# Patient Record
Sex: Female | Born: 1950 | Race: Black or African American | Hispanic: No | Marital: Single | Smoking: Never smoker
Health system: Southern US, Community
[De-identification: ages and names within clinical notes are randomized; demographics above are authoritative.]

## PROBLEM LIST (undated history)

## (undated) DIAGNOSIS — Z1509 Genetic susceptibility to other malignant neoplasm: Secondary | ICD-10-CM

## (undated) DIAGNOSIS — D649 Anemia, unspecified: Secondary | ICD-10-CM

## (undated) DIAGNOSIS — Z1501 Genetic susceptibility to malignant neoplasm of breast: Secondary | ICD-10-CM

## (undated) DIAGNOSIS — C50919 Malignant neoplasm of unspecified site of unspecified female breast: Secondary | ICD-10-CM

## (undated) DIAGNOSIS — E039 Hypothyroidism, unspecified: Secondary | ICD-10-CM

## (undated) DIAGNOSIS — C569 Malignant neoplasm of unspecified ovary: Secondary | ICD-10-CM

## (undated) DIAGNOSIS — I10 Essential (primary) hypertension: Secondary | ICD-10-CM

## (undated) HISTORY — DX: Malignant neoplasm of unspecified site of unspecified female breast: C50.919

## (undated) HISTORY — DX: Genetic susceptibility to malignant neoplasm of breast: Z15.09

## (undated) HISTORY — DX: Essential (primary) hypertension: I10

## (undated) HISTORY — DX: Genetic susceptibility to malignant neoplasm of breast: Z15.01

## (undated) HISTORY — PX: TUBAL LIGATION: SHX77

---

## 2002-07-21 DIAGNOSIS — C50919 Malignant neoplasm of unspecified site of unspecified female breast: Secondary | ICD-10-CM

## 2002-07-21 HISTORY — PX: OTHER SURGICAL HISTORY: SHX169

## 2002-07-21 HISTORY — DX: Malignant neoplasm of unspecified site of unspecified female breast: C50.919

## 2003-07-22 HISTORY — PX: PARATHYROIDECTOMY: SHX19

## 2003-12-29 ENCOUNTER — Encounter (HOSPITAL_COMMUNITY): Admission: RE | Admit: 2003-12-29 | Discharge: 2004-03-28 | Payer: Self-pay | Admitting: Hematology and Oncology

## 2004-10-11 ENCOUNTER — Ambulatory Visit: Admission: RE | Admit: 2004-10-11 | Discharge: 2005-01-09 | Payer: Self-pay | Admitting: Radiation Oncology

## 2005-03-06 ENCOUNTER — Ambulatory Visit: Payer: Self-pay | Admitting: Cardiology

## 2005-05-21 ENCOUNTER — Ambulatory Visit: Payer: Self-pay | Admitting: Cardiology

## 2006-03-27 ENCOUNTER — Encounter: Admission: RE | Admit: 2006-03-27 | Discharge: 2006-03-27 | Payer: Self-pay | Admitting: General Surgery

## 2006-08-17 ENCOUNTER — Ambulatory Visit (HOSPITAL_COMMUNITY): Admission: RE | Admit: 2006-08-17 | Discharge: 2006-08-18 | Payer: Self-pay | Admitting: General Surgery

## 2011-10-21 ENCOUNTER — Encounter: Payer: Self-pay | Admitting: Gastroenterology

## 2011-11-17 ENCOUNTER — Encounter: Payer: Self-pay | Admitting: Gastroenterology

## 2011-11-17 ENCOUNTER — Ambulatory Visit (AMBULATORY_SURGERY_CENTER): Payer: BC Managed Care – PPO | Admitting: *Deleted

## 2011-11-17 VITALS — Ht 62.0 in | Wt 152.3 lb

## 2011-11-17 DIAGNOSIS — Z1211 Encounter for screening for malignant neoplasm of colon: Secondary | ICD-10-CM

## 2011-11-17 MED ORDER — PEG-KCL-NACL-NASULF-NA ASC-C 100 G PO SOLR: ORAL | Status: DC

## 2011-12-01 ENCOUNTER — Ambulatory Visit (AMBULATORY_SURGERY_CENTER): Payer: BC Managed Care – PPO | Admitting: Gastroenterology

## 2011-12-01 ENCOUNTER — Encounter: Payer: Self-pay | Admitting: Gastroenterology

## 2011-12-01 VITALS — BP 164/47 | HR 55 | Temp 96.5°F | Resp 16 | Ht 62.0 in | Wt 152.0 lb

## 2011-12-01 DIAGNOSIS — Z1211 Encounter for screening for malignant neoplasm of colon: Secondary | ICD-10-CM

## 2011-12-01 DIAGNOSIS — D126 Benign neoplasm of colon, unspecified: Secondary | ICD-10-CM

## 2011-12-01 DIAGNOSIS — D129 Benign neoplasm of anus and anal canal: Secondary | ICD-10-CM

## 2011-12-01 DIAGNOSIS — D128 Benign neoplasm of rectum: Secondary | ICD-10-CM

## 2011-12-01 MED ORDER — SODIUM CHLORIDE 0.9 % IV SOLN: 500.0000 mL | INTRAVENOUS | Status: DC

## 2011-12-01 NOTE — Progress Notes (Signed)
Patient did not experience any of the following events: a burn prior to discharge; a fall within the facility; wrong site/side/patient/procedure/implant event; or a hospital transfer or hospital admission upon discharge from the facility. (G8907) Patient did not have preoperative order for IV antibiotic SSI prophylaxis. (G8918)  

## 2011-12-01 NOTE — Patient Instructions (Signed)
YOU HAD AN ENDOSCOPIC PROCEDURE TODAY AT THE [facility]: Refer to the procedure report that was given to you for any specific questions about what was found during the examination.  If the procedure report does not answer your questions, please call your gastroenterologist to clarify.  If you requested that your care partner not be given the details of your procedure findings, then the procedure report has been included in a sealed envelope for you to review at your convenience later.  YOU SHOULD EXPECT: Some feelings of bloating in the abdomen. Passage of more gas than usual.  Walking can help get rid of the air that was put into your GI tract during the procedure and reduce the bloating. If you had a lower endoscopy (such as a colonoscopy or flexible sigmoidoscopy) you may notice spotting of blood in your stool or on the toilet paper. If you underwent a bowel prep for your procedure, then you may not have a normal bowel movement for a few days.  DIET: Your first meal following the procedure should be a light meal and then it is ok to progress to your normal diet.  A half-sandwich or bowl of soup is an example of a good first meal.  Heavy or fried foods are harder to digest and may make you feel nauseous or bloated.  Likewise meals heavy in dairy and vegetables can cause extra gas to form and this can also increase the bloating.  Drink plenty of fluids but you should avoid alcoholic beverages for 24 hours.  ACTIVITY: Your care partner should take you home directly after the procedure.  You should plan to take it easy, moving slowly for the rest of the day.  You can resume normal activity the day after the procedure however you should NOT DRIVE or use heavy machinery for 24 hours (because of the sedation medicines used during the test).    SYMPTOMS TO REPORT IMMEDIATELY: A gastroenterologist can be reached at any hour.  During normal business hours, 8:30 AM to 5:00 PM Monday through Friday,  call [phone].  After hours and on weekends, please call the GI answering service at [phone] who will take a message and have the physician on call contact you.   Following lower endoscopy (colonoscopy or flexible sigmoidoscopy):  Excessive amounts of blood in the stool  Significant tenderness or worsening of abdominal pains  Swelling of the abdomen that is new, acute  Fever of 100F or higher  Following upper endoscopy (EGD)  Vomiting of blood or coffee ground material  New chest pain or pain under the shoulder blades  Painful or persistently difficult swallowing  New shortness of breath  Fever of 100F or higher  Black, tarry-looking stools  FOLLOW UP: If any biopsies were taken you will be contacted by phone or by letter within the next 1-3 weeks.  Call your gastroenterologist if you have not heard about the biopsies in 3 weeks.  Our staff will call the home number listed on your records the next business day following your procedure to check on you and address any questions or concerns that you may have at that time regarding the information given to you following your procedure. This is a courtesy call and so if there is no answer at the home number and we have not heard from you through the emergency physician on call, we will assume that you have returned to your regular daily activities without incident.  SIGNATURES/CONFIDENTIALITY: You and/or your care   partner have signed paperwork which will be entered into your electronic medical record.  These signatures attest to the fact that that the information above on your After Visit Summary has been reviewed and is understood.  Full responsibility of the confidentiality of this discharge information lies with you and/or your care-partner.   HANDOUT ON POLYPS 

## 2011-12-01 NOTE — Op Note (Signed)
[facility] [address] [place], [place]  [address]  COLONOSCOPY PROCEDURE REPORT  PATIENT:  Kathleen, Bond  MR#:  [id number] BIRTHDATE:  [birth year], 60 yrs. old  GENDER:  female ENDOSCOPIST:  Vania Rea. Jarold Motto, MD, [facility] REF. BY:  Colon Branch, M.D. PROCEDURE DATE:  12/01/2011 PROCEDURE:  Colonoscopy with biopsy ASA CLASS:  Class II INDICATIONS:  Routine Risk Screening MEDICATIONS:   propofol (Diprivan) 200 mg IV  DESCRIPTION OF PROCEDURE:   After the risks and benefits and of the procedure were explained, informed consent was obtained. Digital rectal exam was performed and revealed no abnormalities. The LB CF-[id number] endoscope was introduced through the anus and advanced to the cecum, which was identified by both the appendix and ileocecal valve.  The quality of the prep was excellent, using MoviPrep.  The instrument was then slowly withdrawn as the colon was fully examined. <<PROCEDUREIMAGES>>  FINDINGS:  A diminutive polyp was found in the rectum. COLD BIOPSY OF A SMALL RECTAL NODULEMDONE.  This was otherwise a normal examination of the colon.   Retroflexed views in the rectum revealed no abnormalities.    The scope was then withdrawn from the patient and the procedure completed.  COMPLICATIONS:  None ENDOSCOPIC IMPRESSION: 1) Diminutive polyp in the rectum 2) Otherwise normal examination R/O ADENOMA RECOMMENDATIONS: 1) Repeat colonoscopy in 5 years if polyp adenomatous; otherwise 10 years  REPEAT EXAM:  No  ______________________________ Vania Rea. Jarold Motto, MD, [facility]  CC:  n. eSIGNED:   Vania Rea. Queen Abbett at 12/01/2011 11:08 AM  Anette Riedel, [phone]

## 2011-12-02 ENCOUNTER — Telehealth: Payer: Self-pay | Admitting: *Deleted

## 2011-12-02 NOTE — Telephone Encounter (Signed)
  Follow up Call-  Call back number 12/01/2011  Post procedure Call Back phone  # [phone]  Permission to leave phone message Yes     Patient questions:  Do you have a fever, pain , or abdominal swelling? no Pain Score  0 *  Have you tolerated food without any problems? yes  Have you been able to return to your normal activities? yes  Do you have any questions about your discharge instructions: Diet   no Medications  no Follow up visit  no  Do you have questions or concerns about your Care? no  Actions: * If pain score is 4 or above: No action needed, pain <4.

## 2011-12-08 ENCOUNTER — Encounter: Payer: Self-pay | Admitting: Gastroenterology

## 2012-02-10 ENCOUNTER — Encounter: Payer: BC Managed Care – PPO | Admitting: Hematology and Oncology

## 2012-03-26 ENCOUNTER — Encounter (INDEPENDENT_AMBULATORY_CARE_PROVIDER_SITE_OTHER): Payer: BC Managed Care – PPO | Admitting: Hematology and Oncology

## 2012-03-26 DIAGNOSIS — I10 Essential (primary) hypertension: Secondary | ICD-10-CM

## 2012-03-26 DIAGNOSIS — C50419 Malignant neoplasm of upper-outer quadrant of unspecified female breast: Secondary | ICD-10-CM

## 2012-03-26 DIAGNOSIS — E213 Hyperparathyroidism, unspecified: Secondary | ICD-10-CM

## 2012-03-26 DIAGNOSIS — Z171 Estrogen receptor negative status [ER-]: Secondary | ICD-10-CM

## 2014-07-25 ENCOUNTER — Ambulatory Visit: Payer: 59 | Attending: Gynecologic Oncology | Admitting: Gynecologic Oncology

## 2014-07-25 ENCOUNTER — Encounter: Payer: Self-pay | Admitting: Gynecologic Oncology

## 2014-07-25 VITALS — BP 148/91 | HR 100 | Temp 98.1°F | Resp 18 | Ht 62.0 in | Wt 129.2 lb

## 2014-07-25 DIAGNOSIS — R599 Enlarged lymph nodes, unspecified: Secondary | ICD-10-CM | POA: Diagnosis not present

## 2014-07-25 DIAGNOSIS — R1900 Intra-abdominal and pelvic swelling, mass and lump, unspecified site: Secondary | ICD-10-CM | POA: Insufficient documentation

## 2014-07-25 DIAGNOSIS — J90 Pleural effusion, not elsewhere classified: Secondary | ICD-10-CM | POA: Insufficient documentation

## 2014-07-25 DIAGNOSIS — Z853 Personal history of malignant neoplasm of breast: Secondary | ICD-10-CM | POA: Insufficient documentation

## 2014-07-25 DIAGNOSIS — I10 Essential (primary) hypertension: Secondary | ICD-10-CM | POA: Diagnosis not present

## 2014-07-25 DIAGNOSIS — Z79899 Other long term (current) drug therapy: Secondary | ICD-10-CM | POA: Diagnosis not present

## 2014-07-25 DIAGNOSIS — R6881 Early satiety: Secondary | ICD-10-CM | POA: Insufficient documentation

## 2014-07-25 DIAGNOSIS — R180 Malignant ascites: Secondary | ICD-10-CM | POA: Insufficient documentation

## 2014-07-25 DIAGNOSIS — R188 Other ascites: Secondary | ICD-10-CM | POA: Diagnosis present

## 2014-07-25 DIAGNOSIS — K5900 Constipation, unspecified: Secondary | ICD-10-CM | POA: Diagnosis not present

## 2014-07-25 DIAGNOSIS — R140 Abdominal distension (gaseous): Secondary | ICD-10-CM | POA: Insufficient documentation

## 2014-07-25 DIAGNOSIS — E0590 Thyrotoxicosis, unspecified without thyrotoxic crisis or storm: Secondary | ICD-10-CM | POA: Diagnosis not present

## 2014-07-25 NOTE — Progress Notes (Signed)
Consult Note: Gyn-Onc  Consult was requested by Dr. Eula Fried for the evaluation of Kathleen Bond 64 y.o. female with ascites, pelvic mass and signs concerning for stage IV ovarian cancer.  CC:  Chief Complaint  Patient presents with  . pelvic mass    Assessment/Plan:  Ms. Kathleen Bond  is a 64 y.o.  year old with apparent advanced stage (IV) ovarian cancer. Her imaging is consistent with a stage IIIC or 4 disease with omental caking, large pelvic mass, para-aortic lymphadenopathy, pleural effusions and ascites, and a right 3 mm subpleural nodule. She is otherwise a very healthy woman with good nutritional status and excellent performance status. Of note she lives with her 75 year old blind mother. However she reports good social support.  I had a long discussion with Kathleen Bond regarding the nature of ovarian cancer, and recommended therapies. I discussed the importance of a combination therapy including both chemotherapy and surgery. Given the distribution of disease on imaging, and her excellent performance status, I believe she is a good candidate for upfront surgical debulking with the goal being to resect to microscopic disease or optimal disease (less than 1 cm). Postoperatively, if pathology confirms epithelial ovarian cancer, she would require adjuvant chemotherapy in the form of either IV or IP administration. She expressed understanding of this therapeutic course.  I discussed the anticipated surgery. I'm recommending an exploratory laparotomy, TAH, BSO, omentectomy, lymphadenectomy and tumor cytoreductive surgery. Based on the CT images and my physical examination I have a high degree of concern regarding rectal involvement. I discussed with Kathleen Bond the possibility of a low anterior resection of the sigmoid colon and rectum. I discussed that in most cases we are able to achieve a primary reanastomosis without formation of a GI stoma. However I discussed the possibility of seroma formation for  protection of an anastomosis if surgically it is the most prudent course. I discussed operative risks including  bleeding, infection, damage to internal organs (such as bladder,ureters, bowels), blood clot, reoperation and rehospitalization. If bowel surgery is necessary these risks are increased. I discussed that preoperative optimization with protein and high calorie supplemental drinks has been demonstrated to show a reduction in perioperative complications and however recommended that she take these in the preoperative period. Her albumin is within normal range at 3.6 as drawn on 07/21/2014.  I have ordered a paracentesis to relieve her symptoms of abdominal distention prior to surgery. We will send this fluid for cytology.  Given her personal history of breast cancer and now a likely GYN malignancy she is at high risk for carrying a deleterious mutation on BRCA and I am recommending that she undergo genetic screening for this. This can be delayed until the postoperative period during adjuvant therapy.  HPI: Kathleen Bond is a very pleasant 64 year old gravida 2 para 2 who is seen in consultation at the request of Dr.Qureshi for apparent advanced stage ovarian cancer. The patient was seen in the emergency room at [facility] on 07/21/2014 for symptoms of abdominal bloating, early satiety, pelvic pressure. She also has intermittent constipation and narrowing of stool caliber. She's noticed the symptoms becoming progressively worse since Thanksgiving 2015. A CT scan of the abdomen and pelvis was ordered at [facility] at this ER visit and demonstrated large volume ascites, small bilateral pleural effusions, a 3 mm subpleural nodule at the right lung base. Additionally there was a large omental cake, and retroperitoneal adenopathy in the para-aortic region with larger nodes on the left than the  right and the largest measuring 11 x 14 mm. There was a an elongated soft tissue mass  extending from the small bowel mesentery to the left upper quadrant. Additionally in the pelvis a large multicystic central pelvic mass abutting the sigmoid colon was seen. These findings were most consistent with advanced ovarian cancer.   Interval History: She denies rectal bleeding however does report intermittent constipation. She's been attempting to optimize her nutrition since receiving her diagnosis.  Current Meds:  Outpatient Encounter Prescriptions as of 07/25/2014  Medication Sig  . Calcium Carbonate-Vitamin D (CALCIUM + D PO) Take by mouth daily.  . furosemide (LASIX) 20 MG tablet Take 5 mg by mouth daily.  Marland Kitchen levothyroxine (SYNTHROID, LEVOTHROID) 125 MCG tablet Take 150 mcg by mouth daily.     Allergy: No Known Allergies  Social Hx:   History   Social History  . Marital Status: Divorced    Spouse Name: N/A    Number of Children: N/A  . Years of Education: N/A   Occupational History  . Not on file.   Social History Main Topics  . Smoking status: Never Smoker   . Smokeless tobacco: Never Used  . Alcohol Use: 4.2 oz/week    7 Cans of beer per week  . Drug Use: No  . Sexual Activity: Not on file   Other Topics Concern  . Not on file   Social History Narrative    Past Surgical Hx:  Past Surgical History  Procedure Laterality Date  . Left breast lumpectomy  2004  . Parathyroidectomy  2005  . Tubal ligation Bilateral     Past Medical Hx:  Past Medical History  Diagnosis Date  . Arthritis   . Hypertension   . Breast cancer 2004    left breast lumpectomy  . Hyperthyroidism     Past Gynecological History:  G2P2, SVD  No LMP recorded. Patient is postmenopausal.  Family Hx:  Family History  Problem Relation Age of Onset  . Colon cancer Neg Hx   . Stomach cancer Neg Hx     Review of Systems:  Constitutional  Feels well,    ENT Normal appearing ears and nares bilaterally Skin/Breast  No rash, sores, jaundice, itching, dryness Cardiovascular  No  chest pain, shortness of breath, or edema  Pulmonary  No cough or wheeze.  Gastro Intestinal  No nausea, vomitting, or diarrhoea. No bright red blood per rectum, no abdominal pain. +change in bowel movement. +constipation.  + early satiety. Genito Urinary  No frequency, urgency, dysuria, see HPI Musculo Skeletal  No myalgia, arthralgia, joint swelling or pain  Neurologic  No weakness, numbness, change in gait,  Psychology  No depression, anxiety, insomnia.   Vitals:  Blood pressure 148/91, pulse 100, temperature 98.1 F (36.7 C), temperature source Oral, resp. rate 18, height _0  (1.575 m), weight 129 lb 3.2 oz (58.605 kg).  Physical Exam: WD in NAD Neck  Supple NROM, without any enlargements.  Lymph Node Survey No cervical supraclavicular or inguinal adenopathy Cardiovascular  Pulse normal rate, regularity and rhythm. S1 and S2 normal.  Lungs  Clear to auscultation bilateraly, without wheezes/crackles/rhonchi. Decreased breath sounds bibasally consistent with pleural effusions. Skin  No rash/lesions/breakdown  Psychiatry  Alert and oriented to person, place, and time  Abdomen  Normoactive bowel sounds, abdomen soft, distended with fluid wave, non-tender and nonobese without evidence of hernia.  Back No CVA tenderness Genito Urinary  Vulva/vagina: Normal external female genitalia.  No lesions. No discharge or bleeding.  Bladder/urethra:  No lesions or masses, well supported bladder  Vagina: normal and atrophic in appearance.  Cervix: Normal appearing, no lesions.  Uterus: Small, mobile, cul de sac nodularity and fluid appreciated.  Adnexa: bilateral nodular masses. Rectal  Good tone, + cul de sac nodularity and close approximation between tumor and anterior rectal wall.  Extremities  No bilateral cyanosis, clubbing or edema.   [name], MD   07/25/2014, 2:55 PM

## 2014-07-25 NOTE — Patient Instructions (Addendum)
Preparing for your Surgery  Plan for surgery on Jan 19 with Dr. Denman George.  Pre-operative Testing -You will receive a phone call from presurgical testing at [facility] to arrange for a pre-operative testing appointment before your surgery.  This appointment normally occurs one to two weeks before your scheduled surgery.   -Bring your insurance card, copy of an advanced directive if applicable, medication list  -At that visit, you will be asked to sign a consent for a possible blood transfusion in case a transfusion becomes necessary during surgery.  The need for a blood transfusion is rare but having consent is a necessary part of your care.     -You should not be taking blood thinners or aspirin at least ten days prior to surgery unless instructed by your surgeon.  Day Before Surgery at [facility] will be asked to take in only clear liquids the day before surgery.  Examples of clear liquids include broths, jello, and clear juices.  Drink one bottle of magnesium citrate the evening before surgery. You will be advised to have nothing to eat or drink after midnight the evening before.    Your role in recovery Your role is to become active as soon as directed by your doctor, while still giving yourself time to heal.  Rest when you feel tired. You will be asked to do the following in order to speed your recovery:  - Cough and breathe deeply. This helps toclear and expand your lungs and can prevent pneumonia. You may be given a spirometer to practice deep breathing. A staff member will show you how to use the spirometer. - Do mild physical activity. Walking or moving your legs help your circulation and body functions return to normal. A staff member will help you when you try to walk and will provide you with simple exercises. Do not try to get up or walk alone the first time. - Actively manage your pain. Managing your pain lets you move in comfort. We will ask you to rate your  pain on a scale of zero to 10. It is your responsibility to tell your doctor or nurse where and how much you hurt so your pain can be treated.  Special Considerations -If you are diabetic, you may be placed on insulin after surgery to have closer control over your blood sugars to promote healing and recovery.  This does not mean that you will be discharged on insulin.  If applicable, your oral antidiabetics will be resumed when you are tolerating a solid diet.  -Your final pathology results from surgery should be available by the Friday after surgery and the results will be relayed to you when available.  Paracentesis Thursday 07/27/14 at [facility] (main hospital) at 9:45 am.   Paracentesis is a procedure used to remove excess fluid from the belly (abdomen). Excess fluid in the belly is called ascites. Excess fluid can be the result of certain conditions, such as infection, inflammation, abdominal injury, heart failure, chronic scarring of the liver (cirrhosis), or cancer. The excess fluid is removed using a needle inserted through the skin and tissue into the abdomen.  A paracentesis may be done to:  Determine the cause of the excess fluid through examination of the fluid.  Relieve symptoms of shortness of breath or pain caused by the excess fluid.  Determine presence of bleeding after an abdominal injury. LET YOUR CAREGIVERS KNOW ABOUT:  Allergies.  Medications taken including herbs, eye drops, over-the-counter medications, and creams.  Use of steroids (by mouth or creams).  Previous problems with anesthetics or numbing medicine.  Possibility of pregnancy, if this applies.  History of blood clots (thrombophlebitis).  History of bleeding or blood problems.  Previous surgery.  Other health problems. RISKS AND COMPLICATIONS  Injury to an abdominal organ, such as the bowel (large intestine), liver, spleen, or bladder.  Possible infection.  Bleeding.  Low blood pressure  (hypotension). BEFORE THE PROCEDURE This is a procedure that can be done as an outpatient. Confirm the time that you need to arrive for your procedure. A blood sample may be done to determine your blood clotting time. The presence of a severe bleeding disorder (coagulopathy) which cannot be promptly corrected may make this procedure inadvisable. You may be asked to urinate. PROCEDURE The procedure will take about 30 minutes. This time will vary depending on the amount of fluid that is removed. You may be asked to lie on your back with your head elevated. An area on your abdomen will be cleansed. A numbing medicine may then be injected (local anesthesia) into the skin and tissue. A needle is inserted through your abdominal skin and tissues until it is positioned in your abdomen. You may feel pressure or slight pain as the needle is positioned into the abdomen. Fluid is removed from the abdomen through the needle. Tell your caregiver if you feel dizzy or lightheaded. The needle is withdrawn once the desired amount of fluid has been removed. A sample of the fluid may be sent for examination.  AFTER THE PROCEDURE Your recovery will be assessed and monitored. If there are no problems, as an outpatient, you should be able to go home shortly after the procedure. There may be a very limited amount of clear fluid draining from the needle insertion site over the next 2 days. Confirm with your caregiver as to the expected amount of drainage. Obtaining the Test Results It is your responsibility to obtain your test results. Do not assume everything is normal if you have not heard from your caregiver or the medical facility. It is important for you to follow up on all of your test results. HOME CARE INSTRUCTIONS   You may resume normal diet and activities as directed or allowed.  Only take over-the-counter or prescription medicines for pain, discomfort, or fever as directed by your caregiver. SEEK IMMEDIATE MEDICAL  CARE IF:  You develop shortness of breath or chest pain.  You develop increasing pain, discomfort, or swelling in your abdomen.  You develop new drainage or pus coming from site where fluid was removed.  You develop swelling or increased redness from site where fluid was removed.  You develop an unexplained temperature of 102 F (38.9 C) or above. Document Released: 01/20/2005 Document Revised: 09/29/2011 Document Reviewed: 02/26/2009 [facility] Patient Information 2015 [place], [place]. This information is not intended to replace advice given to you by your health care provider. Make sure you discuss any questions you have with your health care provider.

## 2014-07-27 ENCOUNTER — Ambulatory Visit (HOSPITAL_COMMUNITY)
Admission: RE | Admit: 2014-07-27 | Discharge: 2014-07-27 | Disposition: A | Discharge status: Home / Self Care | Payer: 59 | Source: Ambulatory Visit | Attending: Gynecologic Oncology | Admitting: Gynecologic Oncology

## 2014-07-27 DIAGNOSIS — R1900 Intra-abdominal and pelvic swelling, mass and lump, unspecified site: Secondary | ICD-10-CM | POA: Diagnosis present

## 2014-07-27 DIAGNOSIS — K668 Other specified disorders of peritoneum: Secondary | ICD-10-CM | POA: Insufficient documentation

## 2014-07-27 DIAGNOSIS — R140 Abdominal distension (gaseous): Secondary | ICD-10-CM | POA: Insufficient documentation

## 2014-07-27 DIAGNOSIS — R599 Enlarged lymph nodes, unspecified: Secondary | ICD-10-CM | POA: Insufficient documentation

## 2014-07-27 DIAGNOSIS — R188 Other ascites: Secondary | ICD-10-CM | POA: Insufficient documentation

## 2014-07-27 NOTE — Progress Notes (Signed)
Please put orders in Epic surgery 08-08-14 pre op 08-02-14 Thanks

## 2014-07-27 NOTE — Procedures (Signed)
US guided diagnostic/therapeutic paracentesis performed yielding 3.4 liters yellow fluid. A portion of the fluid was sent to the lab for cytology. No immediate complications.

## 2014-08-01 ENCOUNTER — Encounter (HOSPITAL_COMMUNITY): Payer: Self-pay

## 2014-08-01 NOTE — Patient Instructions (Addendum)
Kathleen Bond  08/01/2014   Your procedure is scheduled on: 08/08/2014    Report to [facility] Main  Entrance and follow signs to               [place] at       1045 AM.  Call this number if you have problems the morning of surgery [phone]   Remember: Clear liquid diet 24 hours prior to surgery beginning 08/07/2014 am.  One bottle of Mag Citrate the afternoon before surgery.    Do not eat food or drink liquids :After Midnight.     Take these medicines the morning of surgery with A SIP OF WATER: synthroid                                You may not have any metal on your body including hair pins and              piercings  Do not wear jewelry, make-up, lotions, powders or perfumes.             Do not wear nail polish.  Do not shave  48 hours prior to surgery.              Do not bring valuables to the hospital. [place].  Contacts, dentures or bridgework may not be worn into surgery.  Leave suitcase in the car. After surgery it may be brought to your room.         Special Instructions: coughing and deep breathing exercises, leg exercises               Please read over the following fact sheets you were given: _____________________________________________________________________                CLEAR LIQUID DIET   Foods Allowed                                                                     Foods Excluded  Coffee and tea, regular and decaf                             liquids that you cannot  Plain Jell-O in any flavor                                             see through such as: Fruit ices (not with fruit pulp)                                     milk, soups, orange juice  Iced Popsicles  All solid food Carbonated beverages, regular and diet                                    Cranberry, grape and apple juices Sports drinks like Gatorade Lightly  seasoned clear broth or consume(fat free) Sugar, honey syrup  Sample Menu Breakfast                                Lunch                                     Supper Cranberry juice                    Beef broth                            Chicken broth Jell-O                                     Grape juice                           Apple juice Coffee or tea                        Jell-O                                      Popsicle                                                Coffee or tea                        Coffee or tea  _____________________________________________________________________  [facility] - Preparing for Surgery Before surgery, you can play an important role.  Because skin is not sterile, your skin needs to be as free of germs as possible.  You can reduce the number of germs on your skin by washing with CHG (chlorahexidine gluconate) soap before surgery.  CHG is an antiseptic cleaner which kills germs and bonds with the skin to continue killing germs even after washing. Please DO NOT use if you have an allergy to CHG or antibacterial soaps.  If your skin becomes reddened/irritated stop using the CHG and inform your nurse when you arrive at Short Stay. Do not shave (including legs and underarms) for at least 48 hours prior to the first CHG shower.  You may shave your face/neck. Please follow these instructions carefully:  1.  Shower with CHG Soap the night before surgery and the  morning of Surgery.  2.  If you choose to wash your hair, wash your hair first as usual with your  normal  shampoo.  3.  After you shampoo, rinse your hair and body thoroughly to remove the  shampoo.  4.  Use CHG as you would any other liquid soap.  You can apply chg directly  to the skin and wash                       Gently with a scrungie or clean washcloth.  5.  Apply the CHG Soap to your body ONLY FROM THE NECK DOWN.   Do not use on face/ open                            Wound or open sores. Avoid contact with eyes, ears mouth and genitals (private parts).                       Wash face,  Genitals (private parts) with your normal soap.             6.  Wash thoroughly, paying special attention to the area where your surgery  will be performed.  7.  Thoroughly rinse your body with warm water from the neck down.  8.  DO NOT shower/wash with your normal soap after using and rinsing off  the CHG Soap.                9.  Pat yourself dry with a clean towel.            10.  Wear clean pajamas.            11.  Place clean sheets on your bed the night of your first shower and do not  sleep with pets. Day of Surgery : Do not apply any lotions/deodorants the morning of surgery.  Please wear clean clothes to the hospital/surgery center.  FAILURE TO FOLLOW THESE INSTRUCTIONS MAY RESULT IN THE CANCELLATION OF YOUR SURGERY PATIENT SIGNATURE_________________________________  NURSE SIGNATURE__________________________________  ________________________________________________________________________  WHAT IS A BLOOD TRANSFUSION? Blood Transfusion Information  A transfusion is the replacement of blood or some of its parts. Blood is made up of multiple cells which provide different functions.  Red blood cells carry oxygen and are used for blood loss replacement.  White blood cells fight against infection.  Platelets control bleeding.  Plasma helps clot blood.  Other blood products are available for specialized needs, such as hemophilia or other clotting disorders. BEFORE THE TRANSFUSION  Who gives blood for transfusions?   Healthy volunteers who are fully evaluated to make sure their blood is safe. This is blood bank blood. Transfusion therapy is the safest it has ever been in the practice of medicine. Before blood is taken from a donor, a complete history is taken to make sure that person has no history of diseases nor engages in risky social behavior (examples are  intravenous drug use or sexual activity with multiple partners). The donor's travel history is screened to minimize risk of transmitting infections, such as malaria. The donated blood is tested for signs of infectious diseases, such as HIV and hepatitis. The blood is then tested to be sure it is compatible with you in order to minimize the chance of a transfusion reaction. If you or a relative donates blood, this is often done in anticipation of surgery and is not appropriate for emergency situations. It takes many days to process the donated blood. RISKS AND COMPLICATIONS Although transfusion therapy is very safe and saves many lives, the main dangers of transfusion include:  1. Getting an infectious disease. 2. Developing a transfusion reaction.  This is an allergic reaction to something in the blood you were given. Every precaution is taken to prevent this. The decision to have a blood transfusion has been considered carefully by your caregiver before blood is given. Blood is not given unless the benefits outweigh the risks. AFTER THE TRANSFUSION  Right after receiving a blood transfusion, you will usually feel much better and more energetic. This is especially true if your red blood cells have gotten low (anemic). The transfusion raises the level of the red blood cells which carry oxygen, and this usually causes an energy increase.  The nurse administering the transfusion will monitor you carefully for complications. HOME CARE INSTRUCTIONS  No special instructions are needed after a transfusion. You may find your energy is better. Speak with your caregiver about any limitations on activity for underlying diseases you may have. SEEK MEDICAL CARE IF:   Your condition is not improving after your transfusion.  You develop redness or irritation at the intravenous (IV) site. SEEK IMMEDIATE MEDICAL CARE IF:  Any of the following symptoms occur over the next 12 hours:  Shaking chills.  You have a  temperature by mouth above 102 F (38.9 C), not controlled by medicine.  Chest, back, or muscle pain.  People around you feel you are not acting correctly or are confused.  Shortness of breath or difficulty breathing.  Dizziness and fainting.  You get a rash or develop hives.  You have a decrease in urine output.  Your urine turns a dark color or changes to pink, red, or brown. Any of the following symptoms occur over the next 10 days:  You have a temperature by mouth above 102 F (38.9 C), not controlled by medicine.  Shortness of breath.  Weakness after normal activity.  The white part of the eye turns yellow (jaundice).  You have a decrease in the amount of urine or are urinating less often.  Your urine turns a dark color or changes to pink, red, or brown. Document Released: 07/04/2000 Document Revised: 09/29/2011 Document Reviewed: 02/21/2008 ExitCare Patient Information 2014 [place].  _______________________________________________________________________  Incentive Spirometer  An incentive spirometer is a tool that can help keep your lungs clear and active. This tool measures how well you are filling your lungs with each breath. Taking long deep breaths may help reverse or decrease the chance of developing breathing (pulmonary) problems (especially infection) following:  A long period of time when you are unable to move or be active. BEFORE THE PROCEDURE   If the spirometer includes an indicator to show your best effort, your nurse or respiratory therapist will set it to a desired goal.  If possible, sit up straight or lean slightly forward. Try not to slouch.  Hold the incentive spirometer in an upright position. INSTRUCTIONS FOR USE  3. Sit on the edge of your bed if possible, or sit up as far as you can in bed or on a chair. 4. Hold the incentive spirometer in an upright position. 5. Breathe out normally. 6. Place the mouthpiece in your mouth and  seal your lips tightly around it. 7. Breathe in slowly and as deeply as possible, raising the piston or the ball toward the top of the column. 8. Hold your breath for 3-5 seconds or for as long as possible. Allow the piston or ball to fall to the bottom of the column. 9. Remove the mouthpiece from your mouth and breathe out normally. 10. Rest for a few seconds and repeat Steps  1 through 7 at least 10 times every 1-2 hours when you are awake. Take your time and take a few normal breaths between deep breaths. 11. The spirometer may include an indicator to show your best effort. Use the indicator as a goal to work toward during each repetition. 12. After each set of 10 deep breaths, practice coughing to be sure your lungs are clear. If you have an incision (the cut made at the time of surgery), support your incision when coughing by placing a pillow or rolled up towels firmly against it. Once you are able to get out of bed, walk around indoors and cough well. You may stop using the incentive spirometer when instructed by your caregiver.  RISKS AND COMPLICATIONS  Take your time so you do not get dizzy or light-headed.  If you are in pain, you may need to take or ask for pain medication before doing incentive spirometry. It is harder to take a deep breath if you are having pain. AFTER USE  Rest and breathe slowly and easily.  It can be helpful to keep track of a log of your progress. Your caregiver can provide you with a simple table to help with this. If you are using the spirometer at home, follow these instructions: [place] IF:   You are having difficultly using the spirometer.  You have trouble using the spirometer as often as instructed.  Your pain medication is not giving enough relief while using the spirometer.  You develop fever of 100.5 F (38.1 C) or higher. SEEK IMMEDIATE MEDICAL CARE IF:   You cough up bloody sputum that had not been present before.  You develop  fever of 102 F (38.9 C) or greater.  You develop worsening pain at or near the incision site. MAKE SURE YOU:   Understand these instructions.  Will watch your condition.  Will get help right away if you are not doing well or get worse. Document Released: 11/17/2006 Document Revised: 09/29/2011 Document Reviewed: 01/18/2007 [facility] Patient Information 2014 [place], [place].   ________________________________________________________________________

## 2014-08-02 ENCOUNTER — Encounter (HOSPITAL_COMMUNITY)
Admission: RE | Admit: 2014-08-02 | Discharge: 2014-08-02 | Disposition: A | Discharge status: Home / Self Care | Payer: 59 | Source: Ambulatory Visit | Attending: Gynecologic Oncology | Admitting: Gynecologic Oncology

## 2014-08-02 ENCOUNTER — Encounter (HOSPITAL_COMMUNITY): Payer: Self-pay

## 2014-08-02 ENCOUNTER — Ambulatory Visit (HOSPITAL_COMMUNITY)
Admission: RE | Admit: 2014-08-02 | Discharge: 2014-08-02 | Disposition: A | Discharge status: Home / Self Care | Payer: 59 | Source: Ambulatory Visit | Attending: Gynecologic Oncology | Admitting: Gynecologic Oncology

## 2014-08-02 ENCOUNTER — Telehealth: Payer: Self-pay | Admitting: Gynecologic Oncology

## 2014-08-02 DIAGNOSIS — C569 Malignant neoplasm of unspecified ovary: Secondary | ICD-10-CM | POA: Insufficient documentation

## 2014-08-02 DIAGNOSIS — I444 Left anterior fascicular block: Secondary | ICD-10-CM | POA: Diagnosis not present

## 2014-08-02 DIAGNOSIS — Z01818 Encounter for other preprocedural examination: Secondary | ICD-10-CM

## 2014-08-02 DIAGNOSIS — J90 Pleural effusion, not elsewhere classified: Secondary | ICD-10-CM | POA: Diagnosis not present

## 2014-08-02 DIAGNOSIS — I454 Nonspecific intraventricular block: Secondary | ICD-10-CM | POA: Insufficient documentation

## 2014-08-02 DIAGNOSIS — I10 Essential (primary) hypertension: Secondary | ICD-10-CM | POA: Diagnosis not present

## 2014-08-02 DIAGNOSIS — I452 Bifascicular block: Secondary | ICD-10-CM | POA: Insufficient documentation

## 2014-08-02 HISTORY — DX: Hypothyroidism, unspecified: E03.9

## 2014-08-02 LAB — COMPREHENSIVE METABOLIC PANEL
ALT: 21 U/L (ref 0–35)
AST: 30 U/L (ref 0–37)
Albumin: 3.7 g/dL (ref 3.5–5.2)
Alkaline Phosphatase: 57 U/L (ref 39–117)
Anion gap: 9 (ref 5–15)
BUN: 14 mg/dL (ref 6–23)
CALCIUM: 9.2 mg/dL (ref 8.4–10.5)
CO2: 27 mmol/L (ref 19–32)
Chloride: 98 mEq/L (ref 96–112)
Creatinine, Ser: 0.66 mg/dL (ref 0.50–1.10)
GLUCOSE: 103 mg/dL — AB (ref 70–99)
Potassium: 4.3 mmol/L (ref 3.5–5.1)
Sodium: 134 mmol/L — ABNORMAL LOW (ref 135–145)
TOTAL PROTEIN: 7.4 g/dL (ref 6.0–8.3)
Total Bilirubin: 0.5 mg/dL (ref 0.3–1.2)

## 2014-08-02 LAB — URINALYSIS, ROUTINE W REFLEX MICROSCOPIC
GLUCOSE, UA: NEGATIVE mg/dL
Hgb urine dipstick: NEGATIVE
Ketones, ur: NEGATIVE mg/dL
LEUKOCYTES UA: NEGATIVE
Nitrite: NEGATIVE
PROTEIN: NEGATIVE mg/dL
Specific Gravity, Urine: 1.03 (ref 1.005–1.030)
UROBILINOGEN UA: 0.2 mg/dL (ref 0.0–1.0)
pH: 5.5 (ref 5.0–8.0)

## 2014-08-02 LAB — CBC WITH DIFFERENTIAL/PLATELET
BASOS ABS: 0 10*3/uL (ref 0.0–0.1)
BASOS PCT: 0 % (ref 0–1)
Eosinophils Absolute: 0 10*3/uL (ref 0.0–0.7)
Eosinophils Relative: 0 % (ref 0–5)
HCT: 40.5 % (ref 36.0–46.0)
Hemoglobin: 13.2 g/dL (ref 12.0–15.0)
Lymphocytes Relative: 26 % (ref 12–46)
Lymphs Abs: 1.2 10*3/uL (ref 0.7–4.0)
MCH: 28.1 pg (ref 26.0–34.0)
MCHC: 32.6 g/dL (ref 30.0–36.0)
MCV: 86.4 fL (ref 78.0–100.0)
Monocytes Absolute: 0.5 10*3/uL (ref 0.1–1.0)
Monocytes Relative: 10 % (ref 3–12)
Neutro Abs: 2.9 10*3/uL (ref 1.7–7.7)
Neutrophils Relative %: 64 % (ref 43–77)
Platelets: 447 10*3/uL — ABNORMAL HIGH (ref 150–400)
RBC: 4.69 MIL/uL (ref 3.87–5.11)
RDW: 13.6 % (ref 11.5–15.5)
WBC: 4.5 10*3/uL (ref 4.0–10.5)

## 2014-08-02 LAB — ABO/RH: ABO/RH(D): B POS

## 2014-08-02 NOTE — Progress Notes (Addendum)
Dr Marcell Barlow ( anesthesia ) made aware of hypertensive history .  No previous cardiac history. No EKG that patient remembers.  Will call Dr Otelia Sergeant in [place] , [place] to see if old ekg available.  Vital signs within normal limits.  Onoly on hypertensive med and Synthroid.  Patient denies any chest pain or shortness of breath.  Patient will need cardiac clearance prior to surgery per Dr Marcell Barlow.  Spoke with Joylene John, NP at [facility] and made her aware that cardiac clearance needed.    Patient was informed prior to leaving PST that Joylene John, NP would be in touch with patient .  No old EKG available from PCP- Dr Otelia Sergeant .  Left message for Joylene John, NP that no old EKG available.

## 2014-08-02 NOTE — Progress Notes (Signed)
CXR results done 08/02/2014 faxed via EPIC to Dr Denman George and Joylene John, NP.  Also called and left message for Joylene John at [phone] that CXR results from 08/02/2014 had been faxed to both her and Dr Denman George via [facility].

## 2014-08-02 NOTE — Telephone Encounter (Signed)
Called and spoke with patient about the need to move her surgery to a later date due to the necessity for cardiac evaluation pre-op per anesthesia.  New patient appointment scheduled for Jan 21 with Dr. Acie Fredrickson at [facility] cardiology at 11:00am.  Address [place] she would be contacted with a new surgical date.  Advised to call for any questions or concerns.

## 2014-08-03 LAB — CA 125: CA 125: 1162 U/mL — ABNORMAL HIGH (ref <35)

## 2014-08-03 NOTE — Progress Notes (Signed)
Final EKG in EPIC done 08/02/14.

## 2014-08-04 ENCOUNTER — Telehealth: Payer: Self-pay | Admitting: Gynecologic Oncology

## 2014-08-04 NOTE — Telephone Encounter (Signed)
Called patient and informed her that her surgery had been rescheduled to Jan 26.  Informed if further testing was needed from a cardiac standpoint, her surgery would need to be rescheduled to a later date.  Verbalizing understanding.  Informed of chest xray results.  Denies chest pain, dyspnea, cough.  Stating she walked outside yesterday with no difficulties.  Reportable signs and symptoms reviewed.

## 2014-08-07 ENCOUNTER — Telehealth: Payer: Self-pay | Admitting: *Deleted

## 2014-08-07 DIAGNOSIS — R180 Malignant ascites: Secondary | ICD-10-CM

## 2014-08-07 DIAGNOSIS — R1900 Intra-abdominal and pelvic swelling, mass and lump, unspecified site: Secondary | ICD-10-CM

## 2014-08-07 NOTE — Telephone Encounter (Signed)
Patient scheduled for thoracentesis by Dr. Denman George. Called patient with appt for 1/22 at 2pm at [facility]. Patient agreeable to arrive at 1:45pm.

## 2014-08-08 LAB — TYPE AND SCREEN
ABO/RH(D): B POS
Antibody Screen: NEGATIVE

## 2014-08-10 ENCOUNTER — Encounter: Payer: Self-pay | Admitting: Cardiovascular Disease

## 2014-08-10 ENCOUNTER — Ambulatory Visit (INDEPENDENT_AMBULATORY_CARE_PROVIDER_SITE_OTHER): Payer: 59 | Admitting: Cardiovascular Disease

## 2014-08-10 ENCOUNTER — Telehealth: Payer: Self-pay | Admitting: Gynecologic Oncology

## 2014-08-10 VITALS — BP 112/68 | HR 96 | Ht 62.0 in | Wt 122.1 lb

## 2014-08-10 DIAGNOSIS — I4510 Unspecified right bundle-branch block: Secondary | ICD-10-CM | POA: Insufficient documentation

## 2014-08-10 NOTE — Progress Notes (Signed)
Cardiac notes from Dr. Acie Fredrickson on chart . No further action needed per note.

## 2014-08-10 NOTE — Progress Notes (Signed)
Cardiology Office Note   Date:  08/10/2014   ID:  Kathleen Bond, DOB 1950-10-07, MRN [id number]  PCP:  Cleda Mccreedy, MD  Cardiologist:   Thayer Headings, MD   Chief Complaint  Patient presents with  . New Patient    RT BBB and Lt anterior block      History of Present Illness: Kathleen Bond is a 64 y.o. female who presents for  RBBB with LAFB.   Hs of HTN  Breast cancer  Hypothyroidism Ovarian cancer    Kathleen Bond is here at the request of the anesthesiologist for eval of RBBB and left ant. Fascicular block.   She is here for pre-op eval.  She was diagnosed with ovarian cancer 3 weeks ago. She has had a paracentesis and is scheduled for a thoracentesis tomorrow.   She has been farily health prior to her diagnosis of ovarian cancer. In mid November, she started having  difficulty eating and swelling in her legs and abd.  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   Non smoker, Wine occasionally Lives in [place], [place]   Past Medical History  Diagnosis Date  . Hypertension   . Breast cancer 2004    left breast lumpectomy  . Hypothyroidism     Past Surgical History  Procedure Laterality Date  . Left breast lumpectomy  2004  . Parathyroidectomy  2005  . Tubal ligation Bilateral      Current Outpatient Prescriptions  Medication Sig Dispense Refill  . enalapril (VASOTEC) 10 MG tablet Take 10 mg by mouth every morning.    . furosemide (LASIX) 20 MG tablet Take 20 mg by mouth at bedtime.    Marland Kitchen levothyroxine (SYNTHROID, LEVOTHROID) 150 MCG tablet Take 150 mcg by mouth daily before breakfast.    . Vitamin D, Ergocalciferol, (DRISDOL) 50000 UNITS CAPS capsule Take 50,000 Units by mouth every 7 (seven) days.     No current facility-administered medications for this visit.    Allergies:   Review of patient's allergies indicates no known allergies.    Social History:  The patient  reports that she has never smoked. She has never used  smokeless tobacco. She reports that she drinks alcohol. She reports that she does not use illicit drugs.   Family History:  The patient's family history includes Asthma in her father; Hypertension in her mother. There is no history of Colon cancer or Stomach cancer.    ROS:  Please see the history of present illness.   Otherwise, review of systems are positive for none.   All other systems are reviewed and negative.    PHYSICAL EXAM: VS:  BP 112/68 mmHg  Pulse 96  Ht 5\' 2"  (1.575 m)  Wt 122 lb 1.9 oz (55.393 kg)  BMI 22.33 kg/m2  SpO2 92% , BMI Body mass index is 22.33 kg/(m^2). GEN: Well nourished, well developed, in no acute distress HEENT: normal Neck: no JVD, carotid bruits, or masses Cardiac: RRR; no murmurs, rubs, or gallops,no edema  Respiratory:  clear to auscultation bilaterally, normal work of breathing GI: firm, distended, + bS MS: no deformity or atrophy Skin: warm and dry, no rash Neuro:  Strength and sensation are intact Psych: euthymic mood, full affect   EKG:  EKG is not ordered today. The ekg from several weeks ago  demonstrates  - RBBB with LAHB    Recent Labs: 08/02/2014: ALT 21; BUN 14; Creatinine 0.66; Hemoglobin 13.2; Platelets 447*;  Potassium 4.3; Sodium 134*    Lipid Panel No results found for: CHOL, TRIG, HDL, CHOLHDL, VLDL, LDLCALC, LDLDIRECT    Wt Readings from Last 3 Encounters:  08/10/14 122 lb 1.9 oz (55.393 kg)  07/25/14 129 lb 3.2 oz (58.605 kg)  12/01/11 152 lb (68.947 kg)      Other studies Reviewed: Additional studies/ records that were reviewed today include: . Review of the above records demonstrates:    ASSESSMENT AND PLAN:  1.  Pre op visit for ovarian cancer surgery:   Patient presents for preoperative evaluation prior ovarian cancer surgery. She was noted to have a right bundle branch block with a left anterior fascicular block on her EKG. She doesn't have any symptoms of chest pain. She does have some shortness of breath  which is more likely related to her pleural effusions. She previously has been in good health.  A right bundle branch block does not require any further evaluation. She has a normal cardiac exam. She has no signs or symptoms of CHF.  We will see her on an as-needed basis.   Current medicines are reviewed at length with the patient today.  The patient does not have concerns regarding medicines.  The following changes have been made:  no change  Labs/ tests ordered today include:  No orders of the defined types were placed in this encounter.     Disposition:   FU with me  As needed .     Signed, Gwyneth Fernandez, Wonda Cheng, MD  08/10/2014 11:45 AM    Kathleen Bond, [place], [place]  [address] Phone: [phone]; Fax: [phone]

## 2014-08-10 NOTE — Patient Instructions (Signed)
Your physician recommends that you continue on your current medications as directed. Please refer to the Current Medication list given to you today.  Your physician recommends that you schedule a follow-up appointment in: as needed with Dr. Nahser  

## 2014-08-10 NOTE — Telephone Encounter (Signed)
Attempted to call patient to inform her that we received her cardiac clearance and will plan to proceed with surgery on Tues., Jan 26.  Message left with family member to have patient call the office.

## 2014-08-11 ENCOUNTER — Ambulatory Visit (HOSPITAL_COMMUNITY)
Admission: RE | Admit: 2014-08-11 | Discharge: 2014-08-11 | Disposition: A | Discharge status: Home / Self Care | Payer: 59 | Source: Ambulatory Visit | Attending: Interventional Radiology | Admitting: Interventional Radiology

## 2014-08-11 ENCOUNTER — Ambulatory Visit (HOSPITAL_COMMUNITY)
Admission: RE | Admit: 2014-08-11 | Discharge: 2014-08-11 | Disposition: A | Discharge status: Home / Self Care | Payer: 59 | Source: Ambulatory Visit | Attending: Gynecologic Oncology | Admitting: Gynecologic Oncology

## 2014-08-11 DIAGNOSIS — R1900 Intra-abdominal and pelvic swelling, mass and lump, unspecified site: Secondary | ICD-10-CM

## 2014-08-11 DIAGNOSIS — Z9889 Other specified postprocedural states: Secondary | ICD-10-CM

## 2014-08-11 DIAGNOSIS — C569 Malignant neoplasm of unspecified ovary: Secondary | ICD-10-CM | POA: Insufficient documentation

## 2014-08-11 DIAGNOSIS — J90 Pleural effusion, not elsewhere classified: Secondary | ICD-10-CM | POA: Diagnosis not present

## 2014-08-11 DIAGNOSIS — R180 Malignant ascites: Secondary | ICD-10-CM

## 2014-08-14 NOTE — Progress Notes (Signed)
Patient notified of time change. Instructed to arrive to [facility] Stay at 1245. Verbalized understanding.

## 2014-08-15 ENCOUNTER — Encounter (HOSPITAL_COMMUNITY)
Admission: RE | Disposition: A | Discharge status: Home / Self Care | Payer: Self-pay | Source: Ambulatory Visit | Attending: Obstetrics & Gynecology

## 2014-08-15 ENCOUNTER — Inpatient Hospital Stay (HOSPITAL_COMMUNITY): Payer: 59 | Admitting: Anesthesiology

## 2014-08-15 ENCOUNTER — Encounter (HOSPITAL_COMMUNITY): Payer: Self-pay | Admitting: Anesthesiology

## 2014-08-15 ENCOUNTER — Inpatient Hospital Stay (HOSPITAL_COMMUNITY)
Admission: RE | Admit: 2014-08-15 | Discharge: 2014-08-19 | DRG: 749 | Disposition: A | Discharge status: Home / Self Care | Payer: 59 | Source: Ambulatory Visit | Attending: Obstetrics & Gynecology | Admitting: Obstetrics & Gynecology

## 2014-08-15 DIAGNOSIS — C569 Malignant neoplasm of unspecified ovary: Secondary | ICD-10-CM | POA: Diagnosis present

## 2014-08-15 DIAGNOSIS — E871 Hypo-osmolality and hyponatremia: Secondary | ICD-10-CM | POA: Diagnosis present

## 2014-08-15 DIAGNOSIS — K5900 Constipation, unspecified: Secondary | ICD-10-CM | POA: Diagnosis present

## 2014-08-15 DIAGNOSIS — R1900 Intra-abdominal and pelvic swelling, mass and lump, unspecified site: Secondary | ICD-10-CM | POA: Diagnosis present

## 2014-08-15 DIAGNOSIS — K219 Gastro-esophageal reflux disease without esophagitis: Secondary | ICD-10-CM

## 2014-08-15 DIAGNOSIS — R0602 Shortness of breath: Secondary | ICD-10-CM

## 2014-08-15 DIAGNOSIS — I10 Essential (primary) hypertension: Secondary | ICD-10-CM | POA: Diagnosis present

## 2014-08-15 DIAGNOSIS — I452 Bifascicular block: Secondary | ICD-10-CM | POA: Diagnosis present

## 2014-08-15 DIAGNOSIS — Z6824 Body mass index (BMI) 24.0-24.9, adult: Secondary | ICD-10-CM

## 2014-08-15 DIAGNOSIS — J90 Pleural effusion, not elsewhere classified: Secondary | ICD-10-CM | POA: Diagnosis present

## 2014-08-15 DIAGNOSIS — Z9221 Personal history of antineoplastic chemotherapy: Secondary | ICD-10-CM

## 2014-08-15 DIAGNOSIS — N179 Acute kidney failure, unspecified: Secondary | ICD-10-CM | POA: Diagnosis present

## 2014-08-15 DIAGNOSIS — I959 Hypotension, unspecified: Secondary | ICD-10-CM | POA: Diagnosis present

## 2014-08-15 DIAGNOSIS — J9811 Atelectasis: Secondary | ICD-10-CM | POA: Diagnosis not present

## 2014-08-15 DIAGNOSIS — Z853 Personal history of malignant neoplasm of breast: Secondary | ICD-10-CM | POA: Diagnosis not present

## 2014-08-15 DIAGNOSIS — I878 Other specified disorders of veins: Secondary | ICD-10-CM

## 2014-08-15 DIAGNOSIS — E43 Unspecified severe protein-calorie malnutrition: Secondary | ICD-10-CM | POA: Diagnosis present

## 2014-08-15 DIAGNOSIS — R180 Malignant ascites: Secondary | ICD-10-CM | POA: Diagnosis present

## 2014-08-15 DIAGNOSIS — R188 Other ascites: Secondary | ICD-10-CM

## 2014-08-15 HISTORY — PX: OMENTECTOMY: SHX5985

## 2014-08-15 HISTORY — PX: LAPAROTOMY: SHX154

## 2014-08-15 LAB — CBC
HEMATOCRIT: 35.4 % — AB (ref 36.0–46.0)
HEMOGLOBIN: 11.3 g/dL — AB (ref 12.0–15.0)
MCH: 27.9 pg (ref 26.0–34.0)
MCHC: 31.9 g/dL (ref 30.0–36.0)
MCV: 87.4 fL (ref 78.0–100.0)
Platelets: 385 10*3/uL (ref 150–400)
RBC: 4.05 MIL/uL (ref 3.87–5.11)
RDW: 14.1 % (ref 11.5–15.5)
WBC: 8.2 10*3/uL (ref 4.0–10.5)

## 2014-08-15 LAB — PREPARE RBC (CROSSMATCH)

## 2014-08-15 SURGERY — LAPAROTOMY, EXPLORATORY
Anesthesia: General

## 2014-08-15 MED ORDER — SODIUM CHLORIDE 0.9 % IJ SOLN
INTRAMUSCULAR | Status: AC
  Filled 2014-08-15: qty 20

## 2014-08-15 MED ORDER — BUPIVACAINE LIPOSOME 1.3 % IJ SUSP
INTRAMUSCULAR | Status: DC | PRN
  Administered 2014-08-15: 20 mL

## 2014-08-15 MED ORDER — KCL IN DEXTROSE-NACL 20-5-0.45 MEQ/L-%-% IV SOLN
INTRAVENOUS | Status: DC
  Administered 2014-08-15 – 2014-08-16 (×2): via INTRAVENOUS
  Filled 2014-08-15 (×5): qty 1000

## 2014-08-15 MED ORDER — CEFOXITIN SODIUM 2 G IV SOLR
2.0000 g | Freq: Once | INTRAVENOUS | Status: AC
  Administered 2014-08-15: 2 g via INTRAVENOUS

## 2014-08-15 MED ORDER — DEXAMETHASONE SODIUM PHOSPHATE 10 MG/ML IJ SOLN
INTRAMUSCULAR | Status: DC | PRN
  Administered 2014-08-15: 5 mg via INTRAVENOUS

## 2014-08-15 MED ORDER — ACETAMINOPHEN 500 MG PO TABS
1000.0000 mg | ORAL_TABLET | Freq: Four times a day (QID) | ORAL | Status: DC
  Administered 2014-08-15 – 2014-08-19 (×12): 1000 mg via ORAL
  Filled 2014-08-15 (×23): qty 2

## 2014-08-15 MED ORDER — KETOROLAC TROMETHAMINE 30 MG/ML IJ SOLN
30.0000 mg | Freq: Four times a day (QID) | INTRAMUSCULAR | Status: DC
Start: 2014-08-15 — End: 2014-08-16
  Administered 2014-08-15 – 2014-08-16 (×5): 30 mg via INTRAVENOUS
  Filled 2014-08-15 (×9): qty 1

## 2014-08-15 MED ORDER — ROCURONIUM BROMIDE 100 MG/10ML IV SOLN
INTRAVENOUS | Status: DC | PRN
  Administered 2014-08-15: 50 mg via INTRAVENOUS

## 2014-08-15 MED ORDER — OXYCODONE HCL 5 MG PO TABS: 5.0000 mg | ORAL_TABLET | Freq: Once | ORAL | Status: DC | PRN

## 2014-08-15 MED ORDER — GLYCOPYRROLATE 0.2 MG/ML IJ SOLN
INTRAMUSCULAR | Status: DC | PRN
  Administered 2014-08-15: .5 mg via INTRAVENOUS

## 2014-08-15 MED ORDER — HYDROMORPHONE HCL 1 MG/ML IJ SOLN: 0.5000 mg | INTRAMUSCULAR | Status: DC | PRN

## 2014-08-15 MED ORDER — 0.9 % SODIUM CHLORIDE (POUR BTL) OPTIME
TOPICAL | Status: DC | PRN
Start: 2014-08-15 — End: 2014-08-15
  Administered 2014-08-15: 2000 mL

## 2014-08-15 MED ORDER — BUPIVACAINE LIPOSOME 1.3 % IJ SUSP
20.0000 mL | Freq: Once | INTRAMUSCULAR | Status: DC
  Filled 2014-08-15: qty 20

## 2014-08-15 MED ORDER — MIDAZOLAM HCL 5 MG/5ML IJ SOLN
INTRAMUSCULAR | Status: DC | PRN
  Administered 2014-08-15 (×2): 1 mg via INTRAVENOUS

## 2014-08-15 MED ORDER — DEXTROSE 5 % IV SOLN: 1.0000 g | Freq: Once | INTRAVENOUS | Status: DC

## 2014-08-15 MED ORDER — HYDROMORPHONE HCL 1 MG/ML IJ SOLN
INTRAMUSCULAR | Status: AC
  Filled 2014-08-15: qty 1

## 2014-08-15 MED ORDER — OXYCODONE HCL 5 MG/5ML PO SOLN
5.0000 mg | Freq: Once | ORAL | Status: DC | PRN
  Filled 2014-08-15: qty 5

## 2014-08-15 MED ORDER — LIDOCAINE HCL (CARDIAC) 20 MG/ML IV SOLN
INTRAVENOUS | Status: DC | PRN
  Administered 2014-08-15: 50 mg via INTRAVENOUS

## 2014-08-15 MED ORDER — ALBUMIN HUMAN 5 % IV SOLN
12.5000 g | Freq: Three times a day (TID) | INTRAVENOUS | Status: AC
  Administered 2014-08-15 – 2014-08-16 (×3): 12.5 g via INTRAVENOUS
  Filled 2014-08-15 (×4): qty 250

## 2014-08-15 MED ORDER — ENOXAPARIN SODIUM 40 MG/0.4ML ~~LOC~~ SOLN
40.0000 mg | SUBCUTANEOUS | Status: DC
  Administered 2014-08-16 – 2014-08-19 (×3): 40 mg via SUBCUTANEOUS
  Filled 2014-08-15 (×4): qty 0.4

## 2014-08-15 MED ORDER — ENALAPRIL MALEATE 10 MG PO TABS
10.0000 mg | ORAL_TABLET | Freq: Every morning | ORAL | Status: DC
  Filled 2014-08-15: qty 1

## 2014-08-15 MED ORDER — ONDANSETRON HCL 4 MG/2ML IJ SOLN: 4.0000 mg | Freq: Four times a day (QID) | INTRAMUSCULAR | Status: DC | PRN

## 2014-08-15 MED ORDER — LEVOTHYROXINE SODIUM 150 MCG PO TABS
150.0000 ug | ORAL_TABLET | Freq: Every day | ORAL | Status: DC
  Administered 2014-08-16 – 2014-08-19 (×4): 150 ug via ORAL
  Filled 2014-08-15 (×6): qty 1

## 2014-08-15 MED ORDER — ACETAMINOPHEN 160 MG/5ML PO SOLN
325.0000 mg | ORAL | Status: DC | PRN
Start: 2014-08-15 — End: 2014-08-15
  Filled 2014-08-15: qty 20.3

## 2014-08-15 MED ORDER — ENSURE COMPLETE PO LIQD
237.0000 mL | Freq: Two times a day (BID) | ORAL | Status: DC
  Administered 2014-08-16 – 2014-08-18 (×3): 237 mL via ORAL

## 2014-08-15 MED ORDER — ONDANSETRON HCL 4 MG/2ML IJ SOLN
INTRAMUSCULAR | Status: DC | PRN
  Administered 2014-08-15: 4 mg via INTRAVENOUS

## 2014-08-15 MED ORDER — SODIUM CHLORIDE 0.9 % IV SOLN: Freq: Once | INTRAVENOUS | Status: DC

## 2014-08-15 MED ORDER — CEFOXITIN SODIUM 2 G IV SOLR: 2.0000 g | Freq: Once | INTRAVENOUS | Status: DC

## 2014-08-15 MED ORDER — DEXTROSE 5 % IV SOLN
INTRAVENOUS | Status: AC
  Filled 2014-08-15: qty 2

## 2014-08-15 MED ORDER — KETOROLAC TROMETHAMINE 30 MG/ML IJ SOLN
30.0000 mg | Freq: Four times a day (QID) | INTRAMUSCULAR | Status: DC
  Filled 2014-08-15 (×4): qty 1

## 2014-08-15 MED ORDER — SUFENTANIL CITRATE 50 MCG/ML IV SOLN
INTRAVENOUS | Status: DC | PRN
  Administered 2014-08-15 (×2): 5 ug via INTRAVENOUS
  Administered 2014-08-15 (×2): 2.5 ug via INTRAVENOUS

## 2014-08-15 MED ORDER — ONDANSETRON HCL 4 MG PO TABS
4.0000 mg | ORAL_TABLET | Freq: Four times a day (QID) | ORAL | Status: DC | PRN
  Administered 2014-08-16: 4 mg via ORAL
  Filled 2014-08-15: qty 1

## 2014-08-15 MED ORDER — HYDROMORPHONE HCL 1 MG/ML IJ SOLN
0.2500 mg | INTRAMUSCULAR | Status: DC | PRN
  Administered 2014-08-15 (×2): 0.5 mg via INTRAVENOUS
  Administered 2014-08-15 (×2): 0.25 mg via INTRAVENOUS

## 2014-08-15 MED ORDER — ENOXAPARIN SODIUM 40 MG/0.4ML ~~LOC~~ SOLN
40.0000 mg | SUBCUTANEOUS | Status: AC
  Administered 2014-08-15: 40 mg via SUBCUTANEOUS
  Filled 2014-08-15: qty 0.4

## 2014-08-15 MED ORDER — LACTATED RINGERS IV SOLN
INTRAVENOUS | Status: DC | PRN
  Administered 2014-08-15 (×3): via INTRAVENOUS

## 2014-08-15 MED ORDER — ACETAMINOPHEN 325 MG PO TABS
325.0000 mg | ORAL_TABLET | ORAL | Status: DC | PRN
Start: 2014-08-15 — End: 2014-08-15

## 2014-08-15 MED ORDER — SENNOSIDES-DOCUSATE SODIUM 8.6-50 MG PO TABS
2.0000 | ORAL_TABLET | Freq: Every day | ORAL | Status: DC
  Administered 2014-08-15 – 2014-08-16 (×2): 2 via ORAL
  Filled 2014-08-15 (×6): qty 2

## 2014-08-15 MED ORDER — PROPOFOL 10 MG/ML IV BOLUS
INTRAVENOUS | Status: DC | PRN
  Administered 2014-08-15: 30 mg via INTRAVENOUS
  Administered 2014-08-15: 120 mg via INTRAVENOUS
  Administered 2014-08-15: 20 mg via INTRAVENOUS

## 2014-08-15 MED ORDER — TRAMADOL HCL 50 MG PO TABS
100.0000 mg | ORAL_TABLET | Freq: Four times a day (QID) | ORAL | Status: DC
  Administered 2014-08-15 – 2014-08-19 (×14): 100 mg via ORAL
  Filled 2014-08-15 (×14): qty 2

## 2014-08-15 MED ORDER — NEOSTIGMINE METHYLSULFATE 10 MG/10ML IV SOLN
INTRAVENOUS | Status: DC | PRN
  Administered 2014-08-15: 3.5 mg via INTRAVENOUS

## 2014-08-15 MED ORDER — PHENYLEPHRINE HCL 10 MG/ML IJ SOLN
INTRAMUSCULAR | Status: DC | PRN
  Administered 2014-08-15 (×2): 40 ug via INTRAVENOUS
  Administered 2014-08-15: 80 ug via INTRAVENOUS
  Administered 2014-08-15 (×2): 40 ug via INTRAVENOUS

## 2014-08-15 SURGICAL SUPPLY — 46 items
ATTRACTOMAT 16X20 MAGNETIC DRP (DRAPES) IMPLANT
BLADE EXTENDED COATED 6.5IN (ELECTRODE) ×3 IMPLANT
CHLORAPREP W/TINT 26ML (MISCELLANEOUS) ×3 IMPLANT
CLIP TI MEDIUM LARGE 6 (CLIP) ×6 IMPLANT
CONT SPEC 4OZ CLIKSEAL STRL BL (MISCELLANEOUS) ×3 IMPLANT
COVER SURGICAL LIGHT HANDLE (MISCELLANEOUS) ×3 IMPLANT
DRAPE INCISE IOBAN 66X45 STRL (DRAPES) ×3 IMPLANT
DRAPE UTILITY 15X26 (DRAPE) ×3 IMPLANT
DRAPE WARM FLUID 44X44 (DRAPE) ×3 IMPLANT
DRESSING TELFA ISLAND 4X8 (GAUZE/BANDAGES/DRESSINGS) ×3 IMPLANT
ELECT LIGASURE SHORT 9 REUSE (ELECTRODE) IMPLANT
ELECT REM PT RETURN 9FT ADLT (ELECTROSURGICAL) ×3
ELECTRODE REM PT RTRN 9FT ADLT (ELECTROSURGICAL) ×2 IMPLANT
FLOSEAL 10ML (HEMOSTASIS) ×3 IMPLANT
GAUZE SPONGE 4X4 12PLY STRL (GAUZE/BANDAGES/DRESSINGS) ×3 IMPLANT
GAUZE SPONGE 4X4 16PLY XRAY LF (GAUZE/BANDAGES/DRESSINGS) IMPLANT
GLOVE BIO SURGEON STRL SZ 6 (GLOVE) ×6 IMPLANT
GLOVE BIO SURGEON STRL SZ 6.5 (GLOVE) ×6 IMPLANT
GLOVE BIOGEL M STRL SZ7.5 (GLOVE) ×6 IMPLANT
GOWN STRL REUS W/ TWL LRG LVL3 (GOWN DISPOSABLE) ×4 IMPLANT
GOWN STRL REUS W/TWL LRG LVL3 (GOWN DISPOSABLE) ×5 IMPLANT
GOWN STRL REUS W/TWL XL LVL3 (GOWN DISPOSABLE) ×3 IMPLANT
KIT BASIN OR (CUSTOM PROCEDURE TRAY) ×3 IMPLANT
LIQUID BAND (GAUZE/BANDAGES/DRESSINGS) IMPLANT
LOOP VESSEL MAXI BLUE (MISCELLANEOUS) IMPLANT
NEEDLE HYPO 22GX1.5 SAFETY (NEEDLE) ×6 IMPLANT
NS IRRIG 1000ML POUR BTL (IV SOLUTION) IMPLANT
PACK GENERAL/GYN (CUSTOM PROCEDURE TRAY) ×3 IMPLANT
SHEET LAVH (DRAPES) ×3 IMPLANT
SPONGE LAP 18X18 X RAY DECT (DISPOSABLE) IMPLANT
STAPLER VISISTAT 35W (STAPLE) ×3 IMPLANT
SUT MNCRL AB 4-0 PS2 18 (SUTURE) IMPLANT
SUT PDS AB 1 TP1 96 (SUTURE) ×6 IMPLANT
SUT VIC AB 0 CT1 36 (SUTURE) ×9 IMPLANT
SUT VIC AB 2-0 CT1 36 (SUTURE) ×9 IMPLANT
SUT VIC AB 2-0 CT2 27 (SUTURE) ×12 IMPLANT
SUT VIC AB 2-0 SH 27 (SUTURE)
SUT VIC AB 2-0 SH 27X BRD (SUTURE) IMPLANT
SUT VIC AB 3-0 CTX 36 (SUTURE) IMPLANT
SUT VICRYL 2 0 18  UND BR (SUTURE) ×1
SUT VICRYL 2 0 18 UND BR (SUTURE) ×2 IMPLANT
SYR 20CC LL (SYRINGE) ×6 IMPLANT
TOWEL OR 17X26 10 PK STRL BLUE (TOWEL DISPOSABLE) ×6 IMPLANT
TOWEL OR NON WOVEN STRL DISP B (DISPOSABLE) ×3 IMPLANT
TRAY FOLEY CATH 14FRSI W/METER (CATHETERS) ×3 IMPLANT
WATER STERILE IRR 1500ML POUR (IV SOLUTION) IMPLANT

## 2014-08-15 NOTE — Transfer of Care (Signed)
Immediate Anesthesia Transfer of Care Note  Patient: Kathleen Bond  Procedure(s) Performed: Procedure(s): EXPLORATORY LAPAROTOMY (N/A) biopsy of omentum (N/A)  Patient Location: PACU  Anesthesia Type:General  Level of Consciousness: awake, alert  and oriented  Airway & Oxygen Therapy: Patient Spontanous Breathing and Patient connected to face mask oxygen  Post-op Assessment: Report given to PACU RN and Post -op Vital signs reviewed and stable  Post vital signs: Reviewed and stable  Complications: No apparent anesthesia complications

## 2014-08-15 NOTE — Anesthesia Postprocedure Evaluation (Signed)
  Anesthesia Post-op Note  Patient: Kathleen Bond  Procedure(s) Performed: Procedure(s): EXPLORATORY LAPAROTOMY (N/A) biopsy of omentum (N/A)  Patient Location: PACU  Anesthesia Type:General  Level of Consciousness: awake  Airway and Oxygen Therapy: Patient Spontanous Breathing and Patient connected to nasal cannula oxygen  Post-op Pain: mild  Post-op Assessment: Post-op Vital signs reviewed, Patient's Cardiovascular Status Stable, Respiratory Function Stable, Patent Airway and No signs of Nausea or vomiting  Post-op Vital Signs: Reviewed and stable  Last Vitals:  Filed Vitals:   08/15/14 1935  BP: 93/62  Pulse: 99  Temp: 36.4 C  Resp: 16    Complications: No apparent anesthesia complications

## 2014-08-15 NOTE — H&P (View-Only) (Signed)
Consult Note: Gyn-Onc  Consult was requested by Dr. Eula Fried for the evaluation of Kathleen Bond 64 y.o. female with ascites, pelvic mass and signs concerning for stage IV ovarian cancer.  CC:  Chief Complaint  Patient presents with  . pelvic mass    Assessment/Plan:  Kathleen Bond  is a 64 y.o.  year old with apparent advanced stage (IV) ovarian cancer. Her imaging is consistent with a stage IIIC or 4 disease with omental caking, large pelvic mass, para-aortic lymphadenopathy, pleural effusions and ascites, and a right 3 mm subpleural nodule. She is otherwise a very healthy woman with good nutritional status and excellent performance status. Of note she lives with her 75 year old blind mother. However she reports good social support.  I had a long discussion with [name] regarding the nature of ovarian cancer, and recommended therapies. I discussed the importance of a combination therapy including both chemotherapy and surgery. Given the distribution of disease on imaging, and her excellent performance status, I believe she is a good candidate for upfront surgical debulking with the goal being to resect to microscopic disease or optimal disease (less than 1 cm). Postoperatively, if pathology confirms epithelial ovarian cancer, she would require adjuvant chemotherapy in the form of either IV or IP administration. She expressed understanding of this therapeutic course.  I discussed the anticipated surgery. I'm recommending an exploratory laparotomy, TAH, BSO, omentectomy, lymphadenectomy and tumor cytoreductive surgery. Based on the CT images and my physical examination I have a high degree of concern regarding rectal involvement. I discussed with Cozette the possibility of a low anterior resection of the sigmoid colon and rectum. I discussed that in most cases we are able to achieve a primary reanastomosis without formation of a GI stoma. However I discussed the possibility of seroma formation for  protection of an anastomosis if surgically it is the most prudent course. I discussed operative risks including  bleeding, infection, damage to internal organs (such as bladder,ureters, bowels), blood clot, reoperation and rehospitalization. If bowel surgery is necessary these risks are increased. I discussed that preoperative optimization with protein and high calorie supplemental drinks has been demonstrated to show a reduction in perioperative complications and however recommended that she take these in the preoperative period. Her albumin is within normal range at 3.6 as drawn on 07/21/2014.  I have ordered a paracentesis to relieve her symptoms of abdominal distention prior to surgery. We will send this fluid for cytology.  Given her personal history of breast cancer and now a likely GYN malignancy she is at high risk for carrying a deleterious mutation on BRCA and I am recommending that she undergo genetic screening for this. This can be delayed until the postoperative period during adjuvant therapy.  HPI: Kathleen Bond is a very pleasant 64 year old gravida 2 para 2 who is seen in consultation at the request of Dr.Qureshi for apparent advanced stage ovarian cancer. The patient was seen in the emergency room at [facility] on 07/21/2014 for symptoms of abdominal bloating, early satiety, pelvic pressure. She also has intermittent constipation and narrowing of stool caliber. She's noticed the symptoms becoming progressively worse since Thanksgiving 2015. A CT scan of the abdomen and pelvis was ordered at [facility] at this ER visit and demonstrated large volume ascites, small bilateral pleural effusions, a 3 mm subpleural nodule at the right lung base. Additionally there was a large omental cake, and retroperitoneal adenopathy in the para-aortic region with larger nodes on the left than the  right and the largest measuring 11 x 14 mm. There was a an elongated soft tissue mass  extending from the small bowel mesentery to the left upper quadrant. Additionally in the pelvis a large multicystic central pelvic mass abutting the sigmoid colon was seen. These findings were most consistent with advanced ovarian cancer.   Interval History: She denies rectal bleeding however does report intermittent constipation. She's been attempting to optimize her nutrition since receiving her diagnosis.  Current Meds:  Outpatient Encounter Prescriptions as of 07/25/2014  Medication Sig  . Calcium Carbonate-Vitamin D (CALCIUM + D PO) Take by mouth daily.  . furosemide (LASIX) 20 MG tablet Take 5 mg by mouth daily.  Marland Kitchen levothyroxine (SYNTHROID, LEVOTHROID) 125 MCG tablet Take 150 mcg by mouth daily.     Allergy: No Known Allergies  Social Hx:   History   Social History  . Marital Status: Divorced    Spouse Name: N/A    Number of Children: N/A  . Years of Education: N/A   Occupational History  . Not on file.   Social History Main Topics  . Smoking status: Never Smoker   . Smokeless tobacco: Never Used  . Alcohol Use: 4.2 oz/week    7 Cans of beer per week  . Drug Use: No  . Sexual Activity: Not on file   Other Topics Concern  . Not on file   Social History Narrative    Past Surgical Hx:  Past Surgical History  Procedure Laterality Date  . Left breast lumpectomy  2004  . Parathyroidectomy  2005  . Tubal ligation Bilateral     Past Medical Hx:  Past Medical History  Diagnosis Date  . Arthritis   . Hypertension   . Breast cancer 2004    left breast lumpectomy  . Hyperthyroidism     Past Gynecological History:  G2P2, SVD  No LMP recorded. Patient is postmenopausal.  Family Hx:  Family History  Problem Relation Age of Onset  . Colon cancer Neg Hx   . Stomach cancer Neg Hx     Review of Systems:  Constitutional  Feels well,    ENT Normal appearing ears and nares bilaterally Skin/Breast  No rash, sores, jaundice, itching, dryness Cardiovascular  No  chest pain, shortness of breath, or edema  Pulmonary  No cough or wheeze.  Gastro Intestinal  No nausea, vomitting, or diarrhoea. No bright red blood per rectum, no abdominal pain. +change in bowel movement. +constipation.  + early satiety. Genito Urinary  No frequency, urgency, dysuria, see HPI Musculo Skeletal  No myalgia, arthralgia, joint swelling or pain  Neurologic  No weakness, numbness, change in gait,  Psychology  No depression, anxiety, insomnia.   Vitals:  Blood pressure 148/91, pulse 100, temperature 98.1 F (36.7 C), temperature source Oral, resp. rate 18, height _0  (1.575 m), weight 129 lb 3.2 oz (58.605 kg).  Physical Exam: WD in NAD Neck  Supple NROM, without any enlargements.  Lymph Node Survey No cervical supraclavicular or inguinal adenopathy Cardiovascular  Pulse normal rate, regularity and rhythm. S1 and S2 normal.  Lungs  Clear to auscultation bilateraly, without wheezes/crackles/rhonchi. Decreased breath sounds bibasally consistent with pleural effusions. Skin  No rash/lesions/breakdown  Psychiatry  Alert and oriented to person, place, and time  Abdomen  Normoactive bowel sounds, abdomen soft, distended with fluid wave, non-tender and nonobese without evidence of hernia.  Back No CVA tenderness Genito Urinary  Vulva/vagina: Normal external female genitalia.  No lesions. No discharge or bleeding.  Bladder/urethra:  No lesions or masses, well supported bladder  Vagina: normal and atrophic in appearance.  Cervix: Normal appearing, no lesions.  Uterus: Small, mobile, cul de sac nodularity and fluid appreciated.  Adnexa: bilateral nodular masses. Rectal  Good tone, + cul de sac nodularity and close approximation between tumor and anterior rectal wall.  Extremities  No bilateral cyanosis, clubbing or edema.   [name], MD   07/25/2014, 2:55 PM

## 2014-08-15 NOTE — Interval H&P Note (Signed)
History and Physical Interval Note:  08/15/2014 1:08 PM  Kathleen Bond  has presented today for surgery, with the diagnosis of OVARIAN CANCER  The various methods of treatment have been discussed with the patient and family. After consideration of risks, benefits and other options for treatment, the patient has consented to  Procedure(s): EXPLORATORY LAPAROTOMY (N/A) TOTAL HYSTERECTOMY ABDOMINAL (N/A) BILATERAL SALPINGO OOPHORECTOMY (Bilateral) OMENTECTOMY (N/A) DEBULKING WITH BOWEL RESECTION (N/A) as a surgical intervention .  The patient's history has been reviewed, patient examined, no change in status, stable for surgery.  I have reviewed the patient's chart and labs.  Questions were answered to the patient's satisfaction.     [name]

## 2014-08-15 NOTE — Op Note (Signed)
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   Anesthesia: General    Estimated blood loss: 50 ml Ascites: 4000cc  IVF: 2000 ml (crystalloid)  Urine output:  15 ml   Complications: None   Pathology: omental biopsy  Operative findings: 4 L ascites (amber colored). Entire peritoneal surface replaced by tumor plaque. Small intestine coated with tumor and mesentery tethered with tumor nodules. Ileum involving/adherent to pelvic mass. Dense plaque of tumor on the right diaphragm. 10cm omental cake in left upper quadrant.  Surgery aborted after biopsy of omentum.  Procedure: The patient was identified in the preoperative holding area. Informed consent was signed on the chart. Patient was seen history was reviewed and exam was performed.   The patient was then taken to the operating room and placed in the supine position with SCD hose on. General anesthesia was then induced without difficulty. She was then placed in the dorsolithotomy position. The abdomen was prepped with chlor prep sponges per protocol. Perineum was prepped with Betadine. The vagina was prepped with Betadine a Foley catheter was inserted into the bladder under sterile conditions.  The patient was then draped after the prep was dried. Timeout was performed the patient, procedure, antibiotic, allergy, and length of procedure. A right paramedian incision was and carried down to the underlying fascia using Bovie cautery. The fascia was scored in the fascial incision was extended superiorly and inferiorly using Bovie cautery. The  rectus bellies were dissected off the overlying fascia. The peritoneum was tented and entered. The peritoneal incision was extended superiorly and inferiorly with visualization of the underlying peritoneal cavity. The previously stated findings were noted. The omentum was grasped with an allis clamp and a 2cm fragment of biopsy was removed. It was made hemostatic with the electrosurgical device and floseal and a piece of gelfoam was placed overlying the biopsy site. It was noted to be hemostatic.  The fascia was closed using running mass closure of #1 PDS. The subcutaneous tissues were irrigated and made hemostatic. 20 mL of Exparel within 20 mL of normal saline was injected for postoperative pain control. The skin was closed using staples.  All instrument, suture, laparotomy, Ray-Tec, and needle counts were correct x2. The patient tolerated the procedure well and was taken recovery room in stable condition. This is Everitt Amber dictating an operative note on Kathleen Bond.

## 2014-08-15 NOTE — Anesthesia Preprocedure Evaluation (Signed)
Anesthesia Evaluation  Patient identified by MRN, date of birth, ID band Patient awake    Reviewed: Allergy & Precautions, NPO status , Patient's Chart, lab work & pertinent test results  History of Anesthesia Complications Negative for: history of anesthetic complications  Airway Mallampati: II  TM Distance: >3 FB Neck ROM: Full    Dental  (+) Teeth Intact   Pulmonary neg pulmonary ROS,  breath sounds clear to auscultation        Cardiovascular hypertension, Pt. on medications - angina- Past MI and - CHF Rhythm:Regular     Neuro/Psych negative neurological ROS  negative psych ROS   GI/Hepatic negative GI ROS, Neg liver ROS,   Endo/Other  Hypothyroidism   Renal/GU negative Renal ROS     Musculoskeletal   Abdominal   Peds  Hematology negative hematology ROS (+)   Anesthesia Other Findings   Reproductive/Obstetrics                             Anesthesia Physical Anesthesia Plan  ASA: III  Anesthesia Plan: General   Post-op Pain Management:    Induction: Intravenous  Airway Management Planned: Oral ETT  Additional Equipment: None  Intra-op Plan:   Post-operative Plan: Extubation in OR  Informed Consent: I have reviewed the patients History and Physical, chart, labs and discussed the procedure including the risks, benefits and alternatives for the proposed anesthesia with the patient or authorized representative who has indicated his/her understanding and acceptance.   Dental advisory given  Plan Discussed with: CRNA and Surgeon  Anesthesia Plan Comments:         Anesthesia Quick Evaluation

## 2014-08-16 ENCOUNTER — Telehealth: Payer: Self-pay | Admitting: Oncology

## 2014-08-16 ENCOUNTER — Other Ambulatory Visit: Payer: Self-pay | Admitting: Oncology

## 2014-08-16 ENCOUNTER — Telehealth: Payer: Self-pay | Admitting: *Deleted

## 2014-08-16 ENCOUNTER — Telehealth: Payer: Self-pay

## 2014-08-16 ENCOUNTER — Encounter (HOSPITAL_COMMUNITY): Payer: Self-pay | Admitting: Gynecologic Oncology

## 2014-08-16 ENCOUNTER — Encounter: Payer: Self-pay | Admitting: Oncology

## 2014-08-16 DIAGNOSIS — R634 Abnormal weight loss: Secondary | ICD-10-CM

## 2014-08-16 DIAGNOSIS — C569 Malignant neoplasm of unspecified ovary: Secondary | ICD-10-CM

## 2014-08-16 DIAGNOSIS — E43 Unspecified severe protein-calorie malnutrition: Secondary | ICD-10-CM | POA: Insufficient documentation

## 2014-08-16 DIAGNOSIS — Z853 Personal history of malignant neoplasm of breast: Secondary | ICD-10-CM

## 2014-08-16 DIAGNOSIS — K5909 Other constipation: Secondary | ICD-10-CM

## 2014-08-16 DIAGNOSIS — E079 Disorder of thyroid, unspecified: Secondary | ICD-10-CM

## 2014-08-16 LAB — BASIC METABOLIC PANEL
ANION GAP: 6 (ref 5–15)
Anion gap: 6 (ref 5–15)
BUN: 17 mg/dL (ref 6–23)
BUN: 16 mg/dL (ref 6–23)
CALCIUM: 8 mg/dL — AB (ref 8.4–10.5)
CHLORIDE: 102 mmol/L (ref 96–112)
CO2: 26 mmol/L (ref 19–32)
CO2: 24 mmol/L (ref 19–32)
CREATININE: 0.79 mg/dL (ref 0.50–1.10)
Calcium: 7.7 mg/dL — ABNORMAL LOW (ref 8.4–10.5)
Chloride: 104 mmol/L (ref 96–112)
Creatinine, Ser: 0.75 mg/dL (ref 0.50–1.10)
GFR calc Af Amer: >90 mL/min (ref >90)
GFR calc Af Amer: >90 mL/min (ref >90)
GFR calc non Af Amer: 88 mL/min — ABNORMAL LOW (ref >90)
GFR calc non Af Amer: 90 mL/min — ABNORMAL LOW (ref >90)
Glucose, Bld: 138 mg/dL — ABNORMAL HIGH (ref 70–99)
Glucose, Bld: 116 mg/dL — ABNORMAL HIGH (ref 70–99)
POTASSIUM: 4.4 mmol/L (ref 3.5–5.1)
Potassium: 3.6 mmol/L (ref 3.5–5.1)
Sodium: 134 mmol/L — ABNORMAL LOW (ref 135–145)
Sodium: 134 mmol/L — ABNORMAL LOW (ref 135–145)

## 2014-08-16 LAB — URINALYSIS, ROUTINE W REFLEX MICROSCOPIC
BILIRUBIN URINE: NEGATIVE
Glucose, UA: NEGATIVE mg/dL
Hgb urine dipstick: NEGATIVE
KETONES UR: NEGATIVE mg/dL
NITRITE: NEGATIVE
PH: 6 (ref 5.0–8.0)
PROTEIN: NEGATIVE mg/dL
Specific Gravity, Urine: 1.036 — ABNORMAL HIGH (ref 1.005–1.030)
Urobilinogen, UA: 0.2 mg/dL (ref 0.0–1.0)

## 2014-08-16 LAB — URINE MICROSCOPIC-ADD ON

## 2014-08-16 LAB — CBC
HCT: 31.8 % — ABNORMAL LOW (ref 36.0–46.0)
HEMOGLOBIN: 10.2 g/dL — AB (ref 12.0–15.0)
MCH: 27.6 pg (ref 26.0–34.0)
MCHC: 32.1 g/dL (ref 30.0–36.0)
MCV: 86.2 fL (ref 78.0–100.0)
Platelets: 353 10*3/uL (ref 150–400)
RBC: 3.69 MIL/uL — ABNORMAL LOW (ref 3.87–5.11)
RDW: 13.9 % (ref 11.5–15.5)
WBC: 6.8 10*3/uL (ref 4.0–10.5)

## 2014-08-16 MED ORDER — ENOXAPARIN (LOVENOX) PATIENT EDUCATION KIT
PACK | Freq: Once | Status: AC
  Administered 2014-08-16: 14:00:00
  Filled 2014-08-16: qty 1

## 2014-08-16 MED ORDER — DEXTROSE-NACL 5-0.45 % IV SOLN
INTRAVENOUS | Status: DC
  Administered 2014-08-16 – 2014-08-17 (×2): via INTRAVENOUS

## 2014-08-16 MED ORDER — DEXAMETHASONE 6 MG PO TABS
20.0000 mg | ORAL_TABLET | ORAL | Status: DC
  Filled 2014-08-16 (×2): qty 1

## 2014-08-16 MED ORDER — DOCUSATE SODIUM 100 MG PO CAPS
100.0000 mg | ORAL_CAPSULE | Freq: Two times a day (BID) | ORAL | Status: DC
  Administered 2014-08-16 – 2014-08-19 (×4): 100 mg via ORAL
  Filled 2014-08-16 (×9): qty 1

## 2014-08-16 MED ORDER — PANTOPRAZOLE SODIUM 40 MG IV SOLR
40.0000 mg | Freq: Every day | INTRAVENOUS | Status: DC
  Administered 2014-08-16 – 2014-08-18 (×3): 40 mg via INTRAVENOUS
  Filled 2014-08-16 (×4): qty 40

## 2014-08-16 MED ORDER — DEXTROSE-NACL 5-0.45 % IV SOLN
INTRAVENOUS | Status: AC
  Administered 2014-08-16: 10:00:00 via INTRAVENOUS

## 2014-08-16 MED ORDER — DEXTROSE-NACL 5-0.45 % IV SOLN: INTRAVENOUS | Status: DC

## 2014-08-16 MED ORDER — SODIUM CHLORIDE 0.9 % IV BOLUS (SEPSIS)
500.0000 mL | Freq: Once | INTRAVENOUS | Status: AC
  Administered 2014-08-16: 500 mL via INTRAVENOUS

## 2014-08-16 MED ORDER — ENSURE COMPLETE PO LIQD: 237.0000 mL | Freq: Two times a day (BID) | ORAL | Status: DC

## 2014-08-16 MED ORDER — ONDANSETRON 8 MG/NS 50 ML IVPB
8.0000 mg | Freq: Three times a day (TID) | INTRAVENOUS | Status: DC | PRN
  Filled 2014-08-16: qty 8

## 2014-08-16 NOTE — Consult Note (Signed)
MEDICAL ONCOLOGY 08-16-2014  Reason for Consult: urgent initiation of chemotherapy as initial intervention for advanced high grade serous gyn carcinoma  Referring Physician: Everitt Amber  Kathleen Bond is an 64 y.o. female seen in consultation, together with sister and son, while hospitalized by gyn oncology at [facility], for consideration of urgent initiation of chemotherapy for advanced high grade serous gyn carcinoma which was not appropriate for debulking at exploratory laparotomy with omental biopsy done 08-15-14.   History is from EMR, patient and sister now. I have also requested records from [facility] in [place], where she was treated with chemotherapy + herceptin for stage 2 left breast cancer by Dr Marcene Duos ~ 2004. She had left lumpectomy and 9 left axillary nodes removed, was not on oral hormonal blocker, did have radiation also in [place], had PAC for chemo that was removed subsequently. She does not believe that she had adriamycin, recalls that she tolerated the chemotherapy well, but does not remember which agents other than herceptin were used.   Last mammograms were in [place] August 2015, reportedly not remarkable. She is no longer followed at [facility] in [place]. She does not know that she had genetics testing.  HPI: Patient became symptomatic with abdominal swelling and constipation in late Nov 2015, seen and treated initially at [facility] ED and by PCP Dr Cleda Mccreedy for what was thought to be ulcer. Symptoms continued, with early satiety causing poor po intake; she was seen again at [facility] ED in early Jan 2016, with CT reportedly consistent with stage III or IV gyn cancer, including omental caking, large pelvic mass, para-aortic adenopathy, ascites, pleural effusions and right subpleural nodule. She was seen in consultation by Dr Denman George on 07-25-14, with concern from her exam and scan for rectal involvement. 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 She had paracentesis of 3.4 liters ascites on 07-27-2014, with cytology ([id number]) showing serous carcinoma consistent with gyn primary. She had right thoracentesis for 350 cc on 08-11-14, this requested by anesthesia prior to surgery and apparently not sent for cytology. Patient was more comfortable after paracentesis, but could not tell any difference in breathing after thoracentesis (denies SOB prior to that procedure). CA 125 on 08-02-14 was 1162. She was taken to exploratory laparotomy by Dr Denman George on 08-15-14, with 4 liters of ascites removed and biopsy of omentum obtained, with additional debulking not attempted due to surgical findings of entire peritoneal surface replaced by tumor plaque, small intestine coated with tumor and mesentery tethered with tumor nodules, ileum adherent to pelvic mass, dense plaque of tumor on right diaphragm and 10 cm omental cake in LUQ. She has had no complications from the surgery, pain controlled with dilaudid/ tramadol, tolerating clear liquids.    ROS: usual weight prior to this illness ~ 139 - 140 lbs. Weight loss at least 11 lbs in past 3-4 months, tho difficult to know exactly with ascites. No nausea or vomiting. New heartburn. New constipation, last bowel movements just prior to surgery after magnesium citrate. No bleeding. No pain. No cough or wheeze. No LE swelling. No bleeding. Bladder ok. No cardiac symptoms. No noted changes in breasts. Venous access has been easily accomplished in RUE. No peripheral neuropathy  Allergies: No Known Allergies Past Medical History  Diagnosis Date  . Hypertension   . Breast cancer 2004    left breast lumpectomy  . Hypothyroidism   Reportedly stage 2 left breast  cancer 2004, treated with lumpectomy and 9 node axillary evaluation, IV chemotherapy with herceptin (no adriamycin) by Dr Marcene Duos, no hormonal blockers, local radiation. Chemo and radiation were done in  [place].  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 Left breast lumpectomy  2004  . Parathyroidectomy  2005  . Tubal ligation Bilateral   . Laparotomy N/A 08/15/2014    Procedure: EXPLORATORY LAPAROTOMY;  Surgeon: Everitt Amber, MD;  Location: [facility];  Service: Gynecology;  Laterality: N/A;  . Omentectomy N/A 08/15/2014    Procedure: biopsy of omentum;  Surgeon: Everitt Amber, MD;  Location: [facility];  Service: Gynecology;  Laterality: N/A;    Family History  Problem Relation Age of Onset  . Colon cancer Neg Hx   . Stomach cancer Neg Hx   . Hypertension Mother   . Asthma Father   Father had "brain cancer", elderly Brother had bladder cancer ~ age 89, smoker No breast or gyn cancer 4 siblings 1 child died in MVA, 1 son age 76   Social History:  reports that she has never smoked. She has never used smokeless tobacco. She reports that she drinks alcohol. She reports that she does not use illicit drugs. Occasional wine. Originally from [place], lives near [place]. 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 Lives with son and 84 y o mother; family and caregiver help with mother, who has dementia.   Medications: reviewed as listed in EMR. Have added IV protonix, prn IV zofran and premedication decadron 20 mg 12 hrs and 6 hrs prior to chemo  Blood pressure 86/56, pulse 74, temperature 98.7 F (37.1 C), temperature source Oral, resp. rate 14, height 5' 2"  (1.575 m), weight 121 lb 11.1 oz (55.2 kg), SpO2 97 %. Physical Exam Pleasant lady looks stated age, comfortable in bed on RA, able to sit up easily from supine position with no assistance. Normal hair pattern. PERRL, not icteric. Neck supple without JVD or obvious thyroid mass. Lungs with decreased BS just in bases posteriorly, otherwise clear to auscultation. Heart RRR  no gallop. Breast exam not done with family present. No cervical or supraclavicular adenopathy. Peripheral IV RUE site ok. Abdomen seems moderately distended, not tight, not tender, cannnot appreciate HSM or mass. Ventral midline surgical incision with staples, no erythema and no drainage. Foley in, clear urine. LE no edema, cords, tenderness, with PAS on. Feet warm . Neuro: speech fluent and appropriate, fully oriented and appropriate. CN, motor, cerebellar, sensory nonfocal on exam just in bed. PSYCH appropriate mood and affect.    Results for orders placed or performed during the hospital encounter of 08/15/14 (from the past 48 hour(s))  Type and screen     Status: None (Preliminary result)   Collection Time: 08/15/14  2:55 PM  Result Value Ref Range   ABO/RH(D) B POS    Antibody Screen NEG    Sample Expiration 08/18/2014    Unit Number [id number]    Blood Component Type RED CELLS,LR    Unit division 00    Status of Unit ALLOCATED    Transfusion Status OK TO TRANSFUSE    Crossmatch Result Compatible    Unit Number [id number]    Blood Component Type RED CELLS,LR    Unit division 00    Status of Unit ALLOCATED    Transfusion Status OK TO TRANSFUSE  Crossmatch Result Compatible   Prepare RBC     Status: None   Collection Time: 08/15/14  3:00 PM  Result Value Ref Range   Order Confirmation ORDER PROCESSED BY BLOOD BANK   CBC     Status: Abnormal   Collection Time: 08/15/14  6:25 PM  Result Value Ref Range   WBC 8.2 4.0 - 10.5 K/uL   RBC 4.05 3.87 - 5.11 MIL/uL   Hemoglobin 11.3 (L) 12.0 - 15.0 g/dL   HCT 35.4 (L) 36.0 - 46.0 %   MCV 87.4 78.0 - 100.0 fL   MCH 27.9 26.0 - 34.0 pg   MCHC 31.9 30.0 - 36.0 g/dL   RDW 14.1 11.5 - 15.5 %   Platelets 385 150 - 400 K/uL  CBC     Status: Abnormal   Collection Time: 08/16/14  4:19 AM  Result Value Ref Range   WBC 6.8 4.0 - 10.5 K/uL   RBC 3.69 (L) 3.87 - 5.11 MIL/uL   Hemoglobin 10.2 (L) 12.0 - 15.0 g/dL   HCT 31.8 (L) 36.0  - 46.0 %   MCV 86.2 78.0 - 100.0 fL   MCH 27.6 26.0 - 34.0 pg   MCHC 32.1 30.0 - 36.0 g/dL   RDW 13.9 11.5 - 15.5 %   Platelets 353 150 - 400 K/uL  Basic metabolic panel     Status: Abnormal   Collection Time: 08/16/14  4:19 AM  Result Value Ref Range   Sodium 134 (L) 135 - 145 mmol/L   Potassium 4.4 3.5 - 5.1 mmol/L   Chloride 102 96 - 112 mmol/L   CO2 26 19 - 32 mmol/L   Glucose, Bld 138 (H) 70 - 99 mg/dL   BUN 17 6 - 23 mg/dL   Creatinine, Ser 0.79 0.50 - 1.10 mg/dL   Calcium 8.0 (L) 8.4 - 10.5 mg/dL   GFR calc non Af Amer 88 (L) >90 mL/min   GFR calc Af Amer >90 >90 mL/min    Comment: (NOTE) The eGFR has been calculated using the CKD EPI equation. This calculation has not been validated in all clinical situations. eGFR's persistently <90 mL/min signify possible Chronic Kidney Disease.    Anion gap 6 5 - 15    PATHOLOGY FINAL  Patient: TAWNEY, VANORMAN Collected: 07/27/2014 Client: [facility] Accession: [id number] Received: 07/27/2014 Rowe Robert, PA-[place] Adequacy Reason Satisfactory For Evaluation. Diagnosis PERITONEAL/ASCITIC FLUID (SPECIMEN 1 OF 1 COLLECTED 07/27/14): POSITIVE FOR CARCINOMA, SEE COMMENT. Willeen Niece MD  Specimen Clinical Information stage IV ovarian Ca Source Peritoneal/Ascitic Fluid, (specimen 1 of 1 collected 07/27/14) Gross Specimen: Received is/are 1,000 cc's of yellow fluid. (BS:bs)  Comment Clusters of tumor cells are present. Immunohistochemical stains are performed. The tumor cells are positive for WT-1, MOC-31, and cytokeratin 7. Cytokeratin 5/6 marks some background mesothelial cells but is negative in the tumor. Estrogen receptor, cytokeratin 20, and calretnin are negative. The findings are consistent with carcinoma and the morphology coupled with the staining pattern favors serous carcinoma. Most serous carcinomas are of primary gynecologic (adnexal) source with rare tumors being primary peritoneal  origin.   IMAGING  CHEST - 1 VIEW   08-11-14  COMPARISON: 08/02/2014  FINDINGS: No pneumothorax following right thoracentesis. Small pleural effusions remain, slightly larger on the left. Minor bibasilar compressive atelectasis. Normal heart size and vascularity. Trachea is midline. Postop changes in the left axilla.  IMPRESSION: Negative for pneumothorax following right thoracentesis.  Persistent small bilateral pleural effusions with compressive basilar atelectasis.  DISCUSSION: all of history above reviewed with patient. She has good understanding of chemotherapy from previous treatment of breast cancer; records requested from [place] (via [place]), however it does not seem that she has had either carboplatin or taxol with previous chemotherapy. Patient understands that chemotherapy may improve present cancer enough to allow debulking surgery, and hopefully will also control ascites and pleural effusions. Peripheral IV access appears adequate to begin treatment. Dr Denman George has requested prompt initiation of chemotherapy, which will be best accomplished in hospital, then can continue as outpatient at [facility]. Patient prefers treatment for now in [place], in order to coordinate care closely with gyn oncology.  I have mentioned q 3 week vs weekly carboplatin taxol regimens, and will begin with weekly treatments due in part to logistics of starting treatment promptly.  I have discussed on unit with Dr Lahoma Crocker and gyn onc NP, and have been in touch again by phone to let them know that chemo will begin in hospital. I have spoken with charge RN on oncology unit at [facility], as she will need transfer to that unit tomorrow if possible, to allow prompt initiation of chemotherapy on 08-18-14. I have spoken by phone with [facility] pharmacist and have entered chemotherapy orders into EPIC.Oncology nurse educator will meet with patient to do teaching later  today, per our conversation this AM. [facility] managed care notified that chemo + neupogen will continue outpatient. Appointments at [facility] requested.   Assessment/Plan: 1.IIIC vs IV high grade serous gyn carcinoma: diagnosis made from cytology of ascites + imaging and surgical findings, with omental biopsy pathology also pending. DIsease too extensive at exploratory laparotomy 08-15-14 to do debulking, so will begin chemotherapy as initial intervention. Treatment to be started urgently in hospital on 08-18-14 due to symptoms, surgical findings and timing of outpatient chemotherapy treatment availability. 2.history of stage 2 HER 2 + left breast cancer 2004: post lumpectomy and 9 node left axillary evaluation, chemotherapy with herceptin by Dr Sonny Dandy in [place], local radiation, and no known active disease since treatment completed. Up to date on mammograms, which I will also request for our records. 3.PAC required for chemotherapy in 2004 and may be needed again due to access limited to RUE, but venous access ok to begin with peripheral veins for now. 4.post parathyroidectomy 2005: normal calcium on chemistries 08-02-14 5.thyroid disease on synthroid 6.weight loss and poor po intake for past 2-3 months, related primarily to early satiety with ascites. Add Ensure (likes vanilla), may need dietician to assist. Add protonix for GERD symptoms now 7.consitpation: involvement of tumor in area of rectum. 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 Some HTN   Patient and family members have had all questions answered. Patient would like to begin treatment as promptly as possible.   LIVESAY,LENNIS P 08/16/2014, 11:01 AM  Pager [phone]

## 2014-08-16 NOTE — Telephone Encounter (Signed)
-----   Message from Gordy Levan, MD sent at 08/16/2014 11:11 AM EST ----- Please fax letter I have just done on this patient to HIM at [facility] fax [phone] attn Medical Records.  If you prefer fax other than gyn onc, fax, fine to write it in on the letter, otherwise please ask gyn onc to watch out for fax coming there  thanks

## 2014-08-16 NOTE — Progress Notes (Signed)
INITIAL NUTRITION ASSESSMENT  DOCUMENTATION CODES Per approved criteria  -Severe malnutrition in the context of chronic illness  Pt meets criteria for severe MALNUTRITION in the context of chronic illness as evidenced by 6% weight loss x <1 month and energy intake <75% for >/=1 month and severe muscle depletion.  INTERVENTION: -Continue Ensure Complete po BID, each supplement provides 350 kcal and 13 grams of protein -Encourage PO intake -RD to continue to monitor   NUTRITION DIAGNOSIS: Inadequate oral intake related to ascites as evidenced by 6% weight loss x <1 month.   Goal: Pt to meet >/= 90% of their estimated nutrition needs   Monitor:  PO and supplemental intake, weight, labs, I/O's  Reason for Assessment: Consult for nutritional assessment d/t poor PO  Admitting Dx: Ovarian cancer  ASSESSMENT: 64 y.o. year old with advanced stage (IV) ovarian cancer.  S/p Procedure(s): EXPLORATORY LAPAROTOMY (N/A) TOTAL HYSTERECTOMY ABDOMINAL (N/A) BILATERAL SALPINGO OOPHORECTOMY (Bilateral) OMENTECTOMY (N/A) DEBULKING WITH BOWEL RESECTION   Pt reports feeling very hungry since thoracentesis. Pt has had difficulty swallowing solids PTA. Pt states that she was eating very poorly since 1 week before Thanksgiving.  Pt tolerated liquids fine yesterday. Pt consumed a solid meal this AM, but states that "everything came back up". Pt ordered some soup for dinner, hoping she would tolerate this better for right now.  Encouraged pt to try eating slowly. Encouraged her to drink her Ensure supplements especially since she is unable to tolerate solids at this time.  Per weight history documentation, pt has lost 8 lb since 1/05 (6% weight loss x <1 month, significant for time frame).  Nutrition Focused Physical Exam:  Subcutaneous Fat:  Orbital Region: WNL Upper Arm Region: NA Thoracic and Lumbar Region: NA  Muscle:  Temple Region: moderate depletion Clavicle Bone Region: severe  depletion Clavicle and Acromion Bone Region: severe depletion Scapular Bone Region: WNL Dorsal Hand: WNL Patellar Region: mild depletion Anterior Thigh Region: WNL Posterior Calf Region: mild depletion  Edema: no LE edema  Labs reviewed:  Low Na  Height: Ht Readings from Last 1 Encounters:  08/15/14 5\' 2"  (1.575 m)    Weight: Wt Readings from Last 1 Encounters:  08/16/14 121 lb 11.1 oz (55.2 kg)    Ideal Body Weight: 110 lb  % Ideal Body Weight: 110%  Wt Readings from Last 10 Encounters:  08/16/14 121 lb 11.1 oz (55.2 kg)  08/10/14 122 lb 1.9 oz (55.393 kg)  07/25/14 129 lb 3.2 oz (58.605 kg)  12/01/11 152 lb (68.947 kg)  11/17/11 152 lb 4.8 oz (69.083 kg)    Usual Body Weight: 139 lb  % Usual Body Weight: 87%  BMI:  Body mass index is 22.25 kg/(m^2).  Estimated Nutritional Needs: Kcal: 1650-1850 Protein: 70-80g Fluid: 1.7L/day  Skin: abdominal incision  Diet Order: Diet regular  EDUCATION NEEDS: -No education needs identified at this time   Intake/Output Summary (Last 24 hours) at 08/16/14 1242 Last data filed at 08/16/14 1000  Gross per 24 hour  Intake 5137.5 ml  Output   4540 ml  Net  597.5 ml    Last BM: PTA  Labs:   Recent Labs Lab 08/16/14 0419  NA 134*  K 4.4  CL 102  CO2 26  BUN 17  CREATININE 0.79  CALCIUM 8.0*  GLUCOSE 138*    CBG (last 3)  No results for input(s): GLUCAP in the last 72 hours.  Scheduled Meds: . acetaminophen  1,000 mg Oral Q6H  . albumin human  12.5 g Intravenous 3 times per day  . [START ON 08/17/2014] dexamethasone  20 mg Oral 2 times per day on Thu Fri  . docusate sodium  100 mg Oral BID  . enoxaparin (LOVENOX) injection  40 mg Subcutaneous Q24H  . [COMPLETED] enoxaparin   Does not apply Once  . feeding supplement (ENSURE COMPLETE)  237 mL Oral BID BM  . feeding supplement (ENSURE COMPLETE)  237 mL Oral BID BM  . ketorolac  30 mg Intravenous 4 times per day   Or  . ketorolac  30 mg  Intramuscular 4 times per day  . levothyroxine  150 mcg Oral QAC breakfast  . pantoprazole (PROTONIX) IV  40 mg Intravenous Daily  . senna-docusate  2 tablet Oral QHS  . traMADol  100 mg Oral 4 times per day    Continuous Infusions: . dextrose 5 % and 0.45% NaCl 200 mL/hr at 08/16/14 1009    Past Medical History  Diagnosis Date  . Hypertension   . Breast cancer 2004    left breast lumpectomy  . Hypothyroidism     Past Surgical History  Procedure Laterality Date  . Left breast lumpectomy  2004  . Parathyroidectomy  2005  . Tubal ligation Bilateral   . Laparotomy N/A 08/15/2014    Procedure: EXPLORATORY LAPAROTOMY;  Surgeon: Everitt Amber, MD;  Location: [facility];  Service: Gynecology;  Laterality: N/A;  . Omentectomy N/A 08/15/2014    Procedure: biopsy of omentum;  Surgeon: Everitt Amber, MD;  Location: [facility];  Service: Gynecology;  Laterality: N/A;    Clayton Bibles, MS, RD, LDN Pager: [phone] After Hours Pager: [phone]

## 2014-08-16 NOTE — Telephone Encounter (Signed)
Labs/ov added per 01/27 POF, sent msg to add chemo, mailed sch to pt's address due to pt is in the hospital..... KJ

## 2014-08-16 NOTE — Telephone Encounter (Signed)
Per staff message and POF I have scheduled appts. Advised scheduler of appts. JMW  

## 2014-08-16 NOTE — Telephone Encounter (Signed)
Faxed Letter to Medical Records as requested to Dr. Marko Plume as noted below.  Dr. Mariana Kaufman fax number kept as [phone] as originally typed in letter.

## 2014-08-16 NOTE — Progress Notes (Signed)
1 Day Post-Op Procedure(s) (LRB): EXPLORATORY LAPAROTOMY (N/A) biopsy of omentum (N/A)  Subjective: Patient reports feeling well this am with minimal pain.  Reporting tolerating liquids with no difficulty swallowing since thoracentesis.  "Before, food would not go down but now I can eat and drink better."  Denies chest pain, dyspnea, passing flatus, nausea, emesis, or having a bowel movement.  BP low at times but asymptomatic.  Ambulating with assistance.  No concerns voiced.    Objective: Vital signs in last 24 hours: Temp:  [97.5 F (36.4 C)-98.7 F (37.1 C)] 98.7 F (37.1 C) (01/27 0556) Pulse Rate:  [73-102] 74 (01/27 0556) Resp:  [11-22] 14 (01/27 0556) BP: (86-147)/(56-97) 86/56 mmHg (01/27 0556) SpO2:  [92 %-100 %] 97 % (01/27 0556) FiO2 (%):  [40 %] 40 % (01/26 1555) Weight:  [121 lb 11.1 oz (55.2 kg)-122 lb (55.339 kg)] 121 lb 11.1 oz (55.2 kg) (01/27 0556)    Intake/Output from previous day: 01/26 0701 - 01/27 0700 In: 3637.5 [I.V.:3637.5] Out: 4440 [Urine:390; Blood:50]  Physical Examination: General: alert, cooperative and no distress Resp: clear but mild diminished in the bases, no crackles or rales noted Cardio: regular rate and rhythm, S1, S2 normal, no murmur, click, rub or gallop GI: incision: dressing removed, midline incision with staples without erythema or drainage and abdomen slightly distended, active bowel sounds, non-tender, soft Extremities: extremities normal, atraumatic, no cyanosis or edema Foley in place with 10 cc concentrated urine in collection chamber  Labs: WBC/Hgb/Hct/Plts:  6.8/10.2/31.8/353 (01/27 0419) BUN/Cr/glu/ALT/AST/amyl/lip:  17/0.79/--/--/--/--/-- (01/27 0419)  Assessment: 64 y.o. s/p Procedure(s): EXPLORATORY LAPAROTOMY, biopsy of omentum: stable Pain:  Pain is well-controlled on PRN medications.  Heme: Hgb 10.2 and Hct 31.8 this am.  Stable post-operatively.  CV: Hypotensive at times but asymptomatic.  Diuretic and Vasotec on  hold at this time.  GI:  Tolerating po: Yes, liquids.  GU: Decreased urine output.  390 cc urine output overnight.  Foley in place with concentrated urine.    FEN: Stable post-operatively.  Prophylaxis: pharmacologic prophylaxis (with any of the following: enoxaparin (Lovenox) 40mg  SQ 2 hours prior to surgery then every day) and intermittent pneumatic compression boots.  Plan: Advance diet as tolerated Nutritionist consult Dr. Marko Plume to see patient today to potentially begin chemotherapy prior to discharge from the hospital 500 cc NS bolus now Increase IVF rate to 200cc/hr of D51/2NS for one liter and then resume D51/2NS +63meq KCL at 125. Encourage ambulation, IS use, deep breathing, and coughing Continue post-operative plan of care per Dr. Delsa Sale Strict I&O.  Maintain foley until urinary output improves   LOS: 1 day    CROSS, MELISSA DEAL 08/16/2014, 10:36 AM

## 2014-08-16 NOTE — Progress Notes (Signed)
Pt with low urine output for the shift. Paged MD on call. New orders received. Will continue to monitor.

## 2014-08-17 ENCOUNTER — Inpatient Hospital Stay (HOSPITAL_COMMUNITY): Payer: 59

## 2014-08-17 DIAGNOSIS — K219 Gastro-esophageal reflux disease without esophagitis: Secondary | ICD-10-CM

## 2014-08-17 DIAGNOSIS — E871 Hypo-osmolality and hyponatremia: Secondary | ICD-10-CM

## 2014-08-17 LAB — APTT: aPTT: 39 seconds — ABNORMAL HIGH (ref 24–37)

## 2014-08-17 LAB — COMPREHENSIVE METABOLIC PANEL
ALBUMIN: 2.7 g/dL — AB (ref 3.5–5.2)
ALT: 90 U/L — AB (ref 0–35)
AST: 121 U/L — ABNORMAL HIGH (ref 0–37)
Alkaline Phosphatase: 73 U/L (ref 39–117)
Anion gap: 5 (ref 5–15)
BILIRUBIN TOTAL: 0.7 mg/dL (ref 0.3–1.2)
BUN: 12 mg/dL (ref 6–23)
CO2: 23 mmol/L (ref 19–32)
Calcium: 7.5 mg/dL — ABNORMAL LOW (ref 8.4–10.5)
Chloride: 101 mmol/L (ref 96–112)
Creatinine, Ser: 0.7 mg/dL (ref 0.50–1.10)
GFR calc Af Amer: >90 mL/min (ref >90)
GLUCOSE: 206 mg/dL — AB (ref 70–99)
Potassium: 3.8 mmol/L (ref 3.5–5.1)
Sodium: 129 mmol/L — ABNORMAL LOW (ref 135–145)
TOTAL PROTEIN: 5.3 g/dL — AB (ref 6.0–8.3)

## 2014-08-17 LAB — CBC WITH DIFFERENTIAL/PLATELET
Basophils Absolute: 0 10*3/uL (ref 0.0–0.1)
Basophils Relative: 0 % (ref 0–1)
EOS PCT: 1 % (ref 0–5)
Eosinophils Absolute: 0 10*3/uL (ref 0.0–0.7)
HCT: 33.5 % — ABNORMAL LOW (ref 36.0–46.0)
Hemoglobin: 10.7 g/dL — ABNORMAL LOW (ref 12.0–15.0)
LYMPHS ABS: 0.8 10*3/uL (ref 0.7–4.0)
Lymphocytes Relative: 13 % (ref 12–46)
MCH: 27.6 pg (ref 26.0–34.0)
MCHC: 31.9 g/dL (ref 30.0–36.0)
MCV: 86.6 fL (ref 78.0–100.0)
Monocytes Absolute: 0.7 10*3/uL (ref 0.1–1.0)
Monocytes Relative: 11 % (ref 3–12)
Neutro Abs: 5 10*3/uL (ref 1.7–7.7)
Neutrophils Relative %: 75 % (ref 43–77)
Platelets: 356 10*3/uL (ref 150–400)
RBC: 3.87 MIL/uL (ref 3.87–5.11)
RDW: 14 % (ref 11.5–15.5)
WBC: 6.6 10*3/uL (ref 4.0–10.5)

## 2014-08-17 LAB — PROTIME-INR
INR: 1.13 (ref 0.00–1.49)
Prothrombin Time: 14.7 seconds (ref 11.6–15.2)

## 2014-08-17 MED ORDER — CEFAZOLIN SODIUM-DEXTROSE 2-3 GM-% IV SOLR
INTRAVENOUS | Status: AC
  Filled 2014-08-17: qty 50

## 2014-08-17 MED ORDER — DEXAMETHASONE 6 MG PO TABS
20.0000 mg | ORAL_TABLET | ORAL | Status: AC
  Administered 2014-08-17 – 2014-08-18 (×2): 20 mg via ORAL
  Filled 2014-08-17 (×4): qty 1

## 2014-08-17 MED ORDER — SODIUM CHLORIDE 0.9 % IV SOLN
INTRAVENOUS | Status: DC
  Administered 2014-08-17 (×2): via INTRAVENOUS

## 2014-08-17 MED ORDER — FENTANYL CITRATE 0.05 MG/ML IJ SOLN
INTRAMUSCULAR | Status: AC | PRN
  Administered 2014-08-17: 25 ug via INTRAVENOUS
  Administered 2014-08-17: 50 ug via INTRAVENOUS
  Administered 2014-08-17: 25 ug via INTRAVENOUS

## 2014-08-17 MED ORDER — FENTANYL CITRATE 0.05 MG/ML IJ SOLN
INTRAMUSCULAR | Status: AC
  Filled 2014-08-17: qty 4

## 2014-08-17 MED ORDER — MIDAZOLAM HCL 2 MG/2ML IJ SOLN
INTRAMUSCULAR | Status: AC | PRN
  Administered 2014-08-17 (×2): 0.5 mg via INTRAVENOUS
  Administered 2014-08-17: 1 mg via INTRAVENOUS

## 2014-08-17 MED ORDER — MIDAZOLAM HCL 2 MG/2ML IJ SOLN
INTRAMUSCULAR | Status: AC
  Filled 2014-08-17: qty 6

## 2014-08-17 MED ORDER — LIDOCAINE HCL 1 % IJ SOLN
INTRAMUSCULAR | Status: AC
  Filled 2014-08-17: qty 20

## 2014-08-17 MED ORDER — CEFAZOLIN SODIUM-DEXTROSE 2-3 GM-% IV SOLR
2.0000 g | INTRAVENOUS | Status: AC
  Administered 2014-08-17: 2 g via INTRAVENOUS

## 2014-08-17 MED ORDER — SODIUM CHLORIDE 0.9 % IV SOLN
INTRAVENOUS | Status: DC
  Administered 2014-08-18: 04:00:00 via INTRAVENOUS

## 2014-08-17 MED ORDER — SUCRALFATE 1 GM/10ML PO SUSP
1.0000 g | Freq: Three times a day (TID) | ORAL | Status: DC
  Administered 2014-08-17 – 2014-08-19 (×8): 1 g via ORAL
  Filled 2014-08-17 (×15): qty 10

## 2014-08-17 NOTE — Sedation Documentation (Signed)
Local anesthetic being administered by Rowe Robert, PA for paracentesis.

## 2014-08-17 NOTE — Progress Notes (Addendum)
2 Days Post-Op Procedure(s) (LRB): EXPLORATORY LAPAROTOMY (N/A) biopsy of omentum (N/A)  Subjective: Patient reports tolerating liquids but having difficulty swallowing solid food (present pre-op before having paracentesis and thoracentesis).  Able to drink Ensure.  Passing some flatus.  Denies chest pain, dyspnea, nausea, emesis, or having a bowel movement.  BP low at times but asymptomatic.  Ambulating with assistance.  No concerns voiced.    Objective: Vital signs in last 24 hours: Temp:  [98.4 F (36.9 C)-98.7 F (37.1 C)] 98.7 F (37.1 C) (01/28 0549) Pulse Rate:  [72-88] 88 (01/28 0549) Resp:  [16-18] 16 (01/28 0549) BP: (93-102)/(60-68) 93/60 mmHg (01/28 0549) SpO2:  [92 %-99 %] 94 % (01/28 0549) Last BM Date: 08/15/14  Intake/Output from previous day: 01/27 0701 - 01/28 0700 In: 3370 [P.O.:480; I.V.:2390; IV Piggyback:500] Out: 700 [Urine:700]  Physical Examination: General: alert, cooperative and no distress Resp: clear but mild diminished in the bases, no crackles or rales noted Cardio: regular rate and rhythm, S1, S2 normal, no murmur, click, rub or gallop GI: incision: midline incision with staples without erythema or drainage and abdomen moderately distended and firm, active bowel sounds, non-tender  Extremities: extremities normal, atraumatic, no cyanosis or edema Foley in place with clear, yellow urine present  Labs: WBC/Hgb/Hct/Plts:  6.6/10.7/33.5/356 (01/28 2355) BUN/Cr/glu/ALT/AST/amyl/lip:  12/0.70/--/90/121/--/-- (01/28 7322)  Assessment: 64 y.o. s/p Procedure(s): EXPLORATORY LAPAROTOMY, biopsy of omentum: stable Pain:  Pain is well-controlled on PRN medications.  Heme: Hgb 10.7 and Hct 33.5 this am.  Stable post-operatively.  CV: Hypotensive at times but asymptomatic.  Diuretic and Vasotec on hold at this time.  GI:  Tolerating po: Yes, liquids.  GU: Improving urine output.  Foley to be discontinued today.    FEN: Na+ 129.  IVF changed to NS at  125 per Dr. Marko Plume  Prophylaxis: pharmacologic prophylaxis (with any of the following: enoxaparin (Lovenox) 40mg  SQ 2 hours prior to surgery then every day) and intermittent pneumatic compression boots.  Plan: Appreciate Medical Oncology Consult: Dr. Marko Plume For Port-a-cath placement and paracentesis today per Dr. Marko Plume Discontinue foley Plan to begin chemotherapy tomorrow and to be transferred to 3rd floor today in preparation Encourage ambulation, IS use, deep breathing, and coughing Continue post-operative plan of care per Dr. Delsa Sale and per Dr. Marko Plume   LOS: 2 days    Christean Silvestri DEAL 08/17/2014, 10:39 AM

## 2014-08-17 NOTE — Procedures (Signed)
US guided therapeutic paracentesis performed yielding 1.3 liters slightly turbid, amber fluid. No immediate complications.

## 2014-08-17 NOTE — Progress Notes (Signed)
2 Days Post-Op  Subjective: Pt c/o feeling tight in abdomen again secondary to ascites; familiar to our service from prior thora/paracenteses; request now received for port a cath placement and repeat paracentesis.   Objective: Vital signs in last 24 hours: Temp:  [98.4 F (36.9 C)-98.7 F (37.1 C)] 98.7 F (37.1 C) (01/28 0549) Pulse Rate:  [72-88] 88 (01/28 0549) Resp:  [16-18] 16 (01/28 0549) BP: (93-102)/(60-68) 93/60 mmHg (01/28 0549) SpO2:  [92 %-99 %] 94 % (01/28 0549) Last BM Date: 08/15/14  Intake/Output from previous day: 01/27 0701 - 01/28 0700 In: 3370 [P.O.:480; I.V.:2390; IV Piggyback:500] Out: 700 [Urine:700] Intake/Output this shift: Total I/O In: -  Out: 285 [Urine:285]  Pt awake/alert; chest- sl dim BS bases; heart- RRR; abd- dist,few BS, mildly tender; ext- FROM, no sig edema  Lab Results:   Recent Labs  08/16/14 0419 08/17/14 0605  WBC 6.8 6.6  HGB 10.2* 10.7*  HCT 31.8* 33.5*  PLT 353 356   BMET  Recent Labs  08/16/14 1610 08/17/14 0605  NA 134* 129*  K 3.6 3.8  CL 104 101  CO2 24 23  GLUCOSE 116* 206*  BUN 16 12  CREATININE 0.75 0.70  CALCIUM 7.7* 7.5*   PT/INR No results for input(s): LABPROT, INR in the last 72 hours. ABG No results for input(s): PHART, HCO3 in the last 72 hours.  Invalid input(s): PCO2, PO2  Studies/Results: No results found.  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 Treatment to be started urgently in hospital on 08-18-14  ;remote hx left breast cancer 2004 and prior right PAC. Request now received for port a cath placement and repeat paracentesis . Details/risks of procedures d/w pt with her understanding and consent. Procedures tent scheduled for later today.   LOS: 2 days    ALLRED,D [facility] 08/17/2014

## 2014-08-17 NOTE — Progress Notes (Signed)
Initial chemotherapy education started 08-16-14 and completed this morning.  Patient will read literature provided and chemotherapy consent will be obtained on 08-18-14.  Verbalized understanding and remembers some information from 2004.

## 2014-08-17 NOTE — Sedation Documentation (Signed)
Pt being prepped for paracentesis.

## 2014-08-17 NOTE — Progress Notes (Signed)
Pt with low urine output. Notified MD who said output is "ok" No new orders received. Pt is calm and resting. Will continue to monitor.

## 2014-08-17 NOTE — Progress Notes (Signed)
Medical Oncology 08-17-14  8:08 AM  Hospital day 3 Antibiotics: none Chemotherapy: day 1 cycle 1 dose dense carbo taxol planned for 08-18-14  EMR reviewed. Patient seen and examined, son sleeping at bedside. Spoke with IR and with unit RN. Patient to transfer to oncology unit later today when bed available, in anticipation of chemotherapy on 08-18-14.  Subjective: Slept a little last pm, difficult with lots of nursing activity. Indigestion continues, abdomen feels more full, vomited after trying regular diet x 1 yesterday then kept down liquids last pm. Denies SOB. Was up to BR to bathe yesterday. Some bowel rumbling, nothing per rectum. Right arm with IV some swelling this AM. Foley still in - hopes it can DC soon. Pain controlled. Denies SOB.  Chemotherapy teaching done by education RN yesterday. We have discussed including possible side effects, and she is in agreement with proceeding as we have planned tomorrow. Prior chemo records from 2004 - 2005 requested yesterday from [facility]/ [place], but not yet received.   She would be glad to have PAC, as was used for her chemotherapy in 2004. With IV site problems this AM and as unable to use LUE (due to previous 9 axillary node dissection and radiation), I have spoken with IR now and hopefully she can be added to schedule this afternoon likely 3:00 or later.  Also have suggested repeat paracentesis by IR, in hopes of keeping her more comfortable thru weekend especially if able to DC home ~ Sat.   ONCOLOGIC HISTORY  GYN MALIGNANCY:Patient became symptomatic with abdominal swelling and constipation in late Nov 2015, seen and treated initially at [facility] ED and by PCP Dr Cleda Mccreedy for what was thought to be ulcer. Symptoms continued, with early satiety causing poor po intake; she was seen again at [facility] ED in early Jan 2016, with CT reportedly consistent with stage III or IV gyn cancer,  including omental caking, large pelvic mass, para-aortic adenopathy, ascites, pleural effusions and right subpleural nodule. She was seen in consultation by Dr Denman George on 07-25-14, with concern from her exam and scan for rectal involvement. 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 She had paracentesis of 3.4 liters ascites on 07-27-2014, with cytology ([id number]) showing serous carcinoma consistent with gyn primary. She had right thoracentesis for 350 cc on 08-11-14, this requested by anesthesia prior to surgery and apparently not sent for cytology. Patient was more comfortable after paracentesis, but could not tell any difference in breathing after thoracentesis (denies SOB prior to that procedure). CA 125 on 08-02-14 was 1162. She was taken to exploratory laparotomy by Dr Denman George on 08-15-14, with 4 liters of ascites removed and biopsy of omentum obtained, with additional debulking not attempted due to surgical findings of entire peritoneal surface replaced by tumor plaque, small intestine coated with tumor and mesentery tethered with tumor nodules, ileum adherent to pelvic mass, dense plaque of tumor on right diaphragm and 10 cm omental cake in LUQ.   LEFT BREAST CANCER treated with chemotherapy + herceptin for stage 2 left breast cancer by Dr Marcene Duos ~ 2004. She had left lumpectomy and 9 left axillary nodes removed, was not on oral hormonal blocker, did have radiation also in [place], had PAC for chemo that was removed subsequently. She did not have adriamycin, recalls that she tolerated the chemotherapy well, but does not remember which agents other than herceptin were used.  Last mammograms were in [facility]  August 2015, reportedly not remarkable. She is no longer followed at [facility] in [place]. She does not know that she had genetics testing.   Objective: Vital signs in last 24 hours: Blood pressure 93/60, pulse 88, temperature 98.7 F (37.1  C), temperature source Oral, resp. rate 16, height 5\' 2"  (1.575 m), weight 121 lb 11.1 oz (55.2 kg), SpO2 94 %.   Intake/Output from previous day: 01/27 0701 - 01/28 0700 In: 3370 [P.O.:480; I.V.:2390; IV Piggyback:500] Out: 700 [Urine:700] Intake/Output this shift:  IVF has been D5 0.45 NS at 200/ hr since last PM, D5 0.45 prior to that. I have changed now to NS at 125.  Physical exam: in bed, awake, alert and appropriate. PERRL, not icteric. Oral mucosa moist. No JVD at 30 degrees. RIght arm puffy to elbow with IV at wrist, site ok. No swelling LUE. Lungs without wheezes or rales, respirations not labored RA. Heart RRR. Abdomen more distended than yesterday, nearly tight, quiet. LE no edema, cords, tenderness. Foley in. Moves all extremities easily.  Lab Results:  Recent Labs  08/16/14 0419 08/17/14 0605  WBC 6.8 6.6  HGB 10.2* 10.7*  HCT 31.8* 33.5*  PLT 353 356   BMET  Recent Labs  08/16/14 1610 08/17/14 0605  NA 134* 129*  K 3.6 3.8  CL 104 101  CO2 24 23  GLUCOSE 116* 206*  BUN 16 12  CREATININE 0.75 0.70  CALCIUM 7.7* 7.5*   Hyponatremia likely with hypotonic fluids and third spacing into ascites. Have changed to NS now and will follow labs.  Studies/Results: No results found. 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 DIsease too extensive at exploratory laparotomy 08-15-14 to do debulking, so will begin chemotherapy as initial intervention. Treatment to be started urgently in hospital on 08-18-14 due to symptoms, surgical findings and timing of outpatient chemotherapy treatment availability. Chemo teaching done, transfer to oncology unit planned. 2.history of stage 2 HER 2 + left breast cancer 2004: post lumpectomy and 9 node left axillary evaluation, chemotherapy with herceptin by Dr  Sonny Dandy in [place] at [place], local radiation, and no known active disease since treatment completed. Up to date on mammograms, which I will also request for our records. 3.Problems with RUE venous access this AM and unable to use LUE due to previous breast cancer interventions. PAC requested now, help from IR much appreciated. She had PAC for chemotherapy in 2004  4. Hyponatremia: probably hypotonic IVF + third spacing with ascites. Change to NS now and follow.  5.post parathyroidectomy 2005: normal calcium on chemistries 08-02-14. Thyroid disease on synthroid 6.weight loss and poor po intake for past 2-3 months, meets criteria for severe malnutrition in context of chronic illness:  related primarily to early satiety with ascites. Add Ensure (likes vanilla). Hospital dietician involved. Unfortunately will need to be NPO for [facility] for now- 7.consitpation: involvement of tumor in area of rectum. 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 Some HTN by history, BPs lower now likely again with third spacing fluid into ascites; NS. 9.Reflux: IV protonix begun yesterday, have added carafate as suspension now.   Rozelle Caudle P Pager [phone]

## 2014-08-18 DIAGNOSIS — C786 Secondary malignant neoplasm of retroperitoneum and peritoneum: Secondary | ICD-10-CM

## 2014-08-18 DIAGNOSIS — R180 Malignant ascites: Secondary | ICD-10-CM

## 2014-08-18 DIAGNOSIS — C801 Malignant (primary) neoplasm, unspecified: Secondary | ICD-10-CM

## 2014-08-18 LAB — COMPREHENSIVE METABOLIC PANEL
ALT: 84 U/L — AB (ref 0–35)
ANION GAP: 7 (ref 5–15)
AST: 76 U/L — ABNORMAL HIGH (ref 0–37)
Albumin: 2.4 g/dL — ABNORMAL LOW (ref 3.5–5.2)
Alkaline Phosphatase: 108 U/L (ref 39–117)
BUN: 10 mg/dL (ref 6–23)
CHLORIDE: 106 mmol/L (ref 96–112)
CO2: 22 mmol/L (ref 19–32)
Calcium: 8.1 mg/dL — ABNORMAL LOW (ref 8.4–10.5)
Creatinine, Ser: 0.63 mg/dL (ref 0.50–1.10)
GFR calc Af Amer: >90 mL/min (ref >90)
Glucose, Bld: 147 mg/dL — ABNORMAL HIGH (ref 70–99)
Potassium: 4.5 mmol/L (ref 3.5–5.1)
SODIUM: 135 mmol/L (ref 135–145)
Total Bilirubin: 0.4 mg/dL (ref 0.3–1.2)
Total Protein: 5.5 g/dL — ABNORMAL LOW (ref 6.0–8.3)

## 2014-08-18 MED ORDER — HEPARIN SOD (PORK) LOCK FLUSH 100 UNIT/ML IV SOLN: 250.0000 [IU] | Freq: Once | INTRAVENOUS | Status: AC | PRN

## 2014-08-18 MED ORDER — SODIUM CHLORIDE 0.9 % IV SOLN
Freq: Once | INTRAVENOUS | Status: AC
  Administered 2014-08-18: 36 mg via INTRAVENOUS
  Filled 2014-08-18: qty 8

## 2014-08-18 MED ORDER — SODIUM CHLORIDE 0.9 % IJ SOLN: 10.0000 mL | INTRAMUSCULAR | Status: DC | PRN

## 2014-08-18 MED ORDER — LORAZEPAM 0.5 MG PO TABS: 0.5000 mg | ORAL_TABLET | Freq: Four times a day (QID) | ORAL | Status: DC | PRN

## 2014-08-18 MED ORDER — FAMOTIDINE IN NACL 20-0.9 MG/50ML-% IV SOLN
20.0000 mg | Freq: Once | INTRAVENOUS | Status: AC
  Administered 2014-08-18: 20 mg via INTRAVENOUS
  Filled 2014-08-18: qty 50

## 2014-08-18 MED ORDER — ALTEPLASE 2 MG IJ SOLR: 2.0000 mg | Freq: Once | INTRAMUSCULAR | Status: AC | PRN

## 2014-08-18 MED ORDER — SODIUM CHLORIDE 0.9 % IV SOLN: Freq: Once | INTRAVENOUS | Status: AC | PRN

## 2014-08-18 MED ORDER — DIPHENHYDRAMINE HCL 50 MG/ML IJ SOLN
25.0000 mg | Freq: Once | INTRAMUSCULAR | Status: AC | PRN
Start: 2014-08-18 — End: 2014-08-18

## 2014-08-18 MED ORDER — EPINEPHRINE HCL 0.1 MG/ML IJ SOSY: 0.2500 mg | PREFILLED_SYRINGE | Freq: Once | INTRAMUSCULAR | Status: AC | PRN

## 2014-08-18 MED ORDER — EPINEPHRINE HCL 1 MG/ML IJ SOLN: 0.5000 mg | Freq: Once | INTRAMUSCULAR | Status: AC | PRN

## 2014-08-18 MED ORDER — SODIUM CHLORIDE 0.9 % IJ SOLN: 3.0000 mL | INTRAMUSCULAR | Status: DC | PRN

## 2014-08-18 MED ORDER — COLD PACK MISC ONCOLOGY
1.0000 | Freq: Once | Status: AC | PRN
  Filled 2014-08-18: qty 1

## 2014-08-18 MED ORDER — FAMOTIDINE IN NACL 20-0.9 MG/50ML-% IV SOLN: 20.0000 mg | Freq: Once | INTRAVENOUS | Status: AC | PRN

## 2014-08-18 MED ORDER — DIPHENHYDRAMINE HCL 50 MG/ML IJ SOLN
50.0000 mg | Freq: Once | INTRAMUSCULAR | Status: AC
  Administered 2014-08-18: 50 mg via INTRAVENOUS
  Filled 2014-08-18: qty 1

## 2014-08-18 MED ORDER — ENSURE COMPLETE PO LIQD
237.0000 mL | Freq: Three times a day (TID) | ORAL | Status: DC
  Administered 2014-08-18: 237 mL via ORAL

## 2014-08-18 MED ORDER — DIPHENHYDRAMINE HCL 50 MG/ML IJ SOLN
50.0000 mg | Freq: Once | INTRAMUSCULAR | Status: AC | PRN
  Filled 2014-08-18: qty 1

## 2014-08-18 MED ORDER — PACLITAXEL CHEMO INJECTION 300 MG/50ML
80.0000 mg/m2 | Freq: Once | INTRAVENOUS | Status: AC
  Administered 2014-08-18: 126 mg via INTRAVENOUS
  Filled 2014-08-18: qty 21

## 2014-08-18 MED ORDER — SODIUM CHLORIDE 0.9 % IV SOLN
Freq: Once | INTRAVENOUS | Status: AC
  Administered 2014-08-18: 09:00:00 via INTRAVENOUS

## 2014-08-18 MED ORDER — SODIUM CHLORIDE 0.9 % IV SOLN
447.0000 mg | Freq: Once | INTRAVENOUS | Status: AC
  Administered 2014-08-18: 450 mg via INTRAVENOUS
  Filled 2014-08-18: qty 45

## 2014-08-18 MED ORDER — METHYLPREDNISOLONE SODIUM SUCC 125 MG IJ SOLR: 125.0000 mg | Freq: Once | INTRAMUSCULAR | Status: AC | PRN

## 2014-08-18 MED ORDER — DEXTROSE 5 % IV SOLN
INTRAVENOUS | Status: DC
Start: 2014-08-18 — End: 2014-08-19
  Administered 2014-08-18: 20:00:00 via INTRAVENOUS

## 2014-08-18 MED ORDER — HEPARIN SOD (PORK) LOCK FLUSH 100 UNIT/ML IV SOLN: 500.0000 [IU] | Freq: Once | INTRAVENOUS | Status: AC | PRN

## 2014-08-18 MED ORDER — ALBUTEROL SULFATE (2.5 MG/3ML) 0.083% IN NEBU: 2.5000 mg | INHALATION_SOLUTION | Freq: Once | RESPIRATORY_TRACT | Status: AC | PRN

## 2014-08-18 NOTE — Progress Notes (Signed)
3 Days Post-Op Procedure(s) (LRB): EXPLORATORY LAPAROTOMY (N/A) biopsy of omentum (N/A)  Subjective: Patient reports tolerating liquids, passing flatus and stool. Feeling much better today. Pain well controlled.  Ambulating with assistance.  No concerns voiced.    Objective: Vital signs in last 24 hours: Temp:  [98.3 F (36.8 C)-98.5 F (36.9 C)] 98.4 F (36.9 C) (01/29 0454) Pulse Rate:  [82-127] 82 (01/29 0454) Resp:  [13-18] 18 (01/29 0454) BP: (93-138)/(52-83) 119/78 mmHg (01/29 0454) SpO2:  [93 %-96 %] 94 % (01/29 0454) Weight:  [122 lb (55.339 kg)-137 lb 9.1 oz (62.4 kg)] 133 lb 9.6 oz (60.601 kg) (01/29 0806) Last BM Date: 08/17/14  Intake/Output from previous day: 01/28 0701 - 01/29 0700 In: -  Out: 635 [Urine:635]  Physical Examination: General: alert, cooperative and no distress Resp: clear but mild diminished in the bases, no crackles or rales noted Cardio: regular rate and rhythm, S1, S2 normal, no murmur, click, rub or gallop GI: incision: midline incision with staples without erythema or drainage and abdomen moderately distended and firm, active bowel sounds, non-tender  Extremities: extremities normal, atraumatic, no cyanosis or edema Foley in place with clear, yellow urine present  Labs:   BUN/Cr/glu/ALT/AST/amyl/lip:  10/0.63/--/84/76/--/-- (01/29 0605)  Assessment: 64 y.o. s/p Procedure(s): EXPLORATORY LAPAROTOMY, biopsy of omentum: stable Pain:  Pain is well-controlled on PRN medications.  Heme: Hgb 10.7 and Hct 33.5 yesterday.  Stable post-operatively.  CV: Improved hemodynamics with prior volume resuscitation  GI:  Tolerating po: Yes, liquids. Will advance to regular diet.  GU: Improved urine output.  Foley discontinued yesterday/  FEN: Na+ 129.  IVF changed to NS at 125 per Dr. Marko Plume  ONC: chemotherapy today as inpatient with carboplatin and paclitaxel.  Prophylaxis: pharmacologic prophylaxis (with any of the following: enoxaparin (Lovenox)  40mg  SQ 2 hours prior to surgery then every day) and intermittent pneumatic compression boots.  Plan: Appreciate Medical Oncology Consult: Dr. Marko Plume General diet. Encourage ambulation, IS use, deep breathing, and coughing Chemotherapy today. Plan for discharge to home tomorrow.   LOS: 3 days    [name] 08/18/2014, 11:27 AM

## 2014-08-18 NOTE — Progress Notes (Signed)
Pt received Taxol and Carbol without complications. Will continue to monitor.

## 2014-08-18 NOTE — Progress Notes (Addendum)
Dosing for Carboplatin and Paclitaxel verified via manual calculation including manual verification of BSA.  Second nurse confirmation with Laural Benes RN

## 2014-08-18 NOTE — Progress Notes (Signed)
MEDICAL ONCOLOGY  08-18-14, 8:49 AM  Hospital day 4 Antibiotics: none Chemotherapy: day 1 cycle 1 dose dense carbo taxol 08-18-14  Spoke with pharmacist on unit now, and with oncology liason RN also on unit.  Subjective: Patient seen, no visitors here now, transferred to oncology unit yesterday for planned start of urgent chemotherapy today. PAC placed by Dr Kathlene Cote yesterday without difficulty, functioning well for IVF and blood draw this AM. IR also did paracentesis for 1.3 liters again on 08-17-14, again helped such that she was able to take po liquids last pm, and liquids + grits this AM. Wants more solid food, diet changed to regular now; has not had any Ensure, tho this was ordered on 1-28 (when NPO for procedure) and I have increased to tid now. Tolerating up to BR to void this AM and bowels moved x 3 since last pm, loose, first BM since prior to surgery. Staples pulling but otherwise no significant abdominal discomfort. Slept a little, overall feels some stronger today. Denies SOB. Carafate given once yesterday, helped GERD, but uncomfortable again this am after salty broth.   ONCOLOGIC HISTORY GYN MALIGNANCY:Patient became symptomatic with abdominal swelling and constipation in late Nov 2015, seen and treated initially at [facility] ED and by PCP Dr Cleda Mccreedy for what was thought to be ulcer. Symptoms continued, with early satiety causing poor po intake; she was seen again at [facility] ED in early Jan 2016, with CT reportedly consistent with stage III or IV gyn cancer, including omental caking, large pelvic mass, para-aortic adenopathy, ascites, pleural effusions and right subpleural nodule. She was seen in consultation by Dr Denman George on 07-25-14, with concern from her exam and scan for rectal involvement. 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 She had right thoracentesis for 350 cc on 08-11-14, this requested by anesthesia prior to surgery and apparently not sent for cytology. Patient was more comfortable after paracentesis, but could not tell any difference in breathing after thoracentesis (denies SOB prior to that procedure). CA 125 on 08-02-14 was 1162. She was taken to exploratory laparotomy by Dr Denman George on 08-15-14, with 4 liters of ascites removed and biopsy of omentum obtained, with additional debulking not attempted due to surgical findings of entire peritoneal surface replaced by tumor plaque, small intestine coated with tumor and mesentery tethered with tumor nodules, ileum adherent to pelvic mass, dense plaque of tumor on right diaphragm and 10 cm omental cake in LUQ.   LEFT BREAST CANCER treated with chemotherapy + herceptin for stage 2 left breast cancer by Dr Marcene Duos ~ 2004. She had left lumpectomy and 9 left axillary nodes removed, was not on oral hormonal blocker, did have radiation also in [place], had PAC for chemo that was removed subsequently. She did not have adriamycin, recalls that she tolerated the chemotherapy well, but does not remember which agents other than herceptin were used.  Last mammograms were in [place] August 2015, reportedly not remarkable. She is no longer followed at [facility] in [place]. She does not know that she had genetics testing.  I have not received past chemo records as yet from [facility], will follow up if do not receive these today.    Objective: Vital signs in last 24 hours: Blood pressure 119/78, pulse 82, temperature 98.4 F (36.9 C), temperature source  Oral, resp. rate 18, height 5\' 2"  (1.575 m), weight 133 lb 9.6 oz (60.601 kg), SpO2 94 %.   Intake/Output from previous day: 01/28 0701 - 01/29 0700 In: -  Out: 635 [Urine:635] Intake/Output this shift:  IVF NS at 75 since last  pm.  Physical exam: Awake, alert, looks more comfortable than yesterday. Mild periorbital edema, oral mucosa moist without thrush. PAC accessed and infusing well, minimal bruising/ swelling. Lungs without wheezes or rales. Heart RRR no gallop. Abdomen softer, less distended, some BS, not tender to gentle exam. Staples in, few mm area erythema at inferior incision. LE no pitting edema, cords,, tenderness, PAS on. Moves all extremities easily. Appropriate mood and affect. Lab Results:  Recent Labs  08/16/14 0419 08/17/14 0605  WBC 6.8 6.6  HGB 10.2* 10.7*  HCT 31.8* 33.5*  PLT 353 356   BMET  Recent Labs  08/17/14 0605 08/18/14 0605  NA 129* 135  K 3.8 4.5  CL 101 106  CO2 23 22  GLUCOSE 206* 147*  BUN 12 10  CREATININE 0.70 0.63  CALCIUM 7.5* 8.1*   Full CMET noted and discussed with pharmacist, ok to proceed with chemo. Note Tprot and albumin low with poor po intake x 2-3 months and paracenteses/ thoracentesis    MEDICATIONS: reviewed in EMR. Discussed with pharmacist fact that she has had only the 12 hour prior decadron dose; will either give the next dose now depending on timing of chemo, or just the IV dose with chemo premeds. Needs to continue qid carafate and protonix.  Was not on any antibiotics prior to this admission.   Studies/Results: Ir Fluoro Guide Cv Line Right  08/17/2014   CLINICAL DATA:  Metastatic serous carcinoma of gynecologic origin. The patient requires a porta cath to begin chemotherapy.  EXAM: IMPLANTED PORT A CATH PLACEMENT WITH ULTRASOUND AND FLUOROSCOPIC GUIDANCE  ANESTHESIA/SEDATION: 2.0 Mg IV Versed; 100 mcg IV Fentanyl  Total Moderate Sedation Time:  30 minutes  Additional Medications: 2 g IV Ancef. As antibiotic prophylaxis, Ancef was ordered pre-procedure and administered intravenously within one hour of incision.  FLUOROSCOPY TIME:  24 seconds.  PROCEDURE: The procedure, risks, benefits, and alternatives were explained to the patient. Questions  regarding the procedure were encouraged and answered. The patient understands and consents to the procedure.  The right neck and chest were prepped with chlorhexidine in a sterile fashion, and a sterile drape was applied covering the operative field. Maximum barrier sterile technique with sterile gowns and gloves were used for the procedure. Local anesthesia was provided with 1% lidocaine. A time-out was performed prior to the procedure.  Ultrasound was used to confirm patency of the right internal jugular vein. After creating a small venotomy incision, a 21 gauge needle was advanced into the right internal jugular vein under direct, real-time ultrasound guidance. Ultrasound image documentation was performed. After securing guidewire access, an 8 Fr dilator was placed. A J-wire was kinked to measure appropriate catheter length.  A subcutaneous port pocket was then created along the upper chest wall utilizing sharp and blunt dissection. Portable cautery was utilized. The pocket was irrigated with sterile saline.  A single lumen power injectable port was chosen for placement. The 8 Fr catheter was tunneled from the port pocket site to the venotomy incision. The port was placed in the pocket and secured with two Ethilon tacking sutures. External catheter was trimmed to appropriate length based on guidewire measurement.  At the venotomy, an 8 Fr peel-away sheath was placed over  a guidewire. The catheter was then placed through the sheath and the sheath removed. Final catheter positioning was confirmed and documented with a fluoroscopic spot image. The port was accessed with a needle and aspirated and flushed with saline. The needle was left in.  The venotomy and port pocket incisions were closed with subcutaneous 3-0 Monocryl and subcuticular 4-0 Vicryl. Dermabond was applied to both incisions.  COMPLICATIONS: None  FINDINGS: After catheter placement, the tip lies at the cavoatrial junction. The catheter aspirates  normally and is ready for immediate use.  IMPRESSION: Placement of single lumen port a cath via right internal jugular vein. The catheter tip lies at the cavoatrial junction. A power injectable port a cath was placed and is ready for immediate use.   Electronically Signed   By: Aletta Edouard M.D.   On: 08/17/2014 16:04   Ir US Guide Vasc Access Right  08/17/2014   CLINICAL DATA:  Metastatic serous carcinoma of gynecologic origin. The patient requires a porta cath to begin chemotherapy.  EXAM: IMPLANTED PORT A CATH PLACEMENT WITH ULTRASOUND AND FLUOROSCOPIC GUIDANCE  ANESTHESIA/SEDATION: 2.0 Mg IV Versed; 100 mcg IV Fentanyl  Total Moderate Sedation Time:  30 minutes  Additional Medications: 2 g IV Ancef. As antibiotic prophylaxis, Ancef was ordered pre-procedure and administered intravenously within one hour of incision.  FLUOROSCOPY TIME:  24 seconds.  PROCEDURE: The procedure, risks, benefits, and alternatives were explained to the patient. Questions regarding the procedure were encouraged and answered. The patient understands and consents to the procedure.  The right neck and chest were prepped with chlorhexidine in a sterile fashion, and a sterile drape was applied covering the operative field. Maximum barrier sterile technique with sterile gowns and gloves were used for the procedure. Local anesthesia was provided with 1% lidocaine. A time-out was performed prior to the procedure.  Ultrasound was used to confirm patency of the right internal jugular vein. After creating a small venotomy incision, a 21 gauge needle was advanced into the right internal jugular vein under direct, real-time ultrasound guidance. Ultrasound image documentation was performed. After securing guidewire access, an 8 Fr dilator was placed. A J-wire was kinked to measure appropriate catheter length.  A subcutaneous port pocket was then created along the upper chest wall utilizing sharp and blunt dissection. Portable cautery was  utilized. The pocket was irrigated with sterile saline.  A single lumen power injectable port was chosen for placement. The 8 Fr catheter was tunneled from the port pocket site to the venotomy incision. The port was placed in the pocket and secured with two Ethilon tacking sutures. External catheter was trimmed to appropriate length based on guidewire measurement.  At the venotomy, an 8 Fr peel-away sheath was placed over a guidewire. The catheter was then placed through the sheath and the sheath removed. Final catheter positioning was confirmed and documented with a fluoroscopic spot image. The port was accessed with a needle and aspirated and flushed with saline. The needle was left in.  The venotomy and port pocket incisions were closed with subcutaneous 3-0 Monocryl and subcuticular 4-0 Vicryl. Dermabond was applied to both incisions.  COMPLICATIONS: None  FINDINGS: After catheter placement, the tip lies at the cavoatrial junction. The catheter aspirates normally and is ready for immediate use.  IMPRESSION: Placement of single lumen port a cath via right internal jugular vein. The catheter tip lies at the cavoatrial junction. A power injectable port a cath was placed and is ready for immediate use.   Electronically Signed  By: Aletta Edouard M.D.   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  EXAM: ULTRASOUND-GUIDED THERAPEUTIC PARACENTESIS  COMPARISON:  None.  MEDICATIONS: None.  COMPLICATIONS: None immediate  TECHNIQUE: Informed written consent was obtained from the patient after a discussion of the risks, benefits and alternatives to treatment. A timeout was performed prior to the initiation of the procedure.  Initial ultrasound scanning demonstrates a small amount of ascites within the right lower abdominal quadrant. The right lower abdomen was prepped and  draped in the usual sterile fashion. 1% lidocaine was used for local anesthesia. Under direct ultrasound guidance, a 19 gauge, 7-cm, Yueh catheter was introduced. An ultrasound image was saved for documentation purposed. The paracentesis was performed. The catheter was removed and a dressing was applied. The patient tolerated the procedure well without immediate post procedural complication.  FINDINGS: A total of approximately 1.3 liters of slightly turbid, amber fluid was removed.  IMPRESSION: Successful ultrasound-guided paracentesis yielding 1.3 liters of peritoneal fluid.  Read by: Rowe Robert, PA-C   Electronically Signed   By: Aletta Edouard M.D.   On: 08/17/2014 16:39    PATHOLOGY Carel, Avanell M Collected: 08/15/2014 Client: [facility] Accession: [id number] Received: 08/15/2014 Everitt Amber, MDCAL PATHOLOGY FINAL DIAGNOSIS Diagnosis Omentum, biopsy - METASTATIC HIGH GRADE CARCINOMA, SEE COMMENT. Microscopic Comment The previous peritoneal fluid demonstrating malignant cells consistent with high grade serous carcinoma is noted ([id number]). In the current case, there is extensive involvement of fibrofatty soft tissue by metastatic high grade carcinoma with rare psammoma-type calcifications. Morphologic features are consistent with high grade serous carcinoma; likely of gynecologic/adnexal origin.     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 DIsease too extensive at exploratory laparotomy 08-15-14 to do debulking, so will begin chemotherapy as initial intervention. Treatment to be started urgently in hospital on 08-18-14 due to symptoms, surgical findings and timing of outpatient chemotherapy treatment availability. Chemo teaching done, transferred to oncology unit, PAC placed due to inadequate peripheral IV access. She has  had one dose of premed steroids as noted. Chemo to begin later this AM. Oncology liason RN to get signed consent now.  2.PAC placed by IR 08-17-14 3.hyponatremia corrected with change in IVF. Continue NS at 75 now, may be able to decrease rate this pm if stable and taking po's after chemo. 4.stage 2 HER 2 + left breast cancer 2004: post lumpectomy and 9 node left axillary evaluation, chemotherapy with herceptin by Dr Sonny Dandy in [place], local radiation, and no known active disease since treatment completed. Prior chemo records requested. Up to date on mammograms, which I will also request for our records. 5.severe malnutrition in context of chronic illness: needs Ensure as ordered. Nutritionist involved. Diet advanced. 6.constipation resolved: tumor in proximity to rectum. 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 BP better with NS but would still hold antihypertensives for now 8.GERD: better with one dose carafate given thus far. Continue carafate and protonix (could change protonix to po this pm if not much chemo nausea) 9.post parathyroidectomy 2005  I expect she will be ok for DC home from med onc standpoint tomorrow. I will round on her also in AM tomorrow. She will  see me back at [facility] with labs on Thurs 08-24-14, with second low dose taxol after MD that day.  Page me if needed prior to my next rounds   [phone] Gordy Levan MD

## 2014-08-19 ENCOUNTER — Inpatient Hospital Stay (HOSPITAL_COMMUNITY): Payer: 59

## 2014-08-19 LAB — BASIC METABOLIC PANEL
Anion gap: 6 (ref 5–15)
BUN: 12 mg/dL (ref 6–23)
CALCIUM: 7.7 mg/dL — AB (ref 8.4–10.5)
CHLORIDE: 105 mmol/L (ref 96–112)
CO2: 24 mmol/L (ref 19–32)
Creatinine, Ser: 0.66 mg/dL (ref 0.50–1.10)
GLUCOSE: 130 mg/dL — AB (ref 70–99)
POTASSIUM: 4.3 mmol/L (ref 3.5–5.1)
Sodium: 135 mmol/L (ref 135–145)

## 2014-08-19 LAB — TYPE AND SCREEN
ABO/RH(D): B POS
ANTIBODY SCREEN: NEGATIVE
Unit division: 0
Unit division: 0

## 2014-08-19 LAB — URINALYSIS, ROUTINE W REFLEX MICROSCOPIC
BILIRUBIN URINE: NEGATIVE
GLUCOSE, UA: NEGATIVE mg/dL
Hgb urine dipstick: NEGATIVE
KETONES UR: NEGATIVE mg/dL
Leukocytes, UA: NEGATIVE
Nitrite: NEGATIVE
Protein, ur: NEGATIVE mg/dL
Specific Gravity, Urine: 1.019 (ref 1.005–1.030)
Urobilinogen, UA: 0.2 mg/dL (ref 0.0–1.0)
pH: 6 (ref 5.0–8.0)

## 2014-08-19 MED ORDER — ACETAMINOPHEN 325 MG PO TABS: 325.0000 mg | ORAL_TABLET | Freq: Four times a day (QID) | ORAL | Status: DC | PRN

## 2014-08-19 MED ORDER — DOCUSATE SODIUM 100 MG PO CAPS: 100.0000 mg | ORAL_CAPSULE | Freq: Two times a day (BID) | ORAL | Status: DC

## 2014-08-19 MED ORDER — FUROSEMIDE 20 MG PO TABS
10.0000 mg | ORAL_TABLET | Freq: Every day | ORAL | Status: DC
  Administered 2014-08-19: 10 mg via ORAL
  Filled 2014-08-19: qty 0.5

## 2014-08-19 MED ORDER — ONDANSETRON HCL 8 MG PO TABS: 8.0000 mg | ORAL_TABLET | Freq: Three times a day (TID) | ORAL | Status: DC | PRN

## 2014-08-19 MED ORDER — TRAMADOL HCL 50 MG PO TABS: 100.0000 mg | ORAL_TABLET | Freq: Four times a day (QID) | ORAL | Status: DC

## 2014-08-19 MED ORDER — SUCRALFATE 1 GM/10ML PO SUSP: 1.0000 g | Freq: Three times a day (TID) | ORAL | Status: DC

## 2014-08-19 MED ORDER — ENOXAPARIN SODIUM 40 MG/0.4ML ~~LOC~~ SOLN: 40.0000 mg | SUBCUTANEOUS | Status: DC

## 2014-08-19 MED ORDER — HEPARIN SOD (PORK) LOCK FLUSH 100 UNIT/ML IV SOLN
500.0000 [IU] | Freq: Once | INTRAVENOUS | Status: DC
  Filled 2014-08-19: qty 5

## 2014-08-19 MED ORDER — ENALAPRIL MALEATE 10 MG PO TABS
10.0000 mg | ORAL_TABLET | Freq: Every day | ORAL | Status: DC
  Administered 2014-08-19: 10 mg via ORAL
  Filled 2014-08-19: qty 1

## 2014-08-19 MED ORDER — ENSURE COMPLETE PO LIQD: 237.0000 mL | Freq: Two times a day (BID) | ORAL | Status: DC

## 2014-08-19 MED ORDER — PROCHLORPERAZINE MALEATE 10 MG PO TABS: 10.0000 mg | ORAL_TABLET | Freq: Four times a day (QID) | ORAL | Status: DC | PRN

## 2014-08-19 MED ORDER — LORAZEPAM 0.5 MG PO TABS: 0.5000 mg | ORAL_TABLET | Freq: Three times a day (TID) | ORAL | Status: DC

## 2014-08-19 MED ORDER — PANTOPRAZOLE SODIUM 40 MG PO TBEC
40.0000 mg | DELAYED_RELEASE_TABLET | Freq: Every day | ORAL | Status: DC
  Administered 2014-08-19: 40 mg via ORAL
  Filled 2014-08-19: qty 1

## 2014-08-19 NOTE — Progress Notes (Signed)
08-19-2014    8:34 AM  Hospital day 5 Post op day 4 Antibiotics: none Chemotherapy: day 1 cycle 1 dose dense carboplatin taxol  Patient seen, no visitors here now. Discussed with RN at bedside  Subjective: Tolerated chemotherapy well yesterday, no nausea since. Able to eat mashed potatoes and chicken last pm. GERD better, tho noticed just slightly this AM. No BM today. PAC functioned well, not uncomfortable. Abdomen mildly sore, does not feel more distended than yesterday (4 liters of ascites at surgery and paracentesis for 1.3 liters on 1-28).  Felt somewhat SOB during night, better by early AM. No cough or chest pain, no wheezing. IVF had been NS for over 24 hours, that changed to D5 at [facility] early last pm. She has been off usual daily 20 mg lasix since BP lower after surgery, also off usual Vasotec 10 mg daily since surgery, with BP higher now. She has been voiding much more overnight, without dysuria. She has not used incentive spirometer since transfer to oncology unit, that located in her room and patient using it now.  Up to BR without difficulty No bleeding, on lovenox prophylactic. No other symptoms of infection, with temp 99.4 this AM  Objective: Vital signs in last 24 hours: Blood pressure 142/90, pulse 79, temperature 99.4 F (37.4 C), temperature source Oral, resp. rate 18, height 5\' 2"  (1.575 m), weight 133 lb 6.4 oz (60.51 kg), SpO2 100 %.   Intake/Output from previous day: 01/29 0701 - 01/30 0700 In: 540 [P.O.:540] Out: -  Intake/Output this shift:  IV now D5 @ KVO  Physical exam: awake, alert, looks comfortable in bed on RA. Changes positions easily. Slight periorbital edema as before. No JVD. Lungs clear other than dullness to percussion and absent BS left lower posteriorly. Heart RRR no gallop. Abdomen full, not tight, ~ as yesterday, minimal BS. Staples in, incision looks good. Feet warm, no cords or tenderness LE, no pitting edema. No swelling now RUE, no erythema or  cord, peripheral IV out. PAC dressed, no surrounding ecchymosis, infusing with no difficulty. Speech fluent and appropriate. Moves all extremities easily.  Lab Results:  Recent Labs  08/17/14 0605  WBC 6.6  HGB 10.7*  HCT 33.5*  PLT 356   BMET  Recent Labs  08/18/14 0605 08/19/14 0627  NA 135 135  K 4.5 4.3  CL 106 105  CO2 22 24  GLUCOSE 147* 130*  BUN 10 12  CREATININE 0.63 0.66  CALCIUM 8.1* 7.7*   UA, C & S ordered now due to slight elevation of temp, recent foley and chemo begun. I will follow up results outpatient.  Studies/Results: Ir Fluoro Guide Cv Line Right  08/17/2014   CLINICAL DATA:  Metastatic serous carcinoma of gynecologic origin. The patient requires a porta cath to begin chemotherapy.  EXAM: IMPLANTED PORT A CATH PLACEMENT WITH ULTRASOUND AND FLUOROSCOPIC GUIDANCE  ANESTHESIA/SEDATION: 2.0 Mg IV Versed; 100 mcg IV Fentanyl  Total Moderate Sedation Time:  30 minutes  Additional Medications: 2 g IV Ancef. As antibiotic prophylaxis, Ancef was ordered pre-procedure and administered intravenously within one hour of incision.  FLUOROSCOPY TIME:  24 seconds.  PROCEDURE: The procedure, risks, benefits, and alternatives were explained to the patient. Questions regarding the procedure were encouraged and answered. The patient understands and consents to the procedure.  The right neck and chest were prepped with chlorhexidine in a sterile fashion, and a sterile drape was applied covering the operative field. Maximum barrier sterile technique with sterile gowns and  gloves were used for the procedure. Local anesthesia was provided with 1% lidocaine. A time-out was performed prior to the procedure.  Ultrasound was used to confirm patency of the right internal jugular vein. After creating a small venotomy incision, a 21 gauge needle was advanced into the right internal jugular vein under direct, real-time ultrasound guidance. Ultrasound image documentation was performed. After  securing guidewire access, an 8 Fr dilator was placed. A J-wire was kinked to measure appropriate catheter length.  A subcutaneous port pocket was then created along the upper chest wall utilizing sharp and blunt dissection. Portable cautery was utilized. The pocket was irrigated with sterile saline.  A single lumen power injectable port was chosen for placement. The 8 Fr catheter was tunneled from the port pocket site to the venotomy incision. The port was placed in the pocket and secured with two Ethilon tacking sutures. External catheter was trimmed to appropriate length based on guidewire measurement.  At the venotomy, an 8 Fr peel-away sheath was placed over a guidewire. The catheter was then placed through the sheath and the sheath removed. Final catheter positioning was confirmed and documented with a fluoroscopic spot image. The port was accessed with a needle and aspirated and flushed with saline. The needle was left in.  The venotomy and port pocket incisions were closed with subcutaneous 3-0 Monocryl and subcuticular 4-0 Vicryl. Dermabond was applied to both incisions.  COMPLICATIONS: None  FINDINGS: After catheter placement, the tip lies at the cavoatrial junction. The catheter aspirates normally and is ready for immediate use.  IMPRESSION: Placement of single lumen port a cath via right internal jugular vein. The catheter tip lies at the cavoatrial junction. A power injectable port a cath was placed and is ready for immediate use.   Electronically Signed   By: Aletta Edouard M.D.   On: 08/17/2014 16:04   Ir US Guide Vasc Access Right  08/17/2014   CLINICAL DATA:  Metastatic serous carcinoma of gynecologic origin. The patient requires a porta cath to begin chemotherapy.  EXAM: IMPLANTED PORT A CATH PLACEMENT WITH ULTRASOUND AND FLUOROSCOPIC GUIDANCE  ANESTHESIA/SEDATION: 2.0 Mg IV Versed; 100 mcg IV Fentanyl  Total Moderate Sedation Time:  30 minutes  Additional Medications: 2 g IV Ancef. As  antibiotic prophylaxis, Ancef was ordered pre-procedure and administered intravenously within one hour of incision.  FLUOROSCOPY TIME:  24 seconds.  PROCEDURE: The procedure, risks, benefits, and alternatives were explained to the patient. Questions regarding the procedure were encouraged and answered. The patient understands and consents to the procedure.  The right neck and chest were prepped with chlorhexidine in a sterile fashion, and a sterile drape was applied covering the operative field. Maximum barrier sterile technique with sterile gowns and gloves were used for the procedure. Local anesthesia was provided with 1% lidocaine. A time-out was performed prior to the procedure.  Ultrasound was used to confirm patency of the right internal jugular vein. After creating a small venotomy incision, a 21 gauge needle was advanced into the right internal jugular vein under direct, real-time ultrasound guidance. Ultrasound image documentation was performed. After securing guidewire access, an 8 Fr dilator was placed. A J-wire was kinked to measure appropriate catheter length.  A subcutaneous port pocket was then created along the upper chest wall utilizing sharp and blunt dissection. Portable cautery was utilized. The pocket was irrigated with sterile saline.  A single lumen power injectable port was chosen for placement. The 8 Fr catheter was tunneled from the port pocket site  to the venotomy incision. The port was placed in the pocket and secured with two Ethilon tacking sutures. External catheter was trimmed to appropriate length based on guidewire measurement.  At the venotomy, an 8 Fr peel-away sheath was placed over a guidewire. The catheter was then placed through the sheath and the sheath removed. Final catheter positioning was confirmed and documented with a fluoroscopic spot image. The port was accessed with a needle and aspirated and flushed with saline. The needle was left in.  The venotomy and port pocket  incisions were closed with subcutaneous 3-0 Monocryl and subcuticular 4-0 Vicryl. Dermabond was applied to both incisions.  COMPLICATIONS: None  FINDINGS: After catheter placement, the tip lies at the cavoatrial junction. The catheter aspirates normally and is ready for immediate use.  IMPRESSION: Placement of single lumen port a cath via right internal jugular vein. The catheter tip lies at the cavoatrial junction. A power injectable port a cath was placed and is ready for immediate use.   Electronically Signed   By: Aletta Edouard M.D.   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  EXAM: ULTRASOUND-GUIDED THERAPEUTIC PARACENTESIS  COMPARISON:  None.  MEDICATIONS: None.  COMPLICATIONS: None immediate  TECHNIQUE: Informed written consent was obtained from the patient after a discussion of the risks, benefits and alternatives to treatment. A timeout was performed prior to the initiation of the procedure.  Initial ultrasound scanning demonstrates a small amount of ascites within the right lower abdominal quadrant. The right lower abdomen was prepped and draped in the usual sterile fashion. 1% lidocaine was used for local anesthesia. Under direct ultrasound guidance, a 19 gauge, 7-cm, Yueh catheter was introduced. An ultrasound image was saved for documentation purposed. The paracentesis was performed. The catheter was removed and a dressing was applied. The patient tolerated the procedure well without immediate post procedural complication.  FINDINGS: A total of approximately 1.3 liters of slightly turbid, amber fluid was removed.  IMPRESSION: Successful ultrasound-guided paracentesis yielding 1.3 liters of peritoneal fluid.  Read by: Rowe Robert, PA-C   Electronically Signed   By: Aletta Edouard M.D.   On: 08/17/2014 16:39   CXR ordered now, with SOB overnight,  to follow up pleural effusions, as we can set up outpatient thoracentesis (or paracentesis) coordinating with follow up at [facility] next week if needed. She will need cytology of pleural fluid if thoracentesis done.  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 I will see her back at [facility] on 08-24-14, with day 8 taxol that day. I have given her contact information for [facility] and she knows that she can call at any time if concerns. My RN will follow up with her by phone early next week. We can arrange outpatient paracentesis or thoracentesis if needed, and hopefully chemo will begin to improve these problems soon. Gyn oncology will follow also. 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 Prior chemo records requested. Up to date on mammograms 4.SOB during night, symptoms better now. O2 sat 100%.  Resume incentive spirometry, resume lasix. CXR requested, we can follow up pleural effusions outpatient if breathing continues stable now. 5.severe malnutrition in context of chronic illness: needs Ensure as ordered. Nutritionist involved. Diet advanced. 6.constipation resolved: tumor in proximity to rectum. 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 BP higher with IVF since yesterday, NS changed to [facility] D5 yesterday evening. Will resume Vasotec now and give lasix 10 mg this AM.  8.GERD: better with one dose carafate given thus far.  Continue carafate and protonix at DC (protonix changed to po now) 9.post parathyroidectomy 2005 10.Hyponatremia corrected.    DC meds should include zofran, carafate, protonix  Please page me if needed   [place]

## 2014-08-19 NOTE — Progress Notes (Signed)
Patient discharged to home with family, discharge instructions reviewed with patient who verbalized understanding. Lovenox teaching completed at time of injection, patient demonstrated and gave her own injection. New RX given to patient.

## 2014-08-19 NOTE — Progress Notes (Signed)
4 Days Post-Op Procedure(s) (LRB): EXPLORATORY LAPAROTOMY (N/A) biopsy of omentum (N/A) S/p 1st cycle of carbplatin and paclitaxel 08/18/14  Subjective: Patient reports tolerating regular diet, passing flatus and stool. Feeling good today after chemotherapy. Pain well controlled.  Ambulating without assistance. Episode of SOB overnight. Now resolved. CXR pending.  Objective: Vital signs in last 24 hours: Temp:  [97.6 F (36.4 C)-99.7 F (37.6 C)] 99.4 F (37.4 C) (01/30 0645) Pulse Rate:  [79-93] 79 (01/30 0645) Resp:  [18-20] 18 (01/30 0645) BP: (137-149)/(80-100) 142/90 mmHg (01/30 0645) SpO2:  [95 %-100 %] 100 % (01/30 0645) Weight:  [133 lb 6.4 oz (60.51 kg)] 133 lb 6.4 oz (60.51 kg) (01/30 0645) Last BM Date: 08/18/14  Intake/Output from previous day: 01/29 0701 - 01/30 0700 In: 540 [P.O.:540] Out: -   Physical Examination: General: alert, cooperative and no distress Resp: clear but mild diminished in the bases, no crackles or rales noted Cardio: regular rate and rhythm, S1, S2 normal, no murmur, click, rub or gallop GI: incision: midline incision with staples without erythema or drainage and abdomen moderately distended and firm, active bowel sounds, non-tender  Extremities: extremities normal, atraumatic, no cyanosis or edema  Labs:   BUN/Cr/glu/ALT/AST/amyl/lip:  12/0.66/--/--/--/--/-- (01/30 0627)  Assessment: 64 y.o. s/p Procedure(s): EXPLORATORY LAPAROTOMY, biopsy of omentum: stable Pain:  Pain is well-controlled on PRN medications.  Heme: Hgb 10.7 and Hct 33.5 yesterday.  Stable post-operatively.  CV: stable  GI:  Tolerating po: Yes, liquids. Will advance to regular diet.  GU: Improved urine output.  Normal creatinine. UA ordered per Dr Marko Plume  FEN: d.c. IVF's after chemotherapy  ONC: s/p 1st cycle carb/tax yesterday. Patient tolerated well. Plan for cycle 2 in 3 weeks (as outpatient)  PULM: SOB episode overnight with no focal findings on exam  today.  Prophylaxis: pharmacologic prophylaxis (with any of the following: enoxaparin (Lovenox) 40mg  SQ 2 hours prior to surgery then every day) and intermittent pneumatic compression boots.  Plan: Appreciate Medical Oncology Consult: Dr. Marko Plume General diet. Encourage ambulation, IS use, deep breathing, and coughing Plan for discharge to home today if CXR unremarkable.   LOS: 4 days    [name] 08/19/2014, 9:57 AM

## 2014-08-19 NOTE — Discharge Summary (Signed)
Physician Discharge Summary  Patient ID: Kathleen Bond MRN: [id number] DOB/AGE: [birth year] 64 y.o.  Admit date: 08/15/2014 Discharge date: 08/19/2014  Admission Diagnoses: Ovarian cancer  Discharge Diagnoses:  Principal Problem:   Ovarian cancer Active Problems:   Ascites, malignant   Pelvic mass in female   Protein-calorie malnutrition, severe   Discharged Condition: good  Hospital Course: Patient was admitted on Tuesday 08/15/14 for exploratory laparotomy and omental biopsy of unresectable, stage IV ovarian serous carcinoma. She recovered well from a postop standpoint and received placement of an IV portacath. She was initially oliguric with mild acute kidney injury, but became adequately resuscitated of IVF's. At the time of discharge her creatinine is normal. She was started on cycle 1 of IV carboplatin and paclitaxel prescribed by Dr Marko Plume on 08/18/14. She tolerated this well and was meeting discharge criteria on Saturday 08/19/14.  Consults: hematology/oncology  Significant Diagnostic Studies: chest xray, labs  Treatments: IV hydration, surgery: ex lap, omental biopsy and chemotherapy  Discharge Exam: Blood pressure 142/90, pulse 79, temperature 99.4 F (37.4 C), temperature source Oral, resp. rate 18, height 5\' 2"  (1.575 m), weight 133 lb 6.4 oz (60.51 kg), SpO2 100 %. General appearance: alert and cooperative Resp: clear to auscultation bilaterally Cardio: regular rate and rhythm, S1, S2 normal, no murmur, click, rub or gallop Pelvic: vagina normal without discharge Extremities: extremities normal, atraumatic, no cyanosis or edema  Disposition:   Discharge Instructions    TREATMENT CONDITIONS    Complete by:  As directed   Day 1 cycle 1 to be given in hospital at [facility]  Patient should have CBC & CMP within 7 days prior to chemotherapy administration. NOTIFY MD IF: ANC < 1500, Hemoglobin < 8, PLT < 100,000,  Total Bili > 1.5, Creatinine > 1.5, ALT & AST > 80 or if  patient has unstable vital signs: Temperature > 38.5, SBP > 180 or < 90, RR > 30 or HR > 100.            Medication List    TAKE these medications        docusate sodium 100 MG capsule  Commonly known as:  COLACE  Take 1 capsule (100 mg total) by mouth 2 (two) times daily.     enalapril 10 MG tablet  Commonly known as:  VASOTEC  Take 10 mg by mouth every morning.     enoxaparin 40 MG/0.4ML injection  Commonly known as:  LOVENOX  Inject 0.4 mLs (40 mg total) into the skin daily.     feeding supplement (ENSURE COMPLETE) Liqd  Take 237 mLs by mouth 2 (two) times daily between meals.     furosemide 20 MG tablet  Commonly known as:  LASIX  Take 20 mg by mouth at bedtime.     levothyroxine 150 MCG tablet  Commonly known as:  SYNTHROID, LEVOTHROID  Take 150 mcg by mouth daily before breakfast.     ondansetron 8 MG tablet  Commonly known as:  ZOFRAN  Take 1 tablet (8 mg total) by mouth every 8 (eight) hours as needed for nausea or vomiting.     sucralfate 1 GM/10ML suspension  Commonly known as:  CARAFATE  Take 10 mLs (1 g total) by mouth 4 (four) times daily -  with meals and at bedtime.     traMADol 50 MG tablet  Commonly known as:  ULTRAM  Take 2 tablets (100 mg total) by mouth every 6 (six) hours.     Vitamin D (  Ergocalciferol) 50000 UNITS Caps capsule  Commonly known as:  DRISDOL  Take 50,000 Units by mouth every 7 (seven) days.           Follow-up Information    Follow up with [name], MD. Call on 08/24/2014.   Specialty:  Obstetrics and Gynecology   Contact information:   [place]  [id number] [phone]       Signed: [name] 08/19/2014, 10:19 AM

## 2014-08-19 NOTE — Discharge Instructions (Signed)
08/19/2014  Return to work: 6 weeks  Activity: 1. Be up and out of the bed during the day.  Take a nap if needed.  You may walk up steps but be careful and use the hand rail.  Stair climbing will tire you more than you think, you may need to stop part way and rest.   2. No lifting or straining for 6 weeks.  3. No driving for 1 week.  Do Not drive if you are taking narcotic pain medicine.  4. Shower daily.  Use soap and water on your incision and pat dry; don't rub.   5. No sexual activity and nothing in the vagina for 1 week.  Diet: 1. Low sodium Heart Healthy Diet is recommended.  2. It is safe to use a laxative if you have difficulty moving your bowels.   Wound Care: 1. Keep clean and dry.  Shower daily.  Reasons to call the Doctor:   Fever - Oral temperature greater than 100.4 degrees Fahrenheit  Foul-smelling vaginal discharge  Difficulty urinating  Nausea and vomiting  Increased pain at the site of the incision that is unrelieved with pain medicine.  Difficulty breathing with or without chest pain  New calf pain especially if only on one side  Sudden, continuing increased vaginal bleeding with or without clots.   Follow-up: 1.  See Dr. Denman George Thursday 4th February for staple removal.  Contacts: For questions or concerns you should contact:  Dr. Everitt Amber at [phone]

## 2014-08-20 ENCOUNTER — Other Ambulatory Visit: Payer: Self-pay | Admitting: Oncology

## 2014-08-20 LAB — URINE CULTURE: Colony Count: 1000

## 2014-08-21 ENCOUNTER — Telehealth: Payer: Self-pay | Admitting: *Deleted

## 2014-08-21 DIAGNOSIS — C569 Malignant neoplasm of unspecified ovary: Secondary | ICD-10-CM

## 2014-08-21 MED ORDER — DEXAMETHASONE 4 MG PO TABS
ORAL_TABLET | ORAL | Status: DC
Start: 2014-08-21 — End: 2014-09-07

## 2014-08-21 MED ORDER — LIDOCAINE-PRILOCAINE 2.5-2.5 % EX CREA: TOPICAL_CREAM | CUTANEOUS | Status: DC

## 2014-08-21 NOTE — Telephone Encounter (Signed)
Called to followup with patient as noted below by Dr. Marko Plume after recent discharge from the hospital on 08/19/14. Per pt, she threw up "all day yesterday." When asked to clarify how many times, she states at least 10 times throughout the day. She states she has zofran and compazine tablets at home. She states she only took each one time yesterday and hasn't taken any nausea medications today yet.   Patient denies any SOB or abdominal tightness/discomfort. She said her bowels are moving fine - had a BM this morning. Encouraged patient to continue taking colace to avoid constipation - patient agreeable to this.  Told patient to go ahead and take a zofran tablet when we get off the phone and to try to sip on some water and see if it stays down. Told patient I will call her back in about an hour to see how she is doing and to review pre-meds for next chemo.

## 2014-08-21 NOTE — Telephone Encounter (Signed)
-----   Message from Gordy Levan, MD sent at 08/20/2014  8:55 PM EST ----- Very nice lady with new gyn cancer, DC home Sat 1-30 after first carbo/ weekly taxol given 1-29 in hospital. RN please check on her by phone.  Only antiemetic was zofran at DC. Should be on carafate + protonix for GERD, probably needs to continue stool softener/ laxative. Malignant pleural effusions and ascites. If more SOB or uncomfortable in abdomen, please set up thoracentesis or paracentesis by IR, also 2-4 or sooner if needed.   She had chemo ~ 11 years ago by Dr Sonny Dandy for breast, had PAC then also.  She will have second weekly taxol when she sees me on 2-4. Please send in script for decadron 4 mg  Five tabs = 20 mg with food 12 hrs prior to treatment #10 for 2 treatments, and let patient know about this. Also send in EMLA script.  thanks

## 2014-08-21 NOTE — Telephone Encounter (Signed)
1330 - Called patient back. She states she has taken a zofran tablet and now is sipping on some Gatorade. Denies any nausea currently. Encouraged patient to try eating something bland such as some toast or crackers and see if it stays down. If so, encouraged to increase intake slowly over the next day or two. Patient agreeable to take compazine this afternoon and then zofran again this evening to avoid nausea or vomiting.  Told patient that prescription for emla cream and decadron has been sent to her pharmacy. Patient states that her son can pick it up tonight or tomorrow once he is off work. Told patient that RN will call her back to review medications on Tuesday or Wednesday. Also confirmed that patient is aware of what time to arrive at [facility] on Thursday, 08/24/14, for appts. Pt states she wrote down that her first appt is at [employer] patient to please call [place] anytime in the next few days before her appt on Thursday with any questions or concerns. Patient agreeable to this.

## 2014-08-22 ENCOUNTER — Telehealth: Payer: Self-pay

## 2014-08-22 NOTE — Telephone Encounter (Signed)
Ms. Mackel is doing better today. No n/vsince yesterday.  She is using compazine and zofran. For nausea.   She only has taken in ~30 oz of fluid yesterday.  Told Ms. Zigmund Daniel that she needs to get in 8 oz of fluid in at least every 2 hours while awake.  Reviewed taking in jello, broth, popcicle, and ice cream as fluid also. The carafate is helping prevent the acid reflux and protonix is not needed at this time.  The Ativan prescription was never given to patient so it will be removed from the medication list. Reviewed taking the decadron at 1030 pm with food on 08-23-14 which is 12 hrs prior to her treatment.  Teach Back method used.   Reviewed the application of the EMLA cream to [facility] site prior to office visit 08-24-14.  Ms. Rodena Piety verbalized understanding.

## 2014-08-23 ENCOUNTER — Telehealth: Payer: Self-pay | Admitting: *Deleted

## 2014-08-23 ENCOUNTER — Other Ambulatory Visit: Payer: Self-pay | Admitting: Oncology

## 2014-08-23 DIAGNOSIS — C569 Malignant neoplasm of unspecified ovary: Secondary | ICD-10-CM

## 2014-08-23 DIAGNOSIS — E43 Unspecified severe protein-calorie malnutrition: Secondary | ICD-10-CM

## 2014-08-23 NOTE — Telephone Encounter (Signed)
Per staff message and POF I have scheduled appts. Advised scheduler of appts. JMW  

## 2014-08-23 NOTE — Telephone Encounter (Signed)
Received medical Records via certified mail from [facility].  Records placed on Dr. Mariana Kaufman Desk for review.

## 2014-08-24 ENCOUNTER — Ambulatory Visit: Payer: 59 | Admitting: Oncology

## 2014-08-24 ENCOUNTER — Encounter: Payer: Self-pay | Admitting: Oncology

## 2014-08-24 ENCOUNTER — Telehealth: Payer: Self-pay | Admitting: Oncology

## 2014-08-24 ENCOUNTER — Other Ambulatory Visit (HOSPITAL_BASED_OUTPATIENT_CLINIC_OR_DEPARTMENT_OTHER): Payer: 59

## 2014-08-24 ENCOUNTER — Other Ambulatory Visit: Payer: 59

## 2014-08-24 ENCOUNTER — Ambulatory Visit (HOSPITAL_BASED_OUTPATIENT_CLINIC_OR_DEPARTMENT_OTHER): Payer: 59

## 2014-08-24 ENCOUNTER — Telehealth: Payer: Self-pay | Admitting: *Deleted

## 2014-08-24 ENCOUNTER — Ambulatory Visit (HOSPITAL_BASED_OUTPATIENT_CLINIC_OR_DEPARTMENT_OTHER): Payer: 59 | Admitting: Oncology

## 2014-08-24 ENCOUNTER — Ambulatory Visit: Payer: 59

## 2014-08-24 VITALS — BP 137/89 | HR 106 | Temp 98.2°F | Resp 18 | Ht 62.0 in | Wt 118.6 lb

## 2014-08-24 DIAGNOSIS — K219 Gastro-esophageal reflux disease without esophagitis: Secondary | ICD-10-CM

## 2014-08-24 DIAGNOSIS — R180 Malignant ascites: Secondary | ICD-10-CM

## 2014-08-24 DIAGNOSIS — Z5111 Encounter for antineoplastic chemotherapy: Secondary | ICD-10-CM

## 2014-08-24 DIAGNOSIS — C569 Malignant neoplasm of unspecified ovary: Secondary | ICD-10-CM

## 2014-08-24 DIAGNOSIS — Z853 Personal history of malignant neoplasm of breast: Secondary | ICD-10-CM

## 2014-08-24 DIAGNOSIS — E43 Unspecified severe protein-calorie malnutrition: Secondary | ICD-10-CM

## 2014-08-24 DIAGNOSIS — B379 Candidiasis, unspecified: Secondary | ICD-10-CM

## 2014-08-24 DIAGNOSIS — C801 Malignant (primary) neoplasm, unspecified: Secondary | ICD-10-CM

## 2014-08-24 DIAGNOSIS — C50912 Malignant neoplasm of unspecified site of left female breast: Secondary | ICD-10-CM

## 2014-08-24 DIAGNOSIS — J90 Pleural effusion, not elsewhere classified: Secondary | ICD-10-CM

## 2014-08-24 DIAGNOSIS — Z95828 Presence of other vascular implants and grafts: Secondary | ICD-10-CM

## 2014-08-24 LAB — CBC WITH DIFFERENTIAL/PLATELET
BASO%: 0 % (ref 0.0–2.0)
Basophils Absolute: 0 10*3/uL (ref 0.0–0.1)
EOS%: 0 % (ref 0.0–7.0)
Eosinophils Absolute: 0 10*3/uL (ref 0.0–0.5)
HCT: 33.1 % — ABNORMAL LOW (ref 34.8–46.6)
HGB: 10.9 g/dL — ABNORMAL LOW (ref 11.6–15.9)
LYMPH%: 16.6 % (ref 14.0–49.7)
MCH: 27.5 pg (ref 25.1–34.0)
MCHC: 32.9 g/dL (ref 31.5–36.0)
MCV: 83.6 fL (ref 79.5–101.0)
MONO#: 0.1 10*3/uL (ref 0.1–0.9)
MONO%: 2.1 % (ref 0.0–14.0)
NEUT%: 81.3 % — ABNORMAL HIGH (ref 38.4–76.8)
NEUTROS ABS: 3 10*3/uL (ref 1.5–6.5)
Platelets: 382 10*3/uL (ref 145–400)
RBC: 3.96 10*6/uL (ref 3.70–5.45)
RDW: 13.7 % (ref 11.2–14.5)
WBC: 3.7 10*3/uL — ABNORMAL LOW (ref 3.9–10.3)
lymph#: 0.6 10*3/uL — ABNORMAL LOW (ref 0.9–3.3)

## 2014-08-24 LAB — COMPREHENSIVE METABOLIC PANEL (CC13)
ALK PHOS: 83 U/L (ref 40–150)
ALT: 54 U/L (ref 0–55)
AST: 40 U/L — AB (ref 5–34)
Albumin: 2.8 g/dL — ABNORMAL LOW (ref 3.5–5.0)
Anion Gap: 10 mEq/L (ref 3–11)
BUN: 10.5 mg/dL (ref 7.0–26.0)
CALCIUM: 8.8 mg/dL (ref 8.4–10.4)
CHLORIDE: 102 meq/L (ref 98–109)
CO2: 25 mEq/L (ref 22–29)
Creatinine: 0.7 mg/dL (ref 0.6–1.1)
Glucose: 156 mg/dl — ABNORMAL HIGH (ref 70–140)
Potassium: 4 mEq/L (ref 3.5–5.1)
SODIUM: 137 meq/L (ref 136–145)
TOTAL PROTEIN: 6.7 g/dL (ref 6.4–8.3)
Total Bilirubin: 0.31 mg/dL (ref 0.20–1.20)

## 2014-08-24 MED ORDER — FLUCONAZOLE 100 MG PO TABS
100.0000 mg | ORAL_TABLET | Freq: Every day | ORAL | Status: DC
Start: 2014-08-24 — End: 2014-09-07

## 2014-08-24 MED ORDER — SODIUM CHLORIDE 0.9 % IJ SOLN
10.0000 mL | INTRAMUSCULAR | Status: DC | PRN
  Administered 2014-08-24: 10 mL via INTRAVENOUS
  Filled 2014-08-24: qty 10

## 2014-08-24 MED ORDER — DIPHENHYDRAMINE HCL 50 MG/ML IJ SOLN
INTRAMUSCULAR | Status: AC
  Filled 2014-08-24: qty 1

## 2014-08-24 MED ORDER — PANTOPRAZOLE SODIUM 40 MG PO TBEC
40.0000 mg | DELAYED_RELEASE_TABLET | Freq: Every day | ORAL | Status: DC
Start: 2014-08-24 — End: 2015-05-21

## 2014-08-24 MED ORDER — SODIUM CHLORIDE 0.9 % IV SOLN
Freq: Once | INTRAVENOUS | Status: AC
  Administered 2014-08-24: 11:00:00 via INTRAVENOUS

## 2014-08-24 MED ORDER — DEXAMETHASONE SODIUM PHOSPHATE 20 MG/5ML IJ SOLN
20.0000 mg | Freq: Once | INTRAMUSCULAR | Status: AC
  Administered 2014-08-24: 20 mg via INTRAVENOUS

## 2014-08-24 MED ORDER — DIPHENHYDRAMINE HCL 50 MG/ML IJ SOLN
50.0000 mg | Freq: Once | INTRAMUSCULAR | Status: AC
  Administered 2014-08-24: 50 mg via INTRAVENOUS

## 2014-08-24 MED ORDER — DEXAMETHASONE SODIUM PHOSPHATE 20 MG/5ML IJ SOLN
INTRAMUSCULAR | Status: AC
  Filled 2014-08-24: qty 5

## 2014-08-24 MED ORDER — DEXTROSE 5 % IV SOLN
80.0000 mg/m2 | Freq: Once | INTRAVENOUS | Status: AC
  Administered 2014-08-24: 126 mg via INTRAVENOUS
  Filled 2014-08-24: qty 21

## 2014-08-24 MED ORDER — ONDANSETRON 8 MG/NS 50 ML IVPB
INTRAVENOUS | Status: AC
Start: 2014-08-24 — End: 2014-08-24
  Filled 2014-08-24: qty 8

## 2014-08-24 MED ORDER — FAMOTIDINE IN NACL 20-0.9 MG/50ML-% IV SOLN
INTRAVENOUS | Status: AC
  Filled 2014-08-24: qty 50

## 2014-08-24 MED ORDER — ONDANSETRON 8 MG/50ML IVPB (CHCC)
8.0000 mg | Freq: Once | INTRAVENOUS | Status: AC
  Administered 2014-08-24: 8 mg via INTRAVENOUS

## 2014-08-24 MED ORDER — HEPARIN SOD (PORK) LOCK FLUSH 100 UNIT/ML IV SOLN
500.0000 [IU] | Freq: Once | INTRAVENOUS | Status: AC
  Administered 2014-08-24: 500 [IU] via INTRAVENOUS
  Filled 2014-08-24: qty 5

## 2014-08-24 MED ORDER — FAMOTIDINE IN NACL 20-0.9 MG/50ML-% IV SOLN
20.0000 mg | Freq: Once | INTRAVENOUS | Status: AC
  Administered 2014-08-24: 20 mg via INTRAVENOUS

## 2014-08-24 NOTE — Telephone Encounter (Signed)
per pof to sch pt appt-sent MW emailt o sch pt appt-pt to get updated copy b4 leaving

## 2014-08-24 NOTE — Progress Notes (Signed)
Checked in new pt with no financial concerns prior to seeing the dr.  Informed pt that Raquel already obtained auth from her insurance company for chemo and suggested that she call Raquel if she needs any assistance with her out of pocket for chemo.  She has Raquel's card.

## 2014-08-24 NOTE — Patient Instructions (Signed)
We will send prescriptions for 2 medicines to your pharmacy, to start today:  Diflucan (fluconazole) 100 mg    One daily for a week,  for thrush in your mouth/ may also be in your throat.    Protonix 40 mg   Generic or substitute ok, for reflux. One daily.  Take the Protonix and carafate (sucralfate, the pink one) every day even if no nausea or no reflux   We will give you IV fluids/ IV nausea medicine and the neupogen/ granix shot for white blood cells on Saturday 2-6.  Try to sip Ensure thru day in addition to doing the best you can with other fluids and food.  Dietician will meet with you at the [facility]. Do not take lasix if you cannot keep down fluids well.   Call if you cannot keep down fluids or if other problems   [phone]

## 2014-08-24 NOTE — Telephone Encounter (Signed)
Per staff message and POF I have scheduled appts. Advised scheduler of appts. JMW  

## 2014-08-24 NOTE — Progress Notes (Signed)
OFFICE PROGRESS NOTE   August 24, 2014   Physicians: (K.Karb, M.Manning), E.Rossi, Cleda Mccreedy (PCP), [name]  300 pages received this AM from [place] via [facility] HIM from breast cancer diagnosis/ treatment/ follow up. Records reviewed in detail by this MD, summarized below. Pertinent information from this, including pathology, chemo flow sheets, RT flow sheets and summary notes will be scanned into this EMR.  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 Course to date has been complicated by large volume malignant ascites; debulking was not possible at exploratory laparatomy by Dr Denman George on 08-15-14. Day 1 cycle 1 chemotherapy was given in hospital on 08-18-14.  Paracentesis 07-27-14 for 3.4 liters, at surgery 4 liters, 08-17-14 for 1.3 liters. Right thoracentesis 350 cc 08-11-14.  Patient did well with chemotherapy in hospital and was discharged home on 08-19-14. She had nausea and vomiting on 08-20-14, used compazine x1 and zofran x1 without improvement, very little po intake that day. RN assisted by phone on 08-21-14, symptoms improved and no nausea today. She has been able to keep down won ton soup, powerade, popsicles, then chicken last pm and some Ensure today. She has some burning in esophagus, exacerbated by salty liquids. Bowels moved x2 early this AM. She does not feel uncomfortable from ascites now. She denies SOB, cough or chest pain. The new PAC is just a little sore, EMLA used for treatment today. She has been in bed more than half of day, does walk in house some.  She has felt the best today that she has since hospital, likely in part with premedication decadron last pm for taxol today.  PAC placed by IR 08-17-14 No flu vaccine - not done today due to chemo  today. No genetics testing  ONCOLOGIC HISTORY Gyn cancer: Patient became symptomatic with abdominal swelling and constipation in late Nov 2015, seen and treated initially at [facility] ED and by PCP Dr Cleda Mccreedy for what was thought to be ulcer. Symptoms continued, with early satiety causing poor po intake; she was seen again at [facility] ED in early Jan 2016, with CT reportedly consistent with stage IIIC or IV gyn cancer, including omental caking, large pelvic mass, para-aortic adenopathy, ascites, pleural effusions and right subpleural nodule. She was seen in consultation by Dr Denman George on 07-25-14, with concern from her exam and scan for rectal involvement. 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 She had paracentesis of 3.4 liters ascites on 07-27-2014, with cytology ([id number]) showing serous carcinoma consistent with gyn primary. She had right thoracentesis for 350 cc on 08-11-14, this requested by anesthesia prior to surgery and apparently not sent for cytology. Patient was more comfortable after paracentesis, but could not tell any difference in breathing after thoracentesis (denies SOB prior to that procedure). CA 125 on 08-02-14 was 1162. She was taken to exploratory laparotomy by Dr Denman George on 08-15-14, with 4 liters of ascites removed and biopsy of omentum obtained, with additional debulking not attempted due to surgical findings of entire peritoneal surface replaced by tumor plaque, small intestine coated with tumor and mesentery tethered with tumor nodules, ileum adherent to pelvic mass, dense plaque of tumor on right diaphragm and 10 cm omental cake in LUQ. Pathology of omental biopsy She needed another paracentesis  by IR for 1.3 liters ascites on 08-17-14, and had PAC placed by IR also 08-17-14. Day 1 cycle 1 dose dense carboplatin taxol was given in hospital on 08-18-14.   Breast cancer: Left breast biopsy 11-22-2003 ([id number])  invasive carcinoma. Left breast wide excision with sentinel nodes and axillary contents by Dr Benson Norway 01-03-2004 [phone]) poorly differentiated invasive ductal carcinoma 3.0 cm with closest margin 1.7 cm deep, 6 sentinel nodes + 3 additional nodes all negative, ER 0, PR 0, HER 2  3+,  Ki67 of 19%. . She received adjuvant chemotherapy by Dr Sonny Dandy with 5FU, epirubicin, cytoxan x 4 cycles from 02-13-2004 thru 04-16-2004, followed by taxol 80 mg/m2 weekly x 11 from 05-07-2004 thru 09-04-2004, and herceptin 05-21-2004 thru 02-25-2005. Antiemetics were Kytril and decadron. She had PAC for the chemotherapy. She had radiation by Dr Tyler Pita in [place]. Last note from Dr Sonny Dandy in this information was 04-2008, tho year follow up was recommended then.  Most recent mammograms were in [place] 02-2014, reportedly not remarkable.   Review of systems as above, also: No fever or symptoms of infection. LE swelling resolved. No oral mucositis symptoms.No bleeding. No chest pain. No orthostatic symptoms. Remainder of 10 point Review of Systems negative.  Objective:  Vital signs in last 24 hours:  BP 137/89 mmHg  Pulse 106  Temp(Src) 98.2 F (36.8 C) (Oral)  Resp 18  Ht 5' 2"  (1.575 m)  Wt 118 lb 9.6 oz (53.797 kg)  BMI 21.69 kg/m2 Weight on different scales with IVF in hospital recorded as 133 lbs on 08-19-14.  Alert, oriented and appropriate. Ambulatory in exam room, but agreed to use [facility] for rest of office.  No alopecia  HEENT:PERRL, sclerae not icteric. Mild periorbital edema. Oral mucosa moist with new thrush posterior buccal mucosa and tongue, posterior pharynx clear.  Neck supple. No JVD.  Lymphatics:no cervical,supraclavicular, axillary adenopathy Resp: Dullness to percussion and decreased BS lower 1/4 left posterior and in base on right, otherwise clear to A and P Cardio: regular rate and rhythm. No gallop. GI: soft, nontender, mildly distended especially in upper abdomen but not tight, no  appreciable mass or organomegaly. Some bowel sounds. Surgical incision with staples in, no tenderness or erythema, closed. Musculoskeletal/ Extremities: without pitting edema, cords, tenderness Neuro: no peripheral neuropathy. Otherwise nonfocal Skin without rash, ecchymosis, petechiae Breasts: left with well healed lumpectomy scar, no dominant mass, skin or nipple findings. Right without dominant mass, skin or nipple findings of concern. Axillae benign. Portacath- Resolving ecchymosis, without erythema or unexpected tenderness, position appears good.  Staples removed by gyn onc NP after MD visit  Lab Results:  Results for orders placed or performed in visit on 08/24/14  CBC with Differential  Result Value Ref Range   WBC 3.7 (L) 3.9 - 10.3 10e3/uL   NEUT# 3.0 1.5 - 6.5 10e3/uL   HGB 10.9 (L) 11.6 - 15.9 g/dL   HCT 33.1 (L) 34.8 - 46.6 %   Platelets 382 145 - 400 10e3/uL   MCV 83.6 79.5 - 101.0 fL   MCH 27.5 25.1 - 34.0 pg   MCHC 32.9 31.5 - 36.0 g/dL   RBC 3.96 3.70 - 5.45 10e6/uL   RDW 13.7 11.2 - 14.5 %   lymph# 0.6 (L) 0.9 - 3.3 10e3/uL   MONO# 0.1 0.1 - 0.9 10e3/uL   Eosinophils Absolute 0.0 0.0 - 0.5 10e3/uL   Basophils Absolute 0.0 0.0 - 0.1 10e3/uL   NEUT% 81.3 (H) 38.4 - 76.8 %  LYMPH% 16.6 14.0 - 49.7 %   MONO% 2.1 0.0 - 14.0 %   EOS% 0.0 0.0 - 7.0 %   BASO% 0.0 0.0 - 2.0 %  Comprehensive metabolic panel (Cmet) - [facility]  Result Value Ref Range   Sodium 137 136 - 145 mEq/L   Potassium 4.0 3.5 - 5.1 mEq/L   Chloride 102 98 - 109 mEq/L   CO2 25 22 - 29 mEq/L   Glucose 156 (H) 70 - 140 mg/dl   BUN 10.5 7.0 - 26.0 mg/dL   Creatinine 0.7 0.6 - 1.1 mg/dL   Total Bilirubin 0.31 0.20 - 1.20 mg/dL   Alkaline Phosphatase 83 40 - 150 U/L   AST 40 (H) 5 - 34 U/L   ALT 54 0 - 55 U/L   Total Protein 6.7 6.4 - 8.3 g/dL   Albumin 2.8 (L) 3.5 - 5.0 g/dL   Calcium 8.8 8.4 - 10.4 mg/dL   Anion Gap 10 3 - 11 mEq/L   EGFR >90 >90 ml/min/1.73 m2    Urine culture negative from  08-19-2014 Studies/Results:  Surgical pathology and recent imaging as noted.  Medications: I have reviewed the patient's current medications. She has used tramadol only x1 for abdominal discomfort since DC home. Prescriptions sent for diflucan 100 mg daily x 7 and protonix 40 mg daily (in addition to carafate), to begin today. Hold lasix if unable to take po fluids well.  Continue lovenox to complete full prescription by gyn onc. Per financial staff, Neupogen/ granix approved. Will begin this at 300 mg on 08-26-14, likely using once weekly after chemo treatments depending on counts.  Discussed addition of gCSF.  DISCUSSION: Interval history carefully reviewed with patient. Add diflucan for oral and possibly esophageal candida. Give IVF on 08-26-14 if not taking po fluids very well that day; give neupogen/ granix 300 mg also on 08-26-14. Nutrition discussed; she is to meet with [facility] dietician also.  Written instructions as follows:  We will send prescriptions for 2 medicines to your pharmacy, to start today: Diflucan (fluconazole) 100 mg    One daily for a week,  for thrush in your mouth/ may also be in your throat.   Protonix 40 mg   Generic or substitute ok, for reflux. One daily. Take the Protonix and carafate (sucralfate, the pink one) every day even if no nausea or no reflux We will give you IV fluids/ IV nausea medicine and the neupogen/ granix shot for white blood cells on Saturday 2-6. Try to sip Ensure thru day in addition to doing the best you can with other fluids and food.  Dietician will meet with you at the [facility]. Do not take lasix if you cannot keep down fluids well. Call if you cannot keep down fluids or if other problems   [phone]  Assessment/Plan:  1.IIIC vs IV high grade serous gyn carcinoma with large volume malignant ascites and pleural effusions: DIsease too extensive at exploratory laparotomy 08-15-14 for debulking, so chemotherapy begun urgently in hospital 08-18-14 as  initial intervention. Still obviously symptomatic but no acute problems today. Proceed with second week taxol today, with IVF + IV decadron/zofran + neupogen 300 mcg on 08-26-14. I will see her with day 15 treatment on 08-31-14. Genetics testing should be considered. Gyn onc following. 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 Prior records received. Up to date on mammograms. Genetics testing should be considered. 4.Pleural effusions and ascites not more symptomatic today. Follow closely. 5.severe malnutrition in context of chronic illness: as above, [place] nutritionist aware and will assist. 6.constipation resolved: tumor in proximity to rectum. Needs stool softeners and prn laxatives ongoing.   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 BP low in hospital after surgery, responded to IVF, vasotec and lasix resumed prior to DC. See instructions re lasix 8.GERD, oral candida likely from steroids, possibly esophageal candida: continue carafate, add protonix as in hospital, add diflucan 9.post parathyroidectomy 2005   All questions answered. Patient is in agreement with recommendations and plans, knows to call if any concerns or questions prior to next scheduled visit. Chemo orders confirmed. IVF/ antiemetics/ granix added for 08-26-14. Disability papers completed by MD. Cc this note to Drs Denman George and Eula Fried Time spent 45 + min including >50% counseling and coordination of care.   Larren Copes P, MD   08/24/2014, 1:06 PM

## 2014-08-24 NOTE — Patient Instructions (Addendum)
[place] Discharge Instructions for Patients Receiving Chemotherapy  Today you received the following chemotherapy agents:  Taxol  To help prevent nausea and vomiting after your treatment, we encourage you to take your nausea medication as ordered per MD.   If you develop nausea and vomiting that is not controlled by your nausea medication, call the clinic.   BELOW ARE SYMPTOMS THAT SHOULD BE REPORTED IMMEDIATELY:  *FEVER GREATER THAN 100.5 F  *CHILLS WITH OR WITHOUT FEVER  NAUSEA AND VOMITING THAT IS NOT CONTROLLED WITH YOUR NAUSEA MEDICATION  *UNUSUAL SHORTNESS OF BREATH  *UNUSUAL BRUISING OR BLEEDING  TENDERNESS IN MOUTH AND THROAT WITH OR WITHOUT PRESENCE OF ULCERS  *URINARY PROBLEMS  *BOWEL PROBLEMS  UNUSUAL RASH Items with * indicate a potential emergency and should be followed up as soon as possible.  Feel free to call the clinic you have any questions or concerns. The clinic phone number is [phone].   Tbo-Filgrastim injection What is this medicine? TBO-FILGRASTIM (T B O fil GRA stim) is a granulocyte colony-stimulating factor that stimulates the growth of neutrophils, a type of white blood cell important in the body's fight against infection. It is used to reduce the incidence of fever and infection in patients with certain types of cancer who are receiving chemotherapy that affects the bone marrow. This medicine may be used for other purposes; ask your health care provider or pharmacist if you have questions. COMMON BRAND NAME(S): Granix What should I tell my health care provider before I take this medicine? They need to know if you have any of these conditions: -ongoing radiation therapy -sickle cell anemia -an unusual or allergic reaction to tbo-filgrastim, filgrastim, pegfilgrastim, other medicines, foods, dyes, or preservatives -pregnant or trying to get pregnant -breast-feeding How should I use this medicine? This medicine is for  injection under the skin. If you get this medicine at home, you will be taught how to prepare and give this medicine. Refer to the Instructions for Use that come with your medication packaging. Use exactly as directed. Take your medicine at regular intervals. Do not take your medicine more often than directed. It is important that you put your used needles and syringes in a special sharps container. Do not put them in a trash can. If you do not have a sharps container, call your pharmacist or healthcare provider to get one. Talk to your pediatrician regarding the use of this medicine in children. Special care may be needed. Overdosage: If you think you've taken too much of this medicine contact a poison control center or emergency room at once. Overdosage: If you think you have taken too much of this medicine contact a poison control center or emergency room at once. NOTE: This medicine is only for you. Do not share this medicine with others. What if I miss a dose? It is important not to miss your dose. Call your doctor or health care professional if you miss a dose. What may interact with this medicine? This medicine may interact with the following medications: -medicines that may cause a release of neutrophils, such as lithium This list may not describe all possible interactions. Give your health care provider a list of all the medicines, herbs, non-prescription drugs, or dietary supplements you use. Also tell them if you smoke, drink alcohol, or use illegal drugs. Some items may interact with your medicine. What should I watch for while using this medicine? You may need blood work done while you are taking this medicine.  What side effects may I notice from receiving this medicine? Side effects that you should report to your doctor or health care professional as soon as possible: -allergic reactions like skin rash, itching or hives, swelling of the face, lips, or tongue -shortness of breath or  breathing problems -fever -pain, redness, or irritation at site where injected -pinpoint red spots on the skin -stomach or side pain, or pain at the shoulder -swelling -tiredness -trouble passing urine Side effects that usually do not require medical attention (Report these to your doctor or health care professional if they continue or are bothersome.): -bone pain -muscle pain This list may not describe all possible side effects. Call your doctor for medical advice about side effects. You may report side effects to FDA at 1-800-FDA-1088. Where should I keep my medicine? Keep out of the reach of children. Store in a refrigerator between 2 and 8 degrees C (36 and 46 degrees F). Keep in carton to protect from light. Throw away this medicine if it is left out of the refrigerator for more than 5 consecutive days. Throw away any unused medicine after the expiration date. NOTE: This sheet is a summary. It may not cover all possible information. If you have questions about this medicine, talk to your doctor, pharmacist, or health care provider.  2015, Elsevier/Gold Standard. (2013-10-27 11:52:29)   We will send prescriptions for 2 medicines to your pharmacy, to start today:  Diflucan (fluconazole) 100 mg One daily for a week, for thrush in your mouth/ may also be in your throat.   Protonix 40 mg Generic or substitute ok, for reflux. One daily.  Take the Protonix and carafate (sucralfate, the pink one) every day even if no nausea or no reflux   We will give you IV fluids/ IV nausea medicine and the neupogen/ granix shot for white blood cells on Saturday 2-6.  Try to sip Ensure thru day in addition to doing the best you can with other fluids and food. Dietician will meet with you at the [facility]. Do not take lasix if you cannot keep down fluids well.   Call if you cannot keep down fluids or if other problems [phone]

## 2014-08-25 ENCOUNTER — Other Ambulatory Visit: Payer: Self-pay | Admitting: Medical Oncology

## 2014-08-25 ENCOUNTER — Telehealth: Payer: Self-pay | Admitting: *Deleted

## 2014-08-25 DIAGNOSIS — J90 Pleural effusion, not elsewhere classified: Secondary | ICD-10-CM | POA: Insufficient documentation

## 2014-08-25 DIAGNOSIS — C50912 Malignant neoplasm of unspecified site of left female breast: Secondary | ICD-10-CM | POA: Insufficient documentation

## 2014-08-25 DIAGNOSIS — C569 Malignant neoplasm of unspecified ovary: Secondary | ICD-10-CM | POA: Insufficient documentation

## 2014-08-25 DIAGNOSIS — Z95828 Presence of other vascular implants and grafts: Secondary | ICD-10-CM | POA: Insufficient documentation

## 2014-08-25 MED ORDER — ONDANSETRON 8 MG/50ML IVPB (CHCC): 8.0000 mg | Freq: Once | INTRAVENOUS | Status: DC | PRN

## 2014-08-25 MED ORDER — DEXAMETHASONE SODIUM PHOSPHATE 10 MG/ML IJ SOLN: 4.0000 mg | Freq: Once | INTRAMUSCULAR | Status: DC

## 2014-08-25 MED ORDER — SODIUM CHLORIDE 0.9 % IV SOLN: INTRAVENOUS | Status: DC

## 2014-08-25 NOTE — Telephone Encounter (Signed)
Per Dr. Marko Plume, patient called to see how she is doing after chemo yesterday. She states she feels fine today - she reports taking her nausea medication as discussed with Dr. Marko Plume yesterday. She said she did throw up once last night when she got home from treatment but took one of the nausea medications (she can't remember which one) and felt much better. Patient agreeable to come in tomorrow morning for IVF and injection at 8am. Reminded patient that on Saturday she needs to go straight back to the infusion room  - patient agreeable to this.

## 2014-08-26 ENCOUNTER — Ambulatory Visit (HOSPITAL_BASED_OUTPATIENT_CLINIC_OR_DEPARTMENT_OTHER): Payer: 59

## 2014-08-26 VITALS — BP 128/82 | HR 80 | Temp 97.7°F | Resp 18

## 2014-08-26 DIAGNOSIS — Z5189 Encounter for other specified aftercare: Secondary | ICD-10-CM

## 2014-08-26 DIAGNOSIS — C801 Malignant (primary) neoplasm, unspecified: Secondary | ICD-10-CM

## 2014-08-26 MED ORDER — HEPARIN SOD (PORK) LOCK FLUSH 100 UNIT/ML IV SOLN
500.0000 [IU] | Freq: Once | INTRAVENOUS | Status: AC
  Administered 2014-08-26: 500 [IU] via INTRAVENOUS
  Filled 2014-08-26: qty 5

## 2014-08-26 MED ORDER — TBO-FILGRASTIM 300 MCG/0.5ML ~~LOC~~ SOSY
300.0000 ug | PREFILLED_SYRINGE | Freq: Once | SUBCUTANEOUS | Status: AC
  Administered 2014-08-26: 300 ug via SUBCUTANEOUS

## 2014-08-26 MED ORDER — SODIUM CHLORIDE 0.9 % IV SOLN
INTRAVENOUS | Status: DC
  Administered 2014-08-26: 08:00:00 via INTRAVENOUS

## 2014-08-26 MED ORDER — SODIUM CHLORIDE 0.9 % IJ SOLN
10.0000 mL | INTRAMUSCULAR | Status: DC | PRN
  Administered 2014-08-26: 10 mL via INTRAVENOUS
  Filled 2014-08-26: qty 10

## 2014-08-26 NOTE — Patient Instructions (Signed)
Dehydration, Adult Dehydration is when you lose more fluids from the body than you take in. Vital organs like the kidneys, brain, and heart cannot function without a proper amount of fluids and salt. Any loss of fluids from the body can cause dehydration.  CAUSES   Vomiting.  Diarrhea.  Excessive sweating.  Excessive urine output.  Fever. SYMPTOMS  Mild dehydration  Thirst.  Dry lips.  Slightly dry mouth. Moderate dehydration  Very dry mouth.  Sunken eyes.  Skin does not bounce back quickly when lightly pinched and released.  Dark urine and decreased urine production.  Decreased tear production.  Headache. Severe dehydration  Very dry mouth.  Extreme thirst.  Rapid, weak pulse (more than 100 beats per minute at rest).  Cold hands and feet.  Not able to sweat in spite of heat and temperature.  Rapid breathing.  Blue lips.  Confusion and lethargy.  Difficulty being awakened.  Minimal urine production.  No tears. DIAGNOSIS  Your caregiver will diagnose dehydration based on your symptoms and your exam. Blood and urine tests will help confirm the diagnosis. The diagnostic evaluation should also identify the cause of dehydration. TREATMENT  Treatment of mild or moderate dehydration can often be done at home by increasing the amount of fluids that you drink. It is best to drink small amounts of fluid more often. Drinking too much at one time can make vomiting worse. Refer to the home care instructions below. Severe dehydration needs to be treated at the hospital where you will probably be given intravenous (IV) fluids that contain water and electrolytes. HOME CARE INSTRUCTIONS   Ask your caregiver about specific rehydration instructions.  Drink enough fluids to keep your urine clear or pale yellow.  Drink small amounts frequently if you have nausea and vomiting.  Eat as you normally do.  Avoid:  Foods or drinks high in sugar.  Carbonated  drinks.  Juice.  Extremely hot or cold fluids.  Drinks with caffeine.  Fatty, greasy foods.  Alcohol.  Tobacco.  Overeating.  Gelatin desserts.  Wash your hands well to avoid spreading bacteria and viruses.  Only take over-the-counter or prescription medicines for pain, discomfort, or fever as directed by your caregiver.  Ask your caregiver if you should continue all prescribed and over-the-counter medicines.  Keep all follow-up appointments with your caregiver. SEEK MEDICAL CARE IF:  You have abdominal pain and it increases or stays in one area (localizes).  You have a rash, stiff neck, or severe headache.  You are irritable, sleepy, or difficult to awaken.  You are weak, dizzy, or extremely thirsty. SEEK IMMEDIATE MEDICAL CARE IF:   You are unable to keep fluids down or you get worse despite treatment.  You have frequent episodes of vomiting or diarrhea.  You have blood or green matter (bile) in your vomit.  You have blood in your stool or your stool looks black and tarry.  You have not urinated in 6 to 8 hours, or you have only urinated a small amount of very dark urine.  You have a fever.  You faint. MAKE SURE YOU:   Understand these instructions.  Will watch your condition.  Will get help right away if you are not doing well or get worse. Document Released: 07/07/2005 Document Revised: 09/29/2011 Document Reviewed: 02/24/2011 ExitCare Patient Information 2015 [employer]. This information is not intended to replace advice given to you by your health care provider. Make sure you discuss any questions you have with your health care   provider.  

## 2014-08-26 NOTE — Progress Notes (Signed)
Pt took zofran before she came in this am . She refused decadron today and said she is not nauseated.

## 2014-08-28 ENCOUNTER — Telehealth: Payer: Self-pay

## 2014-08-28 NOTE — Telephone Encounter (Signed)
Told Kathleen Bond that Dr. Mariana Kaufman portion of the disability form was completed.  She said she would pick up the form at visit 08-31-14. Sent a copy of the signed Physician statement to HIM to be scanned into patient's EMR.

## 2014-08-31 ENCOUNTER — Other Ambulatory Visit: Payer: Self-pay | Admitting: Oncology

## 2014-08-31 ENCOUNTER — Telehealth: Payer: Self-pay | Admitting: *Deleted

## 2014-08-31 ENCOUNTER — Encounter: Payer: 59 | Admitting: Nutrition

## 2014-08-31 ENCOUNTER — Encounter: Payer: Self-pay | Admitting: Oncology

## 2014-08-31 ENCOUNTER — Other Ambulatory Visit (HOSPITAL_BASED_OUTPATIENT_CLINIC_OR_DEPARTMENT_OTHER): Payer: 59

## 2014-08-31 ENCOUNTER — Telehealth: Payer: Self-pay | Admitting: Oncology

## 2014-08-31 ENCOUNTER — Ambulatory Visit (HOSPITAL_BASED_OUTPATIENT_CLINIC_OR_DEPARTMENT_OTHER): Payer: 59 | Admitting: Oncology

## 2014-08-31 ENCOUNTER — Ambulatory Visit: Payer: 59

## 2014-08-31 VITALS — BP 127/70 | HR 79 | Temp 98.9°F | Resp 18 | Ht 62.0 in | Wt 118.1 lb

## 2014-08-31 DIAGNOSIS — C569 Malignant neoplasm of unspecified ovary: Secondary | ICD-10-CM

## 2014-08-31 DIAGNOSIS — Z853 Personal history of malignant neoplasm of breast: Secondary | ICD-10-CM

## 2014-08-31 DIAGNOSIS — Z5189 Encounter for other specified aftercare: Secondary | ICD-10-CM

## 2014-08-31 DIAGNOSIS — D701 Agranulocytosis secondary to cancer chemotherapy: Secondary | ICD-10-CM

## 2014-08-31 DIAGNOSIS — C50912 Malignant neoplasm of unspecified site of left female breast: Secondary | ICD-10-CM

## 2014-08-31 DIAGNOSIS — T451X5A Adverse effect of antineoplastic and immunosuppressive drugs, initial encounter: Secondary | ICD-10-CM

## 2014-08-31 DIAGNOSIS — Z95828 Presence of other vascular implants and grafts: Secondary | ICD-10-CM

## 2014-08-31 DIAGNOSIS — C801 Malignant (primary) neoplasm, unspecified: Secondary | ICD-10-CM

## 2014-08-31 DIAGNOSIS — D72819 Decreased white blood cell count, unspecified: Secondary | ICD-10-CM

## 2014-08-31 DIAGNOSIS — J90 Pleural effusion, not elsewhere classified: Secondary | ICD-10-CM

## 2014-08-31 DIAGNOSIS — E43 Unspecified severe protein-calorie malnutrition: Secondary | ICD-10-CM

## 2014-08-31 DIAGNOSIS — R180 Malignant ascites: Secondary | ICD-10-CM

## 2014-08-31 LAB — CBC WITH DIFFERENTIAL/PLATELET
BASO%: 0.3 % (ref 0.0–2.0)
BASOS ABS: 0 10*3/uL (ref 0.0–0.1)
EOS%: 0.3 % (ref 0.0–7.0)
Eosinophils Absolute: 0 10*3/uL (ref 0.0–0.5)
HCT: 31.8 % — ABNORMAL LOW (ref 34.8–46.6)
HEMOGLOBIN: 10.3 g/dL — AB (ref 11.6–15.9)
LYMPH#: 1.2 10*3/uL (ref 0.9–3.3)
LYMPH%: 37.9 % (ref 14.0–49.7)
MCH: 28 pg (ref 25.1–34.0)
MCHC: 32.4 g/dL (ref 31.5–36.0)
MCV: 86.4 fL (ref 79.5–101.0)
MONO#: 0.6 10*3/uL (ref 0.1–0.9)
MONO%: 19.9 % — AB (ref 0.0–14.0)
NEUT%: 41.6 % (ref 38.4–76.8)
NEUTROS ABS: 1.3 10*3/uL — AB (ref 1.5–6.5)
PLATELETS: 338 10*3/uL (ref 145–400)
RBC: 3.68 10*6/uL — ABNORMAL LOW (ref 3.70–5.45)
RDW: 15.1 % — AB (ref 11.2–14.5)
WBC: 3.2 10*3/uL — ABNORMAL LOW (ref 3.9–10.3)

## 2014-08-31 LAB — COMPREHENSIVE METABOLIC PANEL (CC13)
ALBUMIN: 2.7 g/dL — AB (ref 3.5–5.0)
ALT: 21 U/L (ref 0–55)
ANION GAP: 12 meq/L — AB (ref 3–11)
AST: 23 U/L (ref 5–34)
Alkaline Phosphatase: 73 U/L (ref 40–150)
BUN: 7.6 mg/dL (ref 7.0–26.0)
CALCIUM: 8.3 mg/dL — AB (ref 8.4–10.4)
CHLORIDE: 104 meq/L (ref 98–109)
CO2: 26 meq/L (ref 22–29)
Creatinine: 0.7 mg/dL (ref 0.6–1.1)
Glucose: 87 mg/dl (ref 70–140)
Potassium: 2.9 mEq/L — CL (ref 3.5–5.1)
Sodium: 142 mEq/L (ref 136–145)
TOTAL PROTEIN: 6.1 g/dL — AB (ref 6.4–8.3)
Total Bilirubin: <0.2 mg/dL (ref 0.20–1.20)

## 2014-08-31 MED ORDER — FILGRASTIM 300 MCG/0.5ML IJ SOSY: 300.0000 ug | PREFILLED_SYRINGE | Freq: Once | INTRAMUSCULAR | Status: DC

## 2014-08-31 MED ORDER — TBO-FILGRASTIM 300 MCG/0.5ML ~~LOC~~ SOSY
300.0000 ug | PREFILLED_SYRINGE | Freq: Once | SUBCUTANEOUS | Status: AC
  Administered 2014-08-31: 300 ug via SUBCUTANEOUS
  Filled 2014-08-31: qty 0.5

## 2014-08-31 NOTE — Progress Notes (Signed)
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 Debulking was not possible at exploratory laparotomy by Dr Denman George on 08-15-14; day 1 cycle 1 dose dense carboplatin taxol was given on 08-18-14. Paracentesis 07-27-14 for 3.4 liters, at surgery 4 liters, 08-17-14 for 1.3 liters. Right thoracentesis 350 cc 08-11-14.  She did not take premed steroids for taxol today and  ANC is only 1.3 despite granix on 2-6, so will hold treatment this week. She is to see [place] also today.  Patient had "day 8" cycle 1 at [facility] on 08-24-14. Because of significant nausea after day 1 chemo, she had IVF on 08-26-13, with granix. Nausea was not significant after the day 8 taxol, and po intake has improved, including grilled cheese, vegetable beef soup and spaghetti in past 2 days. GERD and esophagitis symptoms are essentially gone, post diflucan and on carafate and protonix. She cannot tell any increase in ascites and denies SOB or chest discomfort. Energy has improved in last 24 hours. Bowels are moving 1-2x daily. Staples are out and she is washing the surgical incision in shower.  PAC placed by IR 08-17-14 No flu vaccine No genetics testing  ONCOLOGIC HISTORY Gyn cancer: Patient became symptomatic with abdominal swelling and constipation in late Nov 2015, seen and treated initially at [facility] ED and by PCP Dr Cleda Mccreedy for what was thought to be ulcer. Symptoms continued, with early satiety causing poor po intake; she was seen again at [facility] ED in early Jan 2016, with CT reportedly consistent with stage IIIC or IV gyn cancer, including omental caking, large pelvic mass, para-aortic adenopathy, ascites,  pleural effusions and right subpleural nodule. She was seen in consultation by Dr Denman George on 07-25-14, with concern from her exam and scan for rectal involvement. 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 She had paracentesis of 3.4 liters ascites on 07-27-2014, with cytology ([id number]) showing serous carcinoma consistent with gyn primary. She had right thoracentesis for 350 cc on 08-11-14, this requested by anesthesia prior to surgery and apparently not sent for cytology. Patient was more comfortable after paracentesis, but could not tell any difference in breathing after thoracentesis (denies SOB prior to that procedure). CA 125 on 08-02-14 was 1162. She was taken to exploratory laparotomy by Dr Denman George on 08-15-14, with 4 liters of ascites removed and biopsy of omentum obtained, with additional debulking not attempted due to surgical findings of entire peritoneal surface replaced by tumor plaque, small intestine coated with tumor and mesentery tethered with tumor nodules, ileum adherent to pelvic mass, dense plaque of tumor on right diaphragm and 10 cm omental cake in LUQ. Pathology of omental biopsy She needed another paracentesis by IR for 1.3 liters ascites on 08-17-14, and had PAC placed by IR also 08-17-14. Day 1 cycle 1 dose dense carboplatin taxol was given in hospital on 08-18-14.   Breast cancer: Left breast biopsy 11-22-2003 ([id number]) invasive carcinoma. Left breast wide excision with sentinel nodes and axillary contents by Dr Benson Norway 01-03-2004 [phone]) poorly differentiated invasive ductal carcinoma 3.0 cm with closest margin 1.7 cm deep, 6 sentinel nodes + 3 additional nodes all negative, ER 0, PR 0, HER 2 3+, Ki67 of  19%. . She received adjuvant chemotherapy by Dr Sonny Dandy with 5FU, epirubicin, cytoxan x 4 cycles from 02-13-2004 thru 04-16-2004, followed by taxol 80 mg/m2 weekly x 11 from 05-07-2004 thru 09-04-2004, and herceptin  05-21-2004 thru 02-25-2005. Antiemetics were Kytril and decadron. She had PAC for the chemotherapy. She had radiation by Dr Tyler Pita in [place]. Last note from Dr Sonny Dandy in this information was 04-2008, tho year follow up was recommended then.  Most recent mammograms were in [place] 02-2014, reportedly not remarkable.    Review of systems as above, also: No fever or symptoms of infection. No LE swelling. No abdominal or pelvic pain. No peripheral neuropathy. PAC functioned well, not sore. Bladder ok.  Remainder of 10 point Review of Systems negative.  Objective:  Vital signs in last 24 hours:  BP 127/70 mmHg  Pulse 79  Temp(Src) 98.9 F (37.2 C) (Oral)  Resp 18  Ht _0  (1.575 m)  Wt 118 lb 1.6 oz (53.57 kg)  BMI 21.60 kg/m2 Weight is down 0.5 lbs. Alert, oriented and appropriate. Ambulatory without difficulty.  No alopecia. Looks more comfortable today  HEENT:PERRL, sclerae not icteric. Oral mucosa moist without lesions, posterior pharynx clear, no thrush now Sill periorbital edema.  Neck supple. No JVD.  Lymphatics:no cervical,supraclavicular, axillary adenopathy Resp: clear to auscultation bilaterally other than decreased BS just in bases posteriorly, and clear to percussion other than dullness just in bases bilaterally Cardio: regular rate and rhythm. No gallop.Clear heart sounds GI: soft, nontender, full but no increased distension and not tight. Normally active bowel sounds. Surgical incision closed, no erythema Musculoskeletal/ Extremities: without pitting edema, cords, tenderness Neuro: no peripheral neuropathy. Otherwise nonfocal. PSYCH appropriate mood and affect Skin without rash, ecchymosis, petechiae Axillae benign. Portacath-without erythema or tenderness, surgical glue still in place, good position.  Lab Results:  Results for orders placed or performed in visit on 08/31/14  CBC with Differential  Result Value Ref Range   WBC 3.2 (L) 3.9 - 10.3 10e3/uL   NEUT# 1.3  (L) 1.5 - 6.5 10e3/uL   HGB 10.3 (L) 11.6 - 15.9 g/dL   HCT 31.8 (L) 34.8 - 46.6 %   Platelets 338 145 - 400 10e3/uL   MCV 86.4 79.5 - 101.0 fL   MCH 28.0 25.1 - 34.0 pg   MCHC 32.4 31.5 - 36.0 g/dL   RBC 3.68 (L) 3.70 - 5.45 10e6/uL   RDW 15.1 (H) 11.2 - 14.5 %   lymph# 1.2 0.9 - 3.3 10e3/uL   MONO# 0.6 0.1 - 0.9 10e3/uL   Eosinophils Absolute 0.0 0.0 - 0.5 10e3/uL   Basophils Absolute 0.0 0.0 - 0.1 10e3/uL   NEUT% 41.6 38.4 - 76.8 %   LYMPH% 37.9 14.0 - 49.7 %   MONO% 19.9 (H) 0.0 - 14.0 %   EOS% 0.3 0.0 - 7.0 %   BASO% 0.3 0.0 - 2.0 %  Comprehensive metabolic panel (Cmet) - [facility]  Result Value Ref Range   Sodium 142 136 - 145 mEq/L   Potassium 2.9 (LL) 3.5 - 5.1 mEq/L   Chloride 104 98 - 109 mEq/L   CO2 26 22 - 29 mEq/L   Glucose 87 70 - 140 mg/dl   BUN 7.6 7.0 - 26.0 mg/dL   Creatinine 0.7 0.6 - 1.1 mg/dL   Total Bilirubin <0.20 0.20 - 1.20 mg/dL   Alkaline Phosphatase 73 40 - 150 U/L   AST 23 5 - 34 U/L   ALT 21 0 - 55 U/L   Total  Protein 6.1 (L) 6.4 - 8.3 g/dL   Albumin 2.7 (L) 3.5 - 5.0 g/dL   Calcium 8.3 (L) 8.4 - 10.4 mg/dL   Anion Gap 12 (H) 3 - 11 mEq/L   EGFR >90 >90 ml/min/1.73 m2    Will recheck CA125 with upcoming labs, this 1162 on 08-02-14.  Studies/Results:  No results found.  Medications: I have reviewed the patient's current medications. Reviewed premedication decadron for each taxol. Granix today.   DISCUSSION: I have told patient the information about her previous chemotherapy for the left breast cancer in 2005 , this ER PR negative and HER 2 3+ and treated with FEC x 4 followed by weekly taxol x 10, plus herceptin. Discussed encouraging information that ascites already does not seem to be increasing as quickly, and that her appetite and energy are improved. WIll follow CA 125 in addition to exams and repeat scans as treatment continues. Discussed blood counts today and adjustments in chemotherapy. She may not need extra IVF day after treatments if  she continues to improve.  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 Genetics testing needed when more stable overall. Gyn onc following. Hold day 15 cycle 1 taxol today due to leukopenia. I will see her 2-18 with day 1 cycle 2 carbo taxol same day, then neupogen and possibly IVF on 2-19, with Dr Serita Grit appointment also that day. 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 Prior records received. Up to date on mammograms. Genetics testing as above 4.Pleural effusions and ascites not more symptomatic today.  5.severe malnutrition in context of chronic illness: as above, [place] nutritionist aware and will assist, po intake much better just this week. 6.constipation resolved: tumor in proximity to rectum. Needs stool softeners and prn laxatives ongoing.  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 BP low in hospital after surgery, responded to IVF, vasotec and lasix resumed prior to DC. See instructions re lasix 8.GERD, oral candida likely from steroids, possibly esophageal candida: symptoms better with diflucan, continuing carafate and protonix 9.post parathyroidectomy 2005   All questions answered and patient is in agreement with plans and recommendations as above. She knows to call if problems prior to next appointments.  Chemo orders adjusted. Patient in agreement with keeping treatments on Thursdays. Time spent 25 min including >50% counseling and coordination of  care.  Shamond Skelton P, MD   08/31/2014, 11:38 AM

## 2014-08-31 NOTE — Telephone Encounter (Signed)
, °

## 2014-08-31 NOTE — Telephone Encounter (Signed)
Per staff message and POF I have scheduled appts. Advised scheduler of appts. JMW  

## 2014-09-01 ENCOUNTER — Telehealth: Payer: Self-pay | Admitting: Oncology

## 2014-09-01 ENCOUNTER — Other Ambulatory Visit: Payer: Self-pay | Admitting: Oncology

## 2014-09-01 ENCOUNTER — Encounter: Payer: Self-pay | Admitting: Oncology

## 2014-09-01 ENCOUNTER — Telehealth: Payer: Self-pay | Admitting: *Deleted

## 2014-09-01 DIAGNOSIS — T451X5A Adverse effect of antineoplastic and immunosuppressive drugs, initial encounter: Secondary | ICD-10-CM

## 2014-09-01 DIAGNOSIS — D701 Agranulocytosis secondary to cancer chemotherapy: Secondary | ICD-10-CM | POA: Insufficient documentation

## 2014-09-01 DIAGNOSIS — R180 Malignant ascites: Secondary | ICD-10-CM | POA: Insufficient documentation

## 2014-09-01 DIAGNOSIS — E43 Unspecified severe protein-calorie malnutrition: Secondary | ICD-10-CM | POA: Insufficient documentation

## 2014-09-01 DIAGNOSIS — C569 Malignant neoplasm of unspecified ovary: Secondary | ICD-10-CM

## 2014-09-01 NOTE — Telephone Encounter (Signed)
per pof to CX & add appt/inj-sent MW email to add IVF appt-pt to get updated copy of sch on 2/18

## 2014-09-01 NOTE — Telephone Encounter (Signed)
Per staff message and POF I have scheduled appts. Advised scheduler of appts. JMW  

## 2014-09-01 NOTE — Progress Notes (Signed)
Faxed short term disability forms to [employer] [phone]

## 2014-09-01 NOTE — Telephone Encounter (Signed)
per pof to r/s a few appts-cld pt to make aware of updated appts-adv to gwet a updated sch on 2/18 appt-[t understood

## 2014-09-02 ENCOUNTER — Ambulatory Visit: Payer: 59

## 2014-09-04 ENCOUNTER — Ambulatory Visit: Payer: 59

## 2014-09-05 ENCOUNTER — Telehealth: Payer: Self-pay | Admitting: Oncology

## 2014-09-05 NOTE — Telephone Encounter (Signed)
Medical Oncology  Potassium from 08-31-14 low at 2.9, which has not been problem that I have known previously. Meds reviewed, not on K supplement, does have lasix listed. Called home #, sister answered and told me patient is feeling well and has gone on an outing. I asked that she call back to RN so that we can adjust medications prior to visit with labs as scheduled on 2-18.  When she returns call, RN please let her know to hold lasix and increase potassium in diet. If she has previously been on K, need to restart that.   Godfrey Pick, MD

## 2014-09-06 ENCOUNTER — Telehealth: Payer: Self-pay | Admitting: *Deleted

## 2014-09-06 NOTE — Telephone Encounter (Signed)
Patient left voicemail at 1048 asking about her medications.  Denies being on any potassium supplements.  Called patient to notify her of low potassium level on 08-31-2014.  She denies n/v or diarrhea and "I take the lasix only of needed".  Instructed not to take the lasix until further notice.  Reviewed foods high in potassium like fruit yogurts, bananas, and beans.  She will eat these and lab recheck tomorrow.

## 2014-09-07 ENCOUNTER — Other Ambulatory Visit (HOSPITAL_BASED_OUTPATIENT_CLINIC_OR_DEPARTMENT_OTHER): Payer: 59

## 2014-09-07 ENCOUNTER — Encounter: Payer: Self-pay | Admitting: Oncology

## 2014-09-07 ENCOUNTER — Telehealth: Payer: Self-pay

## 2014-09-07 ENCOUNTER — Ambulatory Visit: Payer: 59

## 2014-09-07 ENCOUNTER — Other Ambulatory Visit: Payer: Self-pay | Admitting: Oncology

## 2014-09-07 ENCOUNTER — Ambulatory Visit (HOSPITAL_BASED_OUTPATIENT_CLINIC_OR_DEPARTMENT_OTHER): Payer: 59

## 2014-09-07 ENCOUNTER — Ambulatory Visit (HOSPITAL_BASED_OUTPATIENT_CLINIC_OR_DEPARTMENT_OTHER): Payer: 59 | Admitting: Oncology

## 2014-09-07 VITALS — BP 135/86 | HR 103 | Temp 98.5°F | Resp 18 | Ht 62.0 in | Wt 119.3 lb

## 2014-09-07 DIAGNOSIS — C569 Malignant neoplasm of unspecified ovary: Secondary | ICD-10-CM

## 2014-09-07 DIAGNOSIS — Z95828 Presence of other vascular implants and grafts: Secondary | ICD-10-CM

## 2014-09-07 DIAGNOSIS — E876 Hypokalemia: Secondary | ICD-10-CM

## 2014-09-07 DIAGNOSIS — R180 Malignant ascites: Secondary | ICD-10-CM

## 2014-09-07 DIAGNOSIS — T502X5A Adverse effect of carbonic-anhydrase inhibitors, benzothiadiazides and other diuretics, initial encounter: Secondary | ICD-10-CM

## 2014-09-07 DIAGNOSIS — E43 Unspecified severe protein-calorie malnutrition: Secondary | ICD-10-CM

## 2014-09-07 DIAGNOSIS — B370 Candidal stomatitis: Secondary | ICD-10-CM

## 2014-09-07 DIAGNOSIS — C801 Malignant (primary) neoplasm, unspecified: Secondary | ICD-10-CM

## 2014-09-07 DIAGNOSIS — Z5111 Encounter for antineoplastic chemotherapy: Secondary | ICD-10-CM

## 2014-09-07 DIAGNOSIS — J90 Pleural effusion, not elsewhere classified: Secondary | ICD-10-CM

## 2014-09-07 DIAGNOSIS — Z853 Personal history of malignant neoplasm of breast: Secondary | ICD-10-CM

## 2014-09-07 LAB — COMPREHENSIVE METABOLIC PANEL (CC13)
ALBUMIN: 3 g/dL — AB (ref 3.5–5.0)
ALK PHOS: 83 U/L (ref 40–150)
ALT: 10 U/L (ref 0–55)
AST: 15 U/L (ref 5–34)
Anion Gap: 12 mEq/L — ABNORMAL HIGH (ref 3–11)
BUN: 10.8 mg/dL (ref 7.0–26.0)
CHLORIDE: 105 meq/L (ref 98–109)
CO2: 23 mEq/L (ref 22–29)
Calcium: 9.1 mg/dL (ref 8.4–10.4)
Creatinine: 0.7 mg/dL (ref 0.6–1.1)
EGFR: >90 mL/min/{1.73_m2} (ref >90)
Glucose: 239 mg/dl — ABNORMAL HIGH (ref 70–140)
Potassium: 3.8 mEq/L (ref 3.5–5.1)
Sodium: 140 mEq/L (ref 136–145)
Total Bilirubin: 0.24 mg/dL (ref 0.20–1.20)
Total Protein: 7 g/dL (ref 6.4–8.3)

## 2014-09-07 LAB — CBC WITH DIFFERENTIAL/PLATELET
BASO%: 0.3 % (ref 0.0–2.0)
Basophils Absolute: 0 10*3/uL (ref 0.0–0.1)
EOS%: 0 % (ref 0.0–7.0)
Eosinophils Absolute: 0 10*3/uL (ref 0.0–0.5)
HCT: 31.9 % — ABNORMAL LOW (ref 34.8–46.6)
HEMOGLOBIN: 10.5 g/dL — AB (ref 11.6–15.9)
LYMPH#: 0.9 10*3/uL (ref 0.9–3.3)
LYMPH%: 21.9 % (ref 14.0–49.7)
MCH: 27.8 pg (ref 25.1–34.0)
MCHC: 32.9 g/dL (ref 31.5–36.0)
MCV: 84.4 fL (ref 79.5–101.0)
MONO#: 0 10*3/uL — ABNORMAL LOW (ref 0.1–0.9)
MONO%: 0.8 % (ref 0.0–14.0)
NEUT#: 3 10*3/uL (ref 1.5–6.5)
NEUT%: 77 % — ABNORMAL HIGH (ref 38.4–76.8)
NRBC: 0 % (ref 0–0)
Platelets: 352 10*3/uL (ref 145–400)
RBC: 3.78 10*6/uL (ref 3.70–5.45)
RDW: 15.6 % — AB (ref 11.2–14.5)
WBC: 3.9 10*3/uL (ref 3.9–10.3)

## 2014-09-07 MED ORDER — DEXAMETHASONE 4 MG PO TABS
ORAL_TABLET | ORAL | Status: DC
Start: 2014-09-07 — End: 2014-09-25

## 2014-09-07 MED ORDER — ONDANSETRON 16 MG/50ML IVPB (CHCC)
16.0000 mg | Freq: Once | INTRAVENOUS | Status: AC
  Administered 2014-09-07: 16 mg via INTRAVENOUS

## 2014-09-07 MED ORDER — ONDANSETRON 16 MG/50ML IVPB (CHCC)
INTRAVENOUS | Status: AC
Start: 2014-09-07 — End: 2014-09-07
  Filled 2014-09-07: qty 16

## 2014-09-07 MED ORDER — DIPHENHYDRAMINE HCL 50 MG/ML IJ SOLN
25.0000 mg | Freq: Once | INTRAMUSCULAR | Status: AC
  Administered 2014-09-07: 25 mg via INTRAVENOUS

## 2014-09-07 MED ORDER — SODIUM CHLORIDE 0.9 % IV SOLN
Freq: Once | INTRAVENOUS | Status: AC
  Administered 2014-09-07: 11:00:00 via INTRAVENOUS

## 2014-09-07 MED ORDER — DEXAMETHASONE SODIUM PHOSPHATE 20 MG/5ML IJ SOLN
20.0000 mg | Freq: Once | INTRAMUSCULAR | Status: AC
  Administered 2014-09-07: 20 mg via INTRAVENOUS

## 2014-09-07 MED ORDER — CARBOPLATIN CHEMO INJECTION 600 MG/60ML
447.0000 mg | Freq: Once | INTRAVENOUS | Status: AC
  Administered 2014-09-07: 450 mg via INTRAVENOUS
  Filled 2014-09-07: qty 45

## 2014-09-07 MED ORDER — FAMOTIDINE IN NACL 20-0.9 MG/50ML-% IV SOLN
INTRAVENOUS | Status: AC
  Filled 2014-09-07: qty 50

## 2014-09-07 MED ORDER — DIPHENHYDRAMINE HCL 50 MG/ML IJ SOLN
INTRAMUSCULAR | Status: AC
  Filled 2014-09-07: qty 1

## 2014-09-07 MED ORDER — FAMOTIDINE IN NACL 20-0.9 MG/50ML-% IV SOLN
20.0000 mg | Freq: Once | INTRAVENOUS | Status: AC
  Administered 2014-09-07: 20 mg via INTRAVENOUS

## 2014-09-07 MED ORDER — PACLITAXEL CHEMO INJECTION 300 MG/50ML
80.0000 mg/m2 | Freq: Once | INTRAVENOUS | Status: AC
  Administered 2014-09-07: 126 mg via INTRAVENOUS
  Filled 2014-09-07: qty 21

## 2014-09-07 MED ORDER — HEPARIN SOD (PORK) LOCK FLUSH 100 UNIT/ML IV SOLN
500.0000 [IU] | Freq: Once | INTRAVENOUS | Status: AC | PRN
  Administered 2014-09-07: 500 [IU]
  Filled 2014-09-07: qty 5

## 2014-09-07 MED ORDER — DEXAMETHASONE SODIUM PHOSPHATE 20 MG/5ML IJ SOLN
INTRAMUSCULAR | Status: AC
  Filled 2014-09-07: qty 5

## 2014-09-07 MED ORDER — ONDANSETRON HCL 8 MG PO TABS
8.0000 mg | ORAL_TABLET | Freq: Three times a day (TID) | ORAL | Status: DC | PRN
Start: 2014-09-07 — End: 2015-07-17

## 2014-09-07 MED ORDER — SODIUM CHLORIDE 0.9 % IJ SOLN
10.0000 mL | INTRAMUSCULAR | Status: DC | PRN
  Administered 2014-09-07: 10 mL
  Filled 2014-09-07: qty 10

## 2014-09-07 NOTE — Progress Notes (Signed)
OFFICE PROGRESS NOTE   September 07, 2014   Physicians:K.Karb, M.Manning), E.Rossi, Cleda Mccreedy (PCP), [name]  INTERVAL HISTORY:  Patient is seen, alone for visit, in continuing attention to chemotherapy in process for advanced serous gyn carcinoma, for day 1 cycle 2 dose dense carbo taxol today. Day 15 cycle 1 was held last week with leukopenia, such that she will now have granix day after each treatment. She is feeling much better overall, with improved appetite, no SOB and no reaccumulation of ascites apparent.  Patient has continued to take zofran and compazine regularly, tho she has had no nausea (see below). She has tolerated some increase in activity, tho she was in bed most of yesterday after doing a little shopping the day prior. Appetite and po intake are much better, with 24 hour recall including oatmeal, banana, taco soup, fried chicken, cabbage, navy beans, fish and slaw. Bowels are moving 2x daily without diarrhea, still on colace once daily. She denies any peripheral neuropathy, and does not recall this with taxol used for breast cancer previously. Throat irritation resolved with diflucan. Had low back aches after granix given 08-31-14, resolved in just a few hours with rest.  PAC placed by IR 08-17-14 No flu vaccine No genetics testing  ONCOLOGIC HISTORY Gyn cancer: Patient became symptomatic with abdominal swelling and constipation in late Nov 2015, seen and treated initially at [facility] ED and by PCP Dr Cleda Mccreedy for what was thought to be ulcer. Symptoms continued, with early satiety causing poor po intake; she was seen again at [facility] ED in early Jan 2016, with CT reportedly consistent with stage IIIC or IV gyn cancer, including omental caking, large pelvic mass, para-aortic adenopathy, ascites, pleural effusions and right subpleural nodule. She was seen in consultation by Dr Denman George on 07-25-14, with concern from her exam and scan for rectal involvement. She  had preoperative cardiology consultation with Dr Mertie Moores, with no interventions needed (RBBB and LAFB). She had paracentesis of 3.4 liters ascites on 07-27-2014, with cytology ([id number]) showing serous carcinoma consistent with gyn primary. She had right thoracentesis for 350 cc on 08-11-14, this requested by anesthesia prior to surgery and apparently not sent for cytology. Patient was more comfortable after paracentesis, but could not tell any difference in breathing after thoracentesis (denies SOB prior to that procedure). CA 125 on 08-02-14 was 1162. She was taken to exploratory laparotomy by Dr Denman George on 08-15-14, with 4 liters of ascites removed and biopsy of omentum obtained, with additional debulking not attempted due to surgical findings of entire peritoneal surface replaced by tumor plaque, small intestine coated with tumor and mesentery tethered with tumor nodules, ileum adherent to pelvic mass, dense plaque of tumor on right diaphragm and 10 cm omental cake in LUQ. Pathology of omental biopsy She needed another paracentesis by IR for 1.3 liters ascites on 08-17-14, and had PAC placed by IR also 08-17-14. Day 1 cycle 1 dose dense carboplatin taxol was given in hospital on 08-18-14.   Breast cancer: Left breast biopsy 11-22-2003 ([id number]) invasive carcinoma. Left breast wide excision with sentinel nodes and axillary contents by Dr Benson Norway 01-03-2004 [id number]) poorly differentiated invasive ductal carcinoma 3.0 cm with closest margin 1.7 cm deep, 6 sentinel nodes + 3 additional nodes all negative, ER 0, PR 0, HER 2 3+, Ki67 of 19%. . She received adjuvant chemotherapy by Dr Sonny Dandy with 5FU, epirubicin, cytoxan x 4 cycles from 02-13-2004 thru 04-16-2004, followed by taxol 80 mg/m2 weekly x 11 from 05-07-2004  thru 09-04-2004, and herceptin 05-21-2004 thru 02-25-2005. Antiemetics were Kytril and decadron. She had PAC for the chemotherapy. She had radiation by Dr Tyler Pita in [place]. Last note from Dr Sonny Dandy  in this information was 04-2008, tho year follow up was recommended then.  Most recent mammograms were in [place] 02-2014, reportedly not remarkable.    Review of systems as above, also: Starting to lose hair. No mucositis symptoms. No fever or symptoms of infection. No cough or chest pain. No problems with PAC. Some pulling at surgical scar with certain positions. No LE swelling. No pain. Remainder of 10 point Review of Systems negative.  Objective:  Vital signs in last 24 hours:  BP 135/86 mmHg  Pulse 103  Temp(Src) 98.5 F (36.9 C) (Oral)  Resp 18  Ht 5' 2"  (1.575 m)  Wt 119 lb 4.8 oz (54.114 kg)  BMI 21.81 kg/m2 Weight is up 1 lb. Alert, oriented and appropriate. Ambulatory without difficulty. Respirations not labored RA. Hair is thinning  HEENT:PERRL, sclerae not icteric. Oral mucosa moist without lesions, posterior pharynx clear.  Neck supple. No JVD.  Lymphatics:no cervical,supraclavicular, axillary or inguinal adenopathy Resp: slight decrease in BS and slight dullness right base posteriorly, otherwise clear to auscultation bilaterally and normal percussion bilaterally Cardio: regular rate and rhythm. No gallop. GI: soft, nontender, not obviously distended tho patient tells me that this is more full still than baseline, no clear mass or organomegaly, not as full across upper abdomen as my last exam. Some normal bowel sounds. Surgical incision not remarkable. Musculoskeletal/ Extremities: without pitting edema, cords, tenderness UE or LE. Neuro: no peripheral neuropathy. Otherwise nonfocal Skin without rash, ecchymosis, petechiae Breasts: bilaterally without dominant mass, skin or nipple findings. Surgical and radiation changes on right stable. Axillae benign. Portacath-without erythema or tenderness  Lab Results:  Results for orders placed or performed in visit on 09/07/14  CBC with Differential  Result Value Ref Range   WBC 3.9 3.9 - 10.3 10e3/uL   NEUT# 3.0 1.5 - 6.5  10e3/uL   HGB 10.5 (L) 11.6 - 15.9 g/dL   HCT 31.9 (L) 34.8 - 46.6 %   Platelets 352 145 - 400 10e3/uL   MCV 84.4 79.5 - 101.0 fL   MCH 27.8 25.1 - 34.0 pg   MCHC 32.9 31.5 - 36.0 g/dL   RBC 3.78 3.70 - 5.45 10e6/uL   RDW 15.6 (H) 11.2 - 14.5 %   lymph# 0.9 0.9 - 3.3 10e3/uL   MONO# 0.0 (L) 0.1 - 0.9 10e3/uL   Eosinophils Absolute 0.0 0.0 - 0.5 10e3/uL   Basophils Absolute 0.0 0.0 - 0.1 10e3/uL   NEUT% 77.0 (H) 38.4 - 76.8 %   LYMPH% 21.9 14.0 - 49.7 %   MONO% 0.8 0.0 - 14.0 %   EOS% 0.0 0.0 - 7.0 %   BASO% 0.3 0.0 - 2.0 %   nRBC 0 0 - 0 %  Comprehensive metabolic panel (Cmet) - [facility]  Result Value Ref Range   Sodium 140 136 - 145 mEq/L   Potassium 3.8 3.5 - 5.1 mEq/L   Chloride 105 98 - 109 mEq/L   CO2 23 22 - 29 mEq/L   Glucose 239 (H) 70 - 140 mg/dl   BUN 10.8 7.0 - 26.0 mg/dL   Creatinine 0.7 0.6 - 1.1 mg/dL   Total Bilirubin 0.24 0.20 - 1.20 mg/dL   Alkaline Phosphatase 83 40 - 150 U/L   AST 15 5 - 34 U/L   ALT 10 0 - 55  U/L   Total Protein 7.0 6.4 - 8.3 g/dL   Albumin 3.0 (L) 3.5 - 5.0 g/dL   Calcium 9.1 8.4 - 10.4 mg/dL   Anion Gap 12 (H) 3 - 11 mEq/L   EGFR >90 >90 ml/min/1.73 m2   Potassium up now since off lasix. CA 125 pending, this having been 1162 preop on 08-02-14 (none prior to 08-02-14)  Studies/Results:  No results found.  Medications: I have reviewed the patient's current medications. She will try to use antiemetics just prn beginning a few days after today's chemo, if tolerated.  Diflucan completed.Reminded her about using claritin for taxol/ granix aches, which she can begin today or tomorrow. OK to use prn pain medication still available at home also for granix aches.  She has not taken lasix since suggested she hold this a week ago, continue to hold.  DISCUSSION: I have told patient that I am so pleased that she is clinically already showing some response to just day 1 and day 8 cycle 1 chemo thus far.  FMLA papers will be completed, need to be  faxed back to her employer by 09-16-14.  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 Genetics testing to be set up coordinating with other appointments. Gyn onc following. Hold day 15 cycle 1 taxol today due to leukopenia. I will see her 2-18 with day 1 cycle 2 carbo taxol same day, then neupogen and possibly IVF on 2-19, with Dr Serita Grit appointment also that day. I will see her again 09-14-14 with treatment again same day.If she continues to make progress this quickly, she may prefer to change to q 3 week carbo taxol. 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 Prior records received. Up to date on mammograms. Genetics testing as above 4.Pleural effusions and ascites not more symptomatic today.  5.severe malnutrition in context of chronic illness: as above, [place] nutritionist scheduled to see today, po intake much better  6.constipation resolved: tumor in proximity to rectum. Needs stool softeners and prn laxatives ongoing.  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 BP low in hospital after surgery, responded to IVF, vasotec and lasix resumed prior to DC, now again off lasix 8.GERD, oral candida likely from steroids, possibly esophageal candida: symptoms resolved with diflucan, continuing carafate and protonix 9.post parathyroidectomy 2005 10.hypokalemia resolved off lasix. 11.oral and esophageal candidiasis resolved.  Chemo orders confirmed, also IVF and gCSF orders  for 09-08-14. All questions answered, patient in agreement with plans as above. Time spent 25 min including >50% counseling and coordination of care. Cc this note to Drs Denman George and Eula Fried  Addendum CA 125 available after visit 1182, which is essentially stable from 4 weeks ago.  Jocob Dambach P, MD   09/07/2014, 10:46 AM

## 2014-09-07 NOTE — Patient Instructions (Signed)
[facility] Discharge Instructions for Patients Receiving Chemotherapy  Today you received the following chemotherapy agents: Taxol and Carboplatin.  To help prevent nausea and vomiting after your treatment, we encourage you to take your nausea medication:Compazine 10 mg every 6 hours as needed; Zofran 8 mg every 8  Hours as needed.   If you develop nausea and vomiting that is not controlled by your nausea medication, call the clinic.   BELOW ARE SYMPTOMS THAT SHOULD BE REPORTED IMMEDIATELY:  *FEVER GREATER THAN 100.5 F  *CHILLS WITH OR WITHOUT FEVER  NAUSEA AND VOMITING THAT IS NOT CONTROLLED WITH YOUR NAUSEA MEDICATION  *UNUSUAL SHORTNESS OF BREATH  *UNUSUAL BRUISING OR BLEEDING  TENDERNESS IN MOUTH AND THROAT WITH OR WITHOUT PRESENCE OF ULCERS  *URINARY PROBLEMS  *BOWEL PROBLEMS  UNUSUAL RASH Items with * indicate a potential emergency and should be followed up as soon as possible.  Feel free to call the clinic you have any questions or concerns. The clinic phone number is [phone].

## 2014-09-07 NOTE — Telephone Encounter (Signed)
Faxed completed FMLA form to [employer].  Gave a copy to Ms. Zigmund Daniel and sent one to HIM to be scanned into patient's EMR.

## 2014-09-08 ENCOUNTER — Encounter: Payer: 59 | Admitting: Nutrition

## 2014-09-08 ENCOUNTER — Ambulatory Visit: Payer: 59 | Admitting: Nutrition

## 2014-09-08 ENCOUNTER — Ambulatory Visit (HOSPITAL_BASED_OUTPATIENT_CLINIC_OR_DEPARTMENT_OTHER): Payer: 59

## 2014-09-08 ENCOUNTER — Encounter: Payer: Self-pay | Admitting: Gynecologic Oncology

## 2014-09-08 ENCOUNTER — Ambulatory Visit: Payer: 59

## 2014-09-08 ENCOUNTER — Ambulatory Visit: Payer: 59 | Admitting: Gynecologic Oncology

## 2014-09-08 ENCOUNTER — Ambulatory Visit: Payer: 59 | Attending: Gynecologic Oncology | Admitting: Gynecologic Oncology

## 2014-09-08 DIAGNOSIS — E876 Hypokalemia: Secondary | ICD-10-CM | POA: Insufficient documentation

## 2014-09-08 DIAGNOSIS — C801 Malignant (primary) neoplasm, unspecified: Secondary | ICD-10-CM

## 2014-09-08 DIAGNOSIS — C569 Malignant neoplasm of unspecified ovary: Secondary | ICD-10-CM | POA: Insufficient documentation

## 2014-09-08 DIAGNOSIS — Z483 Aftercare following surgery for neoplasm: Secondary | ICD-10-CM

## 2014-09-08 DIAGNOSIS — B370 Candidal stomatitis: Secondary | ICD-10-CM | POA: Insufficient documentation

## 2014-09-08 DIAGNOSIS — T502X5A Adverse effect of carbonic-anhydrase inhibitors, benzothiadiazides and other diuretics, initial encounter: Secondary | ICD-10-CM | POA: Insufficient documentation

## 2014-09-08 DIAGNOSIS — Z5189 Encounter for other specified aftercare: Secondary | ICD-10-CM

## 2014-09-08 LAB — CA 125(PREVIOUS METHOD): CA 125: 889.7 U/mL — ABNORMAL HIGH (ref 0.0–30.2)

## 2014-09-08 LAB — CA 125: CA 125: 1182 U/mL — ABNORMAL HIGH (ref <35)

## 2014-09-08 MED ORDER — FILGRASTIM 300 MCG/0.5ML IJ SOSY: 300.0000 ug | PREFILLED_SYRINGE | Freq: Once | INTRAMUSCULAR | Status: DC

## 2014-09-08 MED ORDER — SODIUM CHLORIDE 0.9 % IV SOLN
Freq: Once | INTRAVENOUS | Status: AC
  Administered 2014-09-08: 12:00:00 via INTRAVENOUS

## 2014-09-08 MED ORDER — DEXAMETHASONE SODIUM PHOSPHATE 10 MG/ML IJ SOLN
4.0000 mg | Freq: Once | INTRAMUSCULAR | Status: AC
  Administered 2014-09-08: 4 mg via INTRAVENOUS

## 2014-09-08 MED ORDER — TBO-FILGRASTIM 300 MCG/0.5ML ~~LOC~~ SOSY
300.0000 ug | PREFILLED_SYRINGE | Freq: Once | SUBCUTANEOUS | Status: AC
  Administered 2014-09-08: 300 ug via SUBCUTANEOUS
  Filled 2014-09-08: qty 0.5

## 2014-09-08 MED ORDER — DEXAMETHASONE SODIUM PHOSPHATE 10 MG/ML IJ SOLN
INTRAMUSCULAR | Status: AC
  Filled 2014-09-08: qty 1

## 2014-09-08 MED ORDER — ONDANSETRON 8 MG/50ML IVPB (CHCC)
8.0000 mg | Freq: Once | INTRAVENOUS | Status: AC | PRN
  Administered 2014-09-08: 8 mg via INTRAVENOUS

## 2014-09-08 MED ORDER — ONDANSETRON 8 MG/NS 50 ML IVPB
INTRAVENOUS | Status: AC
  Filled 2014-09-08: qty 8

## 2014-09-08 NOTE — Progress Notes (Signed)
POSTOPERATIVE FOLLOWUP VISIT  HPI:  Kathleen Bond is a 64 y.o. year old G27 initially seen in consultation on 07/25/14 for stage IIIC ovarian cancer.  She then underwent an exploratory laparotomy and omental biopsy on 08/15/14. It was complicated by inability to complete a debulking procedure due to extensive peritoneal involvement of small and large intestine and complete peritoneal involvement with tumor.  Her postoperative course was uncomplicated and she was started on cycle 1 of paclitaxel and carboplatin on 08/18/14.  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  She is seen today for a postoperative check and to discuss her pathology results and ongoing plan.  Since discharge from the hospital, she is feeling much better.  She has improving appetite, is eating much better and is much more comfortable. She has normal bowel and bladder function, and pain controlled with minimal PO medication. She has no other complaints today.    Review of systems: Constitutional:  She has no weight gain or weight loss. She has no fever or chills. Eyes: No blurred vision Ears, Nose, Mouth, Throat: No dizziness, headaches or changes in hearing. No mouth sores. Cardiovascular: No chest pain, palpitations or edema. Respiratory:  No shortness of breath, wheezing or cough Gastrointestinal: She has normal bowel movements without diarrhea or constipation. She denies any nausea or vomiting. She denies blood in her stool or heart burn. Genitourinary:  She denies pelvic pain, pelvic pressure or changes in her urinary function. She has no hematuria, dysuria, or incontinence. She has no irregular vaginal bleeding or vaginal discharge Musculoskeletal: Denies muscle weakness or joint pains.  Skin:  She has no skin changes, rashes or itching Neurological:  Denies dizziness or headaches. No neuropathy, no numbness or tingling. Psychiatric:  She denies depression or anxiety. Hematologic/Lymphatic:   No  easy bruising or bleeding   Physical Exam: There were no vitals taken for this visit. General: Well dressed, well nourished in no apparent distress.   HEENT:  Normocephalic and atraumatic, no lesions.  Extraocular muscles intact. Sclerae anicteric. Pupils equal, round, reactive. No mouth sores or ulcers. Thyroid is normal size, not nodular, midline. Skin:  No lesions or rashes. Breasts:  Soft, symmetric.  No skin or nipple changes.  No palpable LN or masses. Lungs:  Clear to auscultation bilaterally.  No wheezes. Cardiovascular:  Regular rate and rhythm.  No murmurs or rubs. Abdomen:  Soft, nontender, nondistended.  No palpable masses.  No hepatosplenomegaly.  No ascites. Normal bowel sounds.  No hernias.  Incision is healing well with one small 1cm area of scab in the central incision. No infection. Genitourinary: Normal EGBUS  Vaginal cuff intact.  No bleeding or discharge.  No cul de sac fullness. Extremities: No cyanosis, clubbing or edema.  No calf tenderness or erythema. No palpable cords. Psychiatric: Mood and affect are appropriate. Neurological: Awake, alert and oriented x 3. Sensation is intact, no neuropathy.  Musculoskeletal: No pain, normal strength and range of motion.  Assessment:    64 y.o. 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   S/p exploratory laparotomy and omental biopsy on 08/15/14.   Plan: 1) Pathology reports reviewed today 2) Treatment counseling - I reviewed our plan for 3-4 cycles of neoadjuvant chemotherapy followed by imaging and CA 125 assessment and reconsideration of surgery and debulking at that time. She was given the opportunity to ask questions, which were answered to her satisfaction, and she is agreement with the above mentioned plan of care.  3)  Return to clinic after cycle 3/4.  [name], MD

## 2014-09-08 NOTE — Progress Notes (Signed)
63 year old female diagnosed with ovarian cancer.  She is a patient of Dr. Marko Plume.  Past medical history includes hypertension and hypothyroidism.  Medications include Decadron, Colace, Lasix, Synthroid, Zofran, Protonix, Compazine, and Carafate.  Labs were reviewed.  Height: 62 inches. Weight: 119.3 pounds February 18. Usual body weight: 140 pounds.  Patient weighed 129 pounds January 2016. BMI: 21.81.  Patient reports malignant ascites has resolved. She does have nausea but this is improved on her nausea medication.  Patient denies vomiting. Patient denies constipation but did experience loose stools last night after chemotherapy. Patient tried Ensure but did not like it.  She is willing to try other oral nutrition supplements. Patient reports she was unable to eat for approximately 3 months.  Nutrition diagnosis: Unintended weight loss related to new diagnosis of ovarian cancer and associated treatments as evidenced by 8% weight loss over one month.  Patient meets criteria for severe malnutrition in the context of chronic illness secondary to 8% weight loss in one month and less than 75% energy intake greater than one month.  Intervention: Patient educated to increase calories and protein in small frequent meals and snacks. Recommended patient try a variety of other oral nutrition supplements and provided samples for her to try boost and juice-based supplements. Educated patient on strategies for eating if she develops nausea and vomiting. Fact sheets were provided.  Questions were answered.  Teach back method used.  Contact information given.  Monitoring, evaluation, goals: Patient will work to increase calories and protein to minimize weight loss.  Next visit: Thursday, February 25, during chemotherapy.  **Disclaimer: This note was dictated with voice recognition software. Similar sounding words can inadvertently be transcribed and this note may contain transcription errors  which may not have been corrected upon publication of note.**

## 2014-09-08 NOTE — Patient Instructions (Signed)
Dehydration, Adult Dehydration is when you lose more fluids from the body than you take in. Vital organs like the kidneys, brain, and heart cannot function without a proper amount of fluids and salt. Any loss of fluids from the body can cause dehydration.  CAUSES   Vomiting.  Diarrhea.  Excessive sweating.  Excessive urine output.  Fever. SYMPTOMS  Mild dehydration  Thirst.  Dry lips.  Slightly dry mouth. Moderate dehydration  Very dry mouth.  Sunken eyes.  Skin does not bounce back quickly when lightly pinched and released.  Dark urine and decreased urine production.  Decreased tear production.  Headache. Severe dehydration  Very dry mouth.  Extreme thirst.  Rapid, weak pulse (more than 100 beats per minute at rest).  Cold hands and feet.  Not able to sweat in spite of heat and temperature.  Rapid breathing.  Blue lips.  Confusion and lethargy.  Difficulty being awakened.  Minimal urine production.  No tears. DIAGNOSIS  Your caregiver will diagnose dehydration based on your symptoms and your exam. Blood and urine tests will help confirm the diagnosis. The diagnostic evaluation should also identify the cause of dehydration. TREATMENT  Treatment of mild or moderate dehydration can often be done at home by increasing the amount of fluids that you drink. It is best to drink small amounts of fluid more often. Drinking too much at one time can make vomiting worse. Refer to the home care instructions below. Severe dehydration needs to be treated at the hospital where you will probably be given intravenous (IV) fluids that contain water and electrolytes. HOME CARE INSTRUCTIONS   Ask your caregiver about specific rehydration instructions.  Drink enough fluids to keep your urine clear or pale yellow.  Drink small amounts frequently if you have nausea and vomiting.  Eat as you normally do.  Avoid:  Foods or drinks high in sugar.  Carbonated  drinks.  Juice.  Extremely hot or cold fluids.  Drinks with caffeine.  Fatty, greasy foods.  Alcohol.  Tobacco.  Overeating.  Gelatin desserts.  Wash your hands well to avoid spreading bacteria and viruses.  Only take over-the-counter or prescription medicines for pain, discomfort, or fever as directed by your caregiver.  Ask your caregiver if you should continue all prescribed and over-the-counter medicines.  Keep all follow-up appointments with your caregiver. SEEK MEDICAL CARE IF:  You have abdominal pain and it increases or stays in one area (localizes).  You have a rash, stiff neck, or severe headache.  You are irritable, sleepy, or difficult to awaken.  You are weak, dizzy, or extremely thirsty. SEEK IMMEDIATE MEDICAL CARE IF:   You are unable to keep fluids down or you get worse despite treatment.  You have frequent episodes of vomiting or diarrhea.  You have blood or green matter (bile) in your vomit.  You have blood in your stool or your stool looks black and tarry.  You have not urinated in 6 to 8 hours, or you have only urinated a small amount of very dark urine.  You have a fever.  You faint. MAKE SURE YOU:   Understand these instructions.  Will watch your condition.  Will get help right away if you are not doing well or get worse. Document Released: 07/07/2005 Document Revised: 09/29/2011 Document Reviewed: 02/24/2011 ExitCare Patient Information 2015 [employer]. This information is not intended to replace advice given to you by your health care provider. Make sure you discuss any questions you have with your health care   provider.  

## 2014-09-10 ENCOUNTER — Other Ambulatory Visit: Payer: Self-pay | Admitting: Oncology

## 2014-09-11 ENCOUNTER — Ambulatory Visit: Payer: 59 | Admitting: Oncology

## 2014-09-11 ENCOUNTER — Other Ambulatory Visit: Payer: 59

## 2014-09-11 ENCOUNTER — Ambulatory Visit: Payer: 59

## 2014-09-13 ENCOUNTER — Ambulatory Visit: Payer: 59 | Admitting: Gynecologic Oncology

## 2014-09-14 ENCOUNTER — Other Ambulatory Visit (HOSPITAL_BASED_OUTPATIENT_CLINIC_OR_DEPARTMENT_OTHER): Payer: 59

## 2014-09-14 ENCOUNTER — Encounter: Payer: Self-pay | Admitting: Oncology

## 2014-09-14 ENCOUNTER — Encounter: Payer: Self-pay | Admitting: *Deleted

## 2014-09-14 ENCOUNTER — Ambulatory Visit (HOSPITAL_BASED_OUTPATIENT_CLINIC_OR_DEPARTMENT_OTHER): Payer: 59

## 2014-09-14 ENCOUNTER — Telehealth: Payer: Self-pay | Admitting: Oncology

## 2014-09-14 ENCOUNTER — Ambulatory Visit: Payer: 59 | Admitting: Nutrition

## 2014-09-14 ENCOUNTER — Ambulatory Visit (HOSPITAL_BASED_OUTPATIENT_CLINIC_OR_DEPARTMENT_OTHER): Payer: 59 | Admitting: Oncology

## 2014-09-14 VITALS — BP 132/82 | HR 103 | Temp 98.6°F | Resp 18 | Ht 62.0 in | Wt 113.2 lb

## 2014-09-14 DIAGNOSIS — E039 Hypothyroidism, unspecified: Secondary | ICD-10-CM

## 2014-09-14 DIAGNOSIS — C569 Malignant neoplasm of unspecified ovary: Secondary | ICD-10-CM

## 2014-09-14 DIAGNOSIS — Z853 Personal history of malignant neoplasm of breast: Secondary | ICD-10-CM

## 2014-09-14 DIAGNOSIS — D72819 Decreased white blood cell count, unspecified: Secondary | ICD-10-CM

## 2014-09-14 DIAGNOSIS — C801 Malignant (primary) neoplasm, unspecified: Secondary | ICD-10-CM

## 2014-09-14 DIAGNOSIS — R180 Malignant ascites: Secondary | ICD-10-CM

## 2014-09-14 DIAGNOSIS — D701 Agranulocytosis secondary to cancer chemotherapy: Secondary | ICD-10-CM

## 2014-09-14 DIAGNOSIS — Z5111 Encounter for antineoplastic chemotherapy: Secondary | ICD-10-CM

## 2014-09-14 DIAGNOSIS — T451X5A Adverse effect of antineoplastic and immunosuppressive drugs, initial encounter: Secondary | ICD-10-CM

## 2014-09-14 DIAGNOSIS — J90 Pleural effusion, not elsewhere classified: Secondary | ICD-10-CM

## 2014-09-14 DIAGNOSIS — R634 Abnormal weight loss: Secondary | ICD-10-CM

## 2014-09-14 DIAGNOSIS — E43 Unspecified severe protein-calorie malnutrition: Secondary | ICD-10-CM

## 2014-09-14 LAB — CBC WITH DIFFERENTIAL/PLATELET
BASO%: 0.3 % (ref 0.0–2.0)
BASOS ABS: 0 10*3/uL (ref 0.0–0.1)
EOS%: 0.2 % (ref 0.0–7.0)
Eosinophils Absolute: 0 10*3/uL (ref 0.0–0.5)
HEMATOCRIT: 33.9 % — AB (ref 34.8–46.6)
HEMOGLOBIN: 10.9 g/dL — AB (ref 11.6–15.9)
LYMPH%: 17.8 % (ref 14.0–49.7)
MCH: 27.5 pg (ref 25.1–34.0)
MCHC: 32.2 g/dL (ref 31.5–36.0)
MCV: 85.6 fL (ref 79.5–101.0)
MONO#: 0.3 10*3/uL (ref 0.1–0.9)
MONO%: 4.9 % (ref 0.0–14.0)
NEUT#: 4.6 10*3/uL (ref 1.5–6.5)
NEUT%: 76.8 % (ref 38.4–76.8)
PLATELETS: 330 10*3/uL (ref 145–400)
RBC: 3.96 10*6/uL (ref 3.70–5.45)
RDW: 16.4 % — AB (ref 11.2–14.5)
WBC: 6 10*3/uL (ref 3.9–10.3)
lymph#: 1.1 10*3/uL (ref 0.9–3.3)

## 2014-09-14 LAB — COMPREHENSIVE METABOLIC PANEL (CC13)
ALK PHOS: 75 U/L (ref 40–150)
ALT: 12 U/L (ref 0–55)
AST: 17 U/L (ref 5–34)
Albumin: 3.4 g/dL — ABNORMAL LOW (ref 3.5–5.0)
Anion Gap: 12 mEq/L — ABNORMAL HIGH (ref 3–11)
BUN: 14.9 mg/dL (ref 7.0–26.0)
CO2: 27 mEq/L (ref 22–29)
Calcium: 9.5 mg/dL (ref 8.4–10.4)
Chloride: 102 mEq/L (ref 98–109)
Creatinine: 0.7 mg/dL (ref 0.6–1.1)
Glucose: 101 mg/dl (ref 70–140)
Potassium: 4.3 mEq/L (ref 3.5–5.1)
Sodium: 140 mEq/L (ref 136–145)
TOTAL PROTEIN: 7.3 g/dL (ref 6.4–8.3)
Total Bilirubin: 0.25 mg/dL (ref 0.20–1.20)

## 2014-09-14 MED ORDER — FAMOTIDINE IN NACL 20-0.9 MG/50ML-% IV SOLN
INTRAVENOUS | Status: AC
  Filled 2014-09-14: qty 50

## 2014-09-14 MED ORDER — DIPHENHYDRAMINE HCL 50 MG/ML IJ SOLN
25.0000 mg | Freq: Once | INTRAMUSCULAR | Status: AC
  Administered 2014-09-14: 25 mg via INTRAVENOUS

## 2014-09-14 MED ORDER — DEXAMETHASONE SODIUM PHOSPHATE 20 MG/5ML IJ SOLN
20.0000 mg | Freq: Once | INTRAMUSCULAR | Status: AC
  Administered 2014-09-14: 20 mg via INTRAVENOUS

## 2014-09-14 MED ORDER — DIPHENHYDRAMINE HCL 50 MG/ML IJ SOLN
INTRAMUSCULAR | Status: AC
  Filled 2014-09-14: qty 1

## 2014-09-14 MED ORDER — ONDANSETRON 8 MG/NS 50 ML IVPB
INTRAVENOUS | Status: AC
  Filled 2014-09-14: qty 8

## 2014-09-14 MED ORDER — ONDANSETRON 8 MG/50ML IVPB (CHCC)
8.0000 mg | Freq: Once | INTRAVENOUS | Status: AC
  Administered 2014-09-14: 8 mg via INTRAVENOUS

## 2014-09-14 MED ORDER — SODIUM CHLORIDE 0.9 % IV SOLN
Freq: Once | INTRAVENOUS | Status: AC
  Administered 2014-09-14: 15:00:00 via INTRAVENOUS

## 2014-09-14 MED ORDER — FAMOTIDINE IN NACL 20-0.9 MG/50ML-% IV SOLN
20.0000 mg | Freq: Once | INTRAVENOUS | Status: AC
  Administered 2014-09-14: 20 mg via INTRAVENOUS

## 2014-09-14 MED ORDER — DEXAMETHASONE SODIUM PHOSPHATE 20 MG/5ML IJ SOLN
INTRAMUSCULAR | Status: AC
  Filled 2014-09-14: qty 5

## 2014-09-14 MED ORDER — PACLITAXEL CHEMO INJECTION 300 MG/50ML
80.0000 mg/m2 | Freq: Once | INTRAVENOUS | Status: AC
  Administered 2014-09-14: 126 mg via INTRAVENOUS
  Filled 2014-09-14: qty 21

## 2014-09-14 NOTE — Progress Notes (Signed)
Patient brought papers for continued leave of absence from work. Necessary documentation, MD visit notes, and a letter from Dr. Marko Plume (dated 09/14/14) placed in a sealed envelope and brought to patient in infusion area to mail to correct address.

## 2014-09-14 NOTE — Patient Instructions (Signed)
[facility] Discharge Instructions for Patients Receiving Chemotherapy  Today you received the following chemotherapy agents: Taxol.  To help prevent nausea and vomiting after your treatment, we encourage you to take your nausea medication as prescribed.   If you develop nausea and vomiting that is not controlled by your nausea medication, call the clinic.   BELOW ARE SYMPTOMS THAT SHOULD BE REPORTED IMMEDIATELY:  *FEVER GREATER THAN 100.5 F  *CHILLS WITH OR WITHOUT FEVER  NAUSEA AND VOMITING THAT IS NOT CONTROLLED WITH YOUR NAUSEA MEDICATION  *UNUSUAL SHORTNESS OF BREATH  *UNUSUAL BRUISING OR BLEEDING  TENDERNESS IN MOUTH AND THROAT WITH OR WITHOUT PRESENCE OF ULCERS  *URINARY PROBLEMS  *BOWEL PROBLEMS  UNUSUAL RASH Items with * indicate a potential emergency and should be followed up as soon as possible.  Feel free to call the clinic you have any questions or concerns. The clinic phone number is [phone].    

## 2014-09-14 NOTE — Telephone Encounter (Signed)
gave pt avs and appts for feb/march. no date specified on 2/25 pof when pt is to return for f/u, however pt does currently have other f/u appts on schedule. per LL she has sent to VP to open more clinic dates and will give pt additional f/u info on 3/3.

## 2014-09-14 NOTE — Progress Notes (Signed)
Nutrition follow-up completed with patient during chemotherapy. Patient reports she feels well. Reports she does not have nausea, vomiting or diarrhea. She is eating often throughout the day and even throughout the night. Reports recent weight loss result of fluid shifts. Weight documented as 113.2 pounds February 25, down from 119.3 pounds February 18.  Nutrition diagnosis: Unintended weight loss continues.  Intervention: Encouraged patient to continue strategies for increased oral intake. Encouraged medication as prescribed. Teach back method used.  Monitoring, evaluation, goals: Patient will work to increase oral intake to minimize further weight loss.  Next visit: Thursday, March 3, during chemotherapy.  **Disclaimer: This note was dictated with voice recognition software. Similar sounding words can inadvertently be transcribed and this note may contain transcription errors which may not have been corrected upon publication of note.**

## 2014-09-14 NOTE — Progress Notes (Signed)
OFFICE PROGRESS NOTE   September 14, 2014   Physicians:K.Karb, M.Manning), E.Rossi, Cleda Mccreedy (PCP), [name]  INTERVAL HISTORY:  Patient is seen, alone for visit, in continuing attention to chemotherapy being used as initial intervention for advanced serous gyn carcinoma, due day 8 cycle 2 dose dense carbo taxol today. She tolerated day 1 cycle 2 on 09-07-14 without significant difficulty, did have IVF with granix 300 mg on 09-08-14, but probably will not need the additional IVF from here. Plan is to reimage and follow up with Dr Denman George after 3-4 cycles, for possible interval debulking.  Patient had one episode of nausea with diarrhea 09-07-14 after chemo, however stools have been normal since then and she has had no nausea. Appetite is much better, including ice cream at night and grilled cheese + egg this AM. She continues to lose weight, usual ~ 135 lbs, and it is not clear if this is still improvement in disease or possibly excessive thyroid medication, as synthroid was increased from 125 to 150 mcg ~ Nov 2015 before gyn cancer diagnosis. Next scheduled appointment with Dr Eula Fried is in 10-2014; we will recheck TFTs with labs next week here. She has had no increase in abdominal distension, feels that abdomen is ~ back to her usual size,  and denies pain. She is up most of day at home. She denies peripheral neuropathy. She did not have aches from the granix.  PAC placed by IR 08-17-14 No flu vaccine No genetics testing  ONCOLOGIC HISTORY Gyn cancer: Patient became symptomatic with abdominal swelling and constipation in late Nov 2015, seen and treated initially at [facility] ED and by PCP Dr Cleda Mccreedy for what was thought to be ulcer. Symptoms continued, with early satiety causing poor po intake; she was seen again at [facility] ED in early Jan 2016, with CT reportedly consistent with stage IIIC or IV gyn cancer, including omental caking, large pelvic mass, para-aortic adenopathy,  ascites, pleural effusions and right subpleural nodule. She was seen in consultation by Dr Denman George on 07-25-14, with concern from her exam and scan for rectal involvement. 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 She had paracentesis of 3.4 liters ascites on 07-27-2014, with cytology ([id number]) showing serous carcinoma consistent with gyn primary. She had right thoracentesis for 350 cc on 08-11-14, this requested by anesthesia prior to surgery and apparently not sent for cytology. Patient was more comfortable after paracentesis, but could not tell any difference in breathing after thoracentesis (denies SOB prior to that procedure). CA 125 on 08-02-14 was 1162. She was taken to exploratory laparotomy by Dr Denman George on 08-15-14, with 4 liters of ascites removed and biopsy of omentum obtained, with additional debulking not attempted due to surgical findings of entire peritoneal surface replaced by tumor plaque, small intestine coated with tumor and mesentery tethered with tumor nodules, ileum adherent to pelvic mass, dense plaque of tumor on right diaphragm and 10 cm omental cake in LUQ. Pathology of omental biopsy She needed another paracentesis by IR for 1.3 liters ascites on 08-17-14, and had PAC placed by IR also 08-17-14. Day 1 cycle 1 dose dense carboplatin taxol was given in hospital on 08-18-14.   Breast cancer: Left breast biopsy 11-22-2003 ([id number]) invasive carcinoma. Left breast wide excision with sentinel nodes and axillary contents by Dr Benson Norway 01-03-2004 [phone]) poorly differentiated invasive ductal carcinoma 3.0 cm with closest margin 1.7 cm deep, 6 sentinel nodes + 3 additional nodes all  negative, ER 0, PR 0, HER 2 3+, Ki67 of 19%. . She received adjuvant chemotherapy by Dr Sonny Dandy with 5FU, epirubicin, cytoxan x 4 cycles from 02-13-2004 thru 04-16-2004, followed by taxol 80 mg/m2 weekly x 11 from 05-07-2004 thru 09-04-2004, and  herceptin 05-21-2004 thru 02-25-2005. Antiemetics were Kytril and decadron. She had PAC for the chemotherapy. She had radiation by Dr Tyler Pita in [place]. Last note from Dr Sonny Dandy in this information was 04-2008, tho year follow up was recommended then.  Most recent mammograms were in [place] 02-2014, reportedly not remarkable.  Review of systems as above, also: No SOB or cough. No problems with PAC. No LE swelling now. No bleeding.Remainder of 10 point Review of Systems negative.  Objective:  Vital signs in last 24 hours:  BP 132/82 mmHg  Pulse 103  Temp(Src) 98.6 F (37 C) (Oral)  Resp 18  Ht 5' 2"  (1.575 m)  Wt 113 lb 3.2 oz (51.347 kg)  BMI 20.70 kg/m2  SpO2 100% Weight is down 4 lbs from 09-07-14. Alert, oriented and appropriate. Ambulatory without difficulty, looks comfortable and more energetic (did take premed decadron for taxol today). Hair is thinning, no complete alopecia  HEENT:PERRL, sclerae not icteric. Oral mucosa moist without lesions, posterior pharynx clear.  Neck supple. No JVD.  Lymphatics:no cervical,supraclavicular, axillary or inguinal adenopathy Resp: clear to auscultation to lower fields bilaterally and normal percussion bilaterally Cardio: regular rate and rhythm. No gallop. GI: softer thruout, nontender, less distended. Normally active bowel sounds. Surgical incision closed, not tender. Musculoskeletal/ Extremities: without pitting edema, cords, tenderness Neuro: no peripheral neuropathy. Otherwise nonfocal. PSYCH appropriate mood and affect Skin without rash, ecchymosis, petechiae Breasts: bilaterally without dominant mass, skin or nipple findings. Left surgical scar not remarkable. Axillae benign. Portacath-without erythema or tenderness  Lab Results:  Results for orders placed or performed in visit on 09/14/14  CBC with Differential  Result Value Ref Range   WBC 6.0 3.9 - 10.3 10e3/uL   NEUT# 4.6 1.5 - 6.5 10e3/uL   HGB 10.9 (L) 11.6 - 15.9 g/dL    HCT 33.9 (L) 34.8 - 46.6 %   Platelets 330 145 - 400 10e3/uL   MCV 85.6 79.5 - 101.0 fL   MCH 27.5 25.1 - 34.0 pg   MCHC 32.2 31.5 - 36.0 g/dL   RBC 3.96 3.70 - 5.45 10e6/uL   RDW 16.4 (H) 11.2 - 14.5 %   lymph# 1.1 0.9 - 3.3 10e3/uL   MONO# 0.3 0.1 - 0.9 10e3/uL   Eosinophils Absolute 0.0 0.0 - 0.5 10e3/uL   Basophils Absolute 0.0 0.0 - 0.1 10e3/uL   NEUT% 76.8 38.4 - 76.8 %   LYMPH% 17.8 14.0 - 49.7 %   MONO% 4.9 0.0 - 14.0 %   EOS% 0.2 0.0 - 7.0 %   BASO% 0.3 0.0 - 2.0 %  Comprehensive metabolic panel (Cmet) - [facility]  Result Value Ref Range   Sodium 140 136 - 145 mEq/L   Potassium 4.3 3.5 - 5.1 mEq/L   Chloride 102 98 - 109 mEq/L   CO2 27 22 - 29 mEq/L   Glucose 101 70 - 140 mg/dl   BUN 14.9 7.0 - 26.0 mg/dL   Creatinine 0.7 0.6 - 1.1 mg/dL   Total Bilirubin 0.25 0.20 - 1.20 mg/dL   Alkaline Phosphatase 75 40 - 150 U/L   AST 17 5 - 34 U/L   ALT 12 0 - 55 U/L   Total Protein 7.3 6.4 - 8.3 g/dL   Albumin  3.4 (L) 3.5 - 5.0 g/dL   Calcium 9.5 8.4 - 10.4 mg/dL   Anion Gap 12 (H) 3 - 11 mEq/L   EGFR >90 >90 ml/min/1.73 m2    CA 125 on 09-07-14 was 1182, this having been 1162 on 08-02-14 which was first value available.  Studies/Results:  No results found.  Medications: I have reviewed the patient's current medications.   DISCUSSION: ongoing weight loss discussed as above, not related to inadequate po intake now. Will check thyroid tests with next labs done as unable to add these today; will ask Dr Eula Fried to assist if concerns with those. Otherwise is clinically improving quickly and is glad to continue treatment including granix days 08-29-14. We may consider changing to q 3 week carbo taxol regimen if she prefers, but will complete present cycle at least as dose dense.  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 Granix day after each treatment since needed to hold day 15 cycle 1 for leukopenia. No extra IVF day after Rx needed now. I will see her next week with day 15 cycle 2, and will discuss change in regimen to q 3 weeks then. Referral to genetics made again as this is not yet in EMR. 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 Prior records received. Up to date on mammograms. Genetics testing as above 4.Pleural effusions and ascites clinically improved. 5.severe malnutrition in context of chronic illness: as above, [place] nutritionist following, po intake much better. Not clear how much weight she had really lost in several months prior to diagnosis due to ascites etc.  6.constipation resolved: tumor in proximity to rectum. Needs stool softeners and prn laxatives ongoing.  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 BP low in hospital after surgery, responded to IVF, vasotec and lasix resumed prior to DC, now again off lasix 8.GERD, oral candida likely from steroids, possibly esophageal candida: symptoms resolved with diflucan 9.post parathyroidectomy 2005 10.hypokalemia resolved off lasix, which she does not need to resume 11.hypothyroidism on replacement: possibly related to ongoing weight loss as this dose was increased ~ Nov when she was initially symptomatic from what was actually this malignancy. Plan as above.   Chemo and granix orders confirmed. Patient follows discussion well and is in agreement with recommendations and plans.  Time spent 30 min including >50% counseling and coordination of care.    Finch Costanzo P, MD    09/14/2014, 1:50 PM

## 2014-09-15 ENCOUNTER — Ambulatory Visit (HOSPITAL_BASED_OUTPATIENT_CLINIC_OR_DEPARTMENT_OTHER): Payer: 59

## 2014-09-15 DIAGNOSIS — Z5189 Encounter for other specified aftercare: Secondary | ICD-10-CM

## 2014-09-15 DIAGNOSIS — C801 Malignant (primary) neoplasm, unspecified: Secondary | ICD-10-CM

## 2014-09-15 DIAGNOSIS — C569 Malignant neoplasm of unspecified ovary: Secondary | ICD-10-CM

## 2014-09-15 MED ORDER — TBO-FILGRASTIM 300 MCG/0.5ML ~~LOC~~ SOSY
300.0000 ug | PREFILLED_SYRINGE | Freq: Once | SUBCUTANEOUS | Status: AC
  Administered 2014-09-15: 300 ug via SUBCUTANEOUS
  Filled 2014-09-15: qty 0.5

## 2014-09-15 MED ORDER — FILGRASTIM 300 MCG/0.5ML IJ SOSY: 300.0000 ug | PREFILLED_SYRINGE | Freq: Once | INTRAMUSCULAR | Status: DC

## 2014-09-17 ENCOUNTER — Other Ambulatory Visit: Payer: Self-pay | Admitting: Oncology

## 2014-09-18 ENCOUNTER — Ambulatory Visit: Payer: 59

## 2014-09-21 ENCOUNTER — Ambulatory Visit (HOSPITAL_BASED_OUTPATIENT_CLINIC_OR_DEPARTMENT_OTHER): Payer: 59 | Admitting: Oncology

## 2014-09-21 ENCOUNTER — Ambulatory Visit (HOSPITAL_BASED_OUTPATIENT_CLINIC_OR_DEPARTMENT_OTHER): Payer: 59

## 2014-09-21 ENCOUNTER — Other Ambulatory Visit (HOSPITAL_BASED_OUTPATIENT_CLINIC_OR_DEPARTMENT_OTHER): Payer: 59

## 2014-09-21 ENCOUNTER — Encounter: Payer: Self-pay | Admitting: Oncology

## 2014-09-21 ENCOUNTER — Ambulatory Visit: Payer: 59 | Admitting: Nutrition

## 2014-09-21 VITALS — BP 135/73 | HR 87 | Temp 98.4°F | Resp 18 | Ht 62.0 in | Wt 110.6 lb

## 2014-09-21 DIAGNOSIS — E039 Hypothyroidism, unspecified: Secondary | ICD-10-CM

## 2014-09-21 DIAGNOSIS — R188 Other ascites: Secondary | ICD-10-CM

## 2014-09-21 DIAGNOSIS — C801 Malignant (primary) neoplasm, unspecified: Secondary | ICD-10-CM

## 2014-09-21 DIAGNOSIS — T451X5A Adverse effect of antineoplastic and immunosuppressive drugs, initial encounter: Secondary | ICD-10-CM

## 2014-09-21 DIAGNOSIS — C569 Malignant neoplasm of unspecified ovary: Secondary | ICD-10-CM

## 2014-09-21 DIAGNOSIS — D649 Anemia, unspecified: Secondary | ICD-10-CM

## 2014-09-21 DIAGNOSIS — D509 Iron deficiency anemia, unspecified: Secondary | ICD-10-CM

## 2014-09-21 DIAGNOSIS — D701 Agranulocytosis secondary to cancer chemotherapy: Secondary | ICD-10-CM

## 2014-09-21 DIAGNOSIS — Z95828 Presence of other vascular implants and grafts: Secondary | ICD-10-CM

## 2014-09-21 DIAGNOSIS — K219 Gastro-esophageal reflux disease without esophagitis: Secondary | ICD-10-CM

## 2014-09-21 DIAGNOSIS — Z5111 Encounter for antineoplastic chemotherapy: Secondary | ICD-10-CM

## 2014-09-21 DIAGNOSIS — Z853 Personal history of malignant neoplasm of breast: Secondary | ICD-10-CM

## 2014-09-21 DIAGNOSIS — R634 Abnormal weight loss: Secondary | ICD-10-CM

## 2014-09-21 DIAGNOSIS — R180 Malignant ascites: Secondary | ICD-10-CM

## 2014-09-21 DIAGNOSIS — J90 Pleural effusion, not elsewhere classified: Secondary | ICD-10-CM

## 2014-09-21 LAB — CBC WITH DIFFERENTIAL/PLATELET
BASO%: 0 % (ref 0.0–2.0)
Basophils Absolute: 0 10*3/uL (ref 0.0–0.1)
EOS%: 0 % (ref 0.0–7.0)
Eosinophils Absolute: 0 10*3/uL (ref 0.0–0.5)
HCT: 32.6 % — ABNORMAL LOW (ref 34.8–46.6)
HGB: 10.5 g/dL — ABNORMAL LOW (ref 11.6–15.9)
LYMPH%: 20 % (ref 14.0–49.7)
MCH: 27.5 pg (ref 25.1–34.0)
MCHC: 32.2 g/dL (ref 31.5–36.0)
MCV: 85.3 fL (ref 79.5–101.0)
MONO#: 0.2 10*3/uL (ref 0.1–0.9)
MONO%: 4.8 % (ref 0.0–14.0)
NEUT#: 3.3 10*3/uL (ref 1.5–6.5)
NEUT%: 75.2 % (ref 38.4–76.8)
PLATELETS: 289 10*3/uL (ref 145–400)
RBC: 3.82 10*6/uL (ref 3.70–5.45)
RDW: 17 % — ABNORMAL HIGH (ref 11.2–14.5)
WBC: 4.4 10*3/uL (ref 3.9–10.3)
lymph#: 0.9 10*3/uL (ref 0.9–3.3)

## 2014-09-21 LAB — COMPREHENSIVE METABOLIC PANEL (CC13)
ALBUMIN: 3.7 g/dL (ref 3.5–5.0)
ALT: 9 U/L (ref 0–55)
AST: 12 U/L (ref 5–34)
Alkaline Phosphatase: 81 U/L (ref 40–150)
Anion Gap: 11 mEq/L (ref 3–11)
BUN: 13.7 mg/dL (ref 7.0–26.0)
CHLORIDE: 102 meq/L (ref 98–109)
CO2: 23 mEq/L (ref 22–29)
Calcium: 9.4 mg/dL (ref 8.4–10.4)
Creatinine: 0.7 mg/dL (ref 0.6–1.1)
GLUCOSE: 130 mg/dL (ref 70–140)
POTASSIUM: 4.1 meq/L (ref 3.5–5.1)
Sodium: 137 mEq/L (ref 136–145)
Total Bilirubin: 0.29 mg/dL (ref 0.20–1.20)
Total Protein: 7.8 g/dL (ref 6.4–8.3)

## 2014-09-21 LAB — T3 UPTAKE: T3 UPTAKE: 27 % (ref 22–35)

## 2014-09-21 LAB — T4: T4, Total: 7.2 ug/dL (ref 4.5–12.0)

## 2014-09-21 LAB — TSH CHCC: TSH: 2.468 m(IU)/L (ref 0.308–3.960)

## 2014-09-21 MED ORDER — FAMOTIDINE IN NACL 20-0.9 MG/50ML-% IV SOLN
INTRAVENOUS | Status: AC
  Filled 2014-09-21: qty 50

## 2014-09-21 MED ORDER — DEXAMETHASONE SODIUM PHOSPHATE 20 MG/5ML IJ SOLN
INTRAMUSCULAR | Status: AC
  Filled 2014-09-21: qty 5

## 2014-09-21 MED ORDER — DIPHENHYDRAMINE HCL 50 MG/ML IJ SOLN
25.0000 mg | Freq: Once | INTRAMUSCULAR | Status: AC
  Administered 2014-09-21: 25 mg via INTRAVENOUS

## 2014-09-21 MED ORDER — FAMOTIDINE IN NACL 20-0.9 MG/50ML-% IV SOLN
20.0000 mg | Freq: Once | INTRAVENOUS | Status: AC
  Administered 2014-09-21: 20 mg via INTRAVENOUS

## 2014-09-21 MED ORDER — DEXAMETHASONE SODIUM PHOSPHATE 20 MG/5ML IJ SOLN
20.0000 mg | Freq: Once | INTRAMUSCULAR | Status: AC
Start: 2014-09-21 — End: 2014-09-21
  Administered 2014-09-21: 20 mg via INTRAVENOUS

## 2014-09-21 MED ORDER — ONDANSETRON 8 MG/NS 50 ML IVPB
INTRAVENOUS | Status: AC
  Filled 2014-09-21: qty 8

## 2014-09-21 MED ORDER — DEXTROSE 5 % IV SOLN
80.0000 mg/m2 | Freq: Once | INTRAVENOUS | Status: AC
  Administered 2014-09-21: 126 mg via INTRAVENOUS
  Filled 2014-09-21: qty 21

## 2014-09-21 MED ORDER — SODIUM CHLORIDE 0.9 % IV SOLN
Freq: Once | INTRAVENOUS | Status: AC
  Administered 2014-09-21: 13:00:00 via INTRAVENOUS

## 2014-09-21 MED ORDER — FERROUS FUMARATE 325 (106 FE) MG PO TABS: ORAL_TABLET | ORAL | Status: AC

## 2014-09-21 MED ORDER — ONDANSETRON 8 MG/50ML IVPB (CHCC)
8.0000 mg | Freq: Once | INTRAVENOUS | Status: AC
  Administered 2014-09-21: 8 mg via INTRAVENOUS

## 2014-09-21 MED ORDER — DIPHENHYDRAMINE HCL 50 MG/ML IJ SOLN
INTRAMUSCULAR | Status: AC
  Filled 2014-09-21: qty 1

## 2014-09-21 NOTE — Patient Instructions (Signed)
[facility] Discharge Instructions for Patients Receiving Chemotherapy  Today you received the following chemotherapy agents: Taxol.  To help prevent nausea and vomiting after your treatment, we encourage you to take your nausea medication as prescribed.   If you develop nausea and vomiting that is not controlled by your nausea medication, call the clinic.   BELOW ARE SYMPTOMS THAT SHOULD BE REPORTED IMMEDIATELY:  *FEVER GREATER THAN 100.5 F  *CHILLS WITH OR WITHOUT FEVER  NAUSEA AND VOMITING THAT IS NOT CONTROLLED WITH YOUR NAUSEA MEDICATION  *UNUSUAL SHORTNESS OF BREATH  *UNUSUAL BRUISING OR BLEEDING  TENDERNESS IN MOUTH AND THROAT WITH OR WITHOUT PRESENCE OF ULCERS  *URINARY PROBLEMS  *BOWEL PROBLEMS  UNUSUAL RASH Items with * indicate a potential emergency and should be followed up as soon as possible.  Feel free to call the clinic you have any questions or concerns. The clinic phone number is [phone].    

## 2014-09-21 NOTE — Progress Notes (Signed)
Nutrition follow-up completed with patient during chemotherapy. Patient continues to feel well and denies nutrition impact symptoms. Weight decreased again to 110 pounds today from 113.2 pounds February 25.  Patient continues to attributes this to fluid loss. Patient states she eats well and is trying to eat protein bars. She dislikes protein shakes.  Nutrition diagnosis: Unintended weight loss continues.  Intervention: Patient educated to continue strategies for increased calories and protein. Patient encouraged to consume protein 6 times daily. Teach back method used.  Monitoring, evaluation, goals: Patient will work to increase oral intake to minimize loss of lean body mass.  Next visit: Thursday, March 17, during chemotherapy.  **Disclaimer: This note was dictated with voice recognition software. Similar sounding words can inadvertently be transcribed and this note may contain transcription errors which may not have been corrected upon publication of note.**

## 2014-09-21 NOTE — Patient Instructions (Signed)
We will change the chemo schedule to once every 3 weeks, starting 09-28-14. This will be the same dose of carboplatin and a higher dose of taxol than what you have been getting.  Night before chemo you will take five of the decadron tablets (=total 20 mg) with food 12 hrs before your chemo is scheduled, and five decadron tablets with food again 6 hours before chemo.  You may have more aching with this higher dose of the taxol, and you will lose your hair - but you will not have as many trips to the office! Call if you need anything   Start iron either as prescription Hemocyte if your insurance will cover it, or as over the counter ferrous fumarate, ~ 325 mg daily on empty stomach with OJ

## 2014-09-21 NOTE — Progress Notes (Signed)
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 She is due day 15 cycle 2 today and will have granix on 09-22-14. She is tolerating chemotherapy also very well. Plan is to reimage and follow up with Dr Denman George after 3-4 cycles, for possible interval debulking.  Patient is feeling progressively better and stronger with response on the chemotherapy. Appetite is good, with no nausea and bowels now moving daily with regular stool softener; diet review sounds good and [place] nutritionist to follow up also today. Abdomen is progressively less distended. She has been somewhat more active at home, and did some shopping last week. She has no peripheral neuropathy.   PAC in Flu vaccine done Referral made for genetics counseling, as patient in agreement with this recommendation.  ONCOLOGIC HISTORY Gyn cancer: Patient became symptomatic with abdominal swelling and constipation in late Nov 2015, seen and treated initially at [facility] ED and by PCP Dr Cleda Mccreedy for what was thought to be ulcer. Symptoms continued, with early satiety causing poor po intake; she was seen again at [facility] ED in early Jan 2016, with CT reportedly consistent with stage IIIC or IV gyn cancer, including omental caking, large pelvic mass, para-aortic adenopathy, ascites, pleural effusions and right subpleural nodule. She was seen in consultation by Dr Denman George on 07-25-14, with concern from her exam and scan for rectal involvement. 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 She had paracentesis of 3.4 liters ascites on 07-27-2014, with cytology ([id number]) showing serous carcinoma consistent with gyn primary. She  had right thoracentesis for 350 cc on 08-11-14, this requested by anesthesia prior to surgery and apparently not sent for cytology. Patient was more comfortable after paracentesis, but could not tell any difference in breathing after thoracentesis (denies SOB prior to that procedure). CA 125 on 08-02-14 was 1162. She was taken to exploratory laparotomy by Dr Denman George on 08-15-14, with 4 liters of ascites removed and biopsy of omentum obtained, with additional debulking not attempted due to surgical findings of entire peritoneal surface replaced by tumor plaque, small intestine coated with tumor and mesentery tethered with tumor nodules, ileum adherent to pelvic mass, dense plaque of tumor on right diaphragm and 10 cm omental cake in LUQ. Pathology of omental biopsy She needed another paracentesis by IR for 1.3 liters ascites on 08-17-14, and had PAC placed by IR also 08-17-14. Day 1 cycle 1 dose dense carboplatin taxol was given in hospital on 08-18-14.   Breast cancer: Left breast biopsy 11-22-2003 ([id number]) invasive carcinoma. Left breast wide excision with sentinel nodes and axillary contents by Dr Benson Norway 01-03-2004 [phone]) poorly differentiated invasive ductal carcinoma 3.0 cm with closest margin 1.7 cm deep, 6 sentinel nodes + 3 additional nodes all negative, ER 0, PR 0, HER 2 3+, Ki67 of 19%. . She received adjuvant chemotherapy by Dr Sonny Dandy with 5FU, epirubicin, cytoxan x 4 cycles from 02-13-2004 thru 04-16-2004, followed by taxol 80 mg/m2 weekly x 11 from 05-07-2004 thru 09-04-2004, and herceptin 05-21-2004 thru 02-25-2005. Antiemetics were Kytril and decadron. She had PAC for the chemotherapy. She had radiation by Dr Tyler Pita in [place]. Last note from Dr Sonny Dandy in this information was 04-2008, tho year follow up was recommended then.  Most recent mammograms  were in [place] 02-2014, reportedly not remarkable.  Review of systems as above, also: No problems with PAC, which no longer feels as "tight" as  immediately post placement. No fever or symptoms of infection. No LE swelling. No bleeding. Bladder ok. No SOB or chest pain. Hair is thinning. No mucositis symptoms. Is able to sleep. Remainder of 10 point Review of Systems negative.  Objective:  Vital signs in last 24 hours:  BP 135/73 mmHg  Pulse 87  Temp(Src) 98.4 F (36.9 C) (Oral)  Resp 18  Ht 5' 2"  (1.575 m)  Wt 110 lb 9.6 oz (50.168 kg)  BMI 20.22 kg/m2 Weight down 2.5 lbs, seems to be mobilization of additional ascites. Alert, oriented and appropriate. Ambulatory without difficulty.  Alopecia  HEENT:PERRL, sclerae not icteric. Oral mucosa moist without lesions, posterior pharynx clear.  Neck supple. No JVD.  Lymphatics:no cervical,supraclavicular, axillary or inguinal adenopathy Resp: clear to auscultation to bases bilaterally and no dullness to percussion bilaterally Cardio: regular rate and rhythm. No gallop. GI: soft, nontender, even less distended than at my last exam. Normally active bowel sounds. Surgical incision healed other than 0.5 cm eschar just below umbilicus, no erythema or tenderness Musculoskeletal/ Extremities: without pitting edema, cords, tenderness Neuro: no peripheral neuropathy. Otherwise nonfocal. PSYCH appropriate mood and affect Skin without rash, ecchymosis, petechiae Breasts: exam not repeated today Portacath-without erythema or tenderness  Lab Results:  Results for orders placed or performed in visit on 09/21/14  CBC with Differential  Result Value Ref Range   WBC 4.4 3.9 - 10.3 10e3/uL   NEUT# 3.3 1.5 - 6.5 10e3/uL   HGB 10.5 (L) 11.6 - 15.9 g/dL   HCT 32.6 (L) 34.8 - 46.6 %   Platelets 289 145 - 400 10e3/uL   MCV 85.3 79.5 - 101.0 fL   MCH 27.5 25.1 - 34.0 pg   MCHC 32.2 31.5 - 36.0 g/dL   RBC 3.82 3.70 - 5.45 10e6/uL   RDW 17.0 (H) 11.2 - 14.5 %   lymph# 0.9 0.9 - 3.3 10e3/uL   MONO# 0.2 0.1 - 0.9 10e3/uL   Eosinophils Absolute 0.0 0.0 - 0.5 10e3/uL   Basophils Absolute 0.0 0.0  - 0.1 10e3/uL   NEUT% 75.2 38.4 - 76.8 %   LYMPH% 20.0 14.0 - 49.7 %   MONO% 4.8 0.0 - 14.0 %   EOS% 0.0 0.0 - 7.0 %   BASO% 0.0 0.0 - 2.0 %  Comprehensive metabolic panel (Cmet) - [facility]  Result Value Ref Range   Sodium 137 136 - 145 mEq/L   Potassium 4.1 3.5 - 5.1 mEq/L   Chloride 102 98 - 109 mEq/L   CO2 23 22 - 29 mEq/L   Glucose 130 70 - 140 mg/dl   BUN 13.7 7.0 - 26.0 mg/dL   Creatinine 0.7 0.6 - 1.1 mg/dL   Total Bilirubin 0.29 0.20 - 1.20 mg/dL   Alkaline Phosphatase 81 40 - 150 U/L   AST 12 5 - 34 U/L   ALT 9 0 - 55 U/L   Total Protein 7.8 6.4 - 8.3 g/dL   Albumin 3.7 3.5 - 5.0 g/dL   Calcium 9.4 8.4 - 10.4 mg/dL   Anion Gap 11 3 - 11 mEq/L   EGFR >90 >90 ml/min/1.73 m2  TSH-Thyroid Stimulating Hormone  Result Value Ref Range   TSH 2.468 0.308 - 3.960 m(IU)/L   T3 uptake 27 T4   7.2 (TFTs available after visit)  CA 125 had been 1162 on first determination 08-02-14.  This was stable at 1182 on 09-07-14, with patient clinically much improved and rate of rise not known. Will follow.   Studies/Results:  No results found. Plan repeat scans late March, with visit back to Dr Denman George then  Medications: I have reviewed the patient's current medications. Discussed hemocyte vs ferrous fumarate. Change in premed decadron as below.  DISCUSSION: as patient is doing do much better now completing 2 cycles of dose dense carbo taxol, I have offered her change to q 3 week regimen starting next week. She understands that the higher dose of taxol may cause increased aches (transient) and could increase peripheral neuropathy more abruptly, but would probably be easier on her overall from standpoint of transportation. She will need to take decadron 20 mg 12 hrs and 6 hs prior to first full dose taxol. Patient would very much like to change to q 3 week regimen. Financial staff notified for preauthorization of this chemo change. She will likely need neulasta when I see her back on day 7.  She  agrees to genetics referral, tho she is only member of immediate family with cancer diagnosis.   Discussed CA 125 as above.  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 Granix needed day after each dose dense treatment due to leukopenia cycle 1. Beginning 3-10 will change carbo taxol dosing to q 3 week regimen. I will see her back (no treatment) on 3-17 and will need gCSF probably as neulasta then. Plan scans late March and re-evaluation by gyn oncology then. 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 Up to date on mammograms. Genetics testing as above 4.Pleural effusions and ascites clinically improved. 5.severe malnutrition in context of chronic illness: as above, [place] nutritionist following, po intake much better. Not clear how much weight she had really lost in several months prior to diagnosis due to ascites etc.  6.constipation resolved: tumor in proximity to rectum. Needs stool softeners and prn laxatives ongoing.  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 BP low in hospital after surgery, responded to IVF, vasotec and lasix resumed prior to DC, now again off lasix 8.GERD, oral candida likely from steroids, possibly esophageal candida: symptoms resolved with diflucan 9.post parathyroidectomy 2005 10.hypokalemia resolved off lasix, which she does not need to resume 11.hypothyroidism on replacement: this dose was increased ~ Nov when she was initially  symptomatic from what was actually this malignancy, TFTs all in good range on present dose. I will send this information to Dr Eula Fried, but doubt this is related to weight loss now. Message to RN to let patient know. [email or web address] anemia:  Add oral iron as hemocyte or ferrous fumarate. Follow  Patient understood discussion and is in agreement with plans above. Chemo orders confirmed for day 15 cycle 2 today + granix 3-4. Orders to be changed after today's treatment as above, financial staff notified.  Time spent 30 min including >50% counseling and coordination of care.  Hobie Kohles P, MD   09/21/2014, 1:31 PM

## 2014-09-22 ENCOUNTER — Telehealth: Payer: Self-pay

## 2014-09-22 ENCOUNTER — Ambulatory Visit (HOSPITAL_BASED_OUTPATIENT_CLINIC_OR_DEPARTMENT_OTHER): Payer: 59

## 2014-09-22 DIAGNOSIS — C801 Malignant (primary) neoplasm, unspecified: Secondary | ICD-10-CM

## 2014-09-22 DIAGNOSIS — Z5189 Encounter for other specified aftercare: Secondary | ICD-10-CM

## 2014-09-22 DIAGNOSIS — C569 Malignant neoplasm of unspecified ovary: Secondary | ICD-10-CM

## 2014-09-22 MED ORDER — TBO-FILGRASTIM 300 MCG/0.5ML ~~LOC~~ SOSY
300.0000 ug | PREFILLED_SYRINGE | Freq: Once | SUBCUTANEOUS | Status: AC
  Administered 2014-09-22: 300 ug via SUBCUTANEOUS
  Filled 2014-09-22: qty 0.5

## 2014-09-22 MED ORDER — FILGRASTIM 300 MCG/0.5ML IJ SOSY: 300.0000 ug | PREFILLED_SYRINGE | Freq: Once | INTRAMUSCULAR | Status: DC

## 2014-09-22 NOTE — Telephone Encounter (Signed)
-----   Message from Gordy Levan, MD sent at 09/21/2014  8:23 PM EST ----- Labs seen and need follow up: please let her know TFTs all ok.

## 2014-09-22 NOTE — Telephone Encounter (Signed)
Told Kathleen Bond the results of her TFT as noted below.

## 2014-09-23 ENCOUNTER — Other Ambulatory Visit: Payer: Self-pay | Admitting: Oncology

## 2014-09-25 ENCOUNTER — Ambulatory Visit: Payer: 59

## 2014-09-25 ENCOUNTER — Telehealth: Payer: Self-pay | Admitting: *Deleted

## 2014-09-25 ENCOUNTER — Other Ambulatory Visit: Payer: 59

## 2014-09-25 ENCOUNTER — Ambulatory Visit: Payer: 59 | Admitting: Oncology

## 2014-09-25 DIAGNOSIS — C569 Malignant neoplasm of unspecified ovary: Secondary | ICD-10-CM

## 2014-09-25 MED ORDER — DEXAMETHASONE 4 MG PO TABS: ORAL_TABLET | ORAL | Status: DC

## 2014-09-25 NOTE — Telephone Encounter (Signed)
-----   Message from Gordy Levan, MD sent at 09/23/2014 10:03 AM EST ----- Please let her know that thyroid tests were all just right, so present dose is correct and I do not think thyroid is adding to the weight loss.  She needs to take decadron 20 mg with food 12 hrs and 6 hrs before first long taxol. If she does ok with that, we may be able to cut down for subsequent treatments. Remind her that she may have increased taxol aches with the higher dose. If possible, please have RN call to check on her ~ Mon 3-14 (first long taxol 3-10, doubt aches will begin by Fri 3-11)  thanks

## 2014-09-25 NOTE — Telephone Encounter (Signed)
Called patient as noted below by Dr. Marko Plume. She is agreeable to take 5 decadron tablets 12 hours and 6 hours prior to first long taxol on 09/28/14. Patient states she only has 5 decadron tablets left - refill sent to her pharmacy. Also, reminded patient that she may have increased aches with the higher dose of taxol - patient states understanding. Told patient to please call us back with any questions or concerns.

## 2014-09-25 NOTE — Telephone Encounter (Signed)
Per staff message and POF I have scheduled appts. Advised scheduler of appts. JMW  

## 2014-09-28 ENCOUNTER — Other Ambulatory Visit: Payer: Self-pay | Admitting: Oncology

## 2014-09-28 ENCOUNTER — Telehealth: Payer: Self-pay | Admitting: Oncology

## 2014-09-28 ENCOUNTER — Ambulatory Visit (HOSPITAL_BASED_OUTPATIENT_CLINIC_OR_DEPARTMENT_OTHER): Payer: 59

## 2014-09-28 ENCOUNTER — Other Ambulatory Visit (HOSPITAL_BASED_OUTPATIENT_CLINIC_OR_DEPARTMENT_OTHER): Payer: 59

## 2014-09-28 DIAGNOSIS — C569 Malignant neoplasm of unspecified ovary: Secondary | ICD-10-CM

## 2014-09-28 DIAGNOSIS — C801 Malignant (primary) neoplasm, unspecified: Secondary | ICD-10-CM

## 2014-09-28 DIAGNOSIS — Z5111 Encounter for antineoplastic chemotherapy: Secondary | ICD-10-CM

## 2014-09-28 LAB — CBC WITH DIFFERENTIAL/PLATELET
BASO%: 0 % (ref 0.0–2.0)
Basophils Absolute: 0 10*3/uL (ref 0.0–0.1)
EOS%: 0 % (ref 0.0–7.0)
Eosinophils Absolute: 0 10*3/uL (ref 0.0–0.5)
HEMATOCRIT: 29.9 % — AB (ref 34.8–46.6)
HGB: 9.8 g/dL — ABNORMAL LOW (ref 11.6–15.9)
LYMPH#: 1 10*3/uL (ref 0.9–3.3)
LYMPH%: 14.7 % (ref 14.0–49.7)
MCH: 28.4 pg (ref 25.1–34.0)
MCHC: 32.8 g/dL (ref 31.5–36.0)
MCV: 86.7 fL (ref 79.5–101.0)
MONO#: 0.1 10*3/uL (ref 0.1–0.9)
MONO%: 1.5 % (ref 0.0–14.0)
NEUT#: 5.6 10*3/uL (ref 1.5–6.5)
NEUT%: 83.8 % — ABNORMAL HIGH (ref 38.4–76.8)
PLATELETS: 178 10*3/uL (ref 145–400)
RBC: 3.45 10*6/uL — AB (ref 3.70–5.45)
RDW: 18.4 % — AB (ref 11.2–14.5)
WBC: 6.7 10*3/uL (ref 3.9–10.3)

## 2014-09-28 LAB — COMPREHENSIVE METABOLIC PANEL (CC13)
ALT: 16 U/L (ref 0–55)
AST: 12 U/L (ref 5–34)
Albumin: 3.5 g/dL (ref 3.5–5.0)
Alkaline Phosphatase: 71 U/L (ref 40–150)
Anion Gap: 12 mEq/L — ABNORMAL HIGH (ref 3–11)
BILIRUBIN TOTAL: 0.24 mg/dL (ref 0.20–1.20)
BUN: 16.5 mg/dL (ref 7.0–26.0)
CHLORIDE: 105 meq/L (ref 98–109)
CO2: 22 mEq/L (ref 22–29)
Calcium: 9.3 mg/dL (ref 8.4–10.4)
Creatinine: 0.7 mg/dL (ref 0.6–1.1)
GLUCOSE: 140 mg/dL (ref 70–140)
Potassium: 3.9 mEq/L (ref 3.5–5.1)
Sodium: 140 mEq/L (ref 136–145)
TOTAL PROTEIN: 7.2 g/dL (ref 6.4–8.3)

## 2014-09-28 MED ORDER — SODIUM CHLORIDE 0.9 % IV SOLN
Freq: Once | INTRAVENOUS | Status: AC
  Administered 2014-09-28: 13:00:00 via INTRAVENOUS

## 2014-09-28 MED ORDER — SODIUM CHLORIDE 0.9 % IJ SOLN
10.0000 mL | INTRAMUSCULAR | Status: DC | PRN
  Administered 2014-09-28: 10 mL
  Filled 2014-09-28: qty 10

## 2014-09-28 MED ORDER — HEPARIN SOD (PORK) LOCK FLUSH 100 UNIT/ML IV SOLN
500.0000 [IU] | Freq: Once | INTRAVENOUS | Status: AC | PRN
  Administered 2014-09-28: 500 [IU]
  Filled 2014-09-28: qty 5

## 2014-09-28 MED ORDER — DIPHENHYDRAMINE HCL 50 MG/ML IJ SOLN
50.0000 mg | Freq: Once | INTRAMUSCULAR | Status: AC
  Administered 2014-09-28: 50 mg via INTRAVENOUS

## 2014-09-28 MED ORDER — FAMOTIDINE IN NACL 20-0.9 MG/50ML-% IV SOLN
20.0000 mg | Freq: Once | INTRAVENOUS | Status: AC
  Administered 2014-09-28: 20 mg via INTRAVENOUS

## 2014-09-28 MED ORDER — DIPHENHYDRAMINE HCL 50 MG/ML IJ SOLN
INTRAMUSCULAR | Status: AC
  Filled 2014-09-28: qty 1

## 2014-09-28 MED ORDER — SODIUM CHLORIDE 0.9 % IV SOLN
417.5000 mg | Freq: Once | INTRAVENOUS | Status: AC
  Administered 2014-09-28: 420 mg via INTRAVENOUS
  Filled 2014-09-28: qty 42

## 2014-09-28 MED ORDER — PACLITAXEL CHEMO INJECTION 300 MG/50ML
175.0000 mg/m2 | Freq: Once | INTRAVENOUS | Status: AC
  Administered 2014-09-28: 258 mg via INTRAVENOUS
  Filled 2014-09-28: qty 43

## 2014-09-28 MED ORDER — FAMOTIDINE IN NACL 20-0.9 MG/50ML-% IV SOLN
INTRAVENOUS | Status: AC
  Filled 2014-09-28: qty 50

## 2014-09-28 MED ORDER — SODIUM CHLORIDE 0.9 % IV SOLN
Freq: Once | INTRAVENOUS | Status: AC
  Administered 2014-09-28: 13:00:00 via INTRAVENOUS
  Filled 2014-09-28: qty 8

## 2014-09-28 NOTE — Patient Instructions (Signed)
[facility] Discharge Instructions for Patients Receiving Chemotherapy  Today you received the following chemotherapy agents: Taxol and Carboplatin.  To help prevent nausea and vomiting after your treatment, we encourage you to take your nausea medication:Compazine 10 mg every 6 hours as needed. Zofran 8 mg every 8 hours as needed.   If you develop nausea and vomiting that is not controlled by your nausea medication, call the clinic.   BELOW ARE SYMPTOMS THAT SHOULD BE REPORTED IMMEDIATELY:  *FEVER GREATER THAN 100.5 F  *CHILLS WITH OR WITHOUT FEVER  NAUSEA AND VOMITING THAT IS NOT CONTROLLED WITH YOUR NAUSEA MEDICATION  *UNUSUAL SHORTNESS OF BREATH  *UNUSUAL BRUISING OR BLEEDING  TENDERNESS IN MOUTH AND THROAT WITH OR WITHOUT PRESENCE OF ULCERS  *URINARY PROBLEMS  *BOWEL PROBLEMS  UNUSUAL RASH Items with * indicate a potential emergency and should be followed up as soon as possible.  Feel free to call the clinic you have any questions or concerns. The clinic phone number is [phone].

## 2014-09-28 NOTE — Telephone Encounter (Signed)
per LL to CX in 3/11 inj

## 2014-09-29 ENCOUNTER — Ambulatory Visit: Payer: 59

## 2014-10-01 ENCOUNTER — Other Ambulatory Visit: Payer: Self-pay | Admitting: Oncology

## 2014-10-01 DIAGNOSIS — C569 Malignant neoplasm of unspecified ovary: Secondary | ICD-10-CM

## 2014-10-02 ENCOUNTER — Telehealth: Payer: Self-pay

## 2014-10-02 ENCOUNTER — Telehealth: Payer: Self-pay | Admitting: Oncology

## 2014-10-02 NOTE — Telephone Encounter (Signed)
-----   Message from Gordy Levan, MD sent at 09/23/2014 10:03 AM EST ----- Please let her know that thyroid tests were all just right, so present dose is correct and I do not think thyroid is adding to the weight loss.  She needs to take decadron 20 mg with food 12 hrs and 6 hrs before first long taxol. If she does ok with that, we may be able to cut down for subsequent treatments. Remind her that she may have increased taxol aches with the higher dose. If possible, please have RN call to check on her ~ Mon 3-14 (first long taxol 3-10, doubt aches will begin by Fri 3-11)  thanks

## 2014-10-02 NOTE — Telephone Encounter (Signed)
Spoke with Ms. Kathleen Bond and she said that she has been experiencing some aches in her kneecaps.  She has been using the percocet to help with some of the aches.   She has tolerated the aches rather well.  Suggested she try using a heating pad or taking a warm bath to help sooth the achy ness.  Kathleen Bond verbalized understanding.

## 2014-10-02 NOTE — Telephone Encounter (Signed)
Spke with Kathleen Bond' mother caregiver as Kathleen Bond was attending a family funeral. [place] giver stated that Kathleen Bond was diong ok.  Kathleen Bond told the care giver that her stomach hurt a little today.  It could have been the greens and pinto beans that she ate yesterday and giving her gas.

## 2014-10-02 NOTE — Telephone Encounter (Signed)
3/25 inj cx per 3/13 pof  anne

## 2014-10-05 ENCOUNTER — Ambulatory Visit: Payer: 59

## 2014-10-05 ENCOUNTER — Encounter: Payer: Self-pay | Admitting: Oncology

## 2014-10-05 ENCOUNTER — Other Ambulatory Visit (HOSPITAL_BASED_OUTPATIENT_CLINIC_OR_DEPARTMENT_OTHER): Payer: 59

## 2014-10-05 ENCOUNTER — Ambulatory Visit (HOSPITAL_BASED_OUTPATIENT_CLINIC_OR_DEPARTMENT_OTHER): Payer: 59

## 2014-10-05 ENCOUNTER — Telehealth: Payer: Self-pay | Admitting: Oncology

## 2014-10-05 ENCOUNTER — Ambulatory Visit (HOSPITAL_BASED_OUTPATIENT_CLINIC_OR_DEPARTMENT_OTHER): Payer: 59 | Admitting: Oncology

## 2014-10-05 ENCOUNTER — Encounter: Payer: 59 | Admitting: Nutrition

## 2014-10-05 VITALS — BP 140/93 | HR 90 | Temp 98.2°F | Resp 18 | Ht 62.0 in | Wt 114.9 lb

## 2014-10-05 DIAGNOSIS — R188 Other ascites: Secondary | ICD-10-CM

## 2014-10-05 DIAGNOSIS — E039 Hypothyroidism, unspecified: Secondary | ICD-10-CM

## 2014-10-05 DIAGNOSIS — C801 Malignant (primary) neoplasm, unspecified: Secondary | ICD-10-CM

## 2014-10-05 DIAGNOSIS — Z853 Personal history of malignant neoplasm of breast: Secondary | ICD-10-CM

## 2014-10-05 DIAGNOSIS — C569 Malignant neoplasm of unspecified ovary: Secondary | ICD-10-CM

## 2014-10-05 DIAGNOSIS — Z95828 Presence of other vascular implants and grafts: Secondary | ICD-10-CM

## 2014-10-05 DIAGNOSIS — D701 Agranulocytosis secondary to cancer chemotherapy: Secondary | ICD-10-CM

## 2014-10-05 DIAGNOSIS — D649 Anemia, unspecified: Secondary | ICD-10-CM

## 2014-10-05 DIAGNOSIS — T451X5A Adverse effect of antineoplastic and immunosuppressive drugs, initial encounter: Secondary | ICD-10-CM

## 2014-10-05 DIAGNOSIS — E43 Unspecified severe protein-calorie malnutrition: Secondary | ICD-10-CM

## 2014-10-05 DIAGNOSIS — Z5189 Encounter for other specified aftercare: Secondary | ICD-10-CM

## 2014-10-05 DIAGNOSIS — R180 Malignant ascites: Secondary | ICD-10-CM

## 2014-10-05 DIAGNOSIS — J90 Pleural effusion, not elsewhere classified: Secondary | ICD-10-CM

## 2014-10-05 LAB — CBC WITH DIFFERENTIAL/PLATELET
BASO%: 0.3 % (ref 0.0–2.0)
BASOS ABS: 0 10*3/uL (ref 0.0–0.1)
EOS%: 0 % (ref 0.0–7.0)
Eosinophils Absolute: 0 10*3/uL (ref 0.0–0.5)
HEMATOCRIT: 31.5 % — AB (ref 34.8–46.6)
HEMOGLOBIN: 9.9 g/dL — AB (ref 11.6–15.9)
LYMPH%: 26.1 % (ref 14.0–49.7)
MCH: 27.7 pg (ref 25.1–34.0)
MCHC: 31.5 g/dL (ref 31.5–36.0)
MCV: 88.1 fL (ref 79.5–101.0)
MONO#: 0 10*3/uL — AB (ref 0.1–0.9)
MONO%: 0.6 % (ref 0.0–14.0)
NEUT#: 1.1 10*3/uL — ABNORMAL LOW (ref 1.5–6.5)
NEUT%: 73 % (ref 38.4–76.8)
PLATELETS: 186 10*3/uL (ref 145–400)
RBC: 3.57 10*6/uL — AB (ref 3.70–5.45)
RDW: 19.5 % — ABNORMAL HIGH (ref 11.2–14.5)
WBC: 1.5 10*3/uL — ABNORMAL LOW (ref 3.9–10.3)
lymph#: 0.4 10*3/uL — ABNORMAL LOW (ref 0.9–3.3)

## 2014-10-05 LAB — COMPREHENSIVE METABOLIC PANEL (CC13)
ALBUMIN: 3.8 g/dL (ref 3.5–5.0)
ALT: 23 U/L (ref 0–55)
AST: 15 U/L (ref 5–34)
Alkaline Phosphatase: 62 U/L (ref 40–150)
Anion Gap: 11 mEq/L (ref 3–11)
BUN: 11.8 mg/dL (ref 7.0–26.0)
CHLORIDE: 103 meq/L (ref 98–109)
CO2: 23 mEq/L (ref 22–29)
Calcium: 9.4 mg/dL (ref 8.4–10.4)
Creatinine: 0.8 mg/dL (ref 0.6–1.1)
EGFR: >90 mL/min/{1.73_m2} (ref >90)
Glucose: 139 mg/dl (ref 70–140)
Potassium: 4.6 mEq/L (ref 3.5–5.1)
Sodium: 137 mEq/L (ref 136–145)
TOTAL PROTEIN: 7.4 g/dL (ref 6.4–8.3)
Total Bilirubin: 0.45 mg/dL (ref 0.20–1.20)

## 2014-10-05 MED ORDER — PEGFILGRASTIM INJECTION 6 MG/0.6ML ~~LOC~~
6.0000 mg | PREFILLED_SYRINGE | Freq: Once | SUBCUTANEOUS | Status: AC
  Administered 2014-10-05: 6 mg via SUBCUTANEOUS
  Filled 2014-10-05: qty 0.6

## 2014-10-05 NOTE — Progress Notes (Signed)
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 FIrst 2 cycles of chemotherapy were with dose dense regimen, beginning 08-18-14. With improvement in status overall, regimen was changed to every 3 week carbo taxol beginning with cycle 3 on 09-28-14. She is for repeat CT 3-28 and to see Dr Denman George on 10-23-14. Interval debulking may be considered depending on response to chemotherapy.  Patient did well with cycle 3 treatment, tho she did have some aching in knees and legs days 3-5. She has had no nausea, appetite fine, bowels moving regularly, no significant peripheral neuropathy. Energy has been good overall.    PAC in Flu vaccine done  genetics counseling scheduled next week  ONCOLOGIC HISTORY Gyn cancer: Patient became symptomatic with abdominal swelling and constipation in late Nov 2015, seen and treated initially at [facility] ED and by PCP Dr Cleda Mccreedy for what was thought to be ulcer. Symptoms continued, with early satiety causing poor po intake; she was seen again at [facility] ED in early Jan 2016, with CT reportedly consistent with stage IIIC or IV gyn cancer, including omental caking, large pelvic mass, para-aortic adenopathy, ascites, pleural effusions and right subpleural nodule. She was seen in consultation by Dr Denman George on 07-25-14, with concern from her exam and scan for rectal involvement. 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 She had paracentesis of 3.4 liters ascites on 07-27-2014, with cytology ([id number]) showing serous carcinoma consistent with gyn primary. She had right thoracentesis for 350 cc on 08-11-14, this requested by anesthesia prior to surgery and  apparently not sent for cytology. Patient was more comfortable after paracentesis, but could not tell any difference in breathing after thoracentesis (denies SOB prior to that procedure). CA 125 on 08-02-14 was 1162. She was taken to exploratory laparotomy by Dr Denman George on 08-15-14, with 4 liters of ascites removed and biopsy of omentum obtained, with additional debulking not attempted due to surgical findings of entire peritoneal surface replaced by tumor plaque, small intestine coated with tumor and mesentery tethered with tumor nodules, ileum adherent to pelvic mass, dense plaque of tumor on right diaphragm and 10 cm omental cake in LUQ. Pathology of omental biopsy She needed another paracentesis by IR for 1.3 liters ascites on 08-17-14, and had PAC placed by IR also 08-17-14. Day 1 cycle 1 dose dense carboplatin taxol was given in hospital on 08-18-14. After 2 cycles of dose dense regimen, the carboplatin and taxol was changed to every 3 week regimen beginning cycle 3.  Breast cancer: Left breast biopsy 11-22-2003 ([id number]) invasive carcinoma. Left breast wide excision with sentinel nodes and axillary contents by Dr Benson Norway 01-03-2004 [phone]) poorly differentiated invasive ductal carcinoma 3.0 cm with closest margin 1.7 cm deep, 6 sentinel nodes + 3 additional nodes all negative, ER 0, PR 0, HER 2 3+, Ki67 of 19%. . She received adjuvant chemotherapy by Dr Sonny Dandy with 5FU, epirubicin, cytoxan x 4 cycles from 02-13-2004 thru 04-16-2004, followed by taxol 80 mg/m2 weekly x 11 from 05-07-2004 thru 09-04-2004, and herceptin 05-21-2004 thru 02-25-2005. Antiemetics were Kytril and decadron. She had PAC for the chemotherapy. She had radiation by Dr Tyler Pita in [place]. Last note from Dr Sonny Dandy in this information was 04-2008, tho year follow up was  recommended then.  Most recent mammograms were in [place] 02-2014, reportedly not remarkable.    Review of systems as above, also: No fever or symptoms of infection. No  abdominal or pelvic pain. Abdomen seems almost back to her baseline girth. No bleeding. No LE swelling. No SOB. No problems with PAC. Remainder of 10 point Review of Systems negative.  Objective:  Vital signs in last 24 hours:  BP 140/93 mmHg  Pulse 90  Temp(Src) 98.2 F (36.8 C) (Oral)  Resp 18  Ht 5' 2"  (1.575 m)  Wt 114 lb 14.4 oz (52.118 kg)  BMI 21.01 kg/m2  SpO2 99% Weight is up 4 lbs. Alert, oriented and appropriate. Ambulatory without difficulty.  Hair thinning.  HEENT:PERRL, sclerae not icteric. Oral mucosa moist without lesions, posterior pharynx clear.  Neck supple. No JVD.  Lymphatics:no cervical,suraclavicular, axillary or inguinal adenopathy Resp: clear to auscultation bilaterally and normal percussion bilaterally Cardio: regular rate and rhythm. No gallop. GI: soft, nontender, not distended, no mass or organomegaly. Normally active bowel sounds. Surgical incision not remarkable. Musculoskeletal/ Extremities: without pitting edema, cords, tenderness Neuro: no peripheral neuropathy. Otherwise nonfocal Skin without rash, ecchymosis, petechiae Portacath-without erythema or tenderness  Lab Results:  Results for orders placed or performed in visit on 10/05/14  CBC with Differential  Result Value Ref Range   WBC 1.5 (L) 3.9 - 10.3 10e3/uL   NEUT# 1.1 (L) 1.5 - 6.5 10e3/uL   HGB 9.9 (L) 11.6 - 15.9 g/dL   HCT 31.5 (L) 34.8 - 46.6 %   Platelets 186 145 - 400 10e3/uL   MCV 88.1 79.5 - 101.0 fL   MCH 27.7 25.1 - 34.0 pg   MCHC 31.5 31.5 - 36.0 g/dL   RBC 3.57 (L) 3.70 - 5.45 10e6/uL   RDW 19.5 (H) 11.2 - 14.5 %   lymph# 0.4 (L) 0.9 - 3.3 10e3/uL   MONO# 0.0 (L) 0.1 - 0.9 10e3/uL   Eosinophils Absolute 0.0 0.0 - 0.5 10e3/uL   Basophils Absolute 0.0 0.0 - 0.1 10e3/uL   NEUT% 73.0 38.4 - 76.8 %   LYMPH% 26.1 14.0 - 49.7 %   MONO% 0.6 0.0 - 14.0 %   EOS% 0.0 0.0 - 7.0 %   BASO% 0.3 0.0 - 2.0 %  Comprehensive metabolic panel (Cmet) - [facility]  Result Value Ref Range    Sodium 137 136 - 145 mEq/L   Potassium 4.6 3.5 - 5.1 mEq/L   Chloride 103 98 - 109 mEq/L   CO2 23 22 - 29 mEq/L   Glucose 139 70 - 140 mg/dl   BUN 11.8 7.0 - 26.0 mg/dL   Creatinine 0.8 0.6 - 1.1 mg/dL   Total Bilirubin 0.45 0.20 - 1.20 mg/dL   Alkaline Phosphatase 62 40 - 150 U/L   AST 15 5 - 34 U/L   ALT 23 0 - 55 U/L   Total Protein 7.4 6.4 - 8.3 g/dL   Albumin 3.8 3.5 - 5.0 g/dL   Calcium 9.4 8.4 - 10.4 mg/dL   Anion Gap 11 3 - 11 mEq/L   EGFR >90 >90 ml/min/1.73 m2    CA 125 available after visit down to 76, this having been 1182 on 09-07-14.   Studies/Results:  No results found.  Medications: I have reviewed the patient's current medications. Patient was confused about premed steroids for the different regimen, taking these last pm even tho no treatment due today; discussed and clarified. Neulasta given today.  DISCUSSION. She understands that we expect total alopecia  with this dose taxol. Medications as above. She is aware that she may have more aches with today's neulasta, and that counts may still drop transiently despite this, such that she should call if extremely fatigued or if symptoms of infection or bleeding. She will be due cycle 4 on 10-19-14, which I have suggested we keep on schedule as she is obviously responding, even tho she will not see Dr Denman George until 10-23-14.  Patient is glad to continue chemo now, and understands that Dr Denman George will give recommendations for chemo and surgery when she is seen.  We will let patient know CA 125 as above.  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 Granix needed day after each dose dense treatment due to leukopenia cycle 1. Carbo taxol dosing changed to q 3 week regimen beginning cycle 3 on 09-28-14, with neulasta today. Will chech CBC  when she is at [facility] for genetics appointment on 10-12-14. I will see her and give cycle 4 on 3-31 if counts adequate, with neulasta between day 5 and day 7. Scans late March and re-evaluation by Dr Denman George 10-23-14. 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 Up to date on mammograms. Genetics counseling and probably testing next week. 4.Pleural effusions and ascites clinically improved. 5.severe malnutrition in context of chronic illness: as above, [place] nutritionist following, po intake much better. Not clear how much weight she had really lost in several months prior to diagnosis due to ascites etc.  6.constipation resolved: tumor in proximity to rectum. Needs stool softeners and prn laxatives ongoing.  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 BP low in hospital after surgery, responded to IVF, vasotec and lasix resumed prior to DC, now again off lasix 8.GERD, oral candida likely from steroids, possibly esophageal candida: symptoms resolved with diflucan 9.post parathyroidectomy 2005 10.hypokalemia resolved off lasix, which she does not need to resume 11.hypothyroidism on replacement: TFTs in good range on present dose, patient aware and information sent to PCP 12 anemia: Continue oral iron as hemocyte or ferrous fumarate. Follow. Not more symptomatic. Note she has been treated extensively for breast cancer previously.    Patient understands discussion, instructions and plans. She will call prior to next scheduled visit if concerns.  Chemo orders and neulasta confirmed. Time spent 30 min including >50% counseling and coordination of  care.  LIVESAY,LENNIS P, MD   10/05/2014, 11:40 AM

## 2014-10-05 NOTE — Telephone Encounter (Signed)
Appointments made and avs printed for patient  Kathleen Bond

## 2014-10-05 NOTE — Progress Notes (Signed)
I gave foms to the nurse of Dr. Marko Plume. I will let the patient know.

## 2014-10-06 ENCOUNTER — Ambulatory Visit: Payer: 59

## 2014-10-06 LAB — CA 125: CA 125: 76 U/mL — AB (ref <35)

## 2014-10-12 ENCOUNTER — Ambulatory Visit: Payer: 59

## 2014-10-12 ENCOUNTER — Other Ambulatory Visit (HOSPITAL_BASED_OUTPATIENT_CLINIC_OR_DEPARTMENT_OTHER): Payer: 59

## 2014-10-12 ENCOUNTER — Other Ambulatory Visit: Payer: 59

## 2014-10-12 ENCOUNTER — Ambulatory Visit (HOSPITAL_BASED_OUTPATIENT_CLINIC_OR_DEPARTMENT_OTHER): Payer: 59 | Admitting: Genetic Counselor

## 2014-10-12 ENCOUNTER — Ambulatory Visit: Payer: 59 | Admitting: Nutrition

## 2014-10-12 ENCOUNTER — Encounter: Payer: Self-pay | Admitting: Genetic Counselor

## 2014-10-12 DIAGNOSIS — C569 Malignant neoplasm of unspecified ovary: Secondary | ICD-10-CM

## 2014-10-12 DIAGNOSIS — Z8052 Family history of malignant neoplasm of bladder: Secondary | ICD-10-CM

## 2014-10-12 DIAGNOSIS — Z315 Encounter for genetic counseling: Secondary | ICD-10-CM

## 2014-10-12 DIAGNOSIS — C50912 Malignant neoplasm of unspecified site of left female breast: Secondary | ICD-10-CM

## 2014-10-12 LAB — CBC WITH DIFFERENTIAL/PLATELET
BASO%: 0.6 % (ref 0.0–2.0)
Basophils Absolute: 0.2 10*3/uL — ABNORMAL HIGH (ref 0.0–0.1)
EOS%: 0.1 % (ref 0.0–7.0)
Eosinophils Absolute: 0 10*3/uL (ref 0.0–0.5)
HEMATOCRIT: 30.7 % — AB (ref 34.8–46.6)
HGB: 9.7 g/dL — ABNORMAL LOW (ref 11.6–15.9)
LYMPH%: 12.4 % — ABNORMAL LOW (ref 14.0–49.7)
MCH: 27.9 pg (ref 25.1–34.0)
MCHC: 31.6 g/dL (ref 31.5–36.0)
MCV: 88.3 fL (ref 79.5–101.0)
MONO#: 1.9 10*3/uL — ABNORMAL HIGH (ref 0.1–0.9)
MONO%: 6.8 % (ref 0.0–14.0)
NEUT#: 22.1 10*3/uL — ABNORMAL HIGH (ref 1.5–6.5)
NEUT%: 80.1 % — AB (ref 38.4–76.8)
Platelets: 264 10*3/uL (ref 145–400)
RBC: 3.48 10*6/uL — ABNORMAL LOW (ref 3.70–5.45)
RDW: 22.4 % — ABNORMAL HIGH (ref 11.2–14.5)
WBC: 27.6 10*3/uL — ABNORMAL HIGH (ref 3.9–10.3)
lymph#: 3.4 10*3/uL — ABNORMAL HIGH (ref 0.9–3.3)

## 2014-10-12 NOTE — Progress Notes (Signed)
Patient Name: Kathleen Bond Patient Age: 64 y.o. Encounter Date: 10/12/2014  Referring Physician: Evlyn Clines, MD  Primary Care Provider: Cleda Mccreedy, MD   Ms. Kathleen Bond, a 64 y.o. female, is being seen at the [facility] due to a personal history of both breast and ovarian cancer. She presents to clinic today to discuss the possibility of a hereditary predisposition to cancer and discuss whether genetic testing is warranted.  HISTORY OF PRESENT ILLNESS: Ms. Kathleen Bond was diagnosed with breast cancer at the age of 59 and ovarian cancer at age 69.  Her breast cancer was of the left breast (IDC) and was ER/PR negative, HER2 positive. She reported having a lumpectomy, radiation and chemotherapy.  [name] had a colonoscopy in 11/2011 where one rectal polyp was removed.  Oncology History         Ovarian cancer   08/15/2014 Initial Diagnosis Ovarian cancer    Primary cancer of left breast without evidence of regional lymph node metastasis (N0)   08/25/2014 Initial Diagnosis Primary cancer of left breast without evidence of regional lymph node metastasis (N0)    Past Medical History  Diagnosis Date  . Hypertension   . Breast cancer 2004    left breast lumpectomy  . Hypothyroidism     Past Surgical History  Procedure Laterality Date  . Left breast lumpectomy  2004  . Parathyroidectomy  2005  . Tubal ligation Bilateral   . Laparotomy N/A 08/15/2014    Procedure: EXPLORATORY LAPAROTOMY;  Surgeon: Everitt Amber, MD;  Location: [facility];  Service: Gynecology;  Laterality: N/A;  . Omentectomy N/A 08/15/2014    Procedure: biopsy of omentum;  Surgeon: Everitt Amber, MD;  Location: [facility];  Service: Gynecology;  Laterality: N/A;    History   Social History  . Marital Status: Single    Spouse Name: N/A  . Number of Children: N/A  . Years of Education: N/A   Social History Main Topics  . Smoking status: Never Smoker   . Smokeless tobacco: Never Used  . Alcohol  Use: Yes     Comment: occasional glass of wine   . Drug Use: No  . Sexual Activity: Not on file   Other Topics Concern  . Not on file   Social History Narrative     FAMILY HISTORY:   During the visit, a 4-generation pedigree was obtained. Family tree will be sent for scanning and will be in EPIC under the Media tab.  Significant diagnoses include the following:  Family History  Problem Relation Age of Onset  . Hypertension Mother   . Asthma Father   . Bladder Cancer Brother 50    deceased; smoker    Additionally, Kathleen Bond has one son (age 46). Another son died in a car accident. She has 5 living sisters (ages 38s-70s) who are all cancer-free. One sister died in her 36s, unrelated to cancer. Her mother (age 46) is cancer-free. Her mother had 4 sisters with no history of cancer in them or any of their children. Her father died in his 46s, cancer-free. She had little information about his siblings, but knew they were many with no history of cancers.  Kathleen Bond ancestry is Caucasian on her paternal side and African American on her maternal side. There is no known Jewish ancestry and no consanguinity.  ASSESSMENT AND PLAN: [name] is a 64 y.o. female with a personal history of breast and ovarian cancer. Her very large and negative family history suggests  that she does not likely have a hereditary predisposition to cancer. However, given that she had both cancers, genetic testing is recommended to rule out a mutation. We reviewed the characteristics, features and inheritance patterns of hereditary cancer syndromes. We also discussed the process of testing, insurance coverage and implications of results. A negative result will be reassuring.  Kathleen Bond wished to pursue genetic testing and a blood sample will be sent to [facility] for analysis of the 24 genes on the OvaNext gene panel. We discussed the implications of a positive, negative and/ or Variant of Uncertain  Significance (VUS) result. Results should be available in approximately 3-4 weeks, at which point we will contact her and address implications for her as well as address genetic testing for at-risk family members, if needed.    We encouraged Kathleen Bond to remain in contact with Cancer Genetics annually so that we can update the family history and inform her of any changes in cancer genetics and testing that may be of benefit for this family. Ms.  Bond questions were answered to her satisfaction today.   Thank you for the referral and allowing [place] to share in the care of your patient.   The patient was seen for a total of 35 minutes, greater than 50% of which was spent face-to-face counseling. This patient was discussed with the overseeing provider who agrees with the above.   Steele Berg, MS, [place] Certified Genetic Counselor phone: [phone] [email or web address]

## 2014-10-12 NOTE — Progress Notes (Signed)
Nutrition follow-up completed with patient being treated for ovarian cancer. Weight increased and documented as 114.9 pounds March 17, up from 110 pounds March 3. Patient reports she is eating more. Constipation has improved.  Nutrition diagnosis: Unintended weight loss improved.  Intervention: Patient educated to continue strategies for increased calories and protein. Recommended patient continue to eat 6 times daily. Teach back method used.  Monitoring, evaluation, goals: Patient will work to increase oral intake to minimize loss of lean body mass.  Next visit: To be scheduled as needed.  Patient has my contact information for questions or concerns  **Disclaimer: This note was dictated with voice recognition software. Similar sounding words can inadvertently be transcribed and this note may contain transcription errors which may not have been corrected upon publication of note.**

## 2014-10-13 ENCOUNTER — Ambulatory Visit: Payer: 59

## 2014-10-13 ENCOUNTER — Encounter: Payer: Self-pay | Admitting: Oncology

## 2014-10-13 NOTE — Progress Notes (Signed)
Patient advised ok to fax disab forms to [employer] [phone] on Friday 10/13/14. The forms indicated afte 10/13/14 but she said ok to do and let her know when done.

## 2014-10-15 ENCOUNTER — Other Ambulatory Visit: Payer: Self-pay | Admitting: Oncology

## 2014-10-16 ENCOUNTER — Encounter (HOSPITAL_COMMUNITY): Payer: Self-pay

## 2014-10-16 ENCOUNTER — Telehealth: Payer: Self-pay

## 2014-10-16 ENCOUNTER — Ambulatory Visit (HOSPITAL_COMMUNITY)
Admission: RE | Admit: 2014-10-16 | Discharge: 2014-10-16 | Disposition: A | Discharge status: Home / Self Care | Payer: 59 | Source: Ambulatory Visit | Attending: Oncology | Admitting: Oncology

## 2014-10-16 DIAGNOSIS — C569 Malignant neoplasm of unspecified ovary: Secondary | ICD-10-CM | POA: Diagnosis present

## 2014-10-16 HISTORY — DX: Malignant neoplasm of unspecified ovary: C56.9

## 2014-10-16 MED ORDER — IOHEXOL 300 MG/ML  SOLN
100.0000 mL | Freq: Once | INTRAMUSCULAR | Status: AC | PRN
  Administered 2014-10-16: 100 mL via INTRAVENOUS

## 2014-10-16 NOTE — Telephone Encounter (Signed)
Spoke with Ms. Kathleen Bond and she is taking the ferrous fumarate as directed.  She was prescribed medication on 09-21-14.  Told her she is ok for her treatment on 10-19-14 per labs on 10-12-14 as noted below  by Dr. Marko Plume.Reviewed times for decadron premed with patient. She verbalized understanding.

## 2014-10-16 NOTE — Telephone Encounter (Signed)
-----   Message from Gordy Levan, MD sent at 10/16/2014 10:27 AM EDT ----- Labs seen and need follow up: counts 3-24 look ok for treatment this week 3-31, will be repeated on day of rx. She is a little more anemic - be sure on hemocyte or ferrous fumarate usual instructions  thanks

## 2014-10-19 ENCOUNTER — Telehealth: Payer: Self-pay | Admitting: *Deleted

## 2014-10-19 ENCOUNTER — Other Ambulatory Visit (HOSPITAL_BASED_OUTPATIENT_CLINIC_OR_DEPARTMENT_OTHER): Payer: 59

## 2014-10-19 ENCOUNTER — Other Ambulatory Visit: Payer: 59

## 2014-10-19 ENCOUNTER — Encounter: Payer: Self-pay | Admitting: Oncology

## 2014-10-19 ENCOUNTER — Ambulatory Visit (HOSPITAL_BASED_OUTPATIENT_CLINIC_OR_DEPARTMENT_OTHER): Payer: 59 | Admitting: Oncology

## 2014-10-19 ENCOUNTER — Telehealth: Payer: Self-pay | Admitting: Oncology

## 2014-10-19 ENCOUNTER — Ambulatory Visit (HOSPITAL_BASED_OUTPATIENT_CLINIC_OR_DEPARTMENT_OTHER): Payer: 59

## 2014-10-19 VITALS — BP 137/86 | HR 112 | Temp 98.1°F | Resp 18 | Ht 62.0 in | Wt 119.2 lb

## 2014-10-19 DIAGNOSIS — C569 Malignant neoplasm of unspecified ovary: Secondary | ICD-10-CM

## 2014-10-19 DIAGNOSIS — D649 Anemia, unspecified: Secondary | ICD-10-CM | POA: Diagnosis not present

## 2014-10-19 DIAGNOSIS — C801 Malignant (primary) neoplasm, unspecified: Secondary | ICD-10-CM

## 2014-10-19 DIAGNOSIS — R188 Other ascites: Secondary | ICD-10-CM

## 2014-10-19 DIAGNOSIS — D509 Iron deficiency anemia, unspecified: Secondary | ICD-10-CM

## 2014-10-19 DIAGNOSIS — Z5111 Encounter for antineoplastic chemotherapy: Secondary | ICD-10-CM | POA: Diagnosis not present

## 2014-10-19 DIAGNOSIS — N6320 Unspecified lump in the left breast, unspecified quadrant: Secondary | ICD-10-CM

## 2014-10-19 DIAGNOSIS — Z853 Personal history of malignant neoplasm of breast: Secondary | ICD-10-CM

## 2014-10-19 DIAGNOSIS — E039 Hypothyroidism, unspecified: Secondary | ICD-10-CM

## 2014-10-19 DIAGNOSIS — J90 Pleural effusion, not elsewhere classified: Secondary | ICD-10-CM | POA: Diagnosis not present

## 2014-10-19 DIAGNOSIS — Z95828 Presence of other vascular implants and grafts: Secondary | ICD-10-CM

## 2014-10-19 DIAGNOSIS — R180 Malignant ascites: Secondary | ICD-10-CM

## 2014-10-19 DIAGNOSIS — C50912 Malignant neoplasm of unspecified site of left female breast: Secondary | ICD-10-CM

## 2014-10-19 DIAGNOSIS — T451X5A Adverse effect of antineoplastic and immunosuppressive drugs, initial encounter: Secondary | ICD-10-CM

## 2014-10-19 DIAGNOSIS — D701 Agranulocytosis secondary to cancer chemotherapy: Secondary | ICD-10-CM

## 2014-10-19 LAB — CBC WITH DIFFERENTIAL/PLATELET
BASO%: 0.4 % (ref 0.0–2.0)
Basophils Absolute: 0 10*3/uL (ref 0.0–0.1)
EOS%: 0 % (ref 0.0–7.0)
Eosinophils Absolute: 0 10*3/uL (ref 0.0–0.5)
HEMATOCRIT: 28.8 % — AB (ref 34.8–46.6)
HEMOGLOBIN: 9.3 g/dL — AB (ref 11.6–15.9)
LYMPH#: 0.8 10*3/uL — AB (ref 0.9–3.3)
LYMPH%: 10.2 % — ABNORMAL LOW (ref 14.0–49.7)
MCH: 28.8 pg (ref 25.1–34.0)
MCHC: 32.2 g/dL (ref 31.5–36.0)
MCV: 89.5 fL (ref 79.5–101.0)
MONO#: 0 10*3/uL — ABNORMAL LOW (ref 0.1–0.9)
MONO%: 0.4 % (ref 0.0–14.0)
NEUT#: 7.1 10*3/uL — ABNORMAL HIGH (ref 1.5–6.5)
NEUT%: 89 % — AB (ref 38.4–76.8)
Platelets: 309 10*3/uL (ref 145–400)
RBC: 3.21 10*6/uL — ABNORMAL LOW (ref 3.70–5.45)
RDW: 21.6 % — ABNORMAL HIGH (ref 11.2–14.5)
WBC: 7.9 10*3/uL (ref 3.9–10.3)

## 2014-10-19 LAB — COMPREHENSIVE METABOLIC PANEL (CC13)
ALT: 12 U/L (ref 0–55)
AST: 8 U/L (ref 5–34)
Albumin: 3.4 g/dL — ABNORMAL LOW (ref 3.5–5.0)
Alkaline Phosphatase: 81 U/L (ref 40–150)
Anion Gap: 15 mEq/L — ABNORMAL HIGH (ref 3–11)
BUN: 23.3 mg/dL (ref 7.0–26.0)
CO2: 21 meq/L — AB (ref 22–29)
Calcium: 9.5 mg/dL (ref 8.4–10.4)
Chloride: 104 mEq/L (ref 98–109)
Creatinine: 0.7 mg/dL (ref 0.6–1.1)
EGFR: >90 mL/min/{1.73_m2} (ref >90)
GLUCOSE: 207 mg/dL — AB (ref 70–140)
Potassium: 4 mEq/L (ref 3.5–5.1)
SODIUM: 141 meq/L (ref 136–145)
TOTAL PROTEIN: 7.8 g/dL (ref 6.4–8.3)
Total Bilirubin: 0.27 mg/dL (ref 0.20–1.20)

## 2014-10-19 MED ORDER — DIPHENHYDRAMINE HCL 50 MG/ML IJ SOLN
25.0000 mg | Freq: Once | INTRAMUSCULAR | Status: AC
  Administered 2014-10-19: 25 mg via INTRAVENOUS

## 2014-10-19 MED ORDER — SODIUM CHLORIDE 0.9 % IV SOLN
Freq: Once | INTRAVENOUS | Status: AC
  Administered 2014-10-19: 09:00:00 via INTRAVENOUS
  Filled 2014-10-19: qty 8

## 2014-10-19 MED ORDER — DEXAMETHASONE 4 MG PO TABS: ORAL_TABLET | ORAL | Status: DC

## 2014-10-19 MED ORDER — DIPHENHYDRAMINE HCL 50 MG/ML IJ SOLN
INTRAMUSCULAR | Status: AC
  Filled 2014-10-19: qty 1

## 2014-10-19 MED ORDER — FAMOTIDINE IN NACL 20-0.9 MG/50ML-% IV SOLN
INTRAVENOUS | Status: AC
  Filled 2014-10-19: qty 50

## 2014-10-19 MED ORDER — PACLITAXEL CHEMO INJECTION 300 MG/50ML
175.0000 mg/m2 | Freq: Once | INTRAVENOUS | Status: AC
  Administered 2014-10-19: 258 mg via INTRAVENOUS
  Filled 2014-10-19: qty 43

## 2014-10-19 MED ORDER — FAMOTIDINE IN NACL 20-0.9 MG/50ML-% IV SOLN
20.0000 mg | Freq: Once | INTRAVENOUS | Status: AC
  Administered 2014-10-19: 20 mg via INTRAVENOUS

## 2014-10-19 MED ORDER — TRAMADOL HCL 50 MG PO TABS: 100.0000 mg | ORAL_TABLET | Freq: Four times a day (QID) | ORAL | Status: DC

## 2014-10-19 MED ORDER — SODIUM CHLORIDE 0.9 % IV SOLN
417.5000 mg | Freq: Once | INTRAVENOUS | Status: AC
  Administered 2014-10-19: 420 mg via INTRAVENOUS
  Filled 2014-10-19: qty 42

## 2014-10-19 MED ORDER — HEPARIN SOD (PORK) LOCK FLUSH 100 UNIT/ML IV SOLN
500.0000 [IU] | Freq: Once | INTRAVENOUS | Status: AC | PRN
  Administered 2014-10-19: 500 [IU]
  Filled 2014-10-19: qty 5

## 2014-10-19 MED ORDER — SODIUM CHLORIDE 0.9 % IV SOLN
Freq: Once | INTRAVENOUS | Status: AC
  Administered 2014-10-19: 09:00:00 via INTRAVENOUS

## 2014-10-19 MED ORDER — SODIUM CHLORIDE 0.9 % IJ SOLN
10.0000 mL | INTRAMUSCULAR | Status: DC | PRN
  Administered 2014-10-19: 10 mL
  Filled 2014-10-19: qty 10

## 2014-10-19 NOTE — Telephone Encounter (Signed)
Appointments made and patient will get a new schedule in chemo

## 2014-10-19 NOTE — Telephone Encounter (Signed)
Per staff message and POF I have scheduled appts. Advised scheduler of appts. JMW  

## 2014-10-19 NOTE — Progress Notes (Signed)
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 Initial 2 cycles used dose dense regimen, which was changed to q 3 week dosing with cycle 3. She requires g CSF support. Restaging CT CAP was done 10-16-14, confirming clinical impression of significant improvement with chemotherapy (see below);there is a question about area lateral left breast (note history left breast ca).  CA 125 on 08-02-14 was 1162, this down to 76 on 10-05-14. She is to see Dr Denman George on 10-23-14.   With continuing improvement, she will have cycle 4 on schedule today, even prior to Dr Serita Grit appointment next week. She will have neulasta day of Dr Serita Grit appointment.  Patient has felt generally well, with good appetite and no taste changes. She cannot tell abdominal distension and denies abdominal or pelvic pain. Bowels are moving with soft stool daily. She has no peripheral neuropathy. She is fatigued due to lack of sleep from situation with elderly mother. She has noticed some tenderness lateral to left breast, no other changes in breasts. Last mammograms were at [facility] ~ Aug or Sept 2015.        PAC in Flu vaccine done genetics testing with OvaNext panel sent 10-12-14 and pending  Patient has applied for disability, but is concerned about covering expenses in interim and would like to speak with [facility] financial staff about assistance. Patient's 62 yo mother, with whom she lives, has been in hospital and is not sleeping. Patient's siblings also assist with care, however situation is stressful.   ONCOLOGIC HISTORY Gyn cancer: Patient became symptomatic with abdominal swelling and constipation in  late Nov 2015, seen and treated initially at [facility] ED and by PCP Dr Cleda Mccreedy for what was thought to be ulcer. Symptoms continued, with early satiety causing poor po intake; she was seen again at [facility] ED in early Jan 2016, with CT reportedly consistent with stage IIIC or IV gyn cancer, including omental caking, large pelvic mass, para-aortic adenopathy, ascites, pleural effusions and right subpleural nodule. She was seen in consultation by Dr Denman George on 07-25-14, with concern from her exam and scan for rectal involvement. 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 She had paracentesis of 3.4 liters ascites on 07-27-2014, with cytology ([id number]) showing serous carcinoma consistent with gyn primary. She had right thoracentesis for 350 cc on 08-11-14, this requested by anesthesia prior to surgery and apparently not sent for cytology. Patient was more comfortable after paracentesis, but could not tell any difference in breathing after thoracentesis (denies SOB prior to that procedure). CA 125 on 08-02-14 was 1162. She was taken to exploratory laparotomy by Dr Denman George on 08-15-14, with 4 liters of ascites removed and biopsy of omentum obtained, with additional debulking not attempted due to surgical findings of entire peritoneal surface replaced by tumor plaque, small intestine coated with tumor and mesentery tethered with tumor nodules, ileum adherent to pelvic mass, dense plaque of tumor on right diaphragm and 10 cm omental cake in LUQ. Pathology of omental biopsy She needed another paracentesis by IR for 1.3 liters ascites on 08-17-14, and had PAC placed by IR also 08-17-14. Day 1 cycle 1 dose dense carboplatin  taxol was given in hospital on 08-18-14. After 2 cycles of dose dense regimen, the carboplatin and taxol was changed to every 3 week regimen beginning cycle 3. She has needed gCSF support.  Breast cancer: Left breast biopsy 11-22-2003  ([id number]) invasive carcinoma. Left breast wide excision with sentinel nodes and axillary contents by Dr Benson Norway 01-03-2004 [id number]) poorly differentiated invasive ductal carcinoma 3.0 cm with closest margin 1.7 cm deep, 6 sentinel nodes + 3 additional nodes all negative, ER 0, PR 0, HER 2 3+, Ki67 of 19%. . She received adjuvant chemotherapy by Dr Sonny Dandy with 5FU, epirubicin, cytoxan x 4 cycles from 02-13-2004 thru 04-16-2004, followed by taxol 80 mg/m2 weekly x 11 from 05-07-2004 thru 09-04-2004, and herceptin 05-21-2004 thru 02-25-2005. Antiemetics were Kytril and decadron. She had PAC for the chemotherapy. She had radiation by Dr Tyler Pita in [place]. Last note from Dr Sonny Dandy in this information was 04-2008. Last bilateral mammograms reportedly at [facility] ~ Aug - Sept 2015.    Review of systems as above, also: No fever or symptoms of infection. No bladder symptoms. No bleeding. No problems with PAC. No SOB with somewhat limited activity. Remainder of 10 point Review of Systems negative.  Objective:  Vital signs in last 24 hours: Weight up 5 lbs to 119. BP 137/86, HR 112 regular, resp 18 not labored RA, temp 98.1  Alert, oriented and appropriate. Ambulatory without difficulty.  Alopecia  HEENT:PERRL, sclerae not icteric. Oral mucosa moist without lesions, posterior pharynx clear.  Neck supple. No JVD.  Lymphatics:no cervical,supraclavicular, axillary or inguinal adenopathy Resp: clear to auscultation bilaterally and normal percussion bilaterally Cardio: regular rate and rhythm. No gallop. GI: soft, nontender, not distended, no organomegaly. Normally active bowel sounds. Surgical incision not remarkable. Musculoskeletal/ Extremities: without pitting edema, cords, tenderness Neuro: no peripheral neuropathy. Otherwise nonfocal. PSYCH appropriate mood and affect Skin without rash, ecchymosis, petechiae Breasts: Left with well healed lumpectomy scar and radiation  tattoos, without dominant mass, skin or nipple findings. No tenderness to palpation including laterally and into axilla. Axillae benign. No swelling LUE.  Portacath-without erythema or tenderness  Lab Results:  Results for orders placed or performed in visit on 10/19/14  CBC with Differential/Platelet  Result Value Ref Range   WBC 7.9 3.9 - 10.3 10e3/uL   NEUT# 7.1 (H) 1.5 - 6.5 10e3/uL   HGB 9.3 (L) 11.6 - 15.9 g/dL   HCT 28.8 (L) 34.8 - 46.6 %   Platelets 309 145 - 400 10e3/uL   MCV 89.5 79.5 - 101.0 fL   MCH 28.8 25.1 - 34.0 pg   MCHC 32.2 31.5 - 36.0 g/dL   RBC 3.21 (L) 3.70 - 5.45 10e6/uL   RDW 21.6 (H) 11.2 - 14.5 %   lymph# 0.8 (L) 0.9 - 3.3 10e3/uL   MONO# 0.0 (L) 0.1 - 0.9 10e3/uL   Eosinophils Absolute 0.0 0.0 - 0.5 10e3/uL   Basophils Absolute 0.0 0.0 - 0.1 10e3/uL   NEUT% 89.0 (H) 38.4 - 76.8 %   LYMPH% 10.2 (L) 14.0 - 49.7 %   MONO% 0.4 0.0 - 14.0 %   EOS% 0.0 0.0 - 7.0 %   BASO% 0.4 0.0 - 2.0 %    CMET today essentially normal with exception of glucose 207 on steroids for taxol, alb 3.4.  Studies/Results: EXAM: CT CHEST, ABDOMEN, AND PELVIS WITH CONTRAST  10-16-14  TECHNIQUE: Multidetector CT imaging of the chest, abdomen and pelvis was performed following the standard protocol during bolus administration of  intravenous contrast.  CONTRAST: 117m OMNIPAQUE IOHEXOL 300 MG/ML SOLN  COMPARISON: Abdominal pelvic CT 07/21/2014.  FINDINGS: CT CHEST FINDINGS  Mediastinum/Nodes: There are no enlarged mediastinal, hilar or axillary lymph nodes. There are postsurgical changes within the left axilla with an irregular mass posterolaterally in the left breast, measuring 2.9 x 1.7 cm on image 28. The thyroid gland, trachea and esophagus demonstrate no significant findings. The heart size is normal. There is no pericardial effusion.There is mild atherosclerosis of the aorta, great vessels and coronary arteries. Right IJ Port-A-Cath tip is near the SVC right  atrial junction.  Lungs/Pleura: There is no pleural effusion.Dependent atelectasis or scarring in both bases appears primarily linear on the reformatted images. No confluent airspace opacity or suspicious pulmonary nodule.  Musculoskeletal/Chest wall: As above, there is concern of a recurrent mass posterolaterally in the left breast. This could reflect postsurgical scarring, although appears slightly larger than on prior abdominal CT and demonstrates peripheral enhancement. No suspicious osseous findings.  CT ABDOMEN AND PELVIS FINDINGS  Hepatobiliary: The liver is normal in density without focal abnormality. Multiple calcified gallstones noted. No significant gallbladder wall thickening or biliary dilatation.  Pancreas: Unremarkable. No pancreatic ductal dilatation or surrounding inflammatory changes.  Spleen: Normal in size without focal abnormality.  Adrenals/Urinary Tract: Both adrenal glands appear normal.The kidneys appear normal without evidence of urinary tract calculus, suspicious lesion or hydronephrosis. No bladder abnormalities are seen.  Stomach/Bowel: No evidence of bowel wall thickening, distention or surrounding inflammatory change.The appendix appears normal. The previously demonstrated ascites has nearly completely resolved. There is a small amount residual loculated fluid anteriorly in the left abdomen. There is also mild residual omental nodularity in the left upper quadrant.  Vascular/Lymphatic: Previously demonstrated retroperitoneal and mesenteric adenopathy has resolved. Mild aortoiliac atherosclerosis noted.  Reproductive: Complex solid and cystic pelvic mass has decreased in size and may reflect bilateral ovarian lesions. The right-sided component measures approximately 3.5 x 4.4 cm on image 94 and a left-sided component approximately 5.2 x 4.9 cm on image 95.  Other: Postsurgical changes within the low anterior abdominal wall. No  evidence of abdominal wall hernia or mass.  Musculoskeletal: No acute or significant osseous findings.  IMPRESSION: 1. Significant response to interval therapy. The large complex solid and cystic pelvic mass has decreased in size, and the adenopathy, ascites and peritoneal nodularity have nearly completely resolved. 2. No evidence of disease progression, bowel or ureteral obstruction. 3. Possible recurrent posterior left breast/chest wall mass versus postoperative fibrosis. Comparison is limited. The patient underwent mammography and breast ultrasound 10 months ago; repetition of those examinations now should be considered to exclude chest wall Recurrence.    CT information above and PACS images reviewed with patient at visit.    Medications: I have reviewed the patient's current medications. She is tolerating Hemocyte on empty stomach with OJ daily, this begun 09-21-14.   DISCUSSION:  Response to chemotherapy for gyn cancer thus far discussed, including CT information suggesting this may be ovarian primary. She understands that Dr RDenman Georgewill consider possible interval debulking, tho she may recommend further chemo prior.  Area in lateral left breast/ axilla seen on this CT discussed - this area was incompletely imaged on previous CT AP and may be scarring related to previous treatment of left breast ca. [facility]. Financial counselors not available to speak with her today, however I have requested appointment with them to look into other assistance.  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 Carbo taxol was given as dose dense regimen first 2 cycles with granix, then changed to q 3 week regimen beginning  cycle 3, with neulasta. CT CAP 10-16-14 with good partial response thus far.  Cycle 4 today, with neulasta on 10-23-14 when she has follow up with Dr Denman George. Possible interval debulking per Dr Denman George. Genetics sent and pending. 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 Questioned area lateral left breast on new CTs, with some new tenderness there past few weeks. For diagnostic left mammogram at [facility]. Genetics testing pending. 4.Pleural effusions and ascites clinically improved. Ascites had + cytology, pleural fluid not sent for cytology  5.nutritional status and po intake much improved. [place] has assisted. 6.constipation resolved: tumor in proximity to rectum. 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 BP low in hospital after surgery, responded to IVF, vasotec and lasix resumed prior to DC, now again off lasix 8.anemia: Continue oral iron as hemocyte or ferrous fumarate. Follow. Not more symptomatic.  9.post parathyroidectomy 2005 10.hypokalemia resolved off lasix 11.hypothyroidism on replacement: TFTs in good range on present dose, patient aware and information sent to PCP .12.social stress as she lives with 25 yo mother who is not sleeping at night and has financial concerns. Plan as above  All questions answered. Chemo and neulasta orders confirmed. CC this note to Dr Denman George and PCP Dr Eula Fried. Time spent 30 min including >50% counseling and coordination of care.    Macaria Bias P, MD   10/19/2014, 8:10 AM

## 2014-10-19 NOTE — Patient Instructions (Signed)
[facility] Discharge Instructions for Patients Receiving Chemotherapy  Today you received the following chemotherapy agents Taxol and Carboplatin.  To help prevent nausea and vomiting after your treatment, we encourage you to take your nausea medication as prescribed.   If you develop nausea and vomiting that is not controlled by your nausea medication, call the clinic.   BELOW ARE SYMPTOMS THAT SHOULD BE REPORTED IMMEDIATELY:  *FEVER GREATER THAN 100.5 F  *CHILLS WITH OR WITHOUT FEVER  NAUSEA AND VOMITING THAT IS NOT CONTROLLED WITH YOUR NAUSEA MEDICATION  *UNUSUAL SHORTNESS OF BREATH  *UNUSUAL BRUISING OR BLEEDING  TENDERNESS IN MOUTH AND THROAT WITH OR WITHOUT PRESENCE OF ULCERS  *URINARY PROBLEMS  *BOWEL PROBLEMS  UNUSUAL RASH Items with * indicate a potential emergency and should be followed up as soon as possible.  Feel free to call the clinic you have any questions or concerns. The clinic phone number is [phone].  Please show the CHEMO ALERT CARD at check-in to the Emergency Department and triage nurse.   

## 2014-10-20 ENCOUNTER — Telehealth: Payer: Self-pay

## 2014-10-20 ENCOUNTER — Telehealth: Payer: Self-pay | Admitting: Oncology

## 2014-10-20 NOTE — Telephone Encounter (Signed)
Faxed order with [facility] as noted below by Dr. Marko Plume.

## 2014-10-20 NOTE — Telephone Encounter (Signed)
-----   Message from Gordy Levan, MD sent at 10/20/2014  9:02 AM EDT ----- I have set up the left diagnostic mammogram at [facility].   Could you please fax order for left diagnostic mammogram to [facility] at [facility] [phone], attn radiology scheduler. With the order, please fax report of CT chest 10-16-14 and write on cover sheet "concern left lateral breast by CT chest, report included with this fax"  Please let patient know mammogram set up for Wed 10-25-14 at 3:40. Arrive a few minutes early, no deodorant or powder.  thanks

## 2014-10-20 NOTE — Telephone Encounter (Signed)
[facility] to confirm that this office had been able to schedule diagnostic left mammogram; could not reach person thru phone tree for central scheduling or [facility], so went thru hospital operator to reach radiology scheduler. As no diagnostic left mammogram set up yet, I gave order for this, which can be done on 10-25-14 at 3:40 PM, patient to arrive shortly prior to 3:40 at [facility].  Will need to fax order for the left diagnostic mammogram to [phone] attention radiology scheduler. Will fax report of CT chest 10-16-14 with the order, as concern noted on that scan. Message to RN re order, CT chest report and to let patient know.  Godfrey Pick, MD

## 2014-10-20 NOTE — Telephone Encounter (Signed)
Spoke with Kathleen Bond and told her the appointment date for mammogram for 10-25-14 at 1540. Arrive at 1530 and use no powder or deodorant.  Pt. Verbalized understanding and wrote information down as noted.

## 2014-10-23 ENCOUNTER — Ambulatory Visit (HOSPITAL_BASED_OUTPATIENT_CLINIC_OR_DEPARTMENT_OTHER): Payer: 59

## 2014-10-23 ENCOUNTER — Ambulatory Visit: Payer: 59

## 2014-10-23 ENCOUNTER — Ambulatory Visit: Payer: 59 | Attending: Gynecologic Oncology | Admitting: Gynecologic Oncology

## 2014-10-23 ENCOUNTER — Encounter: Payer: Self-pay | Admitting: Gynecologic Oncology

## 2014-10-23 VITALS — BP 114/83 | HR 70 | Temp 98.5°F | Resp 18 | Ht 62.0 in | Wt 117.3 lb

## 2014-10-23 DIAGNOSIS — C801 Malignant (primary) neoplasm, unspecified: Secondary | ICD-10-CM | POA: Diagnosis not present

## 2014-10-23 DIAGNOSIS — C569 Malignant neoplasm of unspecified ovary: Secondary | ICD-10-CM | POA: Diagnosis not present

## 2014-10-23 DIAGNOSIS — Z5189 Encounter for other specified aftercare: Secondary | ICD-10-CM

## 2014-10-23 MED ORDER — PEGFILGRASTIM INJECTION 6 MG/0.6ML ~~LOC~~
6.0000 mg | PREFILLED_SYRINGE | Freq: Once | SUBCUTANEOUS | Status: AC
  Administered 2014-10-23: 6 mg via SUBCUTANEOUS
  Filled 2014-10-23: qty 0.6

## 2014-10-23 NOTE — Patient Instructions (Signed)
Preparing for your Surgery  Plan for surgery on May 10 with Dr. Denman George.  Pre-operative Testing -You will receive a phone call from presurgical testing at [facility] to arrange for a pre-operative testing appointment before your surgery.  This appointment normally occurs one to two weeks before your scheduled surgery.   -Bring your insurance card, copy of an advanced directive if applicable, medication list  -At that visit, you will be asked to sign a consent for a possible blood transfusion in case a transfusion becomes necessary during surgery.  The need for a blood transfusion is rare but having consent is a necessary part of your care.     -You should not be taking blood thinners or aspirin at least ten days prior to surgery unless instructed by your surgeon.  Day Before Surgery at [place] will be asked to take in only clear liquids the day before surgery.  Examples of clear liquids include broths, jello, and clear juices.  Plan to drink one bottle of magnesium citrate the day before surgery.  You will be advised to have nothing to eat or drink after midnight the evening before.    Your role in recovery Your role is to become active as soon as directed by your doctor, while still giving yourself time to heal.  Rest when you feel tired. You will be asked to do the following in order to speed your recovery:  - Cough and breathe deeply. This helps toclear and expand your lungs and can prevent pneumonia. You may be given a spirometer to practice deep breathing. A staff member will show you how to use the spirometer. - Do mild physical activity. Walking or moving your legs help your circulation and body functions return to normal. A staff member will help you when you try to walk and will provide you with simple exercises. Do not try to get up or walk alone the first time. - Actively manage your pain. Managing your pain lets you move in comfort. We will ask you to rate  your pain on a scale of zero to 10. It is your responsibility to tell your doctor or nurse where and how much you hurt so your pain can be treated.  Special Considerations -If you are diabetic, you may be placed on insulin after surgery to have closer control over your blood sugars to promote healing and recovery.  This does not mean that you will be discharged on insulin.  If applicable, your oral antidiabetics will be resumed when you are tolerating a solid diet.  -Your final pathology results from surgery should be available by the Friday after surgery and the results will be relayed to you when available.  Blood Transfusion Information WHAT IS A BLOOD TRANSFUSION? A transfusion is the replacement of blood or some of its parts. Blood is made up of multiple cells which provide different functions.  Red blood cells carry oxygen and are used for blood loss replacement.  White blood cells fight against infection.  Platelets control bleeding.  Plasma helps clot blood.  Other blood products are available for specialized needs, such as hemophilia or other clotting disorders. BEFORE THE TRANSFUSION  Who gives blood for transfusions?   You may be able to donate blood to be used at a later date on yourself (autologous donation).  Relatives can be asked to donate blood. This is generally not any safer than if you have received blood from a stranger. The same precautions are taken to ensure  safety when a relative's blood is donated.  Healthy volunteers who are fully evaluated to make sure their blood is safe. This is blood bank blood. Transfusion therapy is the safest it has ever been in the practice of medicine. Before blood is taken from a donor, a complete history is taken to make sure that person has no history of diseases nor engages in risky social behavior (examples are intravenous drug use or sexual activity with multiple partners). The donor's travel history is screened to minimize risk  of transmitting infections, such as malaria. The donated blood is tested for signs of infectious diseases, such as HIV and hepatitis. The blood is then tested to be sure it is compatible with you in order to minimize the chance of a transfusion reaction. If you or a relative donates blood, this is often done in anticipation of surgery and is not appropriate for emergency situations. It takes many days to process the donated blood. RISKS AND COMPLICATIONS Although transfusion therapy is very safe and saves many lives, the main dangers of transfusion include:   Getting an infectious disease.  Developing a transfusion reaction. This is an allergic reaction to something in the blood you were given. Every precaution is taken to prevent this. The decision to have a blood transfusion has been considered carefully by your caregiver before blood is given. Blood is not given unless the benefits outweigh the risks.

## 2014-10-23 NOTE — Progress Notes (Signed)
OVARIAN CANCER FOLLOWUP VISIT  HPI:  Kathleen Bond is a 64 y.o. year old G54 initially seen in consultation on 07/25/14 for stage IIIC ovarian cancer.  She then underwent an exploratory laparotomy and omental biopsy on 08/15/14. It was complicated by inability to complete a debulking procedure due to extensive peritoneal involvement of small and large intestine and complete peritoneal involvement with tumor.  Her postoperative course was uncomplicated and she was started on cycle 1 of paclitaxel and carboplatin on 08/18/14.  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  She went on to receive 4 cycles of neoadjuvant chemotherapy with paclitaxel and carboplatin (cycle 4 on 10/19/14).  CA 125 assessments with each cycle have shown considerable positive response (from 1162 upon commencement, to 76 on 10/05/14 after cycle 3).  CT of the chest, abdomen and pelvis on 10/16/14 revealed good response to therapy with significant decrease in pelvic mass size (5cm), resolution of ascites and lymphadenopathy, persistent but signifcantly reduced omental cake. The CT did show a left breast increased density. She feels it is the same from post-treatment for breast cancer. She is having a followup mammogram in 1 week.  Interval history: She is tolerating chemotherapy very well. Denies neuropathy. She has good energy level. ECOG performance status 0.  She is eating well and gaining weight.   Review of systems: Constitutional:  She has no weight gain or weight loss. She has no fever or chills. Eyes: No blurred vision Ears, Nose, Mouth, Throat: No dizziness, headaches or changes in hearing. No mouth sores. Cardiovascular: No chest pain, palpitations or edema. Respiratory:  No shortness of breath, wheezing or cough Gastrointestinal: She has normal bowel movements without diarrhea or constipation. She denies any nausea or vomiting. She denies blood in her stool or heart burn. Genitourinary:  She  denies pelvic pain, pelvic pressure or changes in her urinary function. She has no hematuria, dysuria, or incontinence. She has no irregular vaginal bleeding or vaginal discharge Musculoskeletal: Denies muscle weakness or joint pains.  Skin:  She has no skin changes, rashes or itching Neurological:  Denies dizziness or headaches. No neuropathy, no numbness or tingling. Psychiatric:  She denies depression or anxiety. Hematologic/Lymphatic:   No easy bruising or bleeding   Physical Exam: Blood pressure 114/83, pulse 70, temperature 98.5 F (36.9 C), temperature source Oral, resp. rate 18, height 5\' 2"  (1.575 m), weight 117 lb 4.8 oz (53.207 kg). General: Well dressed, well nourished in no apparent distress.   HEENT:  Normocephalic and atraumatic, no lesions.  Extraocular muscles intact. Sclerae anicteric. Pupils equal, round, reactive. No mouth sores or ulcers. Thyroid is normal size, not nodular, midline. Skin:  No lesions or rashes. Breasts:  Firmness left breast consistent with CT findings. Not well circumscribed. Lungs:  Clear to auscultation bilaterally.  No wheezes. Cardiovascular:  Regular rate and rhythm.  No murmurs or rubs. Abdomen:  Soft, nontender, nondistended.  No palpable masses.  No hepatosplenomegaly.  No ascites. Normal bowel sounds.  No hernias.  Incision is healed. No infection. Genitourinary: Normal EGBUS  Vaginal cuff intact.  No bleeding or discharge.  No cul de sac fullness. Rectal exam reveals free with no nodularity. Significant improvement in tethered/narrowed exam from pre-chemotherapy. Extremities: No cyanosis, clubbing or edema.  No calf tenderness or erythema. No palpable cords. Psychiatric: Mood and affect are appropriate. Neurological: Awake, alert and oriented x 3. Sensation is intact, no neuropathy.  Musculoskeletal: No pain, normal strength and range of motion.  Assessment:  64 y.o. 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   S/p  exploratory laparotomy and omental biopsy on 08/15/14. S/p 4 cycles neoadjuvant chemotherapy with excellent clinical response (incomplete).  Plan: 1) Plan for interval cytoreductive effort. Will plan for this 3 weeks after cycle 5 (on May 10th). Will check CBC within 3 days preop to ensure normalization in platelets and WBC. + bowel prep with mag citrate. 2) continue cycle 5 (scheduled for April 21st) as planned. 3) discussed need for additional chemotherapy postop (at least 3 additional cycles).  [name], MD

## 2014-11-02 ENCOUNTER — Encounter: Payer: Self-pay | Admitting: Genetic Counselor

## 2014-11-02 DIAGNOSIS — Z1501 Genetic susceptibility to malignant neoplasm of breast: Secondary | ICD-10-CM | POA: Insufficient documentation

## 2014-11-02 DIAGNOSIS — Z1509 Genetic susceptibility to other malignant neoplasm: Secondary | ICD-10-CM

## 2014-11-02 DIAGNOSIS — Z1379 Encounter for other screening for genetic and chromosomal anomalies: Secondary | ICD-10-CM | POA: Insufficient documentation

## 2014-11-02 NOTE — Progress Notes (Signed)
GENETIC TEST RESULTS   Patient Name: Kathleen Bond Patient Age: 64 y.o. Encounter Date: 11/02/2014  Referring Provider: Evlyn Clines, MD    Kathleen Bond was seen in the [facility] clinic on 10/12/14 due to a personal history of cancer and concern regarding a hereditary predisposition to cancer in the family. Please refer to the prior Genetics clinic note for more information regarding Kathleen Bond's medical and family histories and our assessment at the time.   GENETIC TESTING:  At the time of Kathleen Bond's visit, we recommended she pursue genetic testing of the 24 genes on the OvaNext gene panel. This test, which included sequencing and deletion/duplication analysis, was performed at [facility]. 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  MEDICAL MANAGEMENT: Given Kathleen Bond' current metastatic cancer, the information regarding screening for a new primary are not applicable to her at this time. The recommendations below from the NCCN that are specific to women with a BRCA mutation and were provided to her for information for her relatives who may test positive.  Breast Cancer:  - Starting at age 95: Breast awareness - Women should be familiar with their breasts and promptly report changes to their healthcare provider. Performing regular breast self exams may help increase breast awareness, especially when checked at the end of the menstrual cycle.  - Starting at age 76: Clinical breast exam every 6-12 months. - Between ages 25-29 or individualized based on family history: Breast MRI screening (preferred) every year or mammogram if MRI is unavailable. - Between ages 67-75: Mammogram and breast MRI screening every year.  - Option of risk-reducing bilateral mastectomies  Ovarian Cancer: - Recommend risk-reducing salpingo-oophorectomy (RRSO), typically between 68 and 36 y, and upon completion of child bearing. Because ovarian cancer onset in patients  with BRCA2 mutations is an average of 8-10 years later than in patients with BRCA1 mutations, it is reasonable to delay RRSO until age 32-45 y in patients with BRCA2 mutations who have already maximized their breast cancer prevention (ie, undergone bilateral mastectomy).  - While there may be circumstances where ovarian cancer screening with transvaginal ultrasound and a blood test for a protein called CA-125 are helpful, these techniques have not been shown to be effective in detecting early ovarian cancer and are generally not recommended.  - Consider risk reduction agents as options for breast and ovarian cancer, including discussing risks and benefits.  Other Cancers: We discussed that while literature has shown other cancers, such as prostate cancer, pancreatic cancer and melanoma, to be associated with BRCA2 mutations, national guidelines do not currently recommend any specific screenings for these cancers. Screening may be individualized based on cancers observed in the family.  FAMILY MEMBERS: It is important that all of Kathleen Bond's relatives (both men and women) know of the presence of this gene mutation. Site-specific genetic testing can sort out who in the family is at risk and who is not. Kathleen Bond son and siblings have a 50% chance to have inherited this mutation. We recommend they have genetic testing for this same mutation, as identifying the presence of this mutation would allow them to also take advantage of risk-reducing measures.   SUPPORT AND RESOURCES: If Kathleen Bond is interested in BRCA-specific information and support, there are two groups, Facing Our Risk (www.facingourrisk.com) and Bright Pink (www.brightpink.org) which some people have found useful. They provide opportunities to speak with other individuals from high-risk families. To locate genetic counselors in other cities, visit the website of  the [employer] ([employer]) and search for a  counselor by zip code.  We encouraged Kathleen Bond to remain in contact with [place] on an annual basis so we can update her personal and family histories, and let her know of advances in cancer genetics that may benefit the family. Our contact number was provided. Kathleen Bond questions were answered to her satisfaction today, and she knows she is welcome to call anytime with additional questions.    Steele Berg, MS, [place] Certified Genetic Counselor phone: [phone] [email or web address]

## 2014-11-05 ENCOUNTER — Other Ambulatory Visit: Payer: Self-pay | Admitting: Oncology

## 2014-11-05 DIAGNOSIS — C569 Malignant neoplasm of unspecified ovary: Secondary | ICD-10-CM

## 2014-11-09 ENCOUNTER — Encounter: Payer: Self-pay | Admitting: Oncology

## 2014-11-09 ENCOUNTER — Ambulatory Visit (HOSPITAL_BASED_OUTPATIENT_CLINIC_OR_DEPARTMENT_OTHER): Payer: 59

## 2014-11-09 ENCOUNTER — Ambulatory Visit: Payer: 59

## 2014-11-09 ENCOUNTER — Telehealth: Payer: Self-pay | Admitting: Oncology

## 2014-11-09 ENCOUNTER — Other Ambulatory Visit (HOSPITAL_BASED_OUTPATIENT_CLINIC_OR_DEPARTMENT_OTHER): Payer: 59

## 2014-11-09 ENCOUNTER — Ambulatory Visit (HOSPITAL_BASED_OUTPATIENT_CLINIC_OR_DEPARTMENT_OTHER): Payer: 59 | Admitting: Oncology

## 2014-11-09 VITALS — BP 137/78 | HR 104 | Temp 98.2°F | Resp 18 | Ht 62.0 in | Wt 122.2 lb

## 2014-11-09 DIAGNOSIS — Z1509 Genetic susceptibility to other malignant neoplasm: Secondary | ICD-10-CM

## 2014-11-09 DIAGNOSIS — Z5111 Encounter for antineoplastic chemotherapy: Secondary | ICD-10-CM | POA: Diagnosis not present

## 2014-11-09 DIAGNOSIS — C801 Malignant (primary) neoplasm, unspecified: Secondary | ICD-10-CM

## 2014-11-09 DIAGNOSIS — E039 Hypothyroidism, unspecified: Secondary | ICD-10-CM

## 2014-11-09 DIAGNOSIS — C569 Malignant neoplasm of unspecified ovary: Secondary | ICD-10-CM

## 2014-11-09 DIAGNOSIS — Z1231 Encounter for screening mammogram for malignant neoplasm of breast: Secondary | ICD-10-CM

## 2014-11-09 DIAGNOSIS — Z95828 Presence of other vascular implants and grafts: Secondary | ICD-10-CM

## 2014-11-09 DIAGNOSIS — R180 Malignant ascites: Secondary | ICD-10-CM

## 2014-11-09 DIAGNOSIS — Z853 Personal history of malignant neoplasm of breast: Secondary | ICD-10-CM

## 2014-11-09 DIAGNOSIS — D6481 Anemia due to antineoplastic chemotherapy: Secondary | ICD-10-CM

## 2014-11-09 DIAGNOSIS — D649 Anemia, unspecified: Secondary | ICD-10-CM | POA: Diagnosis not present

## 2014-11-09 DIAGNOSIS — J90 Pleural effusion, not elsewhere classified: Secondary | ICD-10-CM | POA: Diagnosis not present

## 2014-11-09 DIAGNOSIS — T451X5A Adverse effect of antineoplastic and immunosuppressive drugs, initial encounter: Secondary | ICD-10-CM

## 2014-11-09 DIAGNOSIS — D701 Agranulocytosis secondary to cancer chemotherapy: Secondary | ICD-10-CM

## 2014-11-09 DIAGNOSIS — K439 Ventral hernia without obstruction or gangrene: Secondary | ICD-10-CM

## 2014-11-09 DIAGNOSIS — Z1501 Genetic susceptibility to malignant neoplasm of breast: Secondary | ICD-10-CM

## 2014-11-09 LAB — CBC WITH DIFFERENTIAL/PLATELET
BASO%: 0 % (ref 0.0–2.0)
Basophils Absolute: 0 10*3/uL (ref 0.0–0.1)
EOS%: 0 % (ref 0.0–7.0)
Eosinophils Absolute: 0 10*3/uL (ref 0.0–0.5)
HEMATOCRIT: 30.4 % — AB (ref 34.8–46.6)
HEMOGLOBIN: 9.7 g/dL — AB (ref 11.6–15.9)
LYMPH%: 9.4 % — ABNORMAL LOW (ref 14.0–49.7)
MCH: 29.8 pg (ref 25.1–34.0)
MCHC: 31.9 g/dL (ref 31.5–36.0)
MCV: 93.3 fL (ref 79.5–101.0)
MONO#: 0 10*3/uL — ABNORMAL LOW (ref 0.1–0.9)
MONO%: 0.6 % (ref 0.0–14.0)
NEUT%: 90 % — ABNORMAL HIGH (ref 38.4–76.8)
NEUTROS ABS: 5.9 10*3/uL (ref 1.5–6.5)
PLATELETS: 187 10*3/uL (ref 145–400)
RBC: 3.26 10*6/uL — ABNORMAL LOW (ref 3.70–5.45)
RDW: 21.5 % — AB (ref 11.2–14.5)
WBC: 6.5 10*3/uL (ref 3.9–10.3)
lymph#: 0.6 10*3/uL — ABNORMAL LOW (ref 0.9–3.3)

## 2014-11-09 LAB — COMPREHENSIVE METABOLIC PANEL (CC13)
ALT: 13 U/L (ref 0–55)
AST: 10 U/L (ref 5–34)
Albumin: 3.8 g/dL (ref 3.5–5.0)
Alkaline Phosphatase: 96 U/L (ref 40–150)
Anion Gap: 16 mEq/L — ABNORMAL HIGH (ref 3–11)
BUN: 19.9 mg/dL (ref 7.0–26.0)
CHLORIDE: 108 meq/L (ref 98–109)
CO2: 17 mEq/L — ABNORMAL LOW (ref 22–29)
CREATININE: 0.7 mg/dL (ref 0.6–1.1)
Calcium: 9.2 mg/dL (ref 8.4–10.4)
GLUCOSE: 182 mg/dL — AB (ref 70–140)
POTASSIUM: 3.9 meq/L (ref 3.5–5.1)
Sodium: 141 mEq/L (ref 136–145)
Total Bilirubin: <0.2 mg/dL (ref 0.20–1.20)
Total Protein: 7.9 g/dL (ref 6.4–8.3)

## 2014-11-09 MED ORDER — FAMOTIDINE IN NACL 20-0.9 MG/50ML-% IV SOLN
INTRAVENOUS | Status: AC
  Filled 2014-11-09: qty 50

## 2014-11-09 MED ORDER — DIPHENHYDRAMINE HCL 50 MG/ML IJ SOLN
25.0000 mg | Freq: Once | INTRAMUSCULAR | Status: AC
  Administered 2014-11-09: 25 mg via INTRAVENOUS

## 2014-11-09 MED ORDER — FAMOTIDINE IN NACL 20-0.9 MG/50ML-% IV SOLN
20.0000 mg | Freq: Once | INTRAVENOUS | Status: AC
  Administered 2014-11-09: 20 mg via INTRAVENOUS

## 2014-11-09 MED ORDER — SODIUM CHLORIDE 0.9 % IJ SOLN
10.0000 mL | INTRAMUSCULAR | Status: DC | PRN
  Administered 2014-11-09: 10 mL
  Filled 2014-11-09: qty 10

## 2014-11-09 MED ORDER — CARBOPLATIN CHEMO INJECTION 600 MG/60ML
417.5000 mg | Freq: Once | INTRAVENOUS | Status: AC
  Administered 2014-11-09: 420 mg via INTRAVENOUS
  Filled 2014-11-09: qty 42

## 2014-11-09 MED ORDER — HEPARIN SOD (PORK) LOCK FLUSH 100 UNIT/ML IV SOLN
500.0000 [IU] | Freq: Once | INTRAVENOUS | Status: AC | PRN
  Administered 2014-11-09: 500 [IU]
  Filled 2014-11-09: qty 5

## 2014-11-09 MED ORDER — SODIUM CHLORIDE 0.9 % IV SOLN
Freq: Once | INTRAVENOUS | Status: AC
  Administered 2014-11-09: 10:00:00 via INTRAVENOUS

## 2014-11-09 MED ORDER — PACLITAXEL CHEMO INJECTION 300 MG/50ML
175.0000 mg/m2 | Freq: Once | INTRAVENOUS | Status: AC
  Administered 2014-11-09: 258 mg via INTRAVENOUS
  Filled 2014-11-09: qty 43

## 2014-11-09 MED ORDER — SODIUM CHLORIDE 0.9 % IV SOLN
Freq: Once | INTRAVENOUS | Status: AC
  Administered 2014-11-09: 10:00:00 via INTRAVENOUS
  Filled 2014-11-09: qty 8

## 2014-11-09 MED ORDER — DIPHENHYDRAMINE HCL 50 MG/ML IJ SOLN
INTRAMUSCULAR | Status: AC
  Filled 2014-11-09: qty 1

## 2014-11-09 NOTE — Telephone Encounter (Signed)
per pof to sch pt appt-sch mamma@ [facility] per pof-gave pt appt time/date/location

## 2014-11-09 NOTE — Patient Instructions (Signed)
[facility] Discharge Instructions for Patients Receiving Chemotherapy  Today you received the following chemotherapy agents Taxol and Carboplatin.  To help prevent nausea and vomiting after your treatment, we encourage you to take your nausea medication as prescribed.   If you develop nausea and vomiting that is not controlled by your nausea medication, call the clinic.   BELOW ARE SYMPTOMS THAT SHOULD BE REPORTED IMMEDIATELY:  *FEVER GREATER THAN 100.5 F  *CHILLS WITH OR WITHOUT FEVER  NAUSEA AND VOMITING THAT IS NOT CONTROLLED WITH YOUR NAUSEA MEDICATION  *UNUSUAL SHORTNESS OF BREATH  *UNUSUAL BRUISING OR BLEEDING  TENDERNESS IN MOUTH AND THROAT WITH OR WITHOUT PRESENCE OF ULCERS  *URINARY PROBLEMS  *BOWEL PROBLEMS  UNUSUAL RASH Items with * indicate a potential emergency and should be followed up as soon as possible.  Feel free to call the clinic you have any questions or concerns. The clinic phone number is [phone].  Please show the CHEMO ALERT CARD at check-in to the Emergency Department and triage nurse.   

## 2014-11-09 NOTE — Telephone Encounter (Signed)
per pof ot sch pt appt-gave pt copy of sch °

## 2014-11-09 NOTE — Progress Notes (Signed)
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 CT CAP 10-16-14 confirmed significant response to neoadjuvant chemotherapy, and Dr Denman George plans surgery 11-28-14 as long as counts adequate, does want [place] to proceed with treatment today, which is total cycle #5. 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  Compared with prior breast imaging at [facility], present diagnostic mammogram had no change, did note extremely dense breast tissue. She is due bilateral screening mammograms in May (last bilateral at [facility] 12-06-13).   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  She has let family members know, and understands that genetics counselors at [facility] can meet with them also.   Patient noticed new mass at upper ventral abdominal scar in last few days, this not present when she saw Dr Denman George on 10-22-13; patient was aware of discomfort when that area first appeared, the discomfort resolving without intervention subsequently. She has been more active outdoors, which may have contributed. She is otherwise feeling well, tho notices that she does not tolerate exertion as she did at good baseline in past. Appetite is good, she has no peripheral neuropathy, no SOB with regular exertion, bowels moving well, no nausea.   PAC in Flu vaccine done genetics testing with OvaNext panel 10-12-14 has mutation in BRCA 2 ([id number]*)   ONCOLOGIC HISTORY Gyn cancer: Patient became symptomatic with abdominal swelling and constipation in late Nov 2015, seen and treated initially at [facility] ED and by PCP Dr Cleda Mccreedy for what was thought to be ulcer. Symptoms continued, with early satiety causing poor po intake; she was seen again at [facility] ED in early Jan 2016, with CT reportedly consistent with stage IIIC or IV gyn cancer, including omental caking, large pelvic mass, para-aortic adenopathy, ascites, pleural effusions and right subpleural nodule. She was seen in consultation by Dr Denman George on 07-25-14, with concern from her exam and scan for rectal involvement. 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 She had paracentesis of 3.4 liters ascites on 07-27-2014, with cytology ([id number]) showing serous carcinoma consistent with gyn primary. She had right thoracentesis for 350 cc on 08-11-14, this requested by anesthesia prior to surgery and apparently not sent for cytology. Patient was more comfortable after paracentesis, but could not tell any difference in breathing after thoracentesis (denies SOB prior to that procedure). CA 125 on 08-02-14 was 1162. She was taken to exploratory laparotomy by Dr Denman George on 08-15-14, with 4 liters of ascites removed and biopsy of omentum obtained, with additional debulking not attempted due to surgical findings of entire peritoneal surface replaced by tumor plaque, small intestine coated with tumor and mesentery tethered with tumor nodules, ileum adherent to pelvic mass, dense plaque of tumor on right diaphragm and 10 cm omental cake in LUQ. Pathology of omental biopsy She needed another paracentesis by IR for 1.3 liters ascites on 08-17-14, and had PAC placed by IR also 08-17-14. Day 1 cycle 1 dose dense carboplatin taxol was given in hospital on 08-18-14. After 2 cycles of dose dense regimen, the carboplatin and taxol was changed to every 3 week regimen beginning  cycle 3. She has needed gCSF support.  Breast cancer: Left breast biopsy 11-22-2003 ([id number]) invasive carcinoma. Left breast wide excision with sentinel nodes and  axillary contents by Dr Benson Norway 01-03-2004 [phone]) poorly differentiated invasive ductal carcinoma 3.0 cm with closest margin 1.7 cm deep, 6 sentinel nodes + 3 additional nodes all negative, ER 0, PR 0, HER 2 3+, Ki67 of 19%. . She received adjuvant chemotherapy by Dr Sonny Dandy with 5FU, epirubicin, cytoxan x 4 cycles from 02-13-2004 thru 04-16-2004, followed by taxol 80 mg/m2 weekly x 11 from 05-07-2004 thru 09-04-2004, and herceptin 05-21-2004 thru 02-25-2005. Antiemetics were Kytril and decadron. She had PAC for the chemotherapy. She had radiation by Dr Tyler Pita in [place]. Last note from Dr Sonny Dandy in this information was 04-2008. Left diagnostic mammogram [place] 10-25-2014 stable compared with 12-06-13 and priors; due bilateral mammograms again late May 2016.    Review of systems as above, also: No bleeding. No fever. No problems with PAC. No changes in either breast that patient can tell. Remainder of 10 point Review of Systems negative.  Objective:  Vital signs in last 24 hours: Weight up 5 lbs to 122, 137/78, 104 regular, 18 not labored RA, 98.2, 100% sat  Alert, oriented and appropriate. Ambulatory without difficulty.  Alopecia  HEENT:PERRL, sclerae not icteric. Oral mucosa moist without lesions, posterior pharynx clear.  Neck supple. No JVD.  Lymphatics:no cervical,supraclavicular, axillary or inguinal adenopathy Resp: clear to auscultation bilaterally and normal percussion bilaterally Cardio: regular rate and rhythm. No gallop. GI: soft, nontender, not distended, no organomegaly. 2.5 - 3 cm apparent hernia just at upper extent of ventral incision, somewhat firm, not tender, reduces somewhat. Normally active bowel sounds. Surgical incision otherwise not remarkable. Musculoskeletal/ Extremities: without pitting edema, cords, tenderness Neuro: no peripheral neuropathy. Otherwise nonfocal. Psych appropriate mood and affect Skin without rash, ecchymosis, petechiae Breasts: Some  thickening laterally in left breast without without dominant mass, skin or nipple findings, otherwise bilaterally without dominant mass or other findings of concern. Axillae benign. Portacath-without erythema or tenderness  Lab Results:  Results for orders placed or performed in visit on 11/09/14  CBC with Differential  Result Value Ref Range   WBC 6.5 3.9 - 10.3 10e3/uL   NEUT# 5.9 1.5 - 6.5 10e3/uL   HGB 9.7 (L) 11.6 - 15.9 g/dL   HCT 30.4 (L) 34.8 - 46.6 %   Platelets 187 145 - 400 10e3/uL   MCV 93.3 79.5 - 101.0 fL   MCH 29.8 25.1 - 34.0 pg   MCHC 31.9 31.5 - 36.0 g/dL   RBC 3.26 (L) 3.70 - 5.45 10e6/uL   RDW 21.5 (H) 11.2 - 14.5 %   lymph# 0.6 (L) 0.9 - 3.3 10e3/uL   MONO# 0.0 (L) 0.1 - 0.9 10e3/uL   Eosinophils Absolute 0.0 0.0 - 0.5 10e3/uL   Basophils Absolute 0.0 0.0 - 0.1 10e3/uL   NEUT% 90.0 (H) 38.4 - 76.8 %   LYMPH% 9.4 (L) 14.0 - 49.7 %   MONO% 0.6 0.0 - 14.0 %   EOS% 0.0 0.0 - 7.0 %   BASO% 0.0 0.0 - 2.0 %    CMET available after visit glucose on steroids 182, CO2 17  CA 125 available after visit down to 18, this having been 76 on 10-05-14 and 1182 on 09-07-14.  Studies/Results:  No results found.  Medications: I have reviewed the patient's current medications.  DISCUSSION: we have discussed new ventral hernia; I have let gyn onc staff know and will also  copy this note to Dr Denman George. Per gyn onc staff, plan is for laparotomy as the upcoming surgical procedure. I have explained etiology of hernia and patient knows to call promptly if persistant pain. I have told her to avoid any heavy lifting or heavy exertion.  We have discussed the BRCA findings. I have explained that siblings/ son/ nieces and nephews are appropriate for testing, which, if positive, can allow interventions in preventative fashion. Genetics counselors at this office are glad to meet with family members in this regard. Patient also should have yearly breast MRI, which we will do within 3 months of  upcoming mammograms (~ late may if possible, following gyn onc surgery 11-28-14).  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 DIsease too extensive at exploratory laparotomy 08-15-14 for debulking, so chemotherapy begun urgently in hospital 08-18-14. Carbo taxol was given as dose dense regimen first 2 cycles with granix, then changed to q 3 week regimen beginning cycle 3, with neulasta. CT CAP 10-16-14 with good partial response thus far. Cycle 5 today, with neulasta on 11-13-14. Will recheck counts shortly prior to 11-28-14 planned surgery. She understands that she will likely need some additional chemotherapy after surgery.  2.New ventral hernia: fairly small, not painful now. Patient education as above, gyn oncology notified. 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 Questioned area lateral left breast on 10-16-14 CT, diagnostic left mammogram at [facility] unchanged scarring in surgical area. For bilateral mammograms at [facility] late May and will get breast MRI yearly beginning within 3 months after bilateral mammograms. 4.BRCA 2 mutation just documented 5.PAC in 6.nutritional status and po intake much improved. 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 8.anemia: Continue oral iron as hemocyte or ferrous fumarate. Follow. Minimally symptomatic. May need PRBCs around surgery 9.post parathyroidectomy 2005 10.hypokalemia resolved off lasix 11.hypothyroidism on replacement: TFTs in good range on present dose, patient aware and  information sent to PCP .12.social stress: lives with 78 yo mother, other family closely involved     All questions answered. Chemo and neulasta orders confirmed. Cc this note to Drs Derek Jack. Patient aware that I have also tried to be in touch with Dr Marcene Duos. Time spent 25 min including >50% counseling and coordination of care.   Bladyn Tipps P, MD   11/09/2014, 8:30 AM

## 2014-11-10 LAB — CA 125: CA 125: 18 U/mL (ref <35)

## 2014-11-11 DIAGNOSIS — Z853 Personal history of malignant neoplasm of breast: Secondary | ICD-10-CM | POA: Insufficient documentation

## 2014-11-11 DIAGNOSIS — K439 Ventral hernia without obstruction or gangrene: Secondary | ICD-10-CM | POA: Insufficient documentation

## 2014-11-11 DIAGNOSIS — D6481 Anemia due to antineoplastic chemotherapy: Secondary | ICD-10-CM | POA: Insufficient documentation

## 2014-11-11 DIAGNOSIS — Z95828 Presence of other vascular implants and grafts: Secondary | ICD-10-CM | POA: Insufficient documentation

## 2014-11-11 DIAGNOSIS — T451X5A Adverse effect of antineoplastic and immunosuppressive drugs, initial encounter: Secondary | ICD-10-CM

## 2014-11-11 DIAGNOSIS — Z1231 Encounter for screening mammogram for malignant neoplasm of breast: Secondary | ICD-10-CM | POA: Insufficient documentation

## 2014-11-13 ENCOUNTER — Ambulatory Visit (HOSPITAL_BASED_OUTPATIENT_CLINIC_OR_DEPARTMENT_OTHER): Payer: 59

## 2014-11-13 ENCOUNTER — Telehealth: Payer: Self-pay

## 2014-11-13 VITALS — BP 116/80 | HR 104 | Temp 98.9°F

## 2014-11-13 DIAGNOSIS — C801 Malignant (primary) neoplasm, unspecified: Secondary | ICD-10-CM | POA: Diagnosis not present

## 2014-11-13 DIAGNOSIS — Z5189 Encounter for other specified aftercare: Secondary | ICD-10-CM

## 2014-11-13 DIAGNOSIS — R180 Malignant ascites: Secondary | ICD-10-CM | POA: Diagnosis not present

## 2014-11-13 DIAGNOSIS — C569 Malignant neoplasm of unspecified ovary: Secondary | ICD-10-CM

## 2014-11-13 MED ORDER — PEGFILGRASTIM INJECTION 6 MG/0.6ML ~~LOC~~
6.0000 mg | PREFILLED_SYRINGE | Freq: Once | SUBCUTANEOUS | Status: AC
  Administered 2014-11-13: 6 mg via SUBCUTANEOUS
  Filled 2014-11-13: qty 0.6

## 2014-11-13 NOTE — Telephone Encounter (Signed)
Told Kathleen Bond the results of the CA-125 as noted below by Dr. Marko Plume.  Patient verbalized understanding.

## 2014-11-13 NOTE — Telephone Encounter (Signed)
-----   Message from Gordy Levan, MD sent at 11/12/2014  4:29 PM EDT ----- Labs seen and need follow up: please let her know marker down to 18, which is back into normal range now

## 2014-11-22 NOTE — Patient Instructions (Addendum)
Kathleen Bond  11/22/2014   Your procedure is scheduled on:    11/28/2014    Report to [facility] Main  Entrance and follow signs to               [place] at    12 noon  Call this number if you have problems the morning of surgery [phone]   Remember:   Clear liquid diet beginning on Monday 11/27/2014  1 bottle of Mag Citrate nite before surgery.    Do not eat food after midnite.  May have clear liquids until 0800am morning of surgery then nothing by mouth.     Take these medicines the morning of surgery with A SIP OF WATER: Synthroid, Lorazepam ( Ativan) if needed                                You may not have any metal on your body including hair pins and              piercings  Do not wear jewelry, make-up, lotions, powders or perfumes., deodorant.               Do not wear nail polish.  Do not shave  48 hours prior to surgery.               Do not bring valuables to the hospital. [place].  Contacts, dentures or bridgework may not be worn into surgery.  Leave suitcase in the car. After surgery it may be brought to your room.     N  Special Instructions: coughing and deep breathing exercises, leg exercises    CLEAR LIQUID DIET   Foods Allowed                                                                     Foods Excluded  Coffee and tea, regular and decaf                             liquids that you cannot  Plain Jell-O in any flavor                                             see through such as: Fruit ices (not with fruit pulp)                                     milk, soups, orange juice  Iced Popsicles                                    All solid food Carbonated beverages, regular and diet  Cranberry, grape and apple juices Sports drinks like Gatorade Lightly seasoned clear broth or consume(fat free) Sugar, honey syrup  Sample Menu Breakfast                                 Lunch                                     Supper Cranberry juice                    Beef broth                            Chicken broth Jell-O                                     Grape juice                           Apple juice Coffee or tea                        Jell-O                                      Popsicle                                                Coffee or tea                        Coffee or tea  _____________________________________________________________________                Please read over the following fact sheets you were given: _____________________________________________________________________             [facility] - Preparing for Surgery Before surgery, you can play an important role.  Because skin is not sterile, your skin needs to be as free of germs as possible.  You can reduce the number of germs on your skin by washing with CHG (chlorahexidine gluconate) soap before surgery.  CHG is an antiseptic cleaner which kills germs and bonds with the skin to continue killing germs even after washing. Please DO NOT use if you have an allergy to CHG or antibacterial soaps.  If your skin becomes reddened/irritated stop using the CHG and inform your nurse when you arrive at Short Stay. Do not shave (including legs and underarms) for at least 48 hours prior to the first CHG shower.  You may shave your face/neck. Please follow these instructions carefully:  1.  Shower with CHG Soap the night before surgery and the  morning of Surgery.  2.  If you choose to wash your hair, wash your hair first as usual with your  normal  shampoo.  3.  After you shampoo, rinse your hair and body thoroughly to remove the  shampoo.  4.  Use CHG as you would any other liquid soap.  You can apply chg directly  to the skin and wash                       Gently with a scrungie or clean washcloth.  5.  Apply the CHG Soap to your body ONLY  FROM THE NECK DOWN.   Do not use on face/ open                           Wound or open sores. Avoid contact with eyes, ears mouth and genitals (private parts).                       Wash face,  Genitals (private parts) with your normal soap.             6.  Wash thoroughly, paying special attention to the area where your surgery  will be performed.  7.  Thoroughly rinse your body with warm water from the neck down.  8.  DO NOT shower/wash with your normal soap after using and rinsing off  the CHG Soap.                9.  Pat yourself dry with a clean towel.            10.  Wear clean pajamas.            11.  Place clean sheets on your bed the night of your first shower and do not  sleep with pets. Day of Surgery : Do not apply any lotions/deodorants the morning of surgery.  Please wear clean clothes to the hospital/surgery center.  FAILURE TO FOLLOW THESE INSTRUCTIONS MAY RESULT IN THE CANCELLATION OF YOUR SURGERY PATIENT SIGNATURE_________________________________  NURSE SIGNATURE__________________________________  ________________________________________________________________________  WHAT IS A BLOOD TRANSFUSION? Blood Transfusion Information  A transfusion is the replacement of blood or some of its parts. Blood is made up of multiple cells which provide different functions.  Red blood cells carry oxygen and are used for blood loss replacement.  White blood cells fight against infection.  Platelets control bleeding.  Plasma helps clot blood.  Other blood products are available for specialized needs, such as hemophilia or other clotting disorders. BEFORE THE TRANSFUSION  Who gives blood for transfusions?   Healthy volunteers who are fully evaluated to make sure their blood is safe. This is blood bank blood. Transfusion therapy is the safest it has ever been in the practice of medicine. Before blood is taken from a donor, a complete history is taken to make sure that person has  no history of diseases nor engages in risky social behavior (examples are intravenous drug use or sexual activity with multiple partners). The donor's travel history is screened to minimize risk of transmitting infections, such as malaria. The donated blood is tested for signs of infectious diseases, such as HIV and hepatitis. The blood is then tested to be sure it is compatible with you in order to minimize the chance of a transfusion reaction. If you or a relative donates blood, this is often done in anticipation of surgery and is not appropriate for emergency situations. It takes many days to process the donated blood. RISKS AND COMPLICATIONS Although transfusion therapy is very safe and saves many lives, the main dangers of transfusion include:  1. Getting an infectious disease. 2. Developing a transfusion reaction.  This is an allergic reaction to something in the blood you were given. Every precaution is taken to prevent this. The decision to have a blood transfusion has been considered carefully by your caregiver before blood is given. Blood is not given unless the benefits outweigh the risks. AFTER THE TRANSFUSION  Right after receiving a blood transfusion, you will usually feel much better and more energetic. This is especially true if your red blood cells have gotten low (anemic). The transfusion raises the level of the red blood cells which carry oxygen, and this usually causes an energy increase.  The nurse administering the transfusion will monitor you carefully for complications. HOME CARE INSTRUCTIONS  No special instructions are needed after a transfusion. You may find your energy is better. Speak with your caregiver about any limitations on activity for underlying diseases you may have. SEEK MEDICAL CARE IF:   Your condition is not improving after your transfusion.  You develop redness or irritation at the intravenous (IV) site. SEEK IMMEDIATE MEDICAL CARE IF:  Any of the following  symptoms occur over the next 12 hours:  Shaking chills.  You have a temperature by mouth above 102 F (38.9 C), not controlled by medicine.  Chest, back, or muscle pain.  People around you feel you are not acting correctly or are confused.  Shortness of breath or difficulty breathing.  Dizziness and fainting.  You get a rash or develop hives.  You have a decrease in urine output.  Your urine turns a dark color or changes to pink, red, or brown. Any of the following symptoms occur over the next 10 days:  You have a temperature by mouth above 102 F (38.9 C), not controlled by medicine.  Shortness of breath.  Weakness after normal activity.  The white part of the eye turns yellow (jaundice).  You have a decrease in the amount of urine or are urinating less often.  Your urine turns a dark color or changes to pink, red, or brown. Document Released: 07/04/2000 Document Revised: 09/29/2011 Document Reviewed: 02/21/2008 ExitCare Patient Information 2014 [place].  _______________________________________________________________________  Incentive Spirometer  An incentive spirometer is a tool that can help keep your lungs clear and active. This tool measures how well you are filling your lungs with each breath. Taking long deep breaths may help reverse or decrease the chance of developing breathing (pulmonary) problems (especially infection) following:  A long period of time when you are unable to move or be active. BEFORE THE PROCEDURE   If the spirometer includes an indicator to show your best effort, your nurse or respiratory therapist will set it to a desired goal.  If possible, sit up straight or lean slightly forward. Try not to slouch.  Hold the incentive spirometer in an upright position. INSTRUCTIONS FOR USE  3. Sit on the edge of your bed if possible, or sit up as far as you can in bed or on a chair. 4. Hold the incentive spirometer in an upright  position. 5. Breathe out normally. 6. Place the mouthpiece in your mouth and seal your lips tightly around it. 7. Breathe in slowly and as deeply as possible, raising the piston or the ball toward the top of the column. 8. Hold your breath for 3-5 seconds or for as long as possible. Allow the piston or ball to fall to the bottom of the column. 9. Remove the mouthpiece from your mouth and breathe out normally. 10. Rest for a few seconds and repeat Steps  1 through 7 at least 10 times every 1-2 hours when you are awake. Take your time and take a few normal breaths between deep breaths. 11. The spirometer may include an indicator to show your best effort. Use the indicator as a goal to work toward during each repetition. 12. After each set of 10 deep breaths, practice coughing to be sure your lungs are clear. If you have an incision (the cut made at the time of surgery), support your incision when coughing by placing a pillow or rolled up towels firmly against it. Once you are able to get out of bed, walk around indoors and cough well. You may stop using the incentive spirometer when instructed by your caregiver.  RISKS AND COMPLICATIONS  Take your time so you do not get dizzy or light-headed.  If you are in pain, you may need to take or ask for pain medication before doing incentive spirometry. It is harder to take a deep breath if you are having pain. AFTER USE  Rest and breathe slowly and easily.  It can be helpful to keep track of a log of your progress. Your caregiver can provide you with a simple table to help with this. If you are using the spirometer at home, follow these instructions: [place] IF:   You are having difficultly using the spirometer.  You have trouble using the spirometer as often as instructed.  Your pain medication is not giving enough relief while using the spirometer.  You develop fever of 100.5 F (38.1 C) or higher. SEEK IMMEDIATE MEDICAL CARE IF:    You cough up bloody sputum that had not been present before.  You develop fever of 102 F (38.9 C) or greater.  You develop worsening pain at or near the incision site. MAKE SURE YOU:   Understand these instructions.  Will watch your condition.  Will get help right away if you are not doing well or get worse. Document Released: 11/17/2006 Document Revised: 09/29/2011 Document Reviewed: 01/18/2007 [facility] Patient Information 2014 [place], [place].   ________________________________________________________________________

## 2014-11-23 ENCOUNTER — Encounter (HOSPITAL_COMMUNITY): Payer: Self-pay

## 2014-11-23 ENCOUNTER — Other Ambulatory Visit (HOSPITAL_BASED_OUTPATIENT_CLINIC_OR_DEPARTMENT_OTHER): Payer: 59

## 2014-11-23 ENCOUNTER — Ambulatory Visit (HOSPITAL_BASED_OUTPATIENT_CLINIC_OR_DEPARTMENT_OTHER): Payer: 59 | Admitting: Oncology

## 2014-11-23 ENCOUNTER — Encounter (HOSPITAL_COMMUNITY)
Admission: RE | Admit: 2014-11-23 | Discharge: 2014-11-23 | Disposition: A | Discharge status: Home / Self Care | Payer: 59 | Source: Ambulatory Visit | Attending: Gynecologic Oncology | Admitting: Gynecologic Oncology

## 2014-11-23 ENCOUNTER — Other Ambulatory Visit: Payer: Self-pay | Admitting: Oncology

## 2014-11-23 ENCOUNTER — Encounter: Payer: Self-pay | Admitting: Oncology

## 2014-11-23 VITALS — BP 110/75 | HR 75 | Temp 97.9°F | Resp 18 | Ht 62.0 in | Wt 123.6 lb

## 2014-11-23 DIAGNOSIS — C569 Malignant neoplasm of unspecified ovary: Secondary | ICD-10-CM

## 2014-11-23 DIAGNOSIS — R188 Other ascites: Secondary | ICD-10-CM | POA: Diagnosis not present

## 2014-11-23 DIAGNOSIS — D6481 Anemia due to antineoplastic chemotherapy: Secondary | ICD-10-CM

## 2014-11-23 DIAGNOSIS — K439 Ventral hernia without obstruction or gangrene: Secondary | ICD-10-CM

## 2014-11-23 DIAGNOSIS — E039 Hypothyroidism, unspecified: Secondary | ICD-10-CM

## 2014-11-23 DIAGNOSIS — C801 Malignant (primary) neoplasm, unspecified: Secondary | ICD-10-CM | POA: Diagnosis not present

## 2014-11-23 DIAGNOSIS — Z853 Personal history of malignant neoplasm of breast: Secondary | ICD-10-CM

## 2014-11-23 DIAGNOSIS — Z01818 Encounter for other preprocedural examination: Secondary | ICD-10-CM | POA: Diagnosis not present

## 2014-11-23 DIAGNOSIS — T451X5A Adverse effect of antineoplastic and immunosuppressive drugs, initial encounter: Secondary | ICD-10-CM

## 2014-11-23 DIAGNOSIS — N6320 Unspecified lump in the left breast, unspecified quadrant: Secondary | ICD-10-CM

## 2014-11-23 DIAGNOSIS — I10 Essential (primary) hypertension: Secondary | ICD-10-CM

## 2014-11-23 DIAGNOSIS — Z1509 Genetic susceptibility to other malignant neoplasm: Secondary | ICD-10-CM

## 2014-11-23 DIAGNOSIS — R21 Rash and other nonspecific skin eruption: Secondary | ICD-10-CM

## 2014-11-23 DIAGNOSIS — J90 Pleural effusion, not elsewhere classified: Secondary | ICD-10-CM

## 2014-11-23 DIAGNOSIS — Z1501 Genetic susceptibility to malignant neoplasm of breast: Secondary | ICD-10-CM

## 2014-11-23 DIAGNOSIS — D701 Agranulocytosis secondary to cancer chemotherapy: Secondary | ICD-10-CM

## 2014-11-23 DIAGNOSIS — C50912 Malignant neoplasm of unspecified site of left female breast: Secondary | ICD-10-CM

## 2014-11-23 DIAGNOSIS — Z95828 Presence of other vascular implants and grafts: Secondary | ICD-10-CM

## 2014-11-23 HISTORY — DX: Anemia, unspecified: D64.9

## 2014-11-23 LAB — URINALYSIS, ROUTINE W REFLEX MICROSCOPIC
Bilirubin Urine: NEGATIVE
Glucose, UA: NEGATIVE mg/dL
Hgb urine dipstick: NEGATIVE
Ketones, ur: NEGATIVE mg/dL
Leukocytes, UA: NEGATIVE
NITRITE: NEGATIVE
PROTEIN: NEGATIVE mg/dL
SPECIFIC GRAVITY, URINE: 1.015 (ref 1.005–1.030)
UROBILINOGEN UA: 0.2 mg/dL (ref 0.0–1.0)
pH: 8.5 — ABNORMAL HIGH (ref 5.0–8.0)

## 2014-11-23 LAB — COMPREHENSIVE METABOLIC PANEL (CC13)
ALT: 12 U/L (ref 0–55)
ANION GAP: 12 meq/L — AB (ref 3–11)
AST: 11 U/L (ref 5–34)
Albumin: 3.8 g/dL (ref 3.5–5.0)
Alkaline Phosphatase: 104 U/L (ref 40–150)
BUN: 9.4 mg/dL (ref 7.0–26.0)
CALCIUM: 8.8 mg/dL (ref 8.4–10.4)
CO2: 22 mEq/L (ref 22–29)
Chloride: 104 mEq/L (ref 98–109)
Creatinine: 0.7 mg/dL (ref 0.6–1.1)
EGFR: >90 mL/min/{1.73_m2} (ref >90)
GLUCOSE: 83 mg/dL (ref 70–140)
POTASSIUM: 4.2 meq/L (ref 3.5–5.1)
Sodium: 139 mEq/L (ref 136–145)
Total Protein: 7 g/dL (ref 6.4–8.3)

## 2014-11-23 LAB — CBC WITH DIFFERENTIAL/PLATELET
BASO%: 0.1 % (ref 0.0–2.0)
Basophils Absolute: 0 10*3/uL (ref 0.0–0.1)
EOS ABS: 0 10*3/uL (ref 0.0–0.5)
EOS%: 0.2 % (ref 0.0–7.0)
HEMATOCRIT: 28.8 % — AB (ref 34.8–46.6)
HGB: 9.3 g/dL — ABNORMAL LOW (ref 11.6–15.9)
LYMPH%: 16.5 % (ref 14.0–49.7)
MCH: 31.2 pg (ref 25.1–34.0)
MCHC: 32.3 g/dL (ref 31.5–36.0)
MCV: 96.6 fL (ref 79.5–101.0)
MONO#: 1.2 10*3/uL — ABNORMAL HIGH (ref 0.1–0.9)
MONO%: 8.1 % (ref 0.0–14.0)
NEUT#: 11.2 10*3/uL — ABNORMAL HIGH (ref 1.5–6.5)
NEUT%: 75.1 % (ref 38.4–76.8)
PLATELETS: 196 10*3/uL (ref 145–400)
RBC: 2.98 10*6/uL — ABNORMAL LOW (ref 3.70–5.45)
RDW: 20.4 % — ABNORMAL HIGH (ref 11.2–14.5)
WBC: 14.9 10*3/uL — ABNORMAL HIGH (ref 3.9–10.3)
lymph#: 2.5 10*3/uL (ref 0.9–3.3)

## 2014-11-23 MED ORDER — TRAMADOL HCL 50 MG PO TABS: 100.0000 mg | ORAL_TABLET | Freq: Four times a day (QID) | ORAL | Status: DC | PRN

## 2014-11-23 NOTE — Progress Notes (Addendum)
Spoke with Cindi Carbon  , RN at office of Dr Denman George and made aware of white count of 14.9 on today's labs.  She is aware that at present time patient is seeing Dr Marko Plume,  Also U/A done on 11/23/14 is negative.  CBC and U/A done 11/23/2014 have been faxed to Dr Denman George and Joylene John, NP.  Cindi Carbon stated she would note labs.

## 2014-11-23 NOTE — Progress Notes (Signed)
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 CT after 3 cycles showed significant improvement and CA 125 was 18 on day of cycle 5, from 1162 at presentation. Plan is for interval debulking by Dr Denman George on 11-28-14.  She had neulasta on 11-13-14. 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  Patient tolerated cycle 5 chemo without new or different problems. The recent ventral hernia has not been painful and does not seem as noticeable to patient. Nausea has been well controlled and she has no significant peripheral neuropathy. Bowels are moving regularly. Appetite is good. She is a little more tired with exertion, and understands that she may need PRBCs around surgery. She continues oral iron as instructed, and has had no bleeding. She again has rash involving central face, thought related to steroids for chemo. Energy has been good enough to do some planting.    PAC in Flu vaccine done genetics testing with [employer] panel 10-12-14 has mutation in BRCA 2 (p.[id number]*) CA 125  1162 on 08-02-14   ONCOLOGIC HISTORY Gyn cancer: Patient became symptomatic with abdominal swelling and constipation in late Nov 2015, seen and treated initially at [facility] ED and by PCP Dr Cleda Mccreedy for what was thought to be ulcer. Symptoms continued, with early satiety causing poor po intake; she was seen again at [facility] ED in early Jan 2016, with CT reportedly consistent with stage IIIC or IV gyn cancer, including omental caking, large pelvic mass, para-aortic adenopathy, ascites, pleural effusions and right subpleural nodule. She was seen in consultation by Dr Denman George on 07-25-14, with concern from her exam and  scan for rectal involvement. 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 She had paracentesis of 3.4 liters ascites on 07-27-2014, with cytology ([id number]) showing serous carcinoma consistent with gyn primary. She had right thoracentesis for 350 cc on 08-11-14, this requested by anesthesia prior to surgery and apparently not sent for cytology. Patient was more comfortable after paracentesis, but could not tell any difference in breathing after thoracentesis (denies SOB prior to that procedure). CA 125 on 08-02-14 was 1162. She was taken to exploratory laparotomy by Dr Denman George on 08-15-14, with 4 liters of ascites removed and biopsy of omentum obtained, with additional debulking not attempted due to surgical findings of entire peritoneal surface replaced by tumor plaque, small intestine coated with tumor and mesentery tethered with tumor nodules, ileum adherent to pelvic mass, dense plaque of tumor on right diaphragm and 10 cm omental cake in LUQ. Pathology of omental biopsy She needed another paracentesis by IR for 1.3 liters ascites on 08-17-14, and had PAC placed by IR also 08-17-14. Day 1 cycle 1 dose dense carboplatin taxol was given in hospital on 08-18-14. After 2 cycles of dose dense regimen, the carboplatin and taxol was changed to every 3 week regimen beginning cycle 3. She has needed gCSF support.  Breast cancer: Left breast biopsy 11-22-2003 ([id number]) invasive carcinoma. Left breast wide excision with sentinel nodes and axillary contents by Dr Benson Norway 01-03-2004 [phone]) poorly differentiated invasive ductal carcinoma 3.0 cm with closest margin 1.7 cm deep, 6 sentinel nodes + 3 additional nodes all negative, ER  0, PR 0, HER 2 3+, Ki67 of 19%. . She received adjuvant chemotherapy by Dr Sonny Dandy with 5FU, epirubicin, cytoxan x 4 cycles from 02-13-2004 thru 04-16-2004, followed by taxol 80 mg/m2 weekly x 11 from 05-07-2004 thru 09-04-2004, and  herceptin 05-21-2004 thru 02-25-2005. Antiemetics were Kytril and decadron. She had PAC for the chemotherapy. She had radiation by Dr Tyler Pita in [place]. Last note from Dr Sonny Dandy in this information was 04-2008. Left diagnostic mammogram [facility] 10-25-2014 stable compared with 12-06-13 and priors; due bilateral mammograms again late May 2016.    Review of systems as above, also: No problems with PAC. No changes in either breast. No LE swelling. Bladder ok.  Remainder of 10 point Review of Systems negative.  Objective:  Vital signs in last 24 hours:  BP 110/75 mmHg  Pulse 75  Temp(Src) 97.9 F (36.6 C) (Oral)  Resp 18  Ht 5' 2"  (1.575 m)  Wt 123 lb 9.6 oz (56.065 kg)  BMI 22.60 kg/m2 Weight up 1.5 lbs Alert, oriented and appropriate. Ambulatory without difficulty.  Alopecia  HEENT:PERRL, sclerae not icteric. Oral mucosa moist without lesions, posterior pharynx clear.  Neck supple. No JVD.  Lymphatics:no cervical,supraclavicular, axillary or inguinal adenopathy Resp: clear to auscultation bilaterally and normal percussion bilaterally Cardio: regular rate and rhythm. No gallop. GI: soft, nontender, not distended, no organomegaly. Normally active bowel sounds. Apparent ventral hernia ~ 3 cm at upper end of surgical incision, which is otherwise not remarkable. Musculoskeletal/ Extremities: without pitting edema, cords, tenderness Neuro: no change peripheral neuropathy. Otherwise nonfocal Skin without ecchymosis, petechiae. Central face with acneiform or rosacea type lesions. Breasts: right without dominant mass, skin or nipple findings. Left with stable thickening laterally in area of previous surgery and radiation, no dominant mass otherwise. Axillae benign. Portacath-without erythema or tenderness  Lab Results:  Results for orders placed or performed in visit on 11/23/14  CBC with Differential  Result Value Ref Range   WBC 14.9 (H) 3.9 - 10.3 10e3/uL   NEUT# 11.2 (H) 1.5 - 6.5  10e3/uL   HGB 9.3 (L) 11.6 - 15.9 g/dL   HCT 28.8 (L) 34.8 - 46.6 %   Platelets 196 145 - 400 10e3/uL   MCV 96.6 79.5 - 101.0 fL   MCH 31.2 25.1 - 34.0 pg   MCHC 32.3 31.5 - 36.0 g/dL   RBC 2.98 (L) 3.70 - 5.45 10e6/uL   RDW 20.4 (H) 11.2 - 14.5 %   lymph# 2.5 0.9 - 3.3 10e3/uL   MONO# 1.2 (H) 0.1 - 0.9 10e3/uL   Eosinophils Absolute 0.0 0.0 - 0.5 10e3/uL   Basophils Absolute 0.0 0.0 - 0.1 10e3/uL   NEUT% 75.1 38.4 - 76.8 %   LYMPH% 16.5 14.0 - 49.7 %   MONO% 8.1 0.0 - 14.0 %   EOS% 0.2 0.0 - 7.0 %   BASO% 0.1 0.0 - 2.0 %  Comprehensive metabolic panel (Cmet) - [facility]  Result Value Ref Range   Sodium 139 136 - 145 mEq/L   Potassium 4.2 3.5 - 5.1 mEq/L   Chloride 104 98 - 109 mEq/L   CO2 22 22 - 29 mEq/L   Glucose 83 70 - 140 mg/dl   BUN 9.4 7.0 - 26.0 mg/dL   Creatinine 0.7 0.6 - 1.1 mg/dL   Total Bilirubin <0.20 0.20 - 1.20 mg/dL   Alkaline Phosphatase 104 40 - 150 U/L   AST 11 5 - 34 U/L   ALT 12 0 - 55 U/L   Total Protein  7.0 6.4 - 8.3 g/dL   Albumin 3.8 3.5 - 5.0 g/dL   Calcium 8.8 8.4 - 10.4 mg/dL   Anion Gap 12 (H) 3 - 11 mEq/L   EGFR >90 >90 ml/min/1.73 m2   UA today negative, done preop Type and screen done with her preop labs today  Studies/Results:  No results found.  Medications: I have reviewed the patient's current medications.  DISCUSSION: patient is comfortable with decision to proceed with interval debulking. She understands that it is likely that she will need additional chemotherapy after surgery.   Assessment/Plan: 1.IIIC vs IV high grade serous gyn carcinoma: at diagnosis had large volume malignant ascites and pleural effusions. She has had good response to 5 cycles of neoadjuvant carboplatin taxol chemotherapy and will have interval debulking on 11-28-14. She has apt back with medical oncology on 12-21-14, which can be adjusted if needed.   2.New ventral hernia: noticeable just after last visit to Dr Denman George, gyn oncology aware. Less uncomfortable than  it was initially. 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 Questioned area lateral left breast on 10-16-14 CT, diagnostic left mammogram at [facility] unchanged scarring in surgical area. For bilateral mammograms at [facility] late May and will get breast MRI yearly beginning within 3 months after bilateral mammograms. 4.BRCA 2 mutation recently documented 5.PAC in 6.nutritional status and po intake much improved. 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 8.Chemo anemia: Continue oral iron as hemocyte or ferrous fumarate. Minimally symptomatic. May need PRBCs around surgery 9.post parathyroidectomy 2005 10.hypokalemia resolved off lasix 11.hypothyroidism on replacement: TFTs in good range on present dose, patient aware and information sent to PCP .12.social stress: lives with 89 yo mother, other family closely involved  13.facial rash appears related to steroids with chemo, no history of rosacea.  All questions answered. Patient knows to call with any concerns prior to surgery or to next scheduled visit. Time spent 25 min including >50% counseling and coordination of care. CC this note Dr Eula Fried.    Kathleen Bond P, MD   11/23/2014, 4:14 PM

## 2014-11-23 NOTE — Progress Notes (Addendum)
EKG- 08/02/14 in [facility]  08/10/14 Office visit by cardiology [facility]  CT chest done 10/16/14 [facility]

## 2014-11-28 ENCOUNTER — Encounter (HOSPITAL_COMMUNITY): Payer: Self-pay | Admitting: Anesthesiology

## 2014-11-28 ENCOUNTER — Encounter (HOSPITAL_COMMUNITY)
Admission: RE | Disposition: A | Discharge status: Home / Self Care | Payer: Self-pay | Source: Ambulatory Visit | Attending: Gynecologic Oncology

## 2014-11-28 ENCOUNTER — Inpatient Hospital Stay (HOSPITAL_COMMUNITY): Payer: 59 | Admitting: Anesthesiology

## 2014-11-28 ENCOUNTER — Inpatient Hospital Stay (HOSPITAL_COMMUNITY)
Admission: RE | Admit: 2014-11-28 | Discharge: 2014-12-04 | DRG: 737 | Disposition: A | Discharge status: Home / Self Care | Payer: 59 | Source: Ambulatory Visit | Attending: Gynecologic Oncology | Admitting: Gynecologic Oncology

## 2014-11-28 DIAGNOSIS — C561 Malignant neoplasm of right ovary: Secondary | ICD-10-CM

## 2014-11-28 DIAGNOSIS — C562 Malignant neoplasm of left ovary: Secondary | ICD-10-CM

## 2014-11-28 DIAGNOSIS — N135 Crossing vessel and stricture of ureter without hydronephrosis: Secondary | ICD-10-CM | POA: Diagnosis present

## 2014-11-28 DIAGNOSIS — R000 Tachycardia, unspecified: Secondary | ICD-10-CM | POA: Diagnosis not present

## 2014-11-28 DIAGNOSIS — R5082 Postprocedural fever: Secondary | ICD-10-CM

## 2014-11-28 DIAGNOSIS — C50912 Malignant neoplasm of unspecified site of left female breast: Secondary | ICD-10-CM

## 2014-11-28 DIAGNOSIS — Z9221 Personal history of antineoplastic chemotherapy: Secondary | ICD-10-CM | POA: Diagnosis not present

## 2014-11-28 DIAGNOSIS — I10 Essential (primary) hypertension: Secondary | ICD-10-CM | POA: Diagnosis present

## 2014-11-28 DIAGNOSIS — C569 Malignant neoplasm of unspecified ovary: Secondary | ICD-10-CM | POA: Diagnosis present

## 2014-11-28 DIAGNOSIS — N6320 Unspecified lump in the left breast, unspecified quadrant: Secondary | ICD-10-CM

## 2014-11-28 DIAGNOSIS — D62 Acute posthemorrhagic anemia: Secondary | ICD-10-CM | POA: Diagnosis not present

## 2014-11-28 DIAGNOSIS — K660 Peritoneal adhesions (postprocedural) (postinfection): Secondary | ICD-10-CM | POA: Diagnosis present

## 2014-11-28 DIAGNOSIS — R180 Malignant ascites: Secondary | ICD-10-CM

## 2014-11-28 HISTORY — PX: SALPINGOOPHORECTOMY: SHX82

## 2014-11-28 HISTORY — PX: ABDOMINAL HYSTERECTOMY: SHX81

## 2014-11-28 HISTORY — PX: OMENTECTOMY: SHX5985

## 2014-11-28 HISTORY — PX: DEBULKING: SHX6277

## 2014-11-28 LAB — POCT I-STAT 4, (NA,K, GLUC, HGB,HCT)
GLUCOSE: 148 mg/dL — AB (ref 70–99)
HCT: 25 % — ABNORMAL LOW (ref 36.0–46.0)
Hemoglobin: 8.5 g/dL — ABNORMAL LOW (ref 12.0–15.0)
Potassium: 3.9 mmol/L (ref 3.5–5.1)
Sodium: 138 mmol/L (ref 135–145)

## 2014-11-28 SURGERY — HYSTERECTOMY, ABDOMINAL
Anesthesia: General

## 2014-11-28 MED ORDER — HYDROMORPHONE HCL 1 MG/ML IJ SOLN
INTRAMUSCULAR | Status: DC | PRN
  Administered 2014-11-28 (×5): .4 mg via INTRAVENOUS

## 2014-11-28 MED ORDER — METOCLOPRAMIDE HCL 5 MG/ML IJ SOLN
INTRAMUSCULAR | Status: DC | PRN
  Administered 2014-11-28: 10 mg via INTRAVENOUS

## 2014-11-28 MED ORDER — HYDROMORPHONE HCL 1 MG/ML IJ SOLN
INTRAMUSCULAR | Status: AC
  Filled 2014-11-28: qty 1

## 2014-11-28 MED ORDER — GLYCOPYRROLATE 0.2 MG/ML IJ SOLN
INTRAMUSCULAR | Status: DC | PRN
  Administered 2014-11-28: 0.6 mg via INTRAVENOUS

## 2014-11-28 MED ORDER — NALOXONE HCL 0.4 MG/ML IJ SOLN: 0.4000 mg | INTRAMUSCULAR | Status: DC | PRN

## 2014-11-28 MED ORDER — PHENYLEPHRINE HCL 10 MG/ML IJ SOLN
INTRAMUSCULAR | Status: DC | PRN
  Administered 2014-11-28: 40 ug via INTRAVENOUS
  Administered 2014-11-28 (×2): 80 ug via INTRAVENOUS

## 2014-11-28 MED ORDER — ENALAPRIL MALEATE 10 MG PO TABS
10.0000 mg | ORAL_TABLET | Freq: Every morning | ORAL | Status: DC
  Administered 2014-11-29: 10 mg via ORAL
  Filled 2014-11-28 (×2): qty 1

## 2014-11-28 MED ORDER — ENOXAPARIN SODIUM 40 MG/0.4ML ~~LOC~~ SOLN
40.0000 mg | SUBCUTANEOUS | Status: AC
  Administered 2014-11-28: 40 mg via SUBCUTANEOUS
  Filled 2014-11-28: qty 0.4

## 2014-11-28 MED ORDER — ACETAMINOPHEN 10 MG/ML IV SOLN
1000.0000 mg | Freq: Once | INTRAVENOUS | Status: AC
  Administered 2014-11-28: 1000 mg via INTRAVENOUS
  Filled 2014-11-28: qty 100

## 2014-11-28 MED ORDER — LACTATED RINGERS IV SOLN
INTRAVENOUS | Status: DC
  Administered 2014-11-28: 14:00:00 via INTRAVENOUS
  Administered 2014-11-28: 1000 mL via INTRAVENOUS

## 2014-11-28 MED ORDER — KETOROLAC TROMETHAMINE 30 MG/ML IJ SOLN
15.0000 mg | Freq: Four times a day (QID) | INTRAMUSCULAR | Status: DC
  Administered 2014-11-28 – 2014-11-29 (×3): 15 mg via INTRAVENOUS
  Filled 2014-11-28 (×6): qty 1

## 2014-11-28 MED ORDER — MIDAZOLAM HCL 5 MG/5ML IJ SOLN
INTRAMUSCULAR | Status: DC | PRN
  Administered 2014-11-28: 1 mg via INTRAVENOUS

## 2014-11-28 MED ORDER — KETOROLAC TROMETHAMINE 30 MG/ML IJ SOLN
INTRAMUSCULAR | Status: AC
  Administered 2014-11-28: 15 mg via INTRAVENOUS
  Filled 2014-11-28: qty 1

## 2014-11-28 MED ORDER — ALBUMIN HUMAN 5 % IV SOLN
INTRAVENOUS | Status: AC
  Filled 2014-11-28: qty 500

## 2014-11-28 MED ORDER — SODIUM CHLORIDE 0.9 % IJ SOLN: 9.0000 mL | INTRAMUSCULAR | Status: DC | PRN

## 2014-11-28 MED ORDER — KETOROLAC TROMETHAMINE 30 MG/ML IJ SOLN
15.0000 mg | Freq: Four times a day (QID) | INTRAMUSCULAR | Status: DC
  Filled 2014-11-28 (×4): qty 1

## 2014-11-28 MED ORDER — HYDROMORPHONE HCL 1 MG/ML IJ SOLN
0.2500 mg | INTRAMUSCULAR | Status: DC | PRN
  Administered 2014-11-28 (×2): 0.5 mg via INTRAVENOUS

## 2014-11-28 MED ORDER — HYDROMORPHONE 0.3 MG/ML IV SOLN
INTRAVENOUS | Status: DC
  Administered 2014-11-28: 0.999 mg via INTRAVENOUS
  Administered 2014-11-28: 18:00:00 via INTRAVENOUS
  Administered 2014-11-29: 0.7999 mg via INTRAVENOUS
  Administered 2014-11-29: 0.399 mg via INTRAVENOUS
  Administered 2014-11-29: 0.599 mg via INTRAVENOUS
  Administered 2014-11-29: 1.19 mg via INTRAVENOUS
  Administered 2014-11-29: 0.999 mg via INTRAVENOUS

## 2014-11-28 MED ORDER — HYDROMORPHONE 0.3 MG/ML IV SOLN
INTRAVENOUS | Status: AC
  Filled 2014-11-28: qty 25

## 2014-11-28 MED ORDER — SODIUM CHLORIDE 0.9 % IV SOLN
10.0000 mg | INTRAVENOUS | Status: DC | PRN
  Administered 2014-11-28: 15 ug/min via INTRAVENOUS

## 2014-11-28 MED ORDER — 0.9 % SODIUM CHLORIDE (POUR BTL) OPTIME
TOPICAL | Status: DC | PRN
  Administered 2014-11-28 (×2): 1000 mL

## 2014-11-28 MED ORDER — PANTOPRAZOLE SODIUM 40 MG IV SOLR
40.0000 mg | INTRAVENOUS | Status: DC
  Administered 2014-11-28: 40 mg via INTRAVENOUS
  Filled 2014-11-28 (×2): qty 40

## 2014-11-28 MED ORDER — KCL IN DEXTROSE-NACL 20-5-0.45 MEQ/L-%-% IV SOLN
INTRAVENOUS | Status: DC
  Administered 2014-11-28 – 2014-12-01 (×7): via INTRAVENOUS
  Administered 2014-12-02: 10 mL/h via INTRAVENOUS
  Administered 2014-12-02: 03:00:00 via INTRAVENOUS
  Filled 2014-11-28 (×15): qty 1000

## 2014-11-28 MED ORDER — FENTANYL CITRATE (PF) 250 MCG/5ML IJ SOLN
INTRAMUSCULAR | Status: AC
Start: 2014-11-28 — End: 2014-11-28
  Filled 2014-11-28: qty 5

## 2014-11-28 MED ORDER — LIDOCAINE HCL (CARDIAC) 20 MG/ML IV SOLN
INTRAVENOUS | Status: AC
  Filled 2014-11-28: qty 5

## 2014-11-28 MED ORDER — FENTANYL CITRATE (PF) 100 MCG/2ML IJ SOLN
INTRAMUSCULAR | Status: DC | PRN
  Administered 2014-11-28 (×5): 50 ug via INTRAVENOUS

## 2014-11-28 MED ORDER — LIDOCAINE HCL (CARDIAC) 20 MG/ML IV SOLN
INTRAVENOUS | Status: DC | PRN
  Administered 2014-11-28: 60 mg via INTRAVENOUS

## 2014-11-28 MED ORDER — DEXTROSE 5 % IV SOLN
2.0000 g | Freq: Once | INTRAVENOUS | Status: AC
  Administered 2014-11-28: 2 g via INTRAVENOUS
  Filled 2014-11-28: qty 2

## 2014-11-28 MED ORDER — ONDANSETRON HCL 4 MG/2ML IJ SOLN
INTRAMUSCULAR | Status: AC
  Filled 2014-11-28: qty 2

## 2014-11-28 MED ORDER — DEXAMETHASONE SODIUM PHOSPHATE 10 MG/ML IJ SOLN
INTRAMUSCULAR | Status: DC | PRN
  Administered 2014-11-28: 5 mg via INTRAVENOUS

## 2014-11-28 MED ORDER — ONDANSETRON HCL 4 MG/2ML IJ SOLN
4.0000 mg | Freq: Four times a day (QID) | INTRAMUSCULAR | Status: DC | PRN
  Administered 2014-11-29 – 2014-11-30 (×2): 4 mg via INTRAVENOUS
  Filled 2014-11-28 (×2): qty 2

## 2014-11-28 MED ORDER — DIPHENHYDRAMINE HCL 50 MG/ML IJ SOLN: 12.5000 mg | Freq: Four times a day (QID) | INTRAMUSCULAR | Status: DC | PRN

## 2014-11-28 MED ORDER — DEXTROSE 5 % IV SOLN
2.0000 g | INTRAVENOUS | Status: AC
  Administered 2014-11-28: 2 g via INTRAVENOUS

## 2014-11-28 MED ORDER — NEOSTIGMINE METHYLSULFATE 10 MG/10ML IV SOLN
INTRAVENOUS | Status: DC | PRN
  Administered 2014-11-28: 4 mg via INTRAVENOUS

## 2014-11-28 MED ORDER — ONDANSETRON HCL 4 MG/2ML IJ SOLN
INTRAMUSCULAR | Status: DC | PRN
  Administered 2014-11-28: 4 mg via INTRAVENOUS

## 2014-11-28 MED ORDER — ONDANSETRON HCL 4 MG PO TABS: 4.0000 mg | ORAL_TABLET | Freq: Four times a day (QID) | ORAL | Status: DC | PRN

## 2014-11-28 MED ORDER — ROCURONIUM BROMIDE 100 MG/10ML IV SOLN
INTRAVENOUS | Status: DC | PRN
  Administered 2014-11-28: 5 mg via INTRAVENOUS
  Administered 2014-11-28: 10 mg via INTRAVENOUS
  Administered 2014-11-28: 40 mg via INTRAVENOUS
  Administered 2014-11-28 (×2): 5 mg via INTRAVENOUS

## 2014-11-28 MED ORDER — SODIUM CHLORIDE 0.9 % IJ SOLN
INTRAMUSCULAR | Status: AC
  Filled 2014-11-28: qty 20

## 2014-11-28 MED ORDER — ROCURONIUM BROMIDE 100 MG/10ML IV SOLN
INTRAVENOUS | Status: AC
  Filled 2014-11-28: qty 1

## 2014-11-28 MED ORDER — PROPOFOL 10 MG/ML IV BOLUS
INTRAVENOUS | Status: DC | PRN
  Administered 2014-11-28: 120 mg via INTRAVENOUS

## 2014-11-28 MED ORDER — PROMETHAZINE HCL 25 MG/ML IJ SOLN
6.2500 mg | INTRAMUSCULAR | Status: DC | PRN
Start: 2014-11-28 — End: 2014-11-28

## 2014-11-28 MED ORDER — MIDAZOLAM HCL 2 MG/2ML IJ SOLN
INTRAMUSCULAR | Status: AC
  Filled 2014-11-28: qty 2

## 2014-11-28 MED ORDER — ALBUMIN HUMAN 5 % IV SOLN
INTRAVENOUS | Status: DC | PRN
  Administered 2014-11-28 (×2): via INTRAVENOUS

## 2014-11-28 MED ORDER — ACETAMINOPHEN 10 MG/ML IV SOLN
1000.0000 mg | Freq: Four times a day (QID) | INTRAVENOUS | Status: DC
  Administered 2014-11-29 (×2): 1000 mg via INTRAVENOUS
  Filled 2014-11-28 (×6): qty 100

## 2014-11-28 MED ORDER — HEMOSTATIC AGENTS (NO CHARGE) OPTIME
TOPICAL | Status: DC | PRN
  Administered 2014-11-28: 1 via TOPICAL

## 2014-11-28 MED ORDER — ALVIMOPAN 12 MG PO CAPS
12.0000 mg | ORAL_CAPSULE | Freq: Once | ORAL | Status: AC
  Administered 2014-11-28: 12 mg via ORAL
  Filled 2014-11-28: qty 1

## 2014-11-28 MED ORDER — PROPOFOL 10 MG/ML IV BOLUS
INTRAVENOUS | Status: AC
  Filled 2014-11-28: qty 20

## 2014-11-28 MED ORDER — DEXTROSE 5 % IV SOLN
INTRAVENOUS | Status: AC
  Filled 2014-11-28: qty 2

## 2014-11-28 MED ORDER — BUPIVACAINE LIPOSOME 1.3 % IJ SUSP
20.0000 mL | Freq: Once | INTRAMUSCULAR | Status: AC
  Administered 2014-11-28: 20 mL
  Filled 2014-11-28: qty 20

## 2014-11-28 MED ORDER — SODIUM CHLORIDE 0.9 % IJ SOLN
INTRAMUSCULAR | Status: DC | PRN
  Administered 2014-11-28: 20 mL

## 2014-11-28 MED ORDER — ENOXAPARIN SODIUM 40 MG/0.4ML ~~LOC~~ SOLN
40.0000 mg | SUBCUTANEOUS | Status: DC
  Administered 2014-11-29 – 2014-12-04 (×6): 40 mg via SUBCUTANEOUS
  Filled 2014-11-28 (×8): qty 0.4

## 2014-11-28 MED ORDER — DIPHENHYDRAMINE HCL 12.5 MG/5ML PO ELIX
12.5000 mg | ORAL_SOLUTION | Freq: Four times a day (QID) | ORAL | Status: DC | PRN
  Filled 2014-11-28: qty 5

## 2014-11-28 SURGICAL SUPPLY — 45 items
ATTRACTOMAT 16X20 MAGNETIC DRP (DRAPES) IMPLANT
BLADE EXTENDED COATED 6.5IN (ELECTRODE) ×4 IMPLANT
CHLORAPREP W/TINT 26ML (MISCELLANEOUS) ×4 IMPLANT
CLIP TI LARGE 6 (CLIP) ×4 IMPLANT
CLIP TI MEDIUM 6 (CLIP) ×4 IMPLANT
CLIP TI MEDIUM LARGE 6 (CLIP) ×8 IMPLANT
CONT SPEC 4OZ CLIKSEAL STRL BL (MISCELLANEOUS) IMPLANT
COUNTER NEEDLE 20 DBL MAG RED (NEEDLE) ×4 IMPLANT
COVER SURGICAL LIGHT HANDLE (MISCELLANEOUS) ×4 IMPLANT
DRAPE INCISE IOBAN 66X45 STRL (DRAPES) ×4 IMPLANT
DRAPE WARM FLUID 44X44 (DRAPE) ×4 IMPLANT
DRSG OPSITE POSTOP 4X12 (GAUZE/BANDAGES/DRESSINGS) ×4 IMPLANT
DRSG OPSITE POSTOP 4X8 (GAUZE/BANDAGES/DRESSINGS) ×4 IMPLANT
DRSG TEGADERM 6X8 (GAUZE/BANDAGES/DRESSINGS) ×4 IMPLANT
ELECT REM PT RETURN 9FT ADLT (ELECTROSURGICAL) ×4
ELECTRODE REM PT RTRN 9FT ADLT (ELECTROSURGICAL) ×3 IMPLANT
FLOSEAL 10ML (HEMOSTASIS) ×4 IMPLANT
GAUZE SPONGE 4X4 12PLY STRL (GAUZE/BANDAGES/DRESSINGS) IMPLANT
GAUZE SPONGE 4X4 16PLY XRAY LF (GAUZE/BANDAGES/DRESSINGS) IMPLANT
GLOVE BIO SURGEON STRL SZ 6.5 (GLOVE) ×8 IMPLANT
GLOVE BIOGEL M STRL SZ7.5 (GLOVE) ×8 IMPLANT
GOWN STRL REUS W/ TWL LRG LVL3 (GOWN DISPOSABLE) ×9 IMPLANT
GOWN STRL REUS W/TWL LRG LVL3 (GOWN DISPOSABLE) ×3
KIT BASIN OR (CUSTOM PROCEDURE TRAY) ×4 IMPLANT
LIGASURE IMPACT 36 18CM CVD LR (INSTRUMENTS) ×4 IMPLANT
LOOP VESSEL MAXI BLUE (MISCELLANEOUS) ×4 IMPLANT
NS IRRIG 1000ML POUR BTL (IV SOLUTION) IMPLANT
PACK GENERAL/GYN (CUSTOM PROCEDURE TRAY) ×4 IMPLANT
SHEET LAVH (DRAPES) ×4 IMPLANT
SPONGE LAP 18X18 X RAY DECT (DISPOSABLE) ×8 IMPLANT
STAPLER VISISTAT 35W (STAPLE) ×8 IMPLANT
SUT PDS AB 1 TP1 96 (SUTURE) ×8 IMPLANT
SUT SILK 2 0 (SUTURE) ×1
SUT SILK 2-0 30XBRD TIE 12 (SUTURE) ×3 IMPLANT
SUT VIC AB 0 CT1 36 (SUTURE) ×16 IMPLANT
SUT VIC AB 2-0 CT2 27 (SUTURE) ×28 IMPLANT
SUT VIC AB 2-0 SH 27 (SUTURE) ×2
SUT VIC AB 2-0 SH 27X BRD (SUTURE) ×6 IMPLANT
SUT VIC AB 3-0 SH 27 (SUTURE) ×2
SUT VIC AB 3-0 SH 27X BRD (SUTURE) ×6 IMPLANT
SUT VICRYL 2 0 18  UND BR (SUTURE) ×1
SUT VICRYL 2 0 18 UND BR (SUTURE) ×3 IMPLANT
TOWEL OR 17X26 10 PK STRL BLUE (TOWEL DISPOSABLE) ×4 IMPLANT
TOWEL OR NON WOVEN STRL DISP B (DISPOSABLE) ×4 IMPLANT
TRAY FOLEY W/METER SILVER 14FR (SET/KITS/TRAYS/PACK) ×4 IMPLANT

## 2014-11-28 NOTE — Transfer of Care (Signed)
Immediate Anesthesia Transfer of Care Note  Patient: Kathleen Bond  Procedure(s) Performed: Procedure(s): TOTAL HYSTERECTOMY ABDOMINAL/EXPLORATORY LAPAROTOMY (N/A) BILATERAL SALPINGO OOPHORECTOMY/OMENTECTOMY/POSSIBLE BOWEL RESECTION (Bilateral) TUMOR DEBULKING OMENTECTOMY  Patient Location: PACU  Anesthesia Type:General  Level of Consciousness: awake, alert  and oriented  Airway & Oxygen Therapy: Patient Spontanous Breathing and Patient connected to face mask oxygen  Post-op Assessment: Report given to RN and Post -op Vital signs reviewed and stable  Post vital signs: Reviewed and stable  Last Vitals:  Filed Vitals:   11/28/14 1159  BP: 123/81  Pulse: 84  Temp: 36.6 C  Resp: 16    Complications: No apparent anesthesia complications

## 2014-11-28 NOTE — Anesthesia Postprocedure Evaluation (Signed)
Anesthesia Post Note  Patient: Kathleen Bond  Procedure(s) Performed: Procedure(s) (LRB): TOTAL HYSTERECTOMY ABDOMINAL/EXPLORATORY LAPAROTOMY (N/A) BILATERAL SALPINGO OOPHORECTOMY/OMENTECTOMY/POSSIBLE BOWEL RESECTION (Bilateral) TUMOR DEBULKING OMENTECTOMY  Anesthesia type: general  Patient location: PACU  Post pain: Pain level controlled  Post assessment: Patient's Cardiovascular Status Stable  Last Vitals:  Filed Vitals:   11/28/14 1907  BP: 125/78  Pulse: 76  Temp: 36.5 C  Resp: 16    Post vital signs: Reviewed and stable  Level of consciousness: sedated  Complications: No apparent anesthesia complications

## 2014-11-28 NOTE — Anesthesia Procedure Notes (Signed)
Procedure Name: Intubation Date/Time: 11/28/2014 1:43 PM Performed by: Sherian Maroon A Pre-anesthesia Checklist: Patient identified, Emergency Drugs available, Suction available, Patient being monitored and Timeout performed Patient Re-evaluated:Patient Re-evaluated prior to inductionOxygen Delivery Method: Circle system utilized Preoxygenation: Pre-oxygenation with 100% oxygen Intubation Type: IV induction Ventilation: Mask ventilation without difficulty Grade View: Grade IV Tube type: Oral Tube size: 7.0 mm Number of attempts: 1 Airway Equipment and Method: Video-laryngoscopy Placement Confirmation: ETT inserted through vocal cords under direct vision,  positive ETCO2 and breath sounds checked- equal and bilateral Secured at: 20 cm Tube secured with: Tape Dental Injury: Teeth and Oropharynx as per pre-operative assessment

## 2014-11-28 NOTE — Op Note (Signed)
OPERATIVE NOTE DATE: 11/28/14  Preoperative Diagnosis: stage IV ovarian cancer, s/p neoadjuvant chemotherapy   Postoperative Diagnosis:same    Procedure(s) Performed: Exploratory laparotomy with total abdominal hysterectomy, bilateral salpingo-oophorectomy, omentectomy radical tumor debulking for ovarian cancer .  Surgeon: Thereasa Solo, MD.  Assistant Surgeon: Lahoma Crocker, M.D. Assistant: (an MD assistant was necessary for tissue manipulation, retraction and positioning due to the complexity of the case and hospital policies).   Specimens: Uterus, bilateral tubes / ovaries, omentum.    Estimated Blood Loss: 450 mL.    Urine [id number]  Complications: None.   Operative Findings: fine millial studding overlying entirity of small bowel. Adhesions between liver and right diaphragm. 10cm tumor plaque on omentum adherent to left lobe of liver and greater curvature of stomach. Bilateral ovaries densely adherent to rectosigmoid. Retroperitoneal infiltration bilaterally.   This represented an optimal cytoreduction (R1) with disease remaining on the small bowel surface as millial implants.   Procedure:   The patient was seen in the Holding Room. The risks, benefits, complications, treatment options, and expected outcomes were discussed with the patient.  The patient concurred with the proposed plan, giving informed consent.   The patient was  identified as Kathleen Bond  and the procedure verified asTAH, BSO, omentectomy, tumor debulking. A Time Out was held and the above information confirmed upon entry to the operating room..  After induction of anesthesia, the patient was draped and prepped in the usual sterile manner.  She was prepped and draped in the normal sterile fashion in the dorsal lithotomy position in padded Allen stirrups with good attention paid to support of the lower back and lower extremities. Position was adjusted for appropriate support. A Foley catheter was placed to  gravity.   A midline vertical incision was made and carried through the subcutaneous tissue to the fascia. The fascial incision was made and extended superiorally. The rectus muscles were separated. The peritoneum was identified and entered. Peritoneal incision was extended longitudinally.  The abdominal cavity was entered sharply and without incident. A Bookwalter retractor was then placed. A survey of the abdomen and pelvis revealed the above findings, which were significant for tumor residual at the ovaries adherent with colon, tumor in the omentum adherent with liver and stomach, anterior abdominal wall nodule.  The omental cake was dissected free from the adherent loops of small intestine and transverse colon from the hepatic flexure to the splenic flexure using sharp metzenbaum scissor dissection. One area of deserosalization (thinning) of the jejunal bowel wall was oversewn with 3-0vicryl imbricating suture.The lesser sac was entered. The tumor cake was separated from the mesentery of the transverse colon. The short gastric vessels were sealed with ligasure and silk ties and the infragastric omentum was separated from the greater curvature of the stomach removing all bulky tumor. The electrosurgical device was used to separate the omentum from the liver. The falciform ligament was resected using ligasure to facilitate mobilization of the omentum and liver. Hemostasis was confirmed. The colon was closely inspected and was noted to be intact and hemostatic.   After packing the small bowel into the upper abdomen, we began with a right salpingo-oophorectomy by entering the  pelvic sidewall just posterior to the right round ligament. The pararectal space was developed and the retroperitoneum developed up to the level of the common iliac artery.  The course of the ureter was identified with ease. Due to the retroperitoneal fibrosis and the adherence of the ureter to the right ovary and medial leaf, it  was  skeletonized and marked for manipulation with a vessl loop. The right ureter was dissected from its tunnel under the uterine artery on the right which was clipped with heme clips at its origin and transected at this level. The rectum was taken down from its attachments/adhesions to the uterus and ovaries with metzenbaum scissors. The right IP was then skeletonized, and sealed and transected with ligasure. The ovary was separated from its peritoneal attachments with the bovie with visualization of the ureter at all times. The bladder flap was created with the bovie and meticulous sharp dissection. It was taken down to below the level of the cervix.   The sigmoid colon was dissected from its dense tumor attachments to the left ovary using sharp dissection. The left retroperitoneal peritoneum was entered parallel to the sigmoid colon attachments and the left ureter was identified in the left retroperitoneal space. Retroperitoneal fibrosis was encountered secondary to tumor reaction. The ureter was skeletonized and manipulated with the vessel loop. It was manipulated to facilitate sharp dissection to free it from the left ovary. Using sharp and monopolar dissection, the left tube and ovary were freed from their peritoneal adhesions to the pelvis and sigmoid colon. The IP ligament was sealed with ligasure. The uterine vessels were skeletonized bilaterally at the uterine isthmus. They were clamped, transected and suture ligated. Straight hysterectomy clamps were placed vertically down the cardinal ligaments until the vaginal apex was approached. At the cervicovaginal junction curved clamps were passed across the vagina. The vagina was transected and the specimen removed. The cuff was closed with 0 vicryl figure of eight sutures, hemostatically.  Hemostasis was reinforced at the vaginal apices and cardinal ligament pedicles with 2-0 vicryl.  The peritoneal cavity was irrigated and hemostasis was confirmed at all  surgical sites.  Residual tumor was present at the small bowel with tiny, <31mm millial studding throughout.   The fascia was reapproximated with 0 looped PDS using a total of two sutures. The subcutaneous layer was then irrigated copiously.  Exparel long acting local anesthetic was infiltrated into the subcutaneous tissues. The skin was closed with staples. The patient tolerated the procedure well.   Sponge, lap and needle counts were correct x 2.  [name], MD

## 2014-11-28 NOTE — Anesthesia Preprocedure Evaluation (Addendum)
Anesthesia Evaluation  Patient identified by MRN, date of birth, ID band Patient awake    History of Anesthesia Complications Negative for: history of anesthetic complications  Airway Mallampati: III  TM Distance: >3 FB Neck ROM: Full    Dental  (+) Teeth Intact, Dental Advisory Given   Pulmonary neg pulmonary ROS,    Pulmonary exam normal       Cardiovascular hypertension, Pt. on medications Normal cardiovascular exam    Neuro/Psych negative neurological ROS  negative psych ROS   GI/Hepatic negative GI ROS, Neg liver ROS,   Endo/Other  Hypothyroidism   Renal/GU negative Renal ROS     Musculoskeletal   Abdominal   Peds  Hematology   Anesthesia Other Findings   Reproductive/Obstetrics                         Anesthesia Physical Anesthesia Plan  ASA: III  Anesthesia Plan: General   Post-op Pain Management:    Induction: Intravenous  Airway Management Planned: Oral ETT and Video Laryngoscope Planned  Additional Equipment:   Intra-op Plan:   Post-operative Plan: Extubation in OR  Informed Consent: I have reviewed the patients History and Physical, chart, labs and discussed the procedure including the risks, benefits and alternatives for the proposed anesthesia with the patient or authorized representative who has indicated his/her understanding and acceptance.   Dental advisory given  Plan Discussed with: CRNA, Anesthesiologist and Surgeon  Anesthesia Plan Comments:       Anesthesia Quick Evaluation

## 2014-11-28 NOTE — H&P (Signed)
GYNECOLOGIC ONCOLOGY H&P  HPI: Kathleen Bond is a 64 y.o. year old G0 initially seen in consultation on 07/25/14 for stage IIIC ovarian cancer. She then underwent an exploratory laparotomy and omental biopsy on 08/15/14. It was complicated by inability to complete a debulking procedure due to extensive peritoneal involvement of small and large intestine and complete peritoneal involvement with tumor. Her postoperative course was uncomplicated and she was started on cycle 1 of paclitaxel and carboplatin on 08/18/14. 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  She went on to receive 5 cycles of neoadjuvant chemotherapy with paclitaxel and carboplatin (cycle 5 on 11/09/14).  CA 125 assessments with each cycle have shown considerable positive response (from 1162 upon commencement, to 76 on 10/05/14 after cycle 3, to 8 on 11/23/14 after cycle 5).  CT of the chest, abdomen and pelvis on 10/16/14 revealed good response to therapy with significant decrease in pelvic mass size (5cm), resolution of ascites and lymphadenopathy, persistent but signifcantly reduced omental cake. The CT did show a left breast increased density. She feels it is the same from post-treatment for breast cancer. She is having a followup mammogram in 1 week.  Interval history: She is tolerating chemotherapy very well. Denies neuropathy. She has good energy level. ECOG performance status 0.  She is eating well and gaining weight.   Review of systems: Constitutional: She has no weight gain or weight loss. She has no fever or chills. Eyes: No blurred vision Ears, Nose, Mouth, Throat: No dizziness, headaches or changes in hearing. No mouth sores. Cardiovascular: No chest pain, palpitations or edema. Respiratory: No shortness of breath, wheezing or cough Gastrointestinal: She has normal bowel movements without diarrhea or constipation. She denies any nausea or vomiting. She denies blood in her stool or heart  burn. Genitourinary: She denies pelvic pain, pelvic pressure or changes in her urinary function. She has no hematuria, dysuria, or incontinence. She has no irregular vaginal bleeding or vaginal discharge Musculoskeletal: Denies muscle weakness or joint pains.  Skin: She has no skin changes, rashes or itching Neurological: Denies dizziness or headaches. No neuropathy, no numbness or tingling. Psychiatric: She denies depression or anxiety. Hematologic/Lymphatic: No easy bruising or bleeding   Physical Exam: Blood pressure 114/83, pulse 70, temperature 98.5 F (36.9 C), temperature source Oral, resp. rate 18, height 5\' 2"  (1.575 m), weight 117 lb 4.8 oz (53.207 kg). General: Well dressed, well nourished in no apparent distress.  HEENT: Normocephalic and atraumatic, no lesions. Extraocular muscles intact. Sclerae anicteric. Pupils equal, round, reactive. No mouth sores or ulcers. Thyroid is normal size, not nodular, midline. Skin: No lesions or rashes. Breasts: Firmness left breast consistent with CT findings. Not well circumscribed. Lungs: Clear to auscultation bilaterally. No wheezes. Cardiovascular: Regular rate and rhythm. No murmurs or rubs. Abdomen: Soft, nontender, nondistended. No palpable masses. No hepatosplenomegaly. No ascites. Normal bowel sounds. No hernias. Incision is healed. No infection. Genitourinary: Normal EGBUS Vaginal cuff intact. No bleeding or discharge. No cul de sac fullness. Rectal exam reveals free with no nodularity. Significant improvement in tethered/narrowed exam from pre-chemotherapy. Extremities: No cyanosis, clubbing or edema. No calf tenderness or erythema. No palpable cords. Psychiatric: Mood and affect are appropriate. Neurological: Awake, alert and oriented x 3. Sensation is intact, no neuropathy.  Musculoskeletal: No pain, normal strength and range of motion.  CBC    Component Value Date/Time   WBC 14.9* 11/23/2014 1520    WBC 6.6 08/17/2014 0605   RBC 2.98* 11/23/2014 1520  RBC 3.87 08/17/2014 0605   HGB 9.3* 11/23/2014 1520   HGB 10.7* 08/17/2014 0605   HCT 28.8* 11/23/2014 1520   HCT 33.5* 08/17/2014 0605   PLT 196 11/23/2014 1520   PLT 356 08/17/2014 0605   MCV 96.6 11/23/2014 1520   MCV 86.6 08/17/2014 0605   MCH 31.2 11/23/2014 1520   MCH 27.6 08/17/2014 0605   MCHC 32.3 11/23/2014 1520   MCHC 31.9 08/17/2014 0605   RDW 20.4* 11/23/2014 1520   RDW 14.0 08/17/2014 0605   LYMPHSABS 2.5 11/23/2014 1520   LYMPHSABS 0.8 08/17/2014 0605   MONOABS 1.2* 11/23/2014 1520   MONOABS 0.7 08/17/2014 0605   EOSABS 0.0 11/23/2014 1520   EOSABS 0.0 08/17/2014 0605   BASOSABS 0.0 11/23/2014 1520   BASOSABS 0.0 08/17/2014 4315     Assessment:  64 y.o. year old with initially inoperable stage IIIC high grade ovarian carcinoma. S/p exploratory laparotomy and omental biopsy on 08/15/14. S/p 5 cycles neoadjuvant chemotherapy with excellent clinical response (incomplete). Labs appropriate to proceed with surgery. Imaging reviewed. Plan: 1) Plan for interval cytoreductive effort with TAH, BSO, omentectomy and tumor debulking. Lovenox x 28 days postop. 2) postop chemotherapy (3 cycles of carb/taxol).  [name], MD

## 2014-11-29 ENCOUNTER — Encounter (HOSPITAL_COMMUNITY): Payer: Self-pay | Admitting: Gynecologic Oncology

## 2014-11-29 LAB — BASIC METABOLIC PANEL
Anion gap: 14 (ref 5–15)
BUN: 12 mg/dL (ref 6–20)
CO2: 21 mmol/L — AB (ref 22–32)
Calcium: 8.4 mg/dL — ABNORMAL LOW (ref 8.9–10.3)
Chloride: 101 mmol/L (ref 101–111)
Creatinine, Ser: 0.56 mg/dL (ref 0.44–1.00)
GFR calc Af Amer: >60 mL/min (ref >60)
GFR calc non Af Amer: >60 mL/min (ref >60)
Glucose, Bld: 159 mg/dL — ABNORMAL HIGH (ref 70–99)
POTASSIUM: 4.7 mmol/L (ref 3.5–5.1)
SODIUM: 136 mmol/L (ref 135–145)

## 2014-11-29 LAB — CBC
HEMATOCRIT: 24.2 % — AB (ref 36.0–46.0)
HEMOGLOBIN: 7.9 g/dL — AB (ref 12.0–15.0)
MCH: 31.6 pg (ref 26.0–34.0)
MCHC: 32.6 g/dL (ref 30.0–36.0)
MCV: 96.8 fL (ref 78.0–100.0)
Platelets: 163 10*3/uL (ref 150–400)
RBC: 2.5 MIL/uL — ABNORMAL LOW (ref 3.87–5.11)
RDW: 19 % — ABNORMAL HIGH (ref 11.5–15.5)
WBC: 16.3 10*3/uL — ABNORMAL HIGH (ref 4.0–10.5)

## 2014-11-29 MED ORDER — FUROSEMIDE 20 MG PO TABS
20.0000 mg | ORAL_TABLET | Freq: Every day | ORAL | Status: DC
  Filled 2014-11-29 (×2): qty 1

## 2014-11-29 MED ORDER — LEVOTHYROXINE SODIUM 150 MCG PO TABS
150.0000 ug | ORAL_TABLET | Freq: Every day | ORAL | Status: DC
  Administered 2014-11-29 – 2014-12-04 (×6): 150 ug via ORAL
  Filled 2014-11-29 (×8): qty 1

## 2014-11-29 MED ORDER — DOCUSATE SODIUM 100 MG PO CAPS: 100.0000 mg | ORAL_CAPSULE | Freq: Every day | ORAL | Status: DC | PRN

## 2014-11-29 MED ORDER — IBUPROFEN 800 MG PO TABS
800.0000 mg | ORAL_TABLET | Freq: Four times a day (QID) | ORAL | Status: DC
  Administered 2014-11-29 (×2): 800 mg via ORAL
  Filled 2014-11-29 (×7): qty 1

## 2014-11-29 MED ORDER — ACETAMINOPHEN 500 MG PO TABS
1000.0000 mg | ORAL_TABLET | Freq: Four times a day (QID) | ORAL | Status: DC
  Administered 2014-11-29 (×2): 1000 mg via ORAL
  Filled 2014-11-29 (×7): qty 2

## 2014-11-29 MED ORDER — OXYCODONE HCL 5 MG PO TABS
5.0000 mg | ORAL_TABLET | ORAL | Status: DC | PRN
  Administered 2014-11-29: 5 mg via ORAL
  Filled 2014-11-29: qty 1

## 2014-11-29 MED ORDER — ENSURE ENLIVE PO LIQD
237.0000 mL | Freq: Two times a day (BID) | ORAL | Status: DC
  Administered 2014-11-29: 237 mL via ORAL

## 2014-11-29 MED ORDER — PANTOPRAZOLE SODIUM 40 MG PO TBEC
40.0000 mg | DELAYED_RELEASE_TABLET | Freq: Every day | ORAL | Status: DC
  Administered 2014-11-29: 40 mg via ORAL
  Filled 2014-11-29 (×2): qty 1

## 2014-11-29 NOTE — Progress Notes (Signed)
Telephone order from [facility] to discontinue patient's pca and place order for oxycodone 5 mg every 4 hours as needed for pain, also to decrease current IV fluids to 60ml/hr, orders entered Neta Mends RN 11-29-2014 16:07

## 2014-11-29 NOTE — Progress Notes (Signed)
Patient ID: Kathleen Bond, female   DOB: January 25, 1951, 64 y.o.   MRN: [id number] 1 Day Post-Op Procedure(s) (LRB): TOTAL HYSTERECTOMY ABDOMINAL/EXPLORATORY LAPAROTOMY (N/Bond) BILATERAL SALPINGO OOPHORECTOMY/OMENTECTOMY/POSSIBLE BOWEL RESECTION (Bilateral) TUMOR DEBULKING OMENTECTOMY  Subjective: Patient reports incisional pain   Objective: Vital signs in last 24 hours: Temp:  [97.7 F (36.5 C)-99.3 F (37.4 C)] 97.9 F (36.6 C) (05/11 1002) Pulse Rate:  [65-92] 86 (05/11 1002) Resp:  [12-20] 18 (05/11 1002) BP: (115-151)/(53-88) 134/76 mmHg (05/11 1002) SpO2:  [96 %-100 %] 100 % (05/11 1002) FiO2 (%):  [36 %-39 %] 39 % (05/11 0444) Weight:  [123 lb 9.6 oz (56.065 kg)] 123 lb 9.6 oz (56.065 kg) (05/10 1813) Last BM Date: 11/27/14  Intake/Output from previous day: 05/10 0701 - 05/11 0700 In: 4652.1 [I.V.:4152.1; IV Piggyback:500] Out: 2250 [Urine:1800; Blood:450]  Physical Examination: General: alert Resp: clear to auscultation bilaterally Cardio: regular rate and rhythm, S1, S2 normal, no murmur, click, rub or gallop GI: soft, non-tender; bowel sounds hypoactive; no masses,  no organomegaly and incision: clean, dry and intact Extremities: extremities normal, atraumatic, no cyanosis or edema and Homans sign is negative, no sign of DVT  Labs: WBC/Hgb/Hct/Plts:  16.3/7.9/24.2/163 (05/11 0445) BUN/Cr/glu/ALT/AST/amyl/lip:  12/0.56/--/--/--/--/-- (05/11 0400)   Assessment:  64 y.o. s/p Procedure(s): TOTAL HYSTERECTOMY ABDOMINAL/EXPLORATORY LAPAROTOMY BILATERAL SALPINGO OOPHORECTOMY/OMENTECTOMY/POSSIBLE BOWEL RESECTION TUMOR DEBULKING OMENTECTOMY: stable Pain:  Pain is well-controlled on PCA .  Heme: Anemia: stable/asymptomatic  CV: Hypertension:  controlled. Current treatment:  enalapril (Vasotec).  GI:  Tolerating po: No: NGT in place  FEN: Electrolytes in range  Prophylaxis: pharmacologic prophylaxis (with any of the following: enoxaparin (Lovenox) 40mg  SQ 2 hours  prior to surgery then every day) and intermittent pneumatic compression boots.  Plan: D/C NGT Monitor for symptoms secondary to anemia Start Bond low residue diet; advance as tolerated Ambulate D/C PCA/IVF when tolerating po   LOS: 1 day    Kathleen Bond 11/29/2014, 11:14 AM

## 2014-11-29 NOTE — Care Management Note (Signed)
Case Management Note  Patient Details  Name: Kathleen Bond MRN: [id number] Date of Birth: Nov 26, 1950  Subjective/Objective:                 Admitted s/p exploratory lap with TAH, BSO, omentectomy radical tumor debulking for ovarian cancer   Action/Plan: Discharge planning  Expected Discharge Date:  12/01/14               Expected Discharge Plan:  Home/Self Care  In-House Referral:  NA  Discharge planning Services  CM Consult  Status of Service:  Completed, signed off  Medicare Important Message Given:    Date Medicare IM Given:    Medicare IM give by:    Date Additional Medicare IM Given:    Additional Medicare Important Message give by:     If discussed at [facility] of Stay Meetings, dates discussed:    Additional Comments:  Guadalupe Maple, RN 11/29/2014, 9:07 AM

## 2014-11-30 LAB — CBC
HCT: 23.1 % — ABNORMAL LOW (ref 36.0–46.0)
Hemoglobin: 7.4 g/dL — ABNORMAL LOW (ref 12.0–15.0)
MCH: 31.8 pg (ref 26.0–34.0)
MCHC: 32 g/dL (ref 30.0–36.0)
MCV: 99.1 fL (ref 78.0–100.0)
PLATELETS: 196 10*3/uL (ref 150–400)
RBC: 2.33 MIL/uL — ABNORMAL LOW (ref 3.87–5.11)
RDW: 18.9 % — ABNORMAL HIGH (ref 11.5–15.5)
WBC: 8.5 10*3/uL (ref 4.0–10.5)

## 2014-11-30 LAB — BASIC METABOLIC PANEL
ANION GAP: 10 (ref 5–15)
BUN: 6 mg/dL (ref 6–20)
CO2: 26 mmol/L (ref 22–32)
CREATININE: 0.57 mg/dL (ref 0.44–1.00)
Calcium: 9 mg/dL (ref 8.9–10.3)
Chloride: 102 mmol/L (ref 101–111)
GFR calc non Af Amer: >60 mL/min (ref >60)
Glucose, Bld: 133 mg/dL — ABNORMAL HIGH (ref 65–99)
Potassium: 4 mmol/L (ref 3.5–5.1)
Sodium: 138 mmol/L (ref 135–145)

## 2014-11-30 LAB — URINALYSIS, ROUTINE W REFLEX MICROSCOPIC
Bilirubin Urine: NEGATIVE
Glucose, UA: NEGATIVE mg/dL
Ketones, ur: NEGATIVE mg/dL
LEUKOCYTES UA: NEGATIVE
NITRITE: NEGATIVE
Protein, ur: NEGATIVE mg/dL
SPECIFIC GRAVITY, URINE: 1.008 (ref 1.005–1.030)
UROBILINOGEN UA: 0.2 mg/dL (ref 0.0–1.0)
pH: 7 (ref 5.0–8.0)

## 2014-11-30 LAB — URINE MICROSCOPIC-ADD ON

## 2014-11-30 LAB — TROPONIN I

## 2014-11-30 MED ORDER — PROMETHAZINE HCL 25 MG/ML IJ SOLN
12.5000 mg | Freq: Once | INTRAMUSCULAR | Status: AC
  Administered 2014-11-30: 12.5 mg via INTRAVENOUS
  Filled 2014-11-30: qty 1

## 2014-11-30 MED ORDER — SODIUM CHLORIDE 0.9 % IV BOLUS (SEPSIS)
500.0000 mL | Freq: Once | INTRAVENOUS | Status: AC
  Administered 2014-11-30: 500 mL via INTRAVENOUS

## 2014-11-30 MED ORDER — SODIUM CHLORIDE 0.9 % IV BOLUS (SEPSIS)
250.0000 mL | Freq: Once | INTRAVENOUS | Status: AC
  Administered 2014-11-30: 250 mL via INTRAVENOUS

## 2014-11-30 MED ORDER — PANTOPRAZOLE SODIUM 40 MG IV SOLR
40.0000 mg | INTRAVENOUS | Status: DC
  Administered 2014-11-30 – 2014-12-02 (×3): 40 mg via INTRAVENOUS
  Filled 2014-11-30 (×3): qty 40

## 2014-11-30 MED ORDER — HYDROMORPHONE HCL 2 MG/ML IJ SOLN
INTRAMUSCULAR | Status: AC
  Filled 2014-11-30: qty 1

## 2014-11-30 NOTE — Progress Notes (Signed)
2 Days Post-Op Procedure(s) (LRB): TOTAL HYSTERECTOMY ABDOMINAL/EXPLORATORY LAPAROTOMY (N/A) BILATERAL SALPINGO OOPHORECTOMY/OMENTECTOMY/POSSIBLE BOWEL RESECTION (Bilateral) TUMOR DEBULKING OMENTECTOMY  Subjective: Patient reports nausea with emesis last pm.  No nausea reported this am.  Abdominal soreness reported with adequate pain relief with PRN medications.  Denies chest pain, dyspnea, passing flatus, or having a bowel movement.  No concerns voiced.  Objective: Vital signs in last 24 hours: Temp:  [97.9 F (36.6 C)-99.3 F (37.4 C)] 99.3 F (37.4 C) (05/12 0535) Pulse Rate:  [77-143] 143 (05/12 0943) Resp:  [15-18] 16 (05/12 0535) BP: (98-134)/(61-82) 123/69 mmHg (05/12 0943) SpO2:  [98 %-100 %] 100 % (05/12 0535) Last BM Date: 11/27/14  Intake/Output from previous day: 05/11 0701 - 05/12 0700 In: 1997.5 [I.V.:1997.5] Out: 2075 [Urine:2050; Emesis/NG output:25]  Physical Examination: General: alert, cooperative and no distress Resp: clear to auscultation bilaterally Cardio: tachycardic at 607, no murmurs, clicks, or rubs GI: incision: midline incision with dressing, no drainage or erythema noted and abdomen soft, faint bowel sounds, non-distended Extremities: extremities normal, atraumatic, no cyanosis or edema  Labs: WBC/Hgb/Hct/Plts:  8.5/7.4/23.1/196 (05/12 0810) BUN/Cr/glu/ALT/AST/amyl/lip:  6/0.57/--/--/--/--/-- (05/12 3710)  Assessment: 64 y.o. s/p Procedure(s): TOTAL HYSTERECTOMY ABDOMINAL/EXPLORATORY LAPAROTOMY BILATERAL SALPINGO OOPHORECTOMY/OMENTECTOMY/POSSIBLE BOWEL RESECTION TUMOR DEBULKING OMENTECTOMY: stable Pain:  Pain is well-controlled on PRN medications.  Heme: Hgb 7.4 and Hct 23.1 this am: stable    CV: Hx HTN.  Tachycardic this am with pulse at 140 bpm.  Bolus currently running per Dr. Delsa Sale.  GI:  Tolerating po: sips.  Decreased PO intake due to nausea/emesis.  GU: Adequate output reported: 2050 cc out.    FEN: Stable  post-operatively.  Prophylaxis: pharmacologic prophylaxis (with any of the following: enoxaparin (Lovenox) 40mg  SQ 2 hours prior to surgery then every day) and intermittent pneumatic compression boots.  Plan: Bolus per Dr. Merrilee Jansky.  IVF to 175 cc/hr Urine sent for analysis Obtain EKG due to tachycardia Encourage ambulation, IS use, deep breathing, and coughing Continue post-operative plan of care per Dr. Delsa Sale Update:Plan to repeat 500 cc bolus due to HR of 140   LOS: 2 days    Kathleen Bond 11/30/2014, 9:44 AM

## 2014-11-30 NOTE — Plan of Care (Signed)
Problem: Phase II Progression Outcomes Goal: Afebrile, VS remain stable Outcome: Progressing Pt tachy at 129 this am- dr. Delsa Sale in.  Bolus of 500cc NS given and U/A sent to lab

## 2014-11-30 NOTE — Clinical Documentation Improvement (Signed)
MD's, NP's, and PA's   Noted documentation of "anemia"  please provide greater specificity of type of anemia if appropriate for this admission. Thank you    Possible Clinical Conditions?  Acute Blood Loss Anemia Aplastic anemia Precipitous drop in Hematocrit Acute on chronic blood loss anemia Anemia in/due to Neoplastic disease Pernicious Anemia Normocytic Anemia  Other Condition  Cannot Clinically Determine     Diagnostics:H/H   Component     Latest Ref Rng 11/23/2014 11/28/2014 11/29/2014 11/30/2014  Hemoglobin     12.0 - 15.0 g/dL 9.3 (L) 8.5 (L) 7.9 (L) 7.4 (L)  HCT     36.0 - 46.0 % 28.8 (L) 25.0 (L) 24.2 (L) 23.1 (L)   Treatments: IVF @ 50 /hr  Thank You, Ree Kida ,RN Clinical Documentation Specialist:  [facility] Information [facility]

## 2014-12-01 ENCOUNTER — Inpatient Hospital Stay (HOSPITAL_COMMUNITY): Payer: 59

## 2014-12-01 LAB — BASIC METABOLIC PANEL
ANION GAP: 10 (ref 5–15)
CHLORIDE: 102 mmol/L (ref 101–111)
CO2: 26 mmol/L (ref 22–32)
CREATININE: 0.56 mg/dL (ref 0.44–1.00)
Calcium: 8.9 mg/dL (ref 8.9–10.3)
GFR calc non Af Amer: >60 mL/min (ref >60)
GLUCOSE: 131 mg/dL — AB (ref 65–99)
POTASSIUM: 3.8 mmol/L (ref 3.5–5.1)
Sodium: 138 mmol/L (ref 135–145)

## 2014-12-01 LAB — CBC
HCT: 18.8 % — ABNORMAL LOW (ref 36.0–46.0)
Hemoglobin: 6 g/dL — CL (ref 12.0–15.0)
MCH: 31.6 pg (ref 26.0–34.0)
MCHC: 31.9 g/dL (ref 30.0–36.0)
MCV: 98.9 fL (ref 78.0–100.0)
Platelets: 187 10*3/uL (ref 150–400)
RBC: 1.9 MIL/uL — ABNORMAL LOW (ref 3.87–5.11)
RDW: 18.2 % — ABNORMAL HIGH (ref 11.5–15.5)
WBC: 5.2 10*3/uL (ref 4.0–10.5)

## 2014-12-01 LAB — HEMOGLOBIN AND HEMATOCRIT, BLOOD
HCT: 26.9 % — ABNORMAL LOW (ref 36.0–46.0)
Hemoglobin: 9.2 g/dL — ABNORMAL LOW (ref 12.0–15.0)

## 2014-12-01 LAB — PREPARE RBC (CROSSMATCH)

## 2014-12-01 MED ORDER — SODIUM CHLORIDE 0.9 % IV SOLN
Freq: Once | INTRAVENOUS | Status: AC
  Administered 2014-12-01: 12:00:00 via INTRAVENOUS

## 2014-12-01 MED ORDER — DIPHENHYDRAMINE HCL 25 MG PO CAPS
25.0000 mg | ORAL_CAPSULE | Freq: Once | ORAL | Status: AC
  Administered 2014-12-01: 25 mg via ORAL
  Filled 2014-12-01: qty 1

## 2014-12-01 MED ORDER — ACETAMINOPHEN 325 MG PO TABS
650.0000 mg | ORAL_TABLET | Freq: Once | ORAL | Status: AC
  Administered 2014-12-01: 650 mg via ORAL
  Filled 2014-12-01: qty 2

## 2014-12-01 NOTE — Progress Notes (Signed)
3 Days Post-Op Procedure(s) (LRB): TOTAL HYSTERECTOMY ABDOMINAL/EXPLORATORY LAPAROTOMY (N/A) BILATERAL SALPINGO OOPHORECTOMY/OMENTECTOMY/POSSIBLE BOWEL RESECTION (Bilateral) TUMOR DEBULKING OMENTECTOMY  Late entry: patient seen and examined at 9:00 am. Subjective: Patient reports emesis x 2 yesterday. No flatus. Overall she reports feeling much better today. Better pain control.  Objective: Vital signs in last 24 hours: Temp:  [98.3 F (36.8 C)-100.5 F (38.1 C)] 98.6 F (37 C) (05/13 1413) Pulse Rate:  [94-110] 105 (05/13 1413) Resp:  [18] 18 (05/13 1413) BP: (116-135)/(68-82) 116/75 mmHg (05/13 1413) SpO2:  [99 %-100 %] 100 % (05/13 1413) Last BM Date: 11/27/14  Intake/Output from previous day: 05/12 0701 - 05/13 0700 In: 5075 [P.O.:600; I.V.:3475; IV Piggyback:1000] Out: 4200 [Urine:4200]  Physical Examination: General: alert, cooperative and no distress Resp: clear to auscultation bilaterally Cardio: tachycardic at 321, no murmurs, clicks, or rubs GI: incision: midline incision with dressing, no drainage or erythema noted and abdomen soft, faint bowel sounds, non-distended Extremities: extremities normal, atraumatic, no cyanosis or edema  Labs: WBC/Hgb/Hct/Plts:  5.2/6.0/18.8/187 (05/13 0443) BUN/Cr/glu/ALT/AST/amyl/lip:  <5/0.56/--/--/--/--/-- (05/13 0443)  Assessment: 64 y.o. s/p Procedure(s): TOTAL HYSTERECTOMY ABDOMINAL/EXPLORATORY LAPAROTOMY BILATERAL SALPINGO OOPHORECTOMY/OMENTECTOMY/POSSIBLE BOWEL RESECTION TUMOR DEBULKING OMENTECTOMY: stable Pain:  Pain is well-controlled on PRN medications.  Heme: Hgb 6 this am: which represents a reduction from yesterday. - transfuse 2 units PRBC. Will recheck H&H after transfusion.  CV: Hx HTN.  Tachycardic this am with pulse at 105 bpm.  May be secondary to anemia.  GI:  Tolerating po: sips.  Continue sips only while emesis and no flatus. Delayed return of bowel function postop.  GU: Appropraite creatinine.   FEN:  Stable post-operatively.  Prophylaxis: pharmacologic prophylaxis (with any of the following: enoxaparin (Lovenox) 40mg  SQ 2 hours prior to surgery then every day) and intermittent pneumatic compression bootsIf HH not appropriate postop, would hold lovenox.  Plan: Transfuse 2 units PRBC. Recheck H&H. CXR for fever workup.  Continue inpatient care.   LOS: 3 days    [name] 12/01/2014, 3:03 PM

## 2014-12-01 NOTE — Progress Notes (Signed)
Lab called with a critical value alert on pt. Her HGB is 6.o. Will notify MD.

## 2014-12-01 NOTE — Progress Notes (Signed)
2nd unit of prbc completed.  No reaction noted.  Vitals recorded- temp 99.9

## 2014-12-01 NOTE — Progress Notes (Signed)
I have been trying to get in touch with either Dr. Denman George and Delsa Sale every since I received the critical lab value of Hgb 6.0. I was finally able to reach her and report the lab results. No new orders at this time.

## 2014-12-02 LAB — BASIC METABOLIC PANEL
Anion gap: 10 (ref 5–15)
BUN: 5 mg/dL — AB (ref 6–20)
CALCIUM: 8.9 mg/dL (ref 8.9–10.3)
CO2: 24 mmol/L (ref 22–32)
CREATININE: 0.49 mg/dL (ref 0.44–1.00)
Chloride: 103 mmol/L (ref 101–111)
GFR calc non Af Amer: >60 mL/min (ref >60)
GLUCOSE: 114 mg/dL — AB (ref 65–99)
Potassium: 3.8 mmol/L (ref 3.5–5.1)
Sodium: 137 mmol/L (ref 135–145)

## 2014-12-02 LAB — CBC WITH DIFFERENTIAL/PLATELET
Basophils Absolute: 0 10*3/uL (ref 0.0–0.1)
Basophils Relative: 0 % (ref 0–1)
Eosinophils Absolute: 0 10*3/uL (ref 0.0–0.7)
Eosinophils Relative: 0 % (ref 0–5)
HCT: 27.6 % — ABNORMAL LOW (ref 36.0–46.0)
Hemoglobin: 9.3 g/dL — ABNORMAL LOW (ref 12.0–15.0)
LYMPHS PCT: 18 % (ref 12–46)
Lymphs Abs: 1.2 10*3/uL (ref 0.7–4.0)
MCH: 30 pg (ref 26.0–34.0)
MCHC: 33.7 g/dL (ref 30.0–36.0)
MCV: 89 fL (ref 78.0–100.0)
Monocytes Absolute: 0.9 10*3/uL (ref 0.1–1.0)
Monocytes Relative: 14 % — ABNORMAL HIGH (ref 3–12)
Neutro Abs: 4.5 10*3/uL (ref 1.7–7.7)
Neutrophils Relative %: 68 % (ref 43–77)
Platelets: 156 10*3/uL (ref 150–400)
RBC: 3.1 MIL/uL — ABNORMAL LOW (ref 3.87–5.11)
RDW: 20.2 % — ABNORMAL HIGH (ref 11.5–15.5)
WBC: 6.6 10*3/uL (ref 4.0–10.5)

## 2014-12-02 LAB — MAGNESIUM: Magnesium: 1.3 mg/dL — ABNORMAL LOW (ref 1.7–2.4)

## 2014-12-02 MED ORDER — MAGNESIUM OXIDE 400 (241.3 MG) MG PO TABS
800.0000 mg | ORAL_TABLET | Freq: Once | ORAL | Status: AC
  Administered 2014-12-02: 800 mg via ORAL
  Filled 2014-12-02: qty 2

## 2014-12-02 MED ORDER — PANTOPRAZOLE SODIUM 40 MG PO TBEC
40.0000 mg | DELAYED_RELEASE_TABLET | Freq: Every day | ORAL | Status: DC
  Administered 2014-12-03 – 2014-12-04 (×2): 40 mg via ORAL
  Filled 2014-12-02 (×3): qty 1

## 2014-12-02 NOTE — Progress Notes (Signed)
4 Days Post-Op Procedure(s) (LRB): TOTAL HYSTERECTOMY ABDOMINAL/EXPLORATORY LAPAROTOMY (N/A) BILATERAL SALPINGO OOPHORECTOMY/OMENTECTOMY/POSSIBLE BOWEL RESECTION (Bilateral) TUMOR DEBULKING OMENTECTOMY  24 hour events: Febrile at 5am 5/13 (100.5). No localizing signs of infection, negative CXR. No further fevers. Patient was transfused 2 units PRBC with appropriate rise in Hb. Continues to be tachycardic even after transfusion.  Subjective: Patient reports no emesis for more than 48 hours. No flatus. Overall she reports feeling much better today. Better pain control. Felt hungry through the night. Tolerating clears.  Objective: Vital signs in last 24 hours: Temp:  [98.3 F (36.8 C)-99.9 F (37.7 C)] 98.8 F (37.1 C) (05/14 0545) Pulse Rate:  [89-126] 112 (05/14 0545) Resp:  [16-20] 18 (05/14 0545) BP: (110-142)/(74-90) 136/84 mmHg (05/14 0545) SpO2:  [98 %-100 %] 99 % (05/14 0545) Weight:  [122 lb 9.2 oz (55.6 kg)] 122 lb 9.2 oz (55.6 kg) (05/13 1746) Last BM Date: 11/27/14  Intake/Output from previous day: 05/13 0701 - 05/14 0700 In: 3279 [I.V.:2525; Blood:754] Out: 3450 [Urine:3450]  Physical Examination: General: alert, cooperative and no distress Resp: clear to auscultation bilaterally Cardio: tachycardic at 195, no murmurs, clicks, or rubs GI: incision: midline incision with dressing, no drainage or erythema noted and abdomen soft, faint bowel sounds, non-distended Extremities: extremities normal, atraumatic, no cyanosis or edema  Labs: WBC/Hgb/Hct/Plts:  --/9.2/26.9/-- (05/13 2235)    Assessment: 64 y.o. s/p Procedure(s): TOTAL HYSTERECTOMY ABDOMINAL/EXPLORATORY LAPAROTOMY BILATERAL SALPINGO OOPHORECTOMY/OMENTECTOMY/RADICAL TUMOR DEBULKING OMENTECTOMY: stable Pain:  Pain is well-controlled on PRN medications.  Heme: Acute postop blood loss anemia - improved after transfusion 2 units on 5/13, appropriate rise indicates no ongoing losses.  CV: Hx HTN.   Tachycardic this am with pulse at 105 bpm.  Sinus Tachy on EKG. Unclear etiology. Given normal pulse ox, I believe PE less likely.   GI:  Tolerating po: sips.  Awaiting return of bowel function. Advance diet to full liquids this morning and mechanical soft tonight if she tolerates. Encouraged ambulation.  GU: Appropraite creatinine. Voiding well.    FEN: Stable post-operatively. Net positive fluid balance. Will hep lock IVF. Will check electrolytes as this may contribute to delayed bowel function.  Prophylaxis: pharmacologic prophylaxis (with any of the following: enoxaparin (Lovenox) 40mg  SQ 2 hours prior to surgery then every day) and intermittent pneumatic compression boots  Plan: Advance diet (will back off to NPO if she has emesis). Await return of bowel function (flatus) prior to discharge. Encourage ambulation. If pulse ox goes down would spin chest to rule out PE as source of tachycardia. Anticipate discharge Sunday or Monday.   LOS: 4 days    Kathleen Bond 12/02/2014, 8:24 AM

## 2014-12-02 NOTE — Progress Notes (Signed)
Pharmacy IV to PO conversion  The patient is receiving pantoprazole by the intravenous route.  Based on criteria approved by the Pharmacy and [place], the medication is being converted to the equivalent oral dose form.   No active GI bleeding or impaired absorption  Not s/p esophagectomy  Documented ability to take oral medications for > 24 hr  Plan to continue treatment for at least 1 day  If you have any questions about this conversion, please contact the Pharmacy Department (ext [phone]).  Thank you.  Reuel Boom, PharmD Pager: [phone] 12/02/2014, 4:49 PM

## 2014-12-03 LAB — CBC WITH DIFFERENTIAL/PLATELET
BASOS ABS: 0 10*3/uL (ref 0.0–0.1)
BASOS PCT: 0 % (ref 0–1)
Eosinophils Absolute: 0 10*3/uL (ref 0.0–0.7)
Eosinophils Relative: 0 % (ref 0–5)
HCT: 28.3 % — ABNORMAL LOW (ref 36.0–46.0)
HEMOGLOBIN: 9.4 g/dL — AB (ref 12.0–15.0)
LYMPHS ABS: 1.5 10*3/uL (ref 0.7–4.0)
Lymphocytes Relative: 24 % (ref 12–46)
MCH: 30.1 pg (ref 26.0–34.0)
MCHC: 33.2 g/dL (ref 30.0–36.0)
MCV: 90.7 fL (ref 78.0–100.0)
MONO ABS: 0.9 10*3/uL (ref 0.1–1.0)
MONOS PCT: 14 % — AB (ref 3–12)
NEUTROS ABS: 3.9 10*3/uL (ref 1.7–7.7)
NEUTROS PCT: 62 % (ref 43–77)
PLATELETS: 171 10*3/uL (ref 150–400)
RBC: 3.12 MIL/uL — ABNORMAL LOW (ref 3.87–5.11)
RDW: 19.5 % — ABNORMAL HIGH (ref 11.5–15.5)
WBC: 6.4 10*3/uL (ref 4.0–10.5)

## 2014-12-03 LAB — TYPE AND SCREEN
ABO/RH(D): B POS
Antibody Screen: NEGATIVE
Unit division: 0
Unit division: 0

## 2014-12-03 LAB — BASIC METABOLIC PANEL
ANION GAP: 12 (ref 5–15)
BUN: 8 mg/dL (ref 6–20)
CALCIUM: 9 mg/dL (ref 8.9–10.3)
CHLORIDE: 102 mmol/L (ref 101–111)
CO2: 24 mmol/L (ref 22–32)
CREATININE: 0.57 mg/dL (ref 0.44–1.00)
GFR calc Af Amer: >60 mL/min (ref >60)
GFR calc non Af Amer: >60 mL/min (ref >60)
Glucose, Bld: 125 mg/dL — ABNORMAL HIGH (ref 65–99)
Potassium: 3.6 mmol/L (ref 3.5–5.1)
Sodium: 138 mmol/L (ref 135–145)

## 2014-12-03 LAB — MAGNESIUM: Magnesium: 1.4 mg/dL — ABNORMAL LOW (ref 1.7–2.4)

## 2014-12-03 MED ORDER — MAGNESIUM OXIDE 400 (241.3 MG) MG PO TABS
800.0000 mg | ORAL_TABLET | Freq: Once | ORAL | Status: AC
  Administered 2014-12-03: 800 mg via ORAL
  Filled 2014-12-03: qty 2

## 2014-12-03 NOTE — Progress Notes (Signed)
5 Days Post-Op Procedure(s) (LRB): TOTAL HYSTERECTOMY ABDOMINAL/EXPLORATORY LAPAROTOMY (N/A) BILATERAL SALPINGO OOPHORECTOMY/OMENTECTOMY/POSSIBLE BOWEL RESECTION (Bilateral) TUMOR DEBULKING OMENTECTOMY  24 hour events: Afebrile for >48 hours. Passed flatus last night. Tolerating full liquids.  Subjective: Patient reports no emesis for more than 82 hours, + flatus. Overall she continues to feel much better today. Better pain control. Tolerating full liquids. Does not quite feel ready for discharge.  Objective: Vital signs in last 24 hours: Temp:  [98.8 F (37.1 C)-99.3 F (37.4 C)] 99.3 F (37.4 C) (05/15 0553) Pulse Rate:  [96-108] 98 (05/15 0553) Resp:  [16] 16 (05/15 0553) BP: (122-132)/(70-90) 127/80 mmHg (05/15 0553) SpO2:  [97 %-100 %] 97 % (05/15 0553) Weight:  [120 lb 3.2 oz (54.522 kg)] 120 lb 3.2 oz (54.522 kg) (05/14 1843) Last BM Date: 11/27/14  Intake/Output from previous day: 05/14 0701 - 05/15 0700 In: 1175.3 [P.O.:600; I.V.:575.3] Out: 1150 [Urine:1150]  Physical Examination: General: alert, cooperative and no distress Resp: clear to auscultation bilaterally Cardio: normal rate, no murmurs, clicks, or rubs GI: incision: midline incision with dressing, no drainage or erythema noted and abdomen soft, faint bowel sounds, non-distended Extremities: extremities normal, atraumatic, no cyanosis or edema  Labs: WBC/Hgb/Hct/Plts:  6.4/9.4/28.3/171 (05/15 0520) BUN/Cr/glu/ALT/AST/amyl/lip:  8/0.57/--/--/--/--/-- (05/15 0520) Mag: 1.4 (was 1.3 yesterday) Assessment: 64 y.o. s/p Procedure(s): TOTAL HYSTERECTOMY ABDOMINAL/EXPLORATORY LAPAROTOMY BILATERAL SALPINGO OOPHORECTOMY/OMENTECTOMY/RADICAL TUMOR DEBULKING OMENTECTOMY: stable Pain:  Pain is well-controlled on PRN medications.  Heme: Acute postop blood loss anemia - improved after transfusion 2 units on 5/13, appropriate rise and stable since indicates no ongoing losses.  CV: Hx HTN.  Tachycardic this  admission.  Sinus Tachy on EKG. Resolved with improvement in anemia and fluid status.  GI:  Tolerating po: full liquids. Sign of resumption of bowel function with flatus. Will advance to regular diet today.  GU: Appropriate creatinine. Voiding well.    FEN: IV saline locked. Hypomagnesemic - replace with oral mag.   Muscoloskeletal: unsteady on her feet. Will consult PT. Patient is to be discharge to home with family support and presence.  Prophylaxis: pharmacologic prophylaxis (with any of the following: enoxaparin (Lovenox) 40mg  SQ 2 hours prior to surgery then every day) and intermittent pneumatic compression boots  Plan: Advance diet to regular Mag Oxide for hypomagnesemia. PT consult for unsteadiness. Anticipate discharge tomorrow to home (will have sisters who can stay with her). Home on Lovenox for total of 28 days postop. Followup with Melissa Cross on postop day 10-14 for staple removal. Followup with me postop week 2-3 to clear for resumption of chemotherapy.   LOS: 5 days    [name] 12/03/2014, 8:59 AM

## 2014-12-04 LAB — BASIC METABOLIC PANEL
ANION GAP: 10 (ref 5–15)
BUN: 17 mg/dL (ref 6–20)
CO2: 25 mmol/L (ref 22–32)
Calcium: 9 mg/dL (ref 8.9–10.3)
Chloride: 101 mmol/L (ref 101–111)
Creatinine, Ser: 0.63 mg/dL (ref 0.44–1.00)
GFR calc Af Amer: >60 mL/min (ref >60)
GFR calc non Af Amer: >60 mL/min (ref >60)
Glucose, Bld: 109 mg/dL — ABNORMAL HIGH (ref 65–99)
Potassium: 3.9 mmol/L (ref 3.5–5.1)
Sodium: 136 mmol/L (ref 135–145)

## 2014-12-04 LAB — CBC WITH DIFFERENTIAL/PLATELET
BASOS PCT: 0 % (ref 0–1)
Basophils Absolute: 0 10*3/uL (ref 0.0–0.1)
EOS ABS: 0 10*3/uL (ref 0.0–0.7)
Eosinophils Relative: 0 % (ref 0–5)
HEMATOCRIT: 30.3 % — AB (ref 36.0–46.0)
Hemoglobin: 9.9 g/dL — ABNORMAL LOW (ref 12.0–15.0)
Lymphocytes Relative: 30 % (ref 12–46)
Lymphs Abs: 1.7 10*3/uL (ref 0.7–4.0)
MCH: 30.2 pg (ref 26.0–34.0)
MCHC: 32.7 g/dL (ref 30.0–36.0)
MCV: 92.4 fL (ref 78.0–100.0)
MONO ABS: 0.8 10*3/uL (ref 0.1–1.0)
Monocytes Relative: 15 % — ABNORMAL HIGH (ref 3–12)
NEUTROS ABS: 3.1 10*3/uL (ref 1.7–7.7)
Neutrophils Relative %: 55 % (ref 43–77)
Platelets: 207 10*3/uL (ref 150–400)
RBC: 3.28 MIL/uL — ABNORMAL LOW (ref 3.87–5.11)
RDW: 18.7 % — ABNORMAL HIGH (ref 11.5–15.5)
WBC: 5.6 10*3/uL (ref 4.0–10.5)

## 2014-12-04 LAB — MAGNESIUM: MAGNESIUM: 1.6 mg/dL — AB (ref 1.7–2.4)

## 2014-12-04 MED ORDER — ENOXAPARIN SODIUM 40 MG/0.4ML ~~LOC~~ SOLN: 40.0000 mg | SUBCUTANEOUS | Status: DC

## 2014-12-04 MED ORDER — TRAMADOL HCL 50 MG PO TABS: 50.0000 mg | ORAL_TABLET | Freq: Four times a day (QID) | ORAL | Status: DC | PRN

## 2014-12-04 NOTE — Discharge Instructions (Signed)
12/04/2014  Return to work: 6 weeks if applicable  Activity: 1. Be up and out of the bed during the day.  Take a nap if needed.  You may walk up steps but be careful and use the hand rail.  Stair climbing will tire you more than you think, you may need to stop part way and rest.   2. No lifting or straining for 6 weeks.  3. No driving for 2 week(s) from surgery.  Do not drive if you are taking narcotic pain medicine.  4. Shower daily.  Use soap and water on your incision and pat dry; don't rub.  No tub baths until cleared by your surgeon.   5. No sexual activity and nothing in the vagina for 6 weeks.  Diet: 1. Low sodium Heart Healthy Diet is recommended.  2. It is safe to use a laxative, such as Miralax or Colace, if you have difficulty moving your bowels.   Wound Care: 1. Keep clean and dry.  Shower daily.  Reasons to call the Doctor:  Fever - Oral temperature greater than 100.4 degrees Fahrenheit  Foul-smelling vaginal discharge  Difficulty urinating  Nausea and vomiting  Increased pain at the site of the incision that is unrelieved with pain medicine.  Difficulty breathing with or without chest pain  New calf pain especially if only on one side  Sudden, continuing increased vaginal bleeding with or without clots.   Contacts: For questions or concerns you should contact:   Dr. Everitt Amber at [phone]  Joylene John, NP at [phone]  After Hours: call [phone] and ask for the GYN Oncologist on call

## 2014-12-04 NOTE — Progress Notes (Signed)
Patient alert and oriented x4, vital signs stable. Staples removed incision intact with edges approximated and steri strips placed. No drainage noted at time of removal.  Patient tolerated procedure well. IV removed from patient, catheter intact and procedure tolerated well.  Patient agreeable to receiving discharge instructions. Discharge instructions reviewed in detail. All questions and concerns answered. Patient given prescription for tramadol to call in to pharmacy of choice. Janifer Adie 12/04/2014 11:35 AM

## 2014-12-04 NOTE — Discharge Summary (Signed)
Physician Discharge Summary  Patient ID: Kathleen Bond MRN: [id number] DOB/AGE: 12/23/1950 64 y.o.  Admit date: 11/28/2014 Discharge date: 12/04/2014  Admission Diagnoses: Ovarian cancer  Discharge Diagnoses:  Principal Problem:   Ovarian cancer   Discharged Condition:  The patient is in good condition and stable for discharge.    Hospital Course: On 11/28/2014, the patient underwent the following: Procedure(s): TOTAL HYSTERECTOMY ABDOMINAL/EXPLORATORY LAPAROTOMY BILATERAL SALPINGO OOPHORECTOMY/OMENTECTOMY/POSSIBLE BOWEL RESECTION TUMOR DEBULKING OMENTECTOMY.  The postoperative course included acute postop blood loss anemia with improvement after transfusion 2 units on 5/13, appropriate rise and stable since indicates no ongoing losses along with the development of fevers on POD 2.  A chest xray was ordered as a workup resulting: Improvement in bibasilar atelectasis and effusions with persistent left effusion.  Her fevers resolved along with sinus tachycardia after a blood transfusion and fluids.  She was discharged to home on postoperative day 6 tolerating a regular diet and passing flatus with minimal pain.  She is to take lovenox 40 mg SQ daily for a total of 28 days post-operatively.    Consults: None  Significant Diagnostic Studies: None  Treatments: surgery: see above  Discharge Exam: Blood pressure 103/59, pulse 93, temperature 99.2 F (37.3 C), temperature source Oral, resp. rate 16, height 5\' 2"  (1.575 m), weight 119 lb 6.4 oz (54.159 kg), SpO2 97 %. General appearance: alert, cooperative and no distress Resp: clear to auscultation bilaterally Cardio: regular rate and rhythm, S1, S2 normal, no murmur, click, rub or gallop GI: soft, non-tender; bowel sounds normal; no masses,  no organomegaly Extremities: extremities normal, atraumatic, no cyanosis or edema Incision/Wound: Midline incision with staples, dressing removed, RN to remove staples, no erythema or  drainage  Disposition: 01-Home or Self Care      Discharge Instructions    Call MD for:  difficulty breathing, headache or visual disturbances    Complete by:  As directed      Call MD for:  extreme fatigue    Complete by:  As directed      Call MD for:  hives    Complete by:  As directed      Call MD for:  persistant dizziness or light-headedness    Complete by:  As directed      Call MD for:  persistant nausea and vomiting    Complete by:  As directed      Call MD for:  redness, tenderness, or signs of infection (pain, swelling, redness, odor or green/yellow discharge around incision site)    Complete by:  As directed      Call MD for:  severe uncontrolled pain    Complete by:  As directed      Call MD for:  temperature >100.4    Complete by:  As directed      Diet - low sodium heart healthy    Complete by:  As directed      Driving Restrictions    Complete by:  As directed   No driving for 2 weeks from surgery.  Do not take narcotics and drive.     Increase activity slowly    Complete by:  As directed      Lifting restrictions    Complete by:  As directed   No lifting greater than 10 lbs.     Sexual Activity Restrictions    Complete by:  As directed   No sexual activity, nothing in the vagina, for 6 weeks.  Medication List    STOP taking these medications        dexamethasone 4 MG tablet  Commonly known as:  DECADRON      TAKE these medications        docusate sodium 100 MG capsule  Commonly known as:  COLACE  Take 100 mg by mouth daily as needed for mild constipation.     enalapril 10 MG tablet  Commonly known as:  VASOTEC  Take 10 mg by mouth every morning.     enoxaparin 40 MG/0.4ML injection  Commonly known as:  LOVENOX  Inject 0.4 mLs (40 mg total) into the skin daily.     ferrous fumarate 325 (106 FE) MG Tabs tablet  Commonly known as:  HEMOCYTE  Take one tablet by mouth daily on an empty stomach with orange juice.     furosemide 20  MG tablet  Commonly known as:  LASIX  Take 20 mg by mouth at bedtime.     levothyroxine 150 MCG tablet  Commonly known as:  SYNTHROID, LEVOTHROID  Take 150 mcg by mouth daily before breakfast.     lidocaine-prilocaine cream  Commonly known as:  EMLA  Apply 1 application topically as needed (to PAC prior to access).     LORazepam 0.5 MG tablet  Commonly known as:  ATIVAN  Take 0.5 mg by mouth every 6 (six) hours as needed (nausea/vomitting.).     ondansetron 8 MG tablet  Commonly known as:  ZOFRAN  Take 1 tablet (8 mg total) by mouth every 8 (eight) hours as needed for nausea or vomiting.     pantoprazole 40 MG tablet  Commonly known as:  PROTONIX  Take 1 tablet (40 mg total) by mouth daily.     prochlorperazine 10 MG tablet  Commonly known as:  COMPAZINE  Take 1 tablet (10 mg total) by mouth every 6 (six) hours as needed for nausea or vomiting.     sucralfate 1 GM/10ML suspension  Commonly known as:  CARAFATE  Take 10 mLs (1 g total) by mouth 4 (four) times daily -  with meals and at bedtime.     traMADol 50 MG tablet  Commonly known as:  ULTRAM  Take 1-2 tablets (50-100 mg total) by mouth every 6 (six) hours as needed (pain).     Vitamin D (Ergocalciferol) 50000 UNITS Caps capsule  Commonly known as:  DRISDOL  Take 50,000 Units by mouth every 7 (seven) days.       Follow-up Information    Follow up with [name], MD On 12/13/2014.   Specialty:  Obstetrics and Gynecology   Why:  at 11:00am at the [facility] for postop check and clearance to start chemotherapy   Contact information:   [place] [id number] [phone]       Follow up with LIVESAY,LENNIS P, MD In 2 weeks.   Specialty:  Oncology   Why:  to restart chemotherapy   Contact information:   [place] [address] [phone]       Greater than thirty minutes were spend for face to face discharge instructions and discharge orders/summary in EPIC.    Signed: CROSS, MELISSA DEAL 12/04/2014, 1:22 PM

## 2014-12-06 ENCOUNTER — Other Ambulatory Visit: Payer: Self-pay | Admitting: Oncology

## 2014-12-06 ENCOUNTER — Telehealth: Payer: Self-pay | Admitting: Oncology

## 2014-12-06 ENCOUNTER — Telehealth: Payer: Self-pay | Admitting: *Deleted

## 2014-12-06 NOTE — Telephone Encounter (Signed)
TC from [facility] at [facility] wanting to verify hospitalization for January 2016 and that pt would not have been able to work. Verification given

## 2014-12-06 NOTE — Telephone Encounter (Signed)
Called and spoke with patient and she is aware of her new appointments

## 2014-12-13 ENCOUNTER — Other Ambulatory Visit: Payer: Self-pay | Admitting: Oncology

## 2014-12-13 ENCOUNTER — Encounter: Payer: Self-pay | Admitting: Gynecologic Oncology

## 2014-12-13 ENCOUNTER — Ambulatory Visit: Payer: 59 | Attending: Gynecologic Oncology | Admitting: Gynecologic Oncology

## 2014-12-13 VITALS — BP 118/73 | HR 82 | Temp 98.4°F | Resp 18 | Ht 62.0 in | Wt 117.6 lb

## 2014-12-13 DIAGNOSIS — I10 Essential (primary) hypertension: Secondary | ICD-10-CM | POA: Diagnosis not present

## 2014-12-13 DIAGNOSIS — D649 Anemia, unspecified: Secondary | ICD-10-CM | POA: Diagnosis not present

## 2014-12-13 DIAGNOSIS — Z48816 Encounter for surgical aftercare following surgery on the genitourinary system: Secondary | ICD-10-CM | POA: Insufficient documentation

## 2014-12-13 DIAGNOSIS — E039 Hypothyroidism, unspecified: Secondary | ICD-10-CM | POA: Diagnosis not present

## 2014-12-13 DIAGNOSIS — Z90710 Acquired absence of both cervix and uterus: Secondary | ICD-10-CM | POA: Insufficient documentation

## 2014-12-13 DIAGNOSIS — Z7901 Long term (current) use of anticoagulants: Secondary | ICD-10-CM | POA: Diagnosis not present

## 2014-12-13 DIAGNOSIS — Z853 Personal history of malignant neoplasm of breast: Secondary | ICD-10-CM | POA: Diagnosis not present

## 2014-12-13 DIAGNOSIS — Z90722 Acquired absence of ovaries, bilateral: Secondary | ICD-10-CM | POA: Diagnosis not present

## 2014-12-13 DIAGNOSIS — Z79899 Other long term (current) drug therapy: Secondary | ICD-10-CM | POA: Insufficient documentation

## 2014-12-13 DIAGNOSIS — C569 Malignant neoplasm of unspecified ovary: Secondary | ICD-10-CM | POA: Diagnosis present

## 2014-12-13 DIAGNOSIS — Z7189 Other specified counseling: Secondary | ICD-10-CM

## 2014-12-13 NOTE — Patient Instructions (Signed)
Plan for an additional three more cycles then see Dr. Denman George after that time.  Please call for any questions or concerns.

## 2014-12-13 NOTE — Progress Notes (Signed)
OVARIAN CANCER FOLLOWUP VISIT Assessment:    64 y.o. 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   S/p exploratory laparotomy TAH, BSO, omentectomy and radical tumor debulking on 11/28/14. Doing well from a postoperative standpoint.  Plan: 1) Continue adjuvant chemotherapy with 3 additional cycles of paclitaxel and carboplatin. 2) CT imaging and CA 125 assessment after 3rd cycle (accumulative cycle #8) 3) I will see Malu back after she has completed adjuvant therapy.  HPI:  Kathleen Bond is a 64 y.o. year old G29 initially seen in consultation on 07/25/14 for stage IIIC ovarian cancer.  She then underwent an exploratory laparotomy and omental biopsy on 08/15/14. It was complicated by inability to complete a debulking procedure due to extensive peritoneal involvement of small and large intestine and complete peritoneal involvement with tumor.  Her postoperative course was uncomplicated and she was started on cycle 1 of paclitaxel and carboplatin on 08/18/14.  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  She went on to receive 5 cycles of neoadjuvant chemotherapy with paclitaxel and carboplatin. CA 125 assessments with each cycle have shown considerable positive response (from 1162 upon commencement, to 76 on 10/05/14 after cycle 3).  CT of the chest, abdomen and pelvis on 10/16/14 revealed good response to therapy with significant decrease in pelvic mass size (5cm), resolution of ascites and lymphadenopathy, persistent but signifcantly reduced omental cake. The CT did show a left breast increased density. She feels it is the same from post-treatment for breast cancer. She is having a followup mammogram in 1 week.  On 11/28/14 she underwent ex lap, TAH, BSO, omentectomy and radical tumor debulking. An R1 resection was performed with residual disease comprising millial studding (70m implants) on serosal small  intestinal surface.   Interval history: Postoperatively she is doing well, tolerating a regular diet, improving energy levels, no pain. She has had a small amount of drainage from the superior aspect of her incision. No fevers or chills.   Review of systems: Constitutional:  She has no weight gain or weight loss. She has no fever or chills. Eyes: No blurred vision Ears, Nose, Mouth, Throat: No dizziness, headaches or changes in hearing. No mouth sores. Cardiovascular: No chest pain, palpitations or edema. Respiratory:  No shortness of breath, wheezing or cough Gastrointestinal: She has normal bowel movements without diarrhea or constipation. She denies any nausea or vomiting. She denies blood in her stool or heart burn. Genitourinary:  She denies pelvic pain, pelvic pressure or changes in her urinary function. She has no hematuria, dysuria, or incontinence. She has no irregular vaginal bleeding or vaginal discharge Musculoskeletal: Denies muscle weakness or joint pains.  Skin:  She has no skin changes, rashes or itching Neurological:  Denies dizziness or headaches. No neuropathy, no numbness or tingling. Psychiatric:  She denies depression or anxiety. Hematologic/Lymphatic:   No easy bruising or bleeding  No Known Allergies  Current Outpatient Prescriptions on File Prior to Visit  Medication Sig Dispense Refill  . enalapril (VASOTEC) 10 MG tablet Take 10 mg by mouth every morning.    . enoxaparin (LOVENOX) 40 MG/0.4ML injection Inject 0.4 mLs (40 mg total) into the skin daily. 22 Syringe 0  . ferrous fumarate (HEMOCYTE) 325 (106 FE) MG TABS tablet Take one tablet by mouth daily on an empty stomach with orange juice. 30 each 4  . levothyroxine (SYNTHROID, LEVOTHROID) 150 MCG tablet Take 150 mcg by mouth daily before breakfast.    .  lidocaine-prilocaine (EMLA) cream Apply 1 application topically as needed (to PAC prior to access).    . LORazepam (ATIVAN) 0.5 MG tablet Take 0.5 mg by mouth  every 6 (six) hours as needed (nausea/vomitting.).     Marland Kitchen traMADol (ULTRAM) 50 MG tablet Take 1-2 tablets (50-100 mg total) by mouth every 6 (six) hours as needed (pain). 30 tablet 0  . Vitamin D, Ergocalciferol, (DRISDOL) 50000 UNITS CAPS capsule Take 50,000 Units by mouth every 7 (seven) days.    Marland Kitchen docusate sodium (COLACE) 100 MG capsule Take 100 mg by mouth daily as needed for mild constipation.    . furosemide (LASIX) 20 MG tablet Take 20 mg by mouth at bedtime.    . ondansetron (ZOFRAN) 8 MG tablet Take 1 tablet (8 mg total) by mouth every 8 (eight) hours as needed for nausea or vomiting. (Patient not taking: Reported on 11/23/2014) 20 tablet 1  . pantoprazole (PROTONIX) 40 MG tablet Take 1 tablet (40 mg total) by mouth daily. (Patient not taking: Reported on 11/09/2014) 30 tablet 2  . prochlorperazine (COMPAZINE) 10 MG tablet Take 1 tablet (10 mg total) by mouth every 6 (six) hours as needed for nausea or vomiting. (Patient not taking: Reported on 11/23/2014) 30 tablet 0  . sucralfate (CARAFATE) 1 GM/10ML suspension Take 10 mLs (1 g total) by mouth 4 (four) times daily -  with meals and at bedtime. (Patient not taking: Reported on 11/09/2014) 420 mL 0   No current facility-administered medications on file prior to visit.    Past Medical History  Diagnosis Date  . Hypertension   . Hypothyroidism   . Breast cancer 2004    left breast lumpectomy  . Ovarian ca dx'd 07/2014  . BRCA2 positive     BRCA2 [id number]  . Anemia     Past Surgical History  Procedure Laterality Date  . Left breast lumpectomy  2004  . Parathyroidectomy  2005  . Tubal ligation Bilateral   . Laparotomy N/A 08/15/2014    Procedure: EXPLORATORY LAPAROTOMY;  Surgeon: Everitt Amber, MD;  Location: [facility];  Service: Gynecology;  Laterality: N/A;  . Omentectomy N/A 08/15/2014    Procedure: biopsy of omentum;  Surgeon: Everitt Amber, MD;  Location: [facility];  Service: Gynecology;  Laterality: N/A;  . Abdominal hysterectomy N/A 11/28/2014     Procedure: TOTAL HYSTERECTOMY ABDOMINAL/EXPLORATORY LAPAROTOMY;  Surgeon: Everitt Amber, MD;  Location: [facility];  Service: Gynecology;  Laterality: N/A;  . Salpingoophorectomy Bilateral 11/28/2014    Procedure: BILATERAL SALPINGO OOPHORECTOMY/OMENTECTOMY/POSSIBLE BOWEL RESECTION;  Surgeon: Everitt Amber, MD;  Location: [facility];  Service: Gynecology;  Laterality: Bilateral;  . Debulking  11/28/2014    Procedure: TUMOR DEBULKING;  Surgeon: Everitt Amber, MD;  Location: [facility];  Service: Gynecology;;  . Sharlene Motts  11/28/2014    Procedure: OMENTECTOMY;  Surgeon: Everitt Amber, MD;  Location: [facility];  Service: Gynecology;;    Family History  Problem Relation Age of Onset  . Hypertension Mother   . Asthma Father   . Bladder Cancer Brother 45    deceased; smoker    History   Social History  . Marital Status: Single    Spouse Name: N/A  . Number of Children: N/A  . Years of Education: N/A   Occupational History  . Not on file.   Social History Main Topics  . Smoking status: Never Smoker   . Smokeless tobacco: Never Used  . Alcohol Use: Yes     Comment: occasional glass of wine   .  Drug Use: No  . Sexual Activity: Not on file   Other Topics Concern  . Not on file   Social History Narrative     Physical Exam: Blood pressure 118/73, pulse 82, temperature 98.4 F (36.9 C), resp. rate 18, height 5' 2"  (1.575 m), weight 117 lb 9.6 oz (53.343 kg). General: Well dressed, well nourished in no apparent distress.   HEENT:  Normocephalic and atraumatic, no lesions.  Extraocular muscles intact. Sclerae anicteric. Pupils equal, round, reactive. No mouth sores or ulcers. Thyroid is normal size, not nodular, midline. Skin:  No lesions or rashes. Breasts:  Firmness left breast consistent with CT findings. Not well circumscribed. Lungs:  Clear to auscultation bilaterally.  No wheezes. Cardiovascular:  Regular rate and rhythm.  No murmurs or rubs. Abdomen:  Soft, nontender, nondistended.  No palpable  masses.  No hepatosplenomegaly.  No ascites. Normal bowel sounds.  No hernias.  Incision is mostly healed. THere is a 1-2cm area at the superior third of the incision which is slightly indurated but with no fluctuance. Genitourinary: Normal EGBUS  Vaginal cuff intact.  No bleeding or discharge.  No cul de sac fullness. Rectal exam reveals free with no nodularity.  Extremities: No cyanosis, clubbing or edema.  No calf tenderness or erythema. No palpable cords. Psychiatric: Mood and affect are appropriate. Neurological: Awake, alert and oriented x 3. Sensation is intact, no neuropathy.  Musculoskeletal: No pain, normal strength and range of motion.   [name], MD

## 2014-12-15 ENCOUNTER — Other Ambulatory Visit (HOSPITAL_BASED_OUTPATIENT_CLINIC_OR_DEPARTMENT_OTHER): Payer: 59

## 2014-12-15 ENCOUNTER — Telehealth: Payer: Self-pay | Admitting: Oncology

## 2014-12-15 ENCOUNTER — Encounter: Payer: Self-pay | Admitting: Oncology

## 2014-12-15 ENCOUNTER — Ambulatory Visit (HOSPITAL_BASED_OUTPATIENT_CLINIC_OR_DEPARTMENT_OTHER): Payer: 59 | Admitting: Oncology

## 2014-12-15 VITALS — BP 117/96 | HR 66 | Temp 98.4°F | Resp 18 | Ht 62.0 in | Wt 118.1 lb

## 2014-12-15 DIAGNOSIS — Z95828 Presence of other vascular implants and grafts: Secondary | ICD-10-CM

## 2014-12-15 DIAGNOSIS — D6481 Anemia due to antineoplastic chemotherapy: Secondary | ICD-10-CM | POA: Diagnosis not present

## 2014-12-15 DIAGNOSIS — D72819 Decreased white blood cell count, unspecified: Secondary | ICD-10-CM | POA: Diagnosis not present

## 2014-12-15 DIAGNOSIS — C50912 Malignant neoplasm of unspecified site of left female breast: Secondary | ICD-10-CM

## 2014-12-15 DIAGNOSIS — Z1501 Genetic susceptibility to malignant neoplasm of breast: Secondary | ICD-10-CM

## 2014-12-15 DIAGNOSIS — Z1509 Genetic susceptibility to other malignant neoplasm: Principal | ICD-10-CM

## 2014-12-15 DIAGNOSIS — C561 Malignant neoplasm of right ovary: Secondary | ICD-10-CM

## 2014-12-15 DIAGNOSIS — T451X5A Adverse effect of antineoplastic and immunosuppressive drugs, initial encounter: Secondary | ICD-10-CM

## 2014-12-15 DIAGNOSIS — C569 Malignant neoplasm of unspecified ovary: Secondary | ICD-10-CM

## 2014-12-15 DIAGNOSIS — D701 Agranulocytosis secondary to cancer chemotherapy: Secondary | ICD-10-CM

## 2014-12-15 DIAGNOSIS — C562 Malignant neoplasm of left ovary: Secondary | ICD-10-CM | POA: Diagnosis not present

## 2014-12-15 DIAGNOSIS — R634 Abnormal weight loss: Secondary | ICD-10-CM

## 2014-12-15 LAB — CBC WITH DIFFERENTIAL/PLATELET
BASO%: 0.2 % (ref 0.0–2.0)
Basophils Absolute: 0 10*3/uL (ref 0.0–0.1)
EOS%: 1.2 % (ref 0.0–7.0)
Eosinophils Absolute: 0.1 10*3/uL (ref 0.0–0.5)
HCT: 32.2 % — ABNORMAL LOW (ref 34.8–46.6)
HGB: 10.3 g/dL — ABNORMAL LOW (ref 11.6–15.9)
LYMPH%: 22.4 % (ref 14.0–49.7)
MCH: 30.4 pg (ref 25.1–34.0)
MCHC: 32 g/dL (ref 31.5–36.0)
MCV: 95 fL (ref 79.5–101.0)
MONO#: 0.6 10*3/uL (ref 0.1–0.9)
MONO%: 9.4 % (ref 0.0–14.0)
NEUT#: 4 10*3/uL (ref 1.5–6.5)
NEUT%: 66.8 % (ref 38.4–76.8)
PLATELETS: 391 10*3/uL (ref 145–400)
RBC: 3.39 10*6/uL — ABNORMAL LOW (ref 3.70–5.45)
RDW: 16 % — AB (ref 11.2–14.5)
WBC: 5.9 10*3/uL (ref 3.9–10.3)
lymph#: 1.3 10*3/uL (ref 0.9–3.3)

## 2014-12-15 LAB — COMPREHENSIVE METABOLIC PANEL (CC13)
ALK PHOS: 90 U/L (ref 40–150)
ALT: 13 U/L (ref 0–55)
ANION GAP: 11 meq/L (ref 3–11)
AST: 12 U/L (ref 5–34)
Albumin: 3.6 g/dL (ref 3.5–5.0)
BILIRUBIN TOTAL: 0.26 mg/dL (ref 0.20–1.20)
BUN: 10 mg/dL (ref 7.0–26.0)
CALCIUM: 9.2 mg/dL (ref 8.4–10.4)
CHLORIDE: 102 meq/L (ref 98–109)
CO2: 28 mEq/L (ref 22–29)
Creatinine: 0.7 mg/dL (ref 0.6–1.1)
EGFR: >90 mL/min/{1.73_m2} (ref >90)
Glucose: 92 mg/dl (ref 70–140)
Potassium: 4 mEq/L (ref 3.5–5.1)
Sodium: 141 mEq/L (ref 136–145)
Total Protein: 7.5 g/dL (ref 6.4–8.3)

## 2014-12-15 NOTE — Progress Notes (Signed)
I placed disab form on desk of nurse for dr Marko Plume

## 2014-12-15 NOTE — Telephone Encounter (Signed)
Spoke with Kathleen Bond as the patient has not gotten home yet and she will give her the new appointment time for 6/2

## 2014-12-15 NOTE — Progress Notes (Signed)
OFFICE PROGRESS NOTE   Dec 15, 2014   Physicians:K.Karb, M.Manning), E.Rossi, Cleda Mccreedy (PCP), [name]  INTERVAL HISTORY:  Patient is seen, alone for visit, now having had interval R1 debulking by Dr Denman George on 11-28-14 of high grade papillary serous carcinoma,  apparent left ovarian primary. She had neoadjuvant carbo taxol x 5 cycles from 08-18-14 thru 11-09-14 with gCSF support; plan is to continue another 3 cycles of same chemotherapy now.   Surgery was exploratory laparotomy with TAH/ BSO, omentectomy and radical tumor debulking; R1 resection with residual milial tumor on small bowel at completion of procedure. Tumor involved bilateral adnexa, uterine serosa, omentum and falciform ligament, with + LVSI, bilateral ovaries densely adherent to rectosigmoid, no nodes examined. Maximum tumor size was 4.3 cm involving left ovary. She had 2 units of PRBCs on 12-01-14 and some fever on POD #2 which resolved. She was DC home on 12-04-14 on 4 weeks of prophylactic lovenox.  Patient has been gradually improving since discharge, tho po intake not optimal and activity is still limited at home. She has some "pulling" discomfort upper area of ventral incision unchanged from earlier this week when seen by Dr Denman George, no drainage in last 1-2 days. She has not needed any pain medication in last 36 hrs. No fever. Appetite better just in last couple of days, tho does not tolerate full portions; she is not using any supplements but is willing to try [employer].  Bowels moving ~ every other day, not using stool softener but likely will need this when resumes chemo. No SOB. Bladder ok. No bleeding.  No specific comments in operative note about apparent ventral hernia which developed prior to this surgery  PAC in Flu vaccine done genetics testing with [employer] panel 10-12-14 has mutation in BRCA 2 (p.[id number]*)   ONCOLOGIC HISTORY  Left ovarian cancer: Patient became symptomatic with abdominal swelling and  constipation in late Nov 2015, seen and treated initially for what was thought to be ulcer. Symptoms continued, with early satiety causing poor po intake; she was seen again at [facility] ED in early Jan 2016, with CT reportedly consistent with stage IIIC or IV gyn cancer, including omental caking, large pelvic mass, para-aortic adenopathy, ascites, pleural effusions and right subpleural nodule. She was seen in consultation by Dr Denman George on 07-25-14, with concern from her exam and scan for rectal involvement. 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 She had paracentesis of 3.4 liters ascites on 07-27-2014, with cytology ([id number]) showing serous carcinoma consistent with gyn primary. She had right thoracentesis for 350 cc on 08-11-14, this requested by anesthesia prior to surgery and apparently not sent for cytology. Patient was more comfortable after paracentesis, but could not tell any difference in breathing after thoracentesis (denies SOB prior to that procedure). CA 125 on 08-02-14 was 1162. She was taken to exploratory laparotomy by Dr Denman George on 08-15-14, with 4 liters of ascites removed and biopsy of omentum obtained, with additional debulking not attempted due to surgical findings of entire peritoneal surface replaced by tumor plaque, small intestine coated with tumor and mesentery tethered with tumor nodules, ileum adherent to pelvic mass, dense plaque of tumor on right diaphragm and 10 cm omental cake in LUQ. Pathology of omental biopsy She needed another paracentesis by IR for 1.3 liters ascites on 08-17-14, and had PAC placed by IR also 08-17-14. Day 1 cycle 1 dose dense carboplatin taxol was given in hospital on 08-18-14. After 2  cycles of dose dense regimen, the carboplatin and taxol was changed to every 3 week regimen beginning cycle 3; she had cycle 5 on 11-09-14, with gCSF support. She had interval debulking by Dr Denman George on 11-28-14. Surgery was  exploratory laparotomy with TAH/ BSO, omentectomy and radical tumor debulking; R1 resection with residual milial tumor on small bowel at completion of procedure. Tumor involved bilateral adnexa, uterine serosa, omentum and falciform ligament, with + LVSI, no nodes examined. Maximum tumor size was 4.3 cm involving left ovary.    BRCA 2 abnormality identified 10-12-14.  Breast cancer: Left breast biopsy 11-22-2003 ([id number]) invasive carcinoma. Left breast wide excision with sentinel nodes and axillary contents by Dr Benson Norway 01-03-2004 [phone]) poorly differentiated invasive ductal carcinoma 3.0 cm with closest margin 1.7 cm deep, 6 sentinel nodes + 3 additional nodes all negative, ER 0, PR 0, HER 2 3+, Ki67 of 19%. . She received adjuvant chemotherapy by Dr Sonny Dandy with 5FU, epirubicin, cytoxan x 4 cycles from 02-13-2004 thru 04-16-2004, followed by taxol 80 mg/m2 weekly x 11 from 05-07-2004 thru 09-04-2004, and herceptin 05-21-2004 thru 02-25-2005. Antiemetics were Kytril and decadron. She had PAC for the chemotherapy. She had radiation by Dr Tyler Pita in [place]. Last note from Dr Sonny Dandy in this information was 04-2008. Left diagnostic mammogram [place] 10-25-2014 stable compared with 12-06-13 and priors; due bilateral mammograms again late May 2016.   Review of systems as above, also: No other pain. Is able to sleep. No bleeding. No LE swelling. No problems with PAC, not used in hospital. No significant peripheral neuropathy Remainder of 10 point Review of Systems negative.  Objective:  Vital signs in last 24 hours:  BP 117/96 mmHg  Pulse 66  Temp(Src) 98.4 F (36.9 C) (Oral)  Resp 18  Ht 5' 2"  (1.575 m)  Wt 118 lb 1.6 oz (53.57 kg)  BMI 21.60 kg/m2 Weight down 5 lbs from prior to surgery. Looks tired but not in acute discomfort, very pleasant as always. Alert, oriented and appropriate. Ambulatory without assistance .  Alopecia  HEENT:PERRL, sclerae not icteric. Oral mucosa moist without  lesions, posterior pharynx clear.  Neck supple. No JVD.  Lymphatics:no cervical,supraclavicular adenopathy Resp: clear to auscultation bilaterally and normal percussion bilaterally Cardio: regular rate and rhythm. No gallop. GI: soft, not distended, no mass or organomegaly. Normally active bowel sounds. Surgical incision with faint erythema superiorly over 3-4 cm, with 2 areas slightly firm, not fluctuant, no drainage.  Musculoskeletal/ Extremities: without pitting edema, cords, tenderness Neuro: no significant peripheral neuropathy. Otherwise nonfocal. Psych appropriate mood and affect Skin without ecchymosis, petechiae. Slightly erythematous acneiform rash central face Portacath-without erythema or tenderness   Following exam by this MD, gyn onc NP also evaluated the incision, as she had seen area at Dr Serita Grit visit on 12-13-14: no change noted, nothing expressed.   Lab Results:  Results for orders placed or performed in visit on 12/15/14  CBC with Differential  Result Value Ref Range   WBC 5.9 3.9 - 10.3 10e3/uL   NEUT# 4.0 1.5 - 6.5 10e3/uL   HGB 10.3 (L) 11.6 - 15.9 g/dL   HCT 32.2 (L) 34.8 - 46.6 %   Platelets 391 145 - 400 10e3/uL   MCV 95.0 79.5 - 101.0 fL   MCH 30.4 25.1 - 34.0 pg   MCHC 32.0 31.5 - 36.0 g/dL   RBC 3.39 (L) 3.70 - 5.45 10e6/uL   RDW 16.0 (H) 11.2 - 14.5 %   lymph# 1.3 0.9 - 3.3 10e3/uL  MONO# 0.6 0.1 - 0.9 10e3/uL   Eosinophils Absolute 0.1 0.0 - 0.5 10e3/uL   Basophils Absolute 0.0 0.0 - 0.1 10e3/uL   NEUT% 66.8 38.4 - 76.8 %   LYMPH% 22.4 14.0 - 49.7 %   MONO% 9.4 0.0 - 14.0 %   EOS% 1.2 0.0 - 7.0 %   BASO% 0.2 0.0 - 2.0 %  Comprehensive metabolic panel (Cmet) - [facility]  Result Value Ref Range   Sodium 141 136 - 145 mEq/L   Potassium 4.0 3.5 - 5.1 mEq/L   Chloride 102 98 - 109 mEq/L   CO2 28 22 - 29 mEq/L   Glucose 92 70 - 140 mg/dl   BUN 10.0 7.0 - 26.0 mg/dL   Creatinine 0.7 0.6 - 1.1 mg/dL   Total Bilirubin 0.26 0.20 - 1.20 mg/dL   Alkaline  Phosphatase 90 40 - 150 U/L   AST 12 5 - 34 U/L   ALT 13 0 - 55 U/L   Total Protein 7.5 6.4 - 8.3 g/dL   Albumin 3.6 3.5 - 5.0 g/dL   Calcium 9.2 8.4 - 10.4 mg/dL   Anion Gap 11 3 - 11 mEq/L   EGFR >90 >90 ml/min/1.73 m2     Studies/Results:  [id number] Received: 11/29/2014 Everitt Amber, MDATHOLOGY FINAL DIAGNOSIS Diagnosis 1. Uterus +/- tubes/ovaries, neoplastic, with cervix - HIGH GRADE PAPILLARY SEROUS CARCINOMA, INVOLVING BILATERAL ADNEXA AND UTERINE SEROSA. - LYMPHOVASCULAR INVASION IS IDENTIFIED. - SEE ONCOLOGY TABLE BELOW. 2. Ligament, falciform - METASTATIC HIGH GRADE PAPILLARY SEROUS CARCINOMA. 3. Omentum, resection for tumor - METASTATIC HIGH GRADE PAPILLARY SEROUS CARCINOMA. 4. Soft tissue, biopsy, anterior abdominal wall nodule - BENIGN FIBROADIPOSE TISSUE WITH DENSE STROMAL FIBROSIS AND INFLAMMATION, INCLUDING FOREIGN BODY GIANT CELL REACTION. - THERE IS NO EVIDENCE OF MALIGNANCY.  Discussed pathology findings with patient.  Medications: I have reviewed the patient's current medications. Explained rationale for lovenox.  DISCUSSION: With Memorial Day holiday, next available chemotherapy appointment is 12-21-14. In interim I have encouraged her to try to improve nutrition, with 5 small meals and Carnation Instant as supplement. I have also encouraged her to continue to increase gentle activity as tolerated. Patient feels that every 3 week dosing would be fine to continue. If surgical wound is more uncomfortable, will need to be seen again by gyn onology.   Assessment/Plan:  1. High grade papillary serous left ovarian carcinoma: post interval R1 resection on 11-28-14 after 5 cycles of neoadjuvant chemotherapy, now to continue additional 3 cycles of carbo taxol beginning 12-21-14. She will have neulasta on 12-25-14 and I will see her with labs on 01-01-15. Continue prophylactic lovenox, planned thru ~ mid June (will need to be aware if platelets drop with chemo). 2.area of  tenderness and slight erythema at surgical incision: plan as above. She is not on antibiotics. 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 Due bilateral mammograms at [facility] about now, which we will need to follow up. With BRCA 2 mutation, she should have breast MRIs within 3 months of mammograms yearly. 5.PAC in 6.nutritional status again of concern since recent surgery and with upcoming chemotherapy: recommendations as above and I will ask [facility] dietician to follow up (thank you) 7.RBBB and LAFB, HTN, known to Dr Acie Fredrickson 8.chemo anemia: units PRBCs post op, hgb 10.3 today. Continuing oral iron as hemocyte 9.hypothyroidism on replacement 10.post parathyroidectomy 2005 11.rash central face despite no steroids in over a month:  may be rosacea  All questions answered. Chemo and neulasta orders confirmed, message to managed care re continued treatment. Time spent 30 min including >50% counseling and coordination of care.   Delailah Spieth P, MD   12/15/2014, 3:03 PM

## 2014-12-17 DIAGNOSIS — Z1501 Genetic susceptibility to malignant neoplasm of breast: Secondary | ICD-10-CM

## 2014-12-17 DIAGNOSIS — R634 Abnormal weight loss: Secondary | ICD-10-CM | POA: Insufficient documentation

## 2014-12-17 DIAGNOSIS — Z1509 Genetic susceptibility to other malignant neoplasm: Secondary | ICD-10-CM

## 2014-12-17 DIAGNOSIS — C569 Malignant neoplasm of unspecified ovary: Secondary | ICD-10-CM | POA: Insufficient documentation

## 2014-12-19 ENCOUNTER — Encounter: Payer: Self-pay | Admitting: Oncology

## 2014-12-19 ENCOUNTER — Telehealth: Payer: Self-pay | Admitting: Gynecologic Oncology

## 2014-12-19 ENCOUNTER — Telehealth: Payer: Self-pay

## 2014-12-19 DIAGNOSIS — C569 Malignant neoplasm of unspecified ovary: Secondary | ICD-10-CM

## 2014-12-19 MED ORDER — DEXAMETHASONE 4 MG PO TABS: ORAL_TABLET | ORAL | Status: DC

## 2014-12-19 NOTE — Progress Notes (Signed)
I faxed disability claim form to [phone]

## 2014-12-19 NOTE — Telephone Encounter (Signed)
-----   Message from Gordy Levan, MD sent at 12/17/2014 10:46 AM EDT ----- May need decadron for taxol premed - I believe we used full 20 mg 12 hrs and 6 hrs prior, but I cannot tell that from med list  Will do 3 more cycles  (see message to [facility] on this lady also - thanks)

## 2014-12-19 NOTE — Telephone Encounter (Signed)
Spoke the patient her incision.  Stating "it is doing much better."  Denies erythema or drainage.  Reports minimal firmness present but much smaller than what was present during her visit with Dr. Marko Plume.  She is concerned about her incision under her umbilicus.  She states she feels "they may have left a staple in there."  Unable to see a piece of metal but states there is something sharp and pinpoint.  Staples removed at discharge by RN staff at [facility].  She is to stop by on Thursday when she comes for chemo to have the incision assessed.  No concerns voiced.  Advised to call for any needs.

## 2014-12-19 NOTE — Telephone Encounter (Signed)
Kathleen Bond has enough decadron for treatment 12-21-14.  Will send 20 tabs to her pharmacy for the last 2 treatments.

## 2014-12-21 ENCOUNTER — Telehealth: Payer: Self-pay | Admitting: *Deleted

## 2014-12-21 ENCOUNTER — Ambulatory Visit (HOSPITAL_BASED_OUTPATIENT_CLINIC_OR_DEPARTMENT_OTHER): Payer: 59

## 2014-12-21 ENCOUNTER — Ambulatory Visit: Payer: 59 | Admitting: Oncology

## 2014-12-21 ENCOUNTER — Other Ambulatory Visit (HOSPITAL_BASED_OUTPATIENT_CLINIC_OR_DEPARTMENT_OTHER): Payer: 59

## 2014-12-21 ENCOUNTER — Ambulatory Visit: Payer: 59 | Admitting: Nutrition

## 2014-12-21 ENCOUNTER — Other Ambulatory Visit: Payer: Self-pay | Admitting: Oncology

## 2014-12-21 ENCOUNTER — Other Ambulatory Visit: Payer: 59

## 2014-12-21 VITALS — BP 107/75 | HR 100 | Temp 98.9°F | Resp 18

## 2014-12-21 DIAGNOSIS — C562 Malignant neoplasm of left ovary: Secondary | ICD-10-CM | POA: Diagnosis not present

## 2014-12-21 DIAGNOSIS — Z5111 Encounter for antineoplastic chemotherapy: Secondary | ICD-10-CM

## 2014-12-21 DIAGNOSIS — C561 Malignant neoplasm of right ovary: Secondary | ICD-10-CM

## 2014-12-21 DIAGNOSIS — C569 Malignant neoplasm of unspecified ovary: Secondary | ICD-10-CM

## 2014-12-21 LAB — COMPREHENSIVE METABOLIC PANEL (CC13)
ALT: 8 U/L (ref 0–55)
AST: 9 U/L (ref 5–34)
Albumin: 3.9 g/dL (ref 3.5–5.0)
Alkaline Phosphatase: 84 U/L (ref 40–150)
Anion Gap: 12 mEq/L — ABNORMAL HIGH (ref 3–11)
BUN: 14.7 mg/dL (ref 7.0–26.0)
CALCIUM: 9.6 mg/dL (ref 8.4–10.4)
CHLORIDE: 100 meq/L (ref 98–109)
CO2: 26 mEq/L (ref 22–29)
Creatinine: 0.7 mg/dL (ref 0.6–1.1)
Glucose: 153 mg/dl — ABNORMAL HIGH (ref 70–140)
Potassium: 3.7 mEq/L (ref 3.5–5.1)
Sodium: 138 mEq/L (ref 136–145)
TOTAL PROTEIN: 7.9 g/dL (ref 6.4–8.3)
Total Bilirubin: 0.3 mg/dL (ref 0.20–1.20)

## 2014-12-21 LAB — CBC WITH DIFFERENTIAL/PLATELET
BASO%: 0.2 % (ref 0.0–2.0)
BASOS ABS: 0 10*3/uL (ref 0.0–0.1)
EOS%: 0 % (ref 0.0–7.0)
Eosinophils Absolute: 0 10*3/uL (ref 0.0–0.5)
HCT: 33.1 % — ABNORMAL LOW (ref 34.8–46.6)
HGB: 10.8 g/dL — ABNORMAL LOW (ref 11.6–15.9)
LYMPH%: 7.3 % — ABNORMAL LOW (ref 14.0–49.7)
MCH: 30.4 pg (ref 25.1–34.0)
MCHC: 32.5 g/dL (ref 31.5–36.0)
MCV: 93.6 fL (ref 79.5–101.0)
MONO#: 0.1 10*3/uL (ref 0.1–0.9)
MONO%: 0.7 % (ref 0.0–14.0)
NEUT#: 6.9 10*3/uL — ABNORMAL HIGH (ref 1.5–6.5)
NEUT%: 91.8 % — ABNORMAL HIGH (ref 38.4–76.8)
PLATELETS: 346 10*3/uL (ref 145–400)
RBC: 3.54 10*6/uL — AB (ref 3.70–5.45)
RDW: 16.8 % — ABNORMAL HIGH (ref 11.2–14.5)
WBC: 7.5 10*3/uL (ref 3.9–10.3)
lymph#: 0.5 10*3/uL — ABNORMAL LOW (ref 0.9–3.3)

## 2014-12-21 MED ORDER — SODIUM CHLORIDE 0.9 % IV SOLN
Freq: Once | INTRAVENOUS | Status: AC
  Administered 2014-12-21: 11:00:00 via INTRAVENOUS

## 2014-12-21 MED ORDER — HEPARIN SOD (PORK) LOCK FLUSH 100 UNIT/ML IV SOLN
500.0000 [IU] | Freq: Once | INTRAVENOUS | Status: AC | PRN
  Administered 2014-12-21: 500 [IU]
  Filled 2014-12-21: qty 5

## 2014-12-21 MED ORDER — SODIUM CHLORIDE 0.9 % IV SOLN
Freq: Once | INTRAVENOUS | Status: AC
  Administered 2014-12-21: 11:00:00 via INTRAVENOUS
  Filled 2014-12-21: qty 8

## 2014-12-21 MED ORDER — SODIUM CHLORIDE 0.9 % IV SOLN
414.0000 mg | Freq: Once | INTRAVENOUS | Status: AC
  Administered 2014-12-21: 410 mg via INTRAVENOUS
  Filled 2014-12-21: qty 41

## 2014-12-21 MED ORDER — SODIUM CHLORIDE 0.9 % IJ SOLN
10.0000 mL | INTRAMUSCULAR | Status: DC | PRN
  Administered 2014-12-21: 10 mL
  Filled 2014-12-21: qty 10

## 2014-12-21 MED ORDER — DIPHENHYDRAMINE HCL 50 MG/ML IJ SOLN
INTRAMUSCULAR | Status: AC
  Filled 2014-12-21: qty 1

## 2014-12-21 MED ORDER — FAMOTIDINE IN NACL 20-0.9 MG/50ML-% IV SOLN
INTRAVENOUS | Status: AC
  Filled 2014-12-21: qty 50

## 2014-12-21 MED ORDER — DIPHENHYDRAMINE HCL 50 MG/ML IJ SOLN
25.0000 mg | Freq: Once | INTRAMUSCULAR | Status: AC
  Administered 2014-12-21: 25 mg via INTRAVENOUS

## 2014-12-21 MED ORDER — PACLITAXEL CHEMO INJECTION 300 MG/50ML
175.0000 mg/m2 | Freq: Once | INTRAVENOUS | Status: AC
  Administered 2014-12-21: 258 mg via INTRAVENOUS
  Filled 2014-12-21: qty 43

## 2014-12-21 MED ORDER — FAMOTIDINE IN NACL 20-0.9 MG/50ML-% IV SOLN
20.0000 mg | Freq: Once | INTRAVENOUS | Status: AC
  Administered 2014-12-21: 20 mg via INTRAVENOUS

## 2014-12-21 NOTE — Telephone Encounter (Signed)
Faxed completed "continuing disability" papers to [employer]

## 2014-12-21 NOTE — Progress Notes (Signed)
Nutrition follow-up completed with patient receiving chemotherapy for ovarian cancer. Patient is status post recent surgery. Patient states appetite and oral intake have improved. Weight has increased and was documented as 118 pounds which is increased from 114.9 pounds March 17. Patient denies nutrition impact symptoms.  Nutrition diagnosis: Unintended weight loss improved.  Intervention: Educated patient to increase overall protein intake to support continued healing. Reviewed high protein foods with patient and encouraged her to review fact sheet, which was provided previously. Patient agreeable to try Carnation breakfast essentials.  Provided coupons. Questions were answered.  Teach back method used.  Monitoring, evaluation, goals: Patient will work to continue increased oral intake to promote healing.  Next visit: To be scheduled as needed.  **Disclaimer: This note was dictated with voice recognition software. Similar sounding words can inadvertently be transcribed and this note may contain transcription errors which may not have been corrected upon publication of note.**

## 2014-12-21 NOTE — Patient Instructions (Signed)
Cancer Center Discharge Instructions for Patients Receiving Chemotherapy  Today you received the following chemotherapy agents Taxol/Carboplatin  To help prevent nausea and vomiting after your treatment, we encourage you to take your nausea medication    If you develop nausea and vomiting that is not controlled by your nausea medication, call the clinic.   BELOW ARE SYMPTOMS THAT SHOULD BE REPORTED IMMEDIATELY:  *FEVER GREATER THAN 100.5 F  *CHILLS WITH OR WITHOUT FEVER  NAUSEA AND VOMITING THAT IS NOT CONTROLLED WITH YOUR NAUSEA MEDICATION  *UNUSUAL SHORTNESS OF BREATH  *UNUSUAL BRUISING OR BLEEDING  TENDERNESS IN MOUTH AND THROAT WITH OR WITHOUT PRESENCE OF ULCERS  *URINARY PROBLEMS  *BOWEL PROBLEMS  UNUSUAL RASH Items with * indicate a potential emergency and should be followed up as soon as possible.  Feel free to call the clinic you have any questions or concerns. The clinic phone number is [phone].  Please show the CHEMO ALERT CARD at check-in to the Emergency Department and triage nurse.   

## 2014-12-25 ENCOUNTER — Ambulatory Visit (HOSPITAL_BASED_OUTPATIENT_CLINIC_OR_DEPARTMENT_OTHER): Payer: 59

## 2014-12-25 ENCOUNTER — Encounter: Payer: Self-pay | Admitting: Gynecologic Oncology

## 2014-12-25 ENCOUNTER — Ambulatory Visit: Payer: 59 | Attending: Gynecologic Oncology | Admitting: Gynecologic Oncology

## 2014-12-25 VITALS — BP 118/81 | HR 90 | Temp 98.6°F

## 2014-12-25 VITALS — BP 111/71 | HR 89 | Temp 98.3°F | Resp 18 | Ht 62.0 in | Wt 117.0 lb

## 2014-12-25 DIAGNOSIS — C569 Malignant neoplasm of unspecified ovary: Secondary | ICD-10-CM | POA: Insufficient documentation

## 2014-12-25 DIAGNOSIS — C562 Malignant neoplasm of left ovary: Secondary | ICD-10-CM | POA: Diagnosis not present

## 2014-12-25 DIAGNOSIS — Z5189 Encounter for other specified aftercare: Secondary | ICD-10-CM

## 2014-12-25 DIAGNOSIS — C561 Malignant neoplasm of right ovary: Secondary | ICD-10-CM | POA: Diagnosis not present

## 2014-12-25 MED ORDER — PEGFILGRASTIM INJECTION 6 MG/0.6ML ~~LOC~~
6.0000 mg | PREFILLED_SYRINGE | Freq: Once | SUBCUTANEOUS | Status: AC
  Administered 2014-12-25: 6 mg via SUBCUTANEOUS
  Filled 2014-12-25: qty 0.6

## 2014-12-25 MED ORDER — DUODERM CGF DRESSING EX MISC: 1.0000 | CUTANEOUS | Status: DC

## 2014-12-25 NOTE — Progress Notes (Signed)
OVARIAN CANCER FOLLOWUP VISIT Assessment:    64 y.o. 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   S/p exploratory laparotomy TAH, BSO, omentectomy and radical tumor debulking on 11/28/14. Doing well from a postoperative standpoint.  Plan: 1) Continue adjuvant chemotherapy with 2 more additional cycles of paclitaxel and carboplatin. 2) CT imaging and CA 125 assessment after 3rd cycle (accumulative cycle #8) 3) I will see Kathleen Bond back after she has completed adjuvant therapy. 4) the patient has PDS which is uncomfortable below the incision. Prescribed protective duoderm or blister pad.  HPI:  Kathleen Bond is a 64 y.o. year old G78 initially seen in consultation on 07/25/14 for stage IIIC ovarian cancer.  She then underwent an exploratory laparotomy and omental biopsy on 08/15/14. It was complicated by inability to complete a debulking procedure due to extensive peritoneal involvement of small and large intestine and complete peritoneal involvement with tumor.  Her postoperative course was uncomplicated and she was started on cycle 1 of paclitaxel and carboplatin on 08/18/14.  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  She went on to receive 5 cycles of neoadjuvant chemotherapy with paclitaxel and carboplatin. CA 125 assessments with each cycle have shown considerable positive response (from 1162 upon commencement, to 76 on 10/05/14 after cycle 3).  CT of the chest, abdomen and pelvis on 10/16/14 revealed good response to therapy with significant decrease in pelvic mass size (5cm), resolution of ascites and lymphadenopathy, persistent but signifcantly reduced omental cake. The CT did show a left breast increased density. She feels it is the same from post-treatment for breast cancer. She is having a followup mammogram in 1 week.  On 11/28/14 she underwent ex lap, TAH, BSO, omentectomy and radical tumor  debulking. An R1 resection was performed with residual disease comprising millial studding (16m implants) on serosal small intestinal surface.   She is day 8 of cycle 6 of adjuvant chemotherapy (of a planned total course of 8 cycles) with carboplatin and paclitaxel.  Interval history: Patient has noted a sharp lump in the sub Q tissues under incision. Concerned it is a residual staple. She is feeling very fatigued after this last cycle of chemotherapy.   Review of systems: Constitutional:  She has no weight gain or weight loss. She has no fever or chills. Eyes: No blurred vision Ears, Nose, Mouth, Throat: No dizziness, headaches or changes in hearing. No mouth sores. Cardiovascular: No chest pain, palpitations or edema. Respiratory:  No shortness of breath, wheezing or cough Gastrointestinal: She has normal bowel movements without diarrhea or constipation. She denies any nausea or vomiting. She denies blood in her stool or heart burn. Genitourinary:  She denies pelvic pain, pelvic pressure or changes in her urinary function. She has no hematuria, dysuria, or incontinence. She has no irregular vaginal bleeding or vaginal discharge Musculoskeletal: Denies muscle weakness or joint pains.  Skin:  She has no skin changes, rashes or itching Neurological:  Denies dizziness or headaches. No neuropathy, no numbness or tingling. Psychiatric:  She denies depression or anxiety. Hematologic/Lymphatic:   No easy bruising or bleeding  No Known Allergies  Current Outpatient Prescriptions on File Prior to Visit  Medication Sig Dispense Refill  . dexamethasone (DECADRON) 4 MG tablet Take 5 tabs with food 12 hrs and 6 hrs prior to Taxol chemotherapy. 20 tablet 0  . docusate sodium (COLACE) 100 MG capsule Take 100 mg by mouth daily as needed for  mild constipation.    . enalapril (VASOTEC) 10 MG tablet Take 10 mg by mouth every morning.    . enoxaparin (LOVENOX) 40 MG/0.4ML injection Inject 0.4 mLs (40 mg  total) into the skin daily. 22 Syringe 0  . ferrous fumarate (HEMOCYTE) 325 (106 FE) MG TABS tablet Take one tablet by mouth daily on an empty stomach with orange juice. 30 each 4  . levothyroxine (SYNTHROID, LEVOTHROID) 150 MCG tablet Take 150 mcg by mouth daily before breakfast.    . lidocaine-prilocaine (EMLA) cream Apply 1 application topically as needed (to PAC prior to access).    . LORazepam (ATIVAN) 0.5 MG tablet Take 0.5 mg by mouth every 6 (six) hours as needed (nausea/vomitting.).     Marland Kitchen traMADol (ULTRAM) 50 MG tablet Take 1-2 tablets (50-100 mg total) by mouth every 6 (six) hours as needed (pain). 30 tablet 0  . Vitamin D, Ergocalciferol, (DRISDOL) 50000 UNITS CAPS capsule Take 50,000 Units by mouth every 7 (seven) days.    . furosemide (LASIX) 20 MG tablet Take 20 mg by mouth at bedtime.    . ondansetron (ZOFRAN) 8 MG tablet Take 1 tablet (8 mg total) by mouth every 8 (eight) hours as needed for nausea or vomiting. (Patient not taking: Reported on 11/23/2014) 20 tablet 1  . pantoprazole (PROTONIX) 40 MG tablet Take 1 tablet (40 mg total) by mouth daily. (Patient not taking: Reported on 11/09/2014) 30 tablet 2  . prochlorperazine (COMPAZINE) 10 MG tablet Take 1 tablet (10 mg total) by mouth every 6 (six) hours as needed for nausea or vomiting. (Patient not taking: Reported on 11/23/2014) 30 tablet 0  . sucralfate (CARAFATE) 1 GM/10ML suspension Take 10 mLs (1 g total) by mouth 4 (four) times daily -  with meals and at bedtime. (Patient not taking: Reported on 11/09/2014) 420 mL 0   No current facility-administered medications on file prior to visit.    Past Medical History  Diagnosis Date  . Hypertension   . Hypothyroidism   . Breast cancer 2004    left breast lumpectomy  . Ovarian ca dx'd 07/2014  . BRCA2 positive     BRCA2 p.[id number]*  . Anemia     Past Surgical History  Procedure Laterality Date  . Left breast lumpectomy  2004  . Parathyroidectomy  2005  . Tubal ligation Bilateral    . Laparotomy N/A 08/15/2014    Procedure: EXPLORATORY LAPAROTOMY;  Surgeon: Everitt Amber, MD;  Location: [facility];  Service: Gynecology;  Laterality: N/A;  . Omentectomy N/A 08/15/2014    Procedure: biopsy of omentum;  Surgeon: Everitt Amber, MD;  Location: [facility];  Service: Gynecology;  Laterality: N/A;  . Abdominal hysterectomy N/A 11/28/2014    Procedure: TOTAL HYSTERECTOMY ABDOMINAL/EXPLORATORY LAPAROTOMY;  Surgeon: Everitt Amber, MD;  Location: [facility];  Service: Gynecology;  Laterality: N/A;  . Salpingoophorectomy Bilateral 11/28/2014    Procedure: BILATERAL SALPINGO OOPHORECTOMY/OMENTECTOMY/POSSIBLE BOWEL RESECTION;  Surgeon: Everitt Amber, MD;  Location: [facility];  Service: Gynecology;  Laterality: Bilateral;  . Debulking  11/28/2014    Procedure: TUMOR DEBULKING;  Surgeon: Everitt Amber, MD;  Location: [facility];  Service: Gynecology;;  . Sharlene Motts  11/28/2014    Procedure: OMENTECTOMY;  Surgeon: Everitt Amber, MD;  Location: [facility];  Service: Gynecology;;    Family History  Problem Relation Age of Onset  . Hypertension Mother   . Asthma Father   . Bladder Cancer Brother 20    deceased; smoker    History   Social History  .  Marital Status: Single    Spouse Name: N/A  . Number of Children: N/A  . Years of Education: N/A   Occupational History  . Not on file.   Social History Main Topics  . Smoking status: Never Smoker   . Smokeless tobacco: Never Used  . Alcohol Use: Yes     Comment: occasional glass of wine   . Drug Use: No  . Sexual Activity: Not on file   Other Topics Concern  . Not on file   Social History Narrative     Physical Exam: Blood pressure 111/71, pulse 89, temperature 98.3 F (36.8 C), temperature source Oral, resp. rate 18, height _0  (1.575 m), weight 117 lb (53.071 kg). General: Well dressed, well nourished in no apparent distress.   HEENT:  Normocephalic and atraumatic, no lesions.  Extraocular muscles intact. Sclerae anicteric. Pupils equal, round, reactive. No  mouth sores or ulcers. Thyroid is normal size, not nodular, midline. Skin:  No lesions or rashes. Breasts:  deferred. Lungs:  deferred Cardiovascular:  deferred Abdomen:  Soft, nontender, nondistended.  No palpable masses.  No hepatosplenomegaly.  No ascites. Normal bowel sounds.  No hernias.  Incision is healed. The knot from the PDS fascial suture is palpable at the midline lower third of the incision. Genitourinary: deferred Extremities: No cyanosis, clubbing or edema.  No calf tenderness or erythema. No palpable cords. Psychiatric: Mood and affect are appropriate. Neurological: Awake, alert and oriented x 3. Sensation is intact, no neuropathy.  Musculoskeletal: No pain, normal strength and range of motion.   [name], MD

## 2014-12-31 ENCOUNTER — Other Ambulatory Visit: Payer: Self-pay | Admitting: Oncology

## 2014-12-31 DIAGNOSIS — C562 Malignant neoplasm of left ovary: Secondary | ICD-10-CM

## 2014-12-31 DIAGNOSIS — Z1509 Genetic susceptibility to other malignant neoplasm: Principal | ICD-10-CM

## 2014-12-31 DIAGNOSIS — Z1501 Genetic susceptibility to malignant neoplasm of breast: Principal | ICD-10-CM

## 2015-01-01 ENCOUNTER — Telehealth: Payer: Self-pay | Admitting: Oncology

## 2015-01-01 ENCOUNTER — Encounter: Payer: Self-pay | Admitting: Oncology

## 2015-01-01 ENCOUNTER — Other Ambulatory Visit (HOSPITAL_BASED_OUTPATIENT_CLINIC_OR_DEPARTMENT_OTHER): Payer: 59

## 2015-01-01 ENCOUNTER — Ambulatory Visit (HOSPITAL_BASED_OUTPATIENT_CLINIC_OR_DEPARTMENT_OTHER): Payer: 59 | Admitting: Oncology

## 2015-01-01 VITALS — BP 110/71 | HR 91 | Temp 99.1°F | Resp 18 | Ht 62.0 in | Wt 121.4 lb

## 2015-01-01 DIAGNOSIS — D72819 Decreased white blood cell count, unspecified: Secondary | ICD-10-CM

## 2015-01-01 DIAGNOSIS — D6481 Anemia due to antineoplastic chemotherapy: Secondary | ICD-10-CM

## 2015-01-01 DIAGNOSIS — Z1509 Genetic susceptibility to other malignant neoplasm: Principal | ICD-10-CM

## 2015-01-01 DIAGNOSIS — C562 Malignant neoplasm of left ovary: Secondary | ICD-10-CM | POA: Diagnosis not present

## 2015-01-01 DIAGNOSIS — Z1501 Genetic susceptibility to malignant neoplasm of breast: Secondary | ICD-10-CM

## 2015-01-01 DIAGNOSIS — G620 Drug-induced polyneuropathy: Secondary | ICD-10-CM

## 2015-01-01 DIAGNOSIS — C561 Malignant neoplasm of right ovary: Secondary | ICD-10-CM | POA: Diagnosis not present

## 2015-01-01 DIAGNOSIS — C50912 Malignant neoplasm of unspecified site of left female breast: Secondary | ICD-10-CM

## 2015-01-01 DIAGNOSIS — Z95828 Presence of other vascular implants and grafts: Secondary | ICD-10-CM

## 2015-01-01 DIAGNOSIS — D701 Agranulocytosis secondary to cancer chemotherapy: Secondary | ICD-10-CM

## 2015-01-01 DIAGNOSIS — R112 Nausea with vomiting, unspecified: Secondary | ICD-10-CM

## 2015-01-01 DIAGNOSIS — R110 Nausea: Secondary | ICD-10-CM

## 2015-01-01 DIAGNOSIS — Z853 Personal history of malignant neoplasm of breast: Secondary | ICD-10-CM

## 2015-01-01 DIAGNOSIS — T451X5A Adverse effect of antineoplastic and immunosuppressive drugs, initial encounter: Secondary | ICD-10-CM

## 2015-01-01 LAB — CBC WITH DIFFERENTIAL/PLATELET
BASO%: 0.6 % (ref 0.0–2.0)
Basophils Absolute: 0.1 10*3/uL (ref 0.0–0.1)
EOS ABS: 0 10*3/uL (ref 0.0–0.5)
EOS%: 0.2 % (ref 0.0–7.0)
HEMATOCRIT: 33.2 % — AB (ref 34.8–46.6)
HGB: 10.7 g/dL — ABNORMAL LOW (ref 11.6–15.9)
LYMPH%: 12.8 % — ABNORMAL LOW (ref 14.0–49.7)
MCH: 30 pg (ref 25.1–34.0)
MCHC: 32.2 g/dL (ref 31.5–36.0)
MCV: 93.2 fL (ref 79.5–101.0)
MONO#: 1.5 10*3/uL — AB (ref 0.1–0.9)
MONO%: 8.5 % (ref 0.0–14.0)
NEUT%: 77.9 % — ABNORMAL HIGH (ref 38.4–76.8)
NEUTROS ABS: 13.5 10*3/uL — AB (ref 1.5–6.5)
PLATELETS: 230 10*3/uL (ref 145–400)
RBC: 3.56 10*6/uL — AB (ref 3.70–5.45)
RDW: 16.8 % — ABNORMAL HIGH (ref 11.2–14.5)
WBC: 17.3 10*3/uL — AB (ref 3.9–10.3)
lymph#: 2.2 10*3/uL (ref 0.9–3.3)

## 2015-01-01 LAB — COMPREHENSIVE METABOLIC PANEL (CC13)
ALBUMIN: 3.7 g/dL (ref 3.5–5.0)
ALT: 15 U/L (ref 0–55)
ANION GAP: 9 meq/L (ref 3–11)
AST: 15 U/L (ref 5–34)
Alkaline Phosphatase: 96 U/L (ref 40–150)
BUN: 8.3 mg/dL (ref 7.0–26.0)
CO2: 28 mEq/L (ref 22–29)
Calcium: 9.1 mg/dL (ref 8.4–10.4)
Chloride: 101 mEq/L (ref 98–109)
Creatinine: 0.7 mg/dL (ref 0.6–1.1)
Glucose: 69 mg/dl — ABNORMAL LOW (ref 70–140)
Potassium: 4 mEq/L (ref 3.5–5.1)
Sodium: 139 mEq/L (ref 136–145)
Total Bilirubin: <0.2 mg/dL (ref 0.20–1.20)
Total Protein: 7 g/dL (ref 6.4–8.3)

## 2015-01-01 NOTE — Progress Notes (Signed)
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 She had 5 cycles of neoadjuvant carbo taxol thru 11-09-14,  radical debulking 11-28-14 (R1), and cycle 6 carbo taxol on 12-21-14, with neulasta on 12-25-14. We plan total 8 cycles now. She is to see Dr Denman George with CT and CA 125 after total 8 cycles.   Patient was more fatigued with most recent chemotherapy for over a week, staying mostly in bed. She had nausea night of treatment, with prn antiemetics helpful, and was able to drink fluids and to eat some after that. She has no peripheral neuropathy in fingers, some intermittently in toes. Bowels are moving with stool softener. She still has some minimal vaginal spotting since surgery, without itching or pain. She has felt better for last ~ 3 days, and is feeling the best overall today that she has since surgery.   She has palpable suture material at incision, checked by Dr Denman George on 12-25-14. She is keeping the area padded.   She missed appointment for bilateral mammograms at [facility] due to recent surgery, but is glad for [place] to reschedule this. She is not aware of any changes in breasts bilaterally since left mammogram at that facility 10-25-14.   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 Symptoms continued, with early satiety causing poor po intake; she was seen again at [facility] ED in early Jan 2016, with CT reportedly consistent with stage IIIC or IV gyn cancer, including omental caking, large pelvic mass, para-aortic adenopathy, ascites, pleural effusions and right subpleural nodule. She  was seen in consultation by Dr Denman George on 07-25-14, with concern from her exam and scan for rectal involvement. 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 She had paracentesis of 3.4 liters ascites on 07-27-2014, with cytology ([id number]) showing serous carcinoma consistent with gyn primary. She had right thoracentesis for 350 cc on 08-11-14, this requested by anesthesia prior to surgery and apparently not sent for cytology. Patient was more comfortable after paracentesis, but could not tell any difference in breathing after thoracentesis (denies SOB prior to that procedure). CA 125 on 08-02-14 was 1162. She was taken to exploratory laparotomy by Dr Denman George on 08-15-14, with 4 liters of ascites removed and biopsy of omentum obtained, with additional debulking not attempted due to surgical findings of entire peritoneal surface replaced by tumor plaque, small intestine coated with tumor and mesentery tethered with tumor nodules, ileum adherent to pelvic mass, dense plaque of tumor on right diaphragm and 10 cm omental cake in LUQ. Pathology of omental biopsy She needed another paracentesis by IR for 1.3 liters ascites on 08-17-14, and had PAC placed by IR also 08-17-14. Day 1 cycle 1 dose dense carboplatin taxol was given in hospital on 08-18-14. After 2 cycles of dose dense regimen, the carboplatin and taxol was changed to every 3 week regimen beginning cycle 3; she had cycle 5 on 11-09-14, with gCSF support. She had interval debulking by Dr Denman George on 11-28-14. Surgery was exploratory laparotomy with TAH/ BSO, omentectomy and radical tumor debulking; R1 resection with residual milial tumor on small  bowel at completion of procedure. Tumor involved bilateral adnexa, uterine serosa, omentum and falciform ligament, with + LVSI, no nodes examined. Maximum tumor size was 4.3 cm involving left ovary. She resumed carboplatin  Taxol on 12-21-14.  BRCA 2 abnormality  identified 10-12-14.  Breast cancer: Left breast biopsy 11-22-2003 ([id number]) invasive carcinoma. Left breast wide excision with sentinel nodes and axillary contents by Dr Benson Norway 01-03-2004 [phone]) poorly differentiated invasive ductal carcinoma 3.0 cm with closest margin 1.7 cm deep, 6 sentinel nodes + 3 additional nodes all negative, ER 0, PR 0, HER 2 3+, Ki67 of 19%. . She received adjuvant chemotherapy by Dr Sonny Dandy with 5FU, epirubicin, cytoxan x 4 cycles from 02-13-2004 thru 04-16-2004, followed by taxol 80 mg/m2 weekly x 11 from 05-07-2004 thru 09-04-2004, and herceptin 05-21-2004 thru 02-25-2005. Antiemetics were Kytril and decadron. She had PAC for the chemotherapy. She had radiation by Dr Tyler Pita in [place]. Last note from Dr Sonny Dandy in this information was 04-2008. Left diagnostic mammogram [facility] 10-25-2014 stable compared with 12-06-13 and priors; due bilateral mammograms again late May 2016.      Review of systems as above, also: No aches with either taxol or neulasta. No pain in abdomen or pelvis now. No LE swelling, no bleeding. No fever or symptoms of infection.  Remainder of 10 point Review of Systems negative.  Objective:  Vital signs in last 24 hours:  BP 110/71 mmHg  Pulse 91  Temp(Src) 99.1 F (37.3 C) (Oral)  Resp 18  Ht 5' 2"  (1.575 m)  Wt 121 lb 6.4 oz (55.067 kg)  BMI 22.20 kg/m2 Weight up 4 lbs Alert, oriented and appropriate. Ambulatory without difficulty.  Alopecia  HEENT:PERRL, sclerae not icteric. Oral mucosa moist without lesions, posterior pharynx clear.  Neck supple. No JVD.  Lymphatics:no cervical,supraclavicular, axillary or inguinal adenopathy Resp: clear to auscultation bilaterally and normal percussion bilaterally Cardio: regular rate and rhythm. No gallop. GI: soft, nontender, not distended, no mass or organomegaly. Normally active bowel sounds. Surgical incision closed, not tender, palpable area of the suture upper 1/3. Musculoskeletal/  Extremities: without pitting edema, cords, tenderness Neuro: no significant peripheral neuropathy. Otherwise nonfocal. PSYCH appropriate mood and affect Skin slight acneiform rash central face, without ecchymosis, petechiae Breasts: tissue thickened laterally in left breast in area of previous treatment, otherwise without dominant mass, skin or nipple findings. Axillae benign.no swelling UE bilaterally Portacath-without erythema or tenderness  Lab Results:  Results for orders placed or performed in visit on 01/01/15  CBC with Differential  Result Value Ref Range   WBC 17.3 (H) 3.9 - 10.3 10e3/uL   NEUT# 13.5 (H) 1.5 - 6.5 10e3/uL   HGB 10.7 (L) 11.6 - 15.9 g/dL   HCT 33.2 (L) 34.8 - 46.6 %   Platelets 230 145 - 400 10e3/uL   MCV 93.2 79.5 - 101.0 fL   MCH 30.0 25.1 - 34.0 pg   MCHC 32.2 31.5 - 36.0 g/dL   RBC 3.56 (L) 3.70 - 5.45 10e6/uL   RDW 16.8 (H) 11.2 - 14.5 %   lymph# 2.2 0.9 - 3.3 10e3/uL   MONO# 1.5 (H) 0.1 - 0.9 10e3/uL   Eosinophils Absolute 0.0 0.0 - 0.5 10e3/uL   Basophils Absolute 0.1 0.0 - 0.1 10e3/uL   NEUT% 77.9 (H) 38.4 - 76.8 %   LYMPH% 12.8 (L) 14.0 - 49.7 %   MONO% 8.5 0.0 - 14.0 %   EOS% 0.2 0.0 - 7.0 %   BASO% 0.6 0.0 - 2.0 %  Comprehensive  metabolic panel (Cmet) - [facility]  Result Value Ref Range   Sodium 139 136 - 145 mEq/L   Potassium 4.0 3.5 - 5.1 mEq/L   Chloride 101 98 - 109 mEq/L   CO2 28 22 - 29 mEq/L   Glucose 69 (L) 70 - 140 mg/dl   BUN 8.3 7.0 - 26.0 mg/dL   Creatinine 0.7 0.6 - 1.1 mg/dL   Total Bilirubin <0.20 0.20 - 1.20 mg/dL   Alkaline Phosphatase 96 40 - 150 U/L   AST 15 5 - 34 U/L   ALT 15 0 - 55 U/L   Total Protein 7.0 6.4 - 8.3 g/dL   Albumin 3.7 3.5 - 5.0 g/dL   Calcium 9.1 8.4 - 10.4 mg/dL   Anion Gap 9 3 - 11 mEq/L   EGFR >90 >90 ml/min/1.73 m2     Studies/Results:  No results found. Will reschedule bilateral mammograms at [facility]   Medications: I have reviewed the patient's current medications. Will need carbo skin test  beginning cycle 7.   DISCUSSION: neulasta at [facility] vs [facility] injector discussed, fine to change to [facility] when she comes for chemo if she prefers.  It is not surprising that she was more fatigued with most recent treatment, given recent surgery. Encouraged her to increase activity and focus on good nutrition.  Assessment/Plan: 1. High grade papillary serous left ovarian carcinoma IIIC: post interval R1 resection on 11-28-14 after 5 cycles of neoadjuvant chemotherapy,  additional 3 cycles of carbo taxol begun 12-21-14, with neulasta. Continue prophylactic lovenox nearly complete. She will have cycle 7 with carbo skin test on 01-11-15 as long as ANC >=1.5 and plt >=100k, with neulasta by [facility] injector on day 2 or at [facility] ~ 6-27. I will see her back prior to cycle 8. Added compazine to premeds due to increased nausea night of chemo 2.suture palpable at surgical incision: patient reassured by recent gyn onc exam. 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 Due bilateral mammograms at [facility]. With BRCA 2 mutation, she should have breast MRIs within 3 months of mammograms yearly 5.PAC in 6.nutritional status again of concern since recent surgery and with ongoing chemotherapy: appreciate [facility] dietician  follow up on 6-2. 7.RBBB and LAFB, HTN, known to Dr Acie Fredrickson 8.chemo anemia: units PRBCs post op, hgb stable 10.7 today. Continuing oral iron as hemocyte 9.hypothyroidism on replacement 10.post parathyroidectomy 2005 11.rash central face new since on chemo: may be rosacea    Chemo and neulasta orders confirmed, added carbo skin test. All questions answered. Time spent 30 min including >50% counseling and coordination of care.    LIVESAY,LENNIS P, MD   01/01/2015, 3:11 PM

## 2015-01-01 NOTE — Telephone Encounter (Signed)
Appointments made and avs printed for patient °

## 2015-01-02 ENCOUNTER — Other Ambulatory Visit: Payer: Self-pay | Admitting: Oncology

## 2015-01-02 ENCOUNTER — Telehealth: Payer: Self-pay

## 2015-01-02 DIAGNOSIS — G620 Drug-induced polyneuropathy: Secondary | ICD-10-CM | POA: Insufficient documentation

## 2015-01-02 DIAGNOSIS — T451X5A Adverse effect of antineoplastic and immunosuppressive drugs, initial encounter: Secondary | ICD-10-CM

## 2015-01-02 DIAGNOSIS — R112 Nausea with vomiting, unspecified: Secondary | ICD-10-CM | POA: Insufficient documentation

## 2015-01-02 NOTE — Telephone Encounter (Signed)
Left message for scheduling to set up bilateral screening mammogram the week of 01-29-15(per patient's request) and call back to [phone] with appointment date and time. Dr. Mariana Kaufman nurse will call patient with appointment as requested by Ms. Rodman Key.  Spoke with Radiology earlier and the order for mammogram is still in the system, however, all scheduling goes through central scheduling.

## 2015-01-02 NOTE — Telephone Encounter (Signed)
Gave Ms. Kathleen Bond the appointment for her mammogram as noted below.  Pt. wrote appointment down on calendar and verbalized understanding.

## 2015-01-02 NOTE — Telephone Encounter (Signed)
Melissa from Scheduling at [facility] called with a mammogram appointment for 01-30-15 at 1300.  Ms. Skeen is to arrive at the [facility] at 1220 to register.  Ms. Rodena Piety is not to wear power,perfume, or deodorants.

## 2015-01-02 NOTE — Telephone Encounter (Signed)
-----   Message from Gordy Levan, MD sent at 01/01/2015  2:00 PM EDT ----- Please call [facility], their breast radiology center. Patient missed bilateral mammograms late May due to recent other surgery, needs these rescheduled. They can call patient with appointment. They should still have orders, but I can put in again if needed   thanks

## 2015-01-03 ENCOUNTER — Telehealth: Payer: Self-pay

## 2015-01-03 NOTE — Telephone Encounter (Signed)
Reviewed the rationale for the carbo skin test as noted below by Dr. Marko Plume as well as the addition of compazine to her premedications. Ms. Melamed will be driven to the treatment appointment. She verbalized understanding.

## 2015-01-03 NOTE — Telephone Encounter (Signed)
-----   Message from Gordy Levan, MD sent at 01/02/2015  4:32 PM EDT ----- Please let her know prior to chemo on 6-23 1.will do carbo skin test prior to carboplatin from here, as can have allergy reactions more likely after ~6-7 treatments of the carboplatin. Please let her know skin test will take a little longer day of Rx 2.I have added compazine to her chemo premeds thru IV, as she had more nausea night of last treatment

## 2015-01-11 ENCOUNTER — Other Ambulatory Visit (HOSPITAL_BASED_OUTPATIENT_CLINIC_OR_DEPARTMENT_OTHER): Payer: 59

## 2015-01-11 ENCOUNTER — Ambulatory Visit (HOSPITAL_BASED_OUTPATIENT_CLINIC_OR_DEPARTMENT_OTHER): Payer: 59

## 2015-01-11 VITALS — BP 121/82 | HR 94 | Temp 98.7°F | Resp 18

## 2015-01-11 DIAGNOSIS — C562 Malignant neoplasm of left ovary: Secondary | ICD-10-CM

## 2015-01-11 DIAGNOSIS — Z5111 Encounter for antineoplastic chemotherapy: Secondary | ICD-10-CM

## 2015-01-11 DIAGNOSIS — C561 Malignant neoplasm of right ovary: Secondary | ICD-10-CM

## 2015-01-11 DIAGNOSIS — Z1501 Genetic susceptibility to malignant neoplasm of breast: Principal | ICD-10-CM

## 2015-01-11 DIAGNOSIS — Z1509 Genetic susceptibility to other malignant neoplasm: Principal | ICD-10-CM

## 2015-01-11 DIAGNOSIS — C569 Malignant neoplasm of unspecified ovary: Secondary | ICD-10-CM

## 2015-01-11 LAB — CBC WITH DIFFERENTIAL/PLATELET
BASO%: 0 % (ref 0.0–2.0)
Basophils Absolute: 0 10*3/uL (ref 0.0–0.1)
EOS%: 0 % (ref 0.0–7.0)
Eosinophils Absolute: 0 10*3/uL (ref 0.0–0.5)
HEMATOCRIT: 34.5 % — AB (ref 34.8–46.6)
HGB: 11.3 g/dL — ABNORMAL LOW (ref 11.6–15.9)
LYMPH%: 9 % — AB (ref 14.0–49.7)
MCH: 30.8 pg (ref 25.1–34.0)
MCHC: 32.8 g/dL (ref 31.5–36.0)
MCV: 94 fL (ref 79.5–101.0)
MONO#: 0 10*3/uL — AB (ref 0.1–0.9)
MONO%: 0.4 % (ref 0.0–14.0)
NEUT#: 4.5 10*3/uL (ref 1.5–6.5)
NEUT%: 90.6 % — AB (ref 38.4–76.8)
Platelets: 193 10*3/uL (ref 145–400)
RBC: 3.67 10*6/uL — AB (ref 3.70–5.45)
RDW: 16 % — ABNORMAL HIGH (ref 11.2–14.5)
WBC: 4.9 10*3/uL (ref 3.9–10.3)
lymph#: 0.4 10*3/uL — ABNORMAL LOW (ref 0.9–3.3)

## 2015-01-11 LAB — COMPREHENSIVE METABOLIC PANEL (CC13)
ALT: 9 U/L (ref 0–55)
ANION GAP: 10 meq/L (ref 3–11)
AST: 7 U/L (ref 5–34)
Albumin: 4.1 g/dL (ref 3.5–5.0)
Alkaline Phosphatase: 87 U/L (ref 40–150)
BILIRUBIN TOTAL: 0.22 mg/dL (ref 0.20–1.20)
BUN: 12.4 mg/dL (ref 7.0–26.0)
CHLORIDE: 105 meq/L (ref 98–109)
CO2: 25 meq/L (ref 22–29)
Calcium: 9.3 mg/dL (ref 8.4–10.4)
Creatinine: 0.7 mg/dL (ref 0.6–1.1)
EGFR: >90 mL/min/{1.73_m2} (ref >90)
Glucose: 184 mg/dl — ABNORMAL HIGH (ref 70–140)
Potassium: 3.7 mEq/L (ref 3.5–5.1)
Sodium: 140 mEq/L (ref 136–145)
Total Protein: 7.7 g/dL (ref 6.4–8.3)

## 2015-01-11 MED ORDER — PROCHLORPERAZINE EDISYLATE 5 MG/ML IJ SOLN
INTRAMUSCULAR | Status: AC
  Filled 2015-01-11: qty 2

## 2015-01-11 MED ORDER — CARBOPLATIN CHEMO INJECTION 600 MG/60ML
414.0000 mg | Freq: Once | INTRAVENOUS | Status: AC
  Administered 2015-01-11: 410 mg via INTRAVENOUS
  Filled 2015-01-11: qty 41

## 2015-01-11 MED ORDER — CARBOPLATIN CHEMO INTRADERMAL TEST DOSE 100MCG/0.02ML
100.0000 ug | Freq: Once | INTRADERMAL | Status: AC
  Administered 2015-01-11: 100 ug via INTRADERMAL
  Filled 2015-01-11: qty 0.01

## 2015-01-11 MED ORDER — PROCHLORPERAZINE EDISYLATE 5 MG/ML IJ SOLN: 10.0000 mg | Freq: Four times a day (QID) | INTRAMUSCULAR | Status: DC | PRN

## 2015-01-11 MED ORDER — DIPHENHYDRAMINE HCL 50 MG/ML IJ SOLN
25.0000 mg | Freq: Once | INTRAMUSCULAR | Status: AC
  Administered 2015-01-11: 25 mg via INTRAVENOUS

## 2015-01-11 MED ORDER — DIPHENHYDRAMINE HCL 50 MG/ML IJ SOLN
INTRAMUSCULAR | Status: AC
  Filled 2015-01-11: qty 1

## 2015-01-11 MED ORDER — FAMOTIDINE IN NACL 20-0.9 MG/50ML-% IV SOLN
INTRAVENOUS | Status: AC
  Filled 2015-01-11: qty 50

## 2015-01-11 MED ORDER — HEPARIN SOD (PORK) LOCK FLUSH 100 UNIT/ML IV SOLN
500.0000 [IU] | Freq: Once | INTRAVENOUS | Status: AC | PRN
  Administered 2015-01-11: 500 [IU]
  Filled 2015-01-11: qty 5

## 2015-01-11 MED ORDER — SODIUM CHLORIDE 0.9 % IV SOLN
Freq: Once | INTRAVENOUS | Status: AC | PRN
  Administered 2015-01-11: 18:00:00 via INTRAVENOUS
  Filled 2015-01-11 (×2): qty 50

## 2015-01-11 MED ORDER — FAMOTIDINE IN NACL 20-0.9 MG/50ML-% IV SOLN
20.0000 mg | Freq: Once | INTRAVENOUS | Status: AC
  Administered 2015-01-11: 20 mg via INTRAVENOUS

## 2015-01-11 MED ORDER — SODIUM CHLORIDE 0.9 % IV SOLN
250.0000 mL | Freq: Once | INTRAVENOUS | Status: AC
  Administered 2015-01-11: 250 mL via INTRAVENOUS

## 2015-01-11 MED ORDER — SODIUM CHLORIDE 0.9 % IV SOLN
Freq: Once | INTRAVENOUS | Status: AC
  Administered 2015-01-11: 14:00:00 via INTRAVENOUS
  Filled 2015-01-11: qty 8

## 2015-01-11 MED ORDER — SODIUM CHLORIDE 0.9 % IJ SOLN
10.0000 mL | INTRAMUSCULAR | Status: DC | PRN
  Administered 2015-01-11: 10 mL
  Filled 2015-01-11: qty 10

## 2015-01-11 MED ORDER — PACLITAXEL CHEMO INJECTION 300 MG/50ML
175.0000 mg/m2 | Freq: Once | INTRAVENOUS | Status: AC
  Administered 2015-01-11: 258 mg via INTRAVENOUS
  Filled 2015-01-11: qty 43

## 2015-01-11 NOTE — Patient Instructions (Addendum)
[facility] Discharge Instructions for Patients Receiving Chemotherapy  Today you received the following chemotherapy agents Taxol and Carboplatin.  To help prevent nausea and vomiting after your treatment, we encourage you to take your nausea medication as prescribed.   If you develop nausea and vomiting that is not controlled by your nausea medication, call the clinic.   BELOW ARE SYMPTOMS THAT SHOULD BE REPORTED IMMEDIATELY:  *FEVER GREATER THAN 100.5 F  *CHILLS WITH OR WITHOUT FEVER  NAUSEA AND VOMITING THAT IS NOT CONTROLLED WITH YOUR NAUSEA MEDICATION  *UNUSUAL SHORTNESS OF BREATH  *UNUSUAL BRUISING OR BLEEDING  TENDERNESS IN MOUTH AND THROAT WITH OR WITHOUT PRESENCE OF ULCERS  *URINARY PROBLEMS  *BOWEL PROBLEMS  UNUSUAL RASH Items with * indicate a potential emergency and should be followed up as soon as possible.  Feel free to call the clinic you have any questions or concerns. The clinic phone number is [phone].  Please show the [employer] at check-in to the Emergency Department and triage nurse.  Carboplatin injection What is this medicine? CARBOPLATIN (KAR boe pla tin) is a chemotherapy drug. It targets fast dividing cells, like cancer cells, and causes these cells to die. This medicine is used to treat ovarian cancer and many other cancers. This medicine may be used for other purposes; ask your health care provider or pharmacist if you have questions. COMMON BRAND NAME(S): Paraplatin What should I tell my health care provider before I take this medicine? They need to know if you have any of these conditions: -blood disorders -hearing problems -kidney disease -recent or ongoing radiation therapy -an unusual or allergic reaction to carboplatin, cisplatin, other chemotherapy, other medicines, foods, dyes, or preservatives -pregnant or trying to get pregnant -breast-feeding How should I use this medicine? This drug is usually given  as an infusion into a vein. It is administered in a hospital or clinic by a specially trained health care professional. Talk to your pediatrician regarding the use of this medicine in children. Special care may be needed. Overdosage: If you think you have taken too much of this medicine contact a poison control center or emergency room at once. NOTE: This medicine is only for you. Do not share this medicine with others. What if I miss a dose? It is important not to miss a dose. Call your doctor or health care professional if you are unable to keep an appointment. What may interact with this medicine? -medicines for seizures -medicines to increase blood counts like filgrastim, pegfilgrastim, sargramostim -some antibiotics like amikacin, gentamicin, neomycin, streptomycin, tobramycin -vaccines Talk to your doctor or health care professional before taking any of these medicines: -acetaminophen -aspirin -ibuprofen -ketoprofen -naproxen This list may not describe all possible interactions. Give your health care provider a list of all the medicines, herbs, non-prescription drugs, or dietary supplements you use. Also tell them if you smoke, drink alcohol, or use illegal drugs. Some items may interact with your medicine. What should I watch for while using this medicine? Your condition will be monitored carefully while you are receiving this medicine. You will need important blood work done while you are taking this medicine. This drug may make you feel generally unwell. This is not uncommon, as chemotherapy can affect healthy cells as well as cancer cells. Report any side effects. Continue your course of treatment even though you feel ill unless your doctor tells you to stop. In some cases, you may be given additional medicines to help with side effects. Follow  all directions for their use. Call your doctor or health care professional for advice if you get a fever, chills or sore throat, or other  symptoms of a cold or flu. Do not treat yourself. This drug decreases your body's ability to fight infections. Try to avoid being around people who are sick. This medicine may increase your risk to bruise or bleed. Call your doctor or health care professional if you notice any unusual bleeding. Be careful brushing and flossing your teeth or using a toothpick because you may get an infection or bleed more easily. If you have any dental work done, tell your dentist you are receiving this medicine. Avoid taking products that contain aspirin, acetaminophen, ibuprofen, naproxen, or ketoprofen unless instructed by your doctor. These medicines may hide a fever. Do not become pregnant while taking this medicine. Women should inform their doctor if they wish to become pregnant or think they might be pregnant. There is a potential for serious side effects to an unborn child. Talk to your health care professional or pharmacist for more information. Do not breast-feed an infant while taking this medicine. What side effects may I notice from receiving this medicine? Side effects that you should report to your doctor or health care professional as soon as possible: -allergic reactions like skin rash, itching or hives, swelling of the face, lips, or tongue -signs of infection - fever or chills, cough, sore throat, pain or difficulty passing urine -signs of decreased platelets or bleeding - bruising, pinpoint red spots on the skin, black, tarry stools, nosebleeds -signs of decreased red blood cells - unusually weak or tired, fainting spells, lightheadedness -breathing problems -changes in hearing -changes in vision -chest pain -high blood pressure -low blood counts - This drug may decrease the number of white blood cells, red blood cells and platelets. You may be at increased risk for infections and bleeding. -nausea and vomiting -pain, swelling, redness or irritation at the injection site -pain, tingling,  numbness in the hands or feet -problems with balance, talking, walking -trouble passing urine or change in the amount of urine Side effects that usually do not require medical attention (report to your doctor or health care professional if they continue or are bothersome): -hair loss -loss of appetite -metallic taste in the mouth or changes in taste This list may not describe all possible side effects. Call your doctor for medical advice about side effects. You may report side effects to FDA at 1-800-FDA-1088. Where should I keep my medicine? This drug is given in a hospital or clinic and will not be stored at home. NOTE: This sheet is a summary. It may not cover all possible information. If you have questions about this medicine, talk to your doctor, pharmacist, or health care provider.  2015, Elsevier/Gold Standard. (2007-10-12 14:38:05) Paclitaxel injection What is this medicine? PACLITAXEL (PAK li TAX el) is a chemotherapy drug. It targets fast dividing cells, like cancer cells, and causes these cells to die. This medicine is used to treat ovarian cancer, breast cancer, and other cancers. This medicine may be used for other purposes; ask your health care provider or pharmacist if you have questions. COMMON BRAND NAME(S): Onxol, Taxol What should I tell my health care provider before I take this medicine? They need to know if you have any of these conditions: -blood disorders -irregular heartbeat -infection (especially a virus infection such as chickenpox, cold sores, or herpes) -liver disease -previous or ongoing radiation therapy -an unusual or allergic reaction to paclitaxel,  alcohol, polyoxyethylated castor oil, other chemotherapy agents, other medicines, foods, dyes, or preservatives -pregnant or trying to get pregnant -breast-feeding How should I use this medicine? This drug is given as an infusion into a vein. It is administered in a hospital or clinic by a specially trained  health care professional. Talk to your pediatrician regarding the use of this medicine in children. Special care may be needed. Overdosage: If you think you have taken too much of this medicine contact a poison control center or emergency room at once. NOTE: This medicine is only for you. Do not share this medicine with others. What if I miss a dose? It is important not to miss your dose. Call your doctor or health care professional if you are unable to keep an appointment. What may interact with this medicine? Do not take this medicine with any of the following medications: -disulfiram -metronidazole This medicine may also interact with the following medications: -cyclosporine -diazepam -ketoconazole -medicines to increase blood counts like filgrastim, pegfilgrastim, sargramostim -other chemotherapy drugs like cisplatin, doxorubicin, epirubicin, etoposide, teniposide, vincristine -quinidine -testosterone -vaccines -verapamil Talk to your doctor or health care professional before taking any of these medicines: -acetaminophen -aspirin -ibuprofen -ketoprofen -naproxen This list may not describe all possible interactions. Give your health care provider a list of all the medicines, herbs, non-prescription drugs, or dietary supplements you use. Also tell them if you smoke, drink alcohol, or use illegal drugs. Some items may interact with your medicine. What should I watch for while using this medicine? Your condition will be monitored carefully while you are receiving this medicine. You will need important blood work done while you are taking this medicine. This drug may make you feel generally unwell. This is not uncommon, as chemotherapy can affect healthy cells as well as cancer cells. Report any side effects. Continue your course of treatment even though you feel ill unless your doctor tells you to stop. In some cases, you may be given additional medicines to help with side effects.  Follow all directions for their use. Call your doctor or health care professional for advice if you get a fever, chills or sore throat, or other symptoms of a cold or flu. Do not treat yourself. This drug decreases your body's ability to fight infections. Try to avoid being around people who are sick. This medicine may increase your risk to bruise or bleed. Call your doctor or health care professional if you notice any unusual bleeding. Be careful brushing and flossing your teeth or using a toothpick because you may get an infection or bleed more easily. If you have any dental work done, tell your dentist you are receiving this medicine. Avoid taking products that contain aspirin, acetaminophen, ibuprofen, naproxen, or ketoprofen unless instructed by your doctor. These medicines may hide a fever. Do not become pregnant while taking this medicine. Women should inform their doctor if they wish to become pregnant or think they might be pregnant. There is a potential for serious side effects to an unborn child. Talk to your health care professional or pharmacist for more information. Do not breast-feed an infant while taking this medicine. Men are advised not to father a child while receiving this medicine. What side effects may I notice from receiving this medicine? Side effects that you should report to your doctor or health care professional as soon as possible: -allergic reactions like skin rash, itching or hives, swelling of the face, lips, or tongue -low blood counts - This drug may decrease  the number of white blood cells, red blood cells and platelets. You may be at increased risk for infections and bleeding. -signs of infection - fever or chills, cough, sore throat, pain or difficulty passing urine -signs of decreased platelets or bleeding - bruising, pinpoint red spots on the skin, black, tarry stools, nosebleeds -signs of decreased red blood cells - unusually weak or tired, fainting spells,  lightheadedness -breathing problems -chest pain -high or low blood pressure -mouth sores -nausea and vomiting -pain, swelling, redness or irritation at the injection site -pain, tingling, numbness in the hands or feet -slow or irregular heartbeat -swelling of the ankle, feet, hands Side effects that usually do not require medical attention (report to your doctor or health care professional if they continue or are bothersome): -bone pain -complete hair loss including hair on your head, underarms, pubic hair, eyebrows, and eyelashes -changes in the color of fingernails -diarrhea -loosening of the fingernails -loss of appetite -muscle or joint pain -red flush to skin -sweating This list may not describe all possible side effects. Call your doctor for medical advice about side effects. You may report side effects to FDA at 1-800-FDA-1088. Where should I keep my medicine? This drug is given in a hospital or clinic and will not be stored at home. NOTE: This sheet is a summary. It may not cover all possible information. If you have questions about this medicine, talk to your doctor, pharmacist, or health care provider.  2015, Elsevier/Gold Standard. (2012-08-30 16:41:21)

## 2015-01-15 ENCOUNTER — Other Ambulatory Visit: Payer: Self-pay | Admitting: Oncology

## 2015-01-15 ENCOUNTER — Telehealth: Payer: Self-pay | Admitting: Oncology

## 2015-01-15 ENCOUNTER — Telehealth: Payer: Self-pay | Admitting: *Deleted

## 2015-01-15 NOTE — Telephone Encounter (Signed)
s.w. pt and sched appts....pt ok and aware

## 2015-01-15 NOTE — Telephone Encounter (Signed)
Pt called and left message wanting to know about appts for Neulasta injection after chemo.   Shameka, scheduler notified.  Shameka to call pt to see if pt could come today this afternoon for injection. Pt's  Phone    [phone]. Per Fernanda Drum, pt could not come in today, but pt could come tomorrow at 215 pm for injection.

## 2015-01-16 ENCOUNTER — Ambulatory Visit (HOSPITAL_BASED_OUTPATIENT_CLINIC_OR_DEPARTMENT_OTHER): Payer: 59

## 2015-01-16 VITALS — BP 130/83 | HR 92 | Temp 98.5°F | Resp 20

## 2015-01-16 DIAGNOSIS — C561 Malignant neoplasm of right ovary: Secondary | ICD-10-CM | POA: Diagnosis not present

## 2015-01-16 DIAGNOSIS — Z5189 Encounter for other specified aftercare: Secondary | ICD-10-CM | POA: Diagnosis not present

## 2015-01-16 DIAGNOSIS — C569 Malignant neoplasm of unspecified ovary: Secondary | ICD-10-CM

## 2015-01-16 MED ORDER — PEGFILGRASTIM INJECTION 6 MG/0.6ML ~~LOC~~
6.0000 mg | PREFILLED_SYRINGE | Freq: Once | SUBCUTANEOUS | Status: AC
  Administered 2015-01-16: 6 mg via SUBCUTANEOUS
  Filled 2015-01-16: qty 0.6

## 2015-01-22 ENCOUNTER — Other Ambulatory Visit: Payer: Self-pay | Admitting: Oncology

## 2015-01-22 DIAGNOSIS — Z1509 Genetic susceptibility to other malignant neoplasm: Principal | ICD-10-CM

## 2015-01-22 DIAGNOSIS — Z1501 Genetic susceptibility to malignant neoplasm of breast: Principal | ICD-10-CM

## 2015-01-22 DIAGNOSIS — C562 Malignant neoplasm of left ovary: Secondary | ICD-10-CM

## 2015-01-25 ENCOUNTER — Encounter: Payer: Self-pay | Admitting: Oncology

## 2015-01-25 ENCOUNTER — Ambulatory Visit (HOSPITAL_BASED_OUTPATIENT_CLINIC_OR_DEPARTMENT_OTHER): Payer: 59 | Admitting: Oncology

## 2015-01-25 ENCOUNTER — Telehealth: Payer: Self-pay | Admitting: Oncology

## 2015-01-25 ENCOUNTER — Other Ambulatory Visit (HOSPITAL_BASED_OUTPATIENT_CLINIC_OR_DEPARTMENT_OTHER): Payer: 59

## 2015-01-25 VITALS — BP 130/85 | HR 86 | Temp 98.4°F | Resp 18 | Ht 62.0 in | Wt 124.1 lb

## 2015-01-25 DIAGNOSIS — C562 Malignant neoplasm of left ovary: Secondary | ICD-10-CM

## 2015-01-25 DIAGNOSIS — C561 Malignant neoplasm of right ovary: Secondary | ICD-10-CM | POA: Diagnosis not present

## 2015-01-25 DIAGNOSIS — Z1509 Genetic susceptibility to other malignant neoplasm: Principal | ICD-10-CM

## 2015-01-25 DIAGNOSIS — Z95828 Presence of other vascular implants and grafts: Secondary | ICD-10-CM

## 2015-01-25 DIAGNOSIS — Z5111 Encounter for antineoplastic chemotherapy: Secondary | ICD-10-CM

## 2015-01-25 DIAGNOSIS — G629 Polyneuropathy, unspecified: Secondary | ICD-10-CM | POA: Diagnosis not present

## 2015-01-25 DIAGNOSIS — T451X5A Adverse effect of antineoplastic and immunosuppressive drugs, initial encounter: Secondary | ICD-10-CM

## 2015-01-25 DIAGNOSIS — Z853 Personal history of malignant neoplasm of breast: Secondary | ICD-10-CM

## 2015-01-25 DIAGNOSIS — Z1501 Genetic susceptibility to malignant neoplasm of breast: Principal | ICD-10-CM

## 2015-01-25 DIAGNOSIS — G620 Drug-induced polyneuropathy: Secondary | ICD-10-CM

## 2015-01-25 DIAGNOSIS — D6481 Anemia due to antineoplastic chemotherapy: Secondary | ICD-10-CM

## 2015-01-25 DIAGNOSIS — R112 Nausea with vomiting, unspecified: Secondary | ICD-10-CM

## 2015-01-25 DIAGNOSIS — D701 Agranulocytosis secondary to cancer chemotherapy: Secondary | ICD-10-CM

## 2015-01-25 DIAGNOSIS — C50912 Malignant neoplasm of unspecified site of left female breast: Secondary | ICD-10-CM

## 2015-01-25 LAB — CBC WITH DIFFERENTIAL/PLATELET
BASO%: 0.5 % (ref 0.0–2.0)
Basophils Absolute: 0.1 10*3/uL (ref 0.0–0.1)
EOS ABS: 0 10*3/uL (ref 0.0–0.5)
EOS%: 0.1 % (ref 0.0–7.0)
HEMATOCRIT: 31.5 % — AB (ref 34.8–46.6)
HGB: 10.4 g/dL — ABNORMAL LOW (ref 11.6–15.9)
LYMPH#: 2.4 10*3/uL (ref 0.9–3.3)
LYMPH%: 23.2 % (ref 14.0–49.7)
MCH: 31.2 pg (ref 25.1–34.0)
MCHC: 33.1 g/dL (ref 31.5–36.0)
MCV: 94.2 fL (ref 79.5–101.0)
MONO#: 0.9 10*3/uL (ref 0.1–0.9)
MONO%: 8.9 % (ref 0.0–14.0)
NEUT%: 67.3 % (ref 38.4–76.8)
NEUTROS ABS: 7 10*3/uL — AB (ref 1.5–6.5)
Platelets: 219 10*3/uL (ref 145–400)
RBC: 3.35 10*6/uL — ABNORMAL LOW (ref 3.70–5.45)
RDW: 17.6 % — ABNORMAL HIGH (ref 11.2–14.5)
WBC: 10.4 10*3/uL — AB (ref 3.9–10.3)

## 2015-01-25 LAB — COMPREHENSIVE METABOLIC PANEL (CC13)
ALK PHOS: 104 U/L (ref 40–150)
ALT: 13 U/L (ref 0–55)
AST: 11 U/L (ref 5–34)
Albumin: 3.7 g/dL (ref 3.5–5.0)
Anion Gap: 9 mEq/L (ref 3–11)
BUN: 8.7 mg/dL (ref 7.0–26.0)
CO2: 26 mEq/L (ref 22–29)
Calcium: 9 mg/dL (ref 8.4–10.4)
Chloride: 106 mEq/L (ref 98–109)
Creatinine: 0.7 mg/dL (ref 0.6–1.1)
EGFR: >90 mL/min/{1.73_m2} (ref >90)
Glucose: 88 mg/dl (ref 70–140)
Potassium: 3.5 mEq/L (ref 3.5–5.1)
SODIUM: 140 meq/L (ref 136–145)
Total Bilirubin: <0.2 mg/dL (ref 0.20–1.20)
Total Protein: 7 g/dL (ref 6.4–8.3)

## 2015-01-25 MED ORDER — TRAMADOL HCL 50 MG PO TABS: 50.0000 mg | ORAL_TABLET | Freq: Four times a day (QID) | ORAL | Status: DC | PRN

## 2015-01-25 MED ORDER — GABAPENTIN 100 MG PO CAPS: 100.0000 mg | ORAL_CAPSULE | Freq: Every day | ORAL | Status: DC

## 2015-01-25 NOTE — Progress Notes (Signed)
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 She will have last planned cycle of chemotherapy on 02-01-15 (5 cycles neoadjuvant carbo taxol followed by 3 cycles after interval debulking), then restaging CT prior to follow up with Dr Denman George.  Patient had cycle 7 carbo taxol on 01-11-15, with neulasta on 01-16-15. She was fatigued for ~ a week after that treatment, tho little nausea and no vomiting. Bowels are moving adequately. She has no peripheral neuropathy in hands, tho more continuous in feet, this bothersome at night. She has no difficulty walking. She gets some relief at night with tramadol, but would like to try gabapentin now. Energy is fairly good today.   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 Symptoms continued, with early satiety causing poor po intake; she was seen again at [facility] ED in early Jan 2016, with CT reportedly consistent with stage IIIC or IV gyn cancer, including omental caking, large pelvic mass, para-aortic adenopathy, ascites, pleural effusions and right subpleural nodule. She was seen in consultation by Dr Denman George on 07-25-14, with concern from her exam and scan for rectal involvement. 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 She had paracentesis of 3.4 liters ascites on 07-27-2014, with cytology ([id number]) showing serous carcinoma consistent with gyn primary, ER negative. She had right thoracentesis for 350 cc on  08-11-14, this requested by anesthesia prior to surgery and apparently not sent for cytology. Patient was more comfortable after paracentesis, but could not tell any difference in breathing after thoracentesis (denies SOB prior to that procedure). CA 125 on 08-02-14 was 1162. She was taken to exploratory laparotomy by Dr Denman George on 08-15-14, with 4 liters of ascites removed and biopsy of omentum obtained, with additional debulking not attempted due to surgical findings of entire peritoneal surface replaced by tumor plaque, small intestine coated with tumor and mesentery tethered with tumor nodules, ileum adherent to pelvic mass, dense plaque of tumor on right diaphragm and 10 cm omental cake in LUQ. Pathology of omental biopsy She needed another paracentesis by IR for 1.3 liters ascites on 08-17-14, and had PAC placed by IR also 08-17-14. Day 1 cycle 1 dose dense carboplatin taxol was given in hospital on 08-18-14. After 2 cycles of dose dense regimen, the carboplatin and taxol was changed to every 3 week regimen beginning cycle 3; she had cycle 5 on 11-09-14, with gCSF support. She had interval debulking by Dr Denman George on 11-28-14. Surgery was exploratory laparotomy with TAH/ BSO, omentectomy and radical tumor debulking; R1 resection with residual milial tumor on small bowel at completion of procedure. Tumor involved bilateral adnexa, uterine serosa, omentum and falciform ligament, with + LVSI, no nodes examined. Maximum tumor size was 4.3 cm involving left ovary. She resumed carboplatin Taxol on 12-21-14.  BRCA 2 abnormality identified 10-12-14.  Breast cancer: Left breast biopsy 11-22-2003 ([id number]) invasive carcinoma. Left breast wide excision with sentinel nodes and axillary contents by Dr Benson Norway 01-03-2004 [phone]) poorly differentiated invasive ductal carcinoma 3.0 cm with closest margin  1.7 cm deep, 6 sentinel nodes + 3 additional nodes all negative, ER 0, PR 0, HER 2 3+, Ki67 of 19%. . She received  adjuvant chemotherapy by Dr Sonny Dandy with 5FU, epirubicin, cytoxan x 4 cycles from 02-13-2004 thru 04-16-2004, followed by taxol 80 mg/m2 weekly x 11 from 05-07-2004 thru 09-04-2004, and herceptin 05-21-2004 thru 02-25-2005. Antiemetics were Kytril and decadron. She had PAC for the chemotherapy. She had radiation by Dr Tyler Pita in [place]. Last note from Dr Sonny Dandy in this information was 04-2008. Left diagnostic mammogram [facility] 10-25-2014 stable compared with 12-06-13 and priors; due bilateral mammograms again late May 2016.     Review of systems as above, also: No SOB, no bleeding, no abdominal or pelvic pain. No problems with PAC. No bleeding. Skin rash central face worse again, not pruritic or painful, has not had this prior to these treatments.  Remainder of 10 point Review of Systems negative.  Objective:  Vital signs in last 24 hours:  BP 130/85 mmHg  Pulse 86  Temp(Src) 98.4 F (36.9 C) (Oral)  Resp 18  Ht 5' 2"  (1.575 m)  Wt 124 lb 1.6 oz (56.291 kg)  BMI 22.69 kg/m2  SpO2 100% Weight up 3 lbs. Alert, oriented and appropriate, very pleasant as always. Ambulatory without difficulty.  Complete alopecia  HEENT:PERRL, sclerae not icteric. Oral mucosa moist without lesions, posterior pharynx clear.  Neck supple. No JVD.  Lymphatics:no cervical,supraclavicular, axillary or inguinal adenopathy Resp: clear to auscultation bilaterally and normal percussion bilaterally Cardio: regular rate and rhythm. No gallop. GI: soft, nontender, not distended, no mass or organomegaly. Normally active bowel sounds. Surgical incision not remarkable. Musculoskeletal/ Extremities: without pitting edema, cords, tenderness UE or LE Neuro: peripheral neuropathy slight feet. Otherwise nonfocal. PSYCH appropriate mood and affect Skin acneiform rash central face,  without ecchymosis, petechiae Breasts: Left with well healed lumpectomy area with palpable scarring unchanged there, otherwise bilaterally without  dominant mass, skin or nipple findings. Axillae benign. Portacath-without erythema or tenderness  Lab Results:  Results for orders placed or performed in visit on 01/25/15  CBC with Differential  Result Value Ref Range   WBC 10.4 (H) 3.9 - 10.3 10e3/uL   NEUT# 7.0 (H) 1.5 - 6.5 10e3/uL   HGB 10.4 (L) 11.6 - 15.9 g/dL   HCT 31.5 (L) 34.8 - 46.6 %   Platelets 219 145 - 400 10e3/uL   MCV 94.2 79.5 - 101.0 fL   MCH 31.2 25.1 - 34.0 pg   MCHC 33.1 31.5 - 36.0 g/dL   RBC 3.35 (L) 3.70 - 5.45 10e6/uL   RDW 17.6 (H) 11.2 - 14.5 %   lymph# 2.4 0.9 - 3.3 10e3/uL   MONO# 0.9 0.1 - 0.9 10e3/uL   Eosinophils Absolute 0.0 0.0 - 0.5 10e3/uL   Basophils Absolute 0.1 0.0 - 0.1 10e3/uL   NEUT% 67.3 38.4 - 76.8 %   LYMPH% 23.2 14.0 - 49.7 %   MONO% 8.9 0.0 - 14.0 %   EOS% 0.1 0.0 - 7.0 %   BASO% 0.5 0.0 - 2.0 %  Comprehensive metabolic panel (Cmet) - [facility]  Result Value Ref Range   Sodium 140 136 - 145 mEq/L   Potassium 3.5 3.5 - 5.1 mEq/L   Chloride 106 98 - 109 mEq/L   CO2 26 22 - 29 mEq/L   Glucose 88 70 - 140 mg/dl   BUN 8.7 7.0 - 26.0 mg/dL   Creatinine 0.7 0.6 - 1.1 mg/dL   Total Bilirubin <0.20 0.20 - 1.20  mg/dL   Alkaline Phosphatase 104 40 - 150 U/L   AST 11 5 - 34 U/L   ALT 13 0 - 55 U/L   Total Protein 7.0 6.4 - 8.3 g/dL   Albumin 3.7 3.5 - 5.0 g/dL   Calcium 9.0 8.4 - 10.4 mg/dL   Anion Gap 9 3 - 11 mEq/L   EGFR >90 >90 ml/min/1.73 m2    CA 125 available after visit 8  Studies/Results:  No results found.  Medications: I have reviewed the patient's current medications. Try gabapentin 100 mg at hs for neuropathy. Prophylactic lovenox completed. Nausea better cycle 7 with addition of compazine.  DISCUSSION: reviewed taxol peripheral neuropathy, which hopefully will improve or resolve after treatment completes, tho clearly more in feet since last treatment. Will decrease taxol dose to 135 mg/m2 for last cycle. Recommended lots of exercise and massage for feet. Trial  gabapentin at hs. Discussed On Pro injector for neulasta, which she prefers not to use, so will schedule at [facility] for injection.  Per gyn onc, appointment with Dr Denman George 03-09-15, so will have CT shortly prior.  Mammograms bilateral at [facility] as scheduled (soon)  If facial rash persists after chemo, will ask dermatology to see.   Assessment/Plan:  1. 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  Cycle 8 with carbo skin test on 02-01-15 as long as ANC >=1.5 and plt >=100k, with neulasta at [facility]. She will have repeat CTs and see gyn oncology after cycle 8, repeat labs with that visit. I will see her with PAC flush in Sept, or sooner if needed. 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 Due bilateral mammograms at [facility]. With BRCA 2 mutation, she should have breast MRIs within 3 months of mammograms yearly 5.PAC in, keep for now with flushes every 6-8 weeks 6.nutritional status improving  7.RBBB and LAFB, HTN, known to Dr Acie Fredrickson 8.chemo anemia: units PRBCs post op, hgb 10.4 today. Continuing oral iron as hemocyte 9.hypothyroidism on replacement 10.post parathyroidectomy 2005 11.rash central face new since on chemo: steroid acne vs rosacea, as above  Chemo orders confirmed with changes as above and continue compazine. Neulasta ordered. All questions answered. Message to RN to follow up gabapentin and report of mammograms; also will need to be sure MRI breast ordered within 3 months after mammograms. Time spent 30 min including >50% counseling and coordination of care.     LIVESAY,LENNIS P, MD   01/25/2015, 1:24 PM

## 2015-01-25 NOTE — Telephone Encounter (Signed)
Appointments made and avs printed for pt,comtrast given

## 2015-01-26 ENCOUNTER — Telehealth: Payer: Self-pay

## 2015-01-26 DIAGNOSIS — Z5111 Encounter for antineoplastic chemotherapy: Secondary | ICD-10-CM | POA: Insufficient documentation

## 2015-01-26 DIAGNOSIS — N6320 Unspecified lump in the left breast, unspecified quadrant: Secondary | ICD-10-CM | POA: Insufficient documentation

## 2015-01-26 DIAGNOSIS — G629 Polyneuropathy, unspecified: Secondary | ICD-10-CM | POA: Insufficient documentation

## 2015-01-26 LAB — CA 125: CA 125: 8 U/mL (ref <35)

## 2015-01-26 NOTE — Telephone Encounter (Signed)
Told Ms. Mikesell the result of the CA-125 as noted below by Dr. Marko Plume.

## 2015-01-26 NOTE — Telephone Encounter (Signed)
-----   Message from Gordy Levan, MD sent at 01/26/2015 12:12 PM EDT ----- Labs seen and need follow up: please let her know CA 125 marker down to 8

## 2015-02-01 ENCOUNTER — Other Ambulatory Visit (HOSPITAL_BASED_OUTPATIENT_CLINIC_OR_DEPARTMENT_OTHER): Payer: 59

## 2015-02-01 ENCOUNTER — Ambulatory Visit (HOSPITAL_BASED_OUTPATIENT_CLINIC_OR_DEPARTMENT_OTHER): Payer: 59

## 2015-02-01 VITALS — BP 128/69 | HR 107 | Temp 97.8°F | Resp 18

## 2015-02-01 DIAGNOSIS — C561 Malignant neoplasm of right ovary: Secondary | ICD-10-CM | POA: Diagnosis not present

## 2015-02-01 DIAGNOSIS — Z5111 Encounter for antineoplastic chemotherapy: Secondary | ICD-10-CM | POA: Diagnosis not present

## 2015-02-01 DIAGNOSIS — C562 Malignant neoplasm of left ovary: Secondary | ICD-10-CM

## 2015-02-01 DIAGNOSIS — Z1509 Genetic susceptibility to other malignant neoplasm: Principal | ICD-10-CM

## 2015-02-01 DIAGNOSIS — C569 Malignant neoplasm of unspecified ovary: Secondary | ICD-10-CM

## 2015-02-01 DIAGNOSIS — Z1501 Genetic susceptibility to malignant neoplasm of breast: Principal | ICD-10-CM

## 2015-02-01 LAB — CBC WITH DIFFERENTIAL/PLATELET
BASO%: 0.1 % (ref 0.0–2.0)
BASOS ABS: 0 10*3/uL (ref 0.0–0.1)
EOS%: 0 % (ref 0.0–7.0)
Eosinophils Absolute: 0 10*3/uL (ref 0.0–0.5)
HCT: 32 % — ABNORMAL LOW (ref 34.8–46.6)
HGB: 10.5 g/dL — ABNORMAL LOW (ref 11.6–15.9)
LYMPH#: 0.5 10*3/uL — AB (ref 0.9–3.3)
LYMPH%: 13.9 % — AB (ref 14.0–49.7)
MCH: 30.7 pg (ref 25.1–34.0)
MCHC: 32.7 g/dL (ref 31.5–36.0)
MCV: 93.9 fL (ref 79.5–101.0)
MONO#: 0 10*3/uL — AB (ref 0.1–0.9)
MONO%: 1.2 % (ref 0.0–14.0)
NEUT#: 3.3 10*3/uL (ref 1.5–6.5)
NEUT%: 84.8 % — ABNORMAL HIGH (ref 38.4–76.8)
PLATELETS: 205 10*3/uL (ref 145–400)
RBC: 3.41 10*6/uL — ABNORMAL LOW (ref 3.70–5.45)
RDW: 17.5 % — ABNORMAL HIGH (ref 11.2–14.5)
WBC: 3.9 10*3/uL (ref 3.9–10.3)

## 2015-02-01 LAB — COMPREHENSIVE METABOLIC PANEL (CC13)
ALBUMIN: 4 g/dL (ref 3.5–5.0)
ALK PHOS: 88 U/L (ref 40–150)
ALT: 12 U/L (ref 0–55)
AST: 9 U/L (ref 5–34)
Anion Gap: 11 mEq/L (ref 3–11)
BILIRUBIN TOTAL: 0.2 mg/dL (ref 0.20–1.20)
BUN: 15.8 mg/dL (ref 7.0–26.0)
CHLORIDE: 107 meq/L (ref 98–109)
CO2: 21 mEq/L — ABNORMAL LOW (ref 22–29)
Calcium: 9.8 mg/dL (ref 8.4–10.4)
Creatinine: 0.7 mg/dL (ref 0.6–1.1)
EGFR: >90 mL/min/{1.73_m2} (ref >90)
GLUCOSE: 132 mg/dL (ref 70–140)
POTASSIUM: 3.9 meq/L (ref 3.5–5.1)
Sodium: 139 mEq/L (ref 136–145)
TOTAL PROTEIN: 7.7 g/dL (ref 6.4–8.3)

## 2015-02-01 MED ORDER — HEPARIN SOD (PORK) LOCK FLUSH 100 UNIT/ML IV SOLN
500.0000 [IU] | Freq: Once | INTRAVENOUS | Status: AC | PRN
  Administered 2015-02-01: 500 [IU]
  Filled 2015-02-01: qty 5

## 2015-02-01 MED ORDER — CARBOPLATIN CHEMO INTRADERMAL TEST DOSE 100MCG/0.02ML
100.0000 ug | Freq: Once | INTRADERMAL | Status: AC
  Administered 2015-02-01: 100 ug via INTRADERMAL
  Filled 2015-02-01: qty 0.01

## 2015-02-01 MED ORDER — PACLITAXEL CHEMO INJECTION 300 MG/50ML
135.0000 mg/m2 | Freq: Once | INTRAVENOUS | Status: AC
  Administered 2015-02-01: 198 mg via INTRAVENOUS
  Filled 2015-02-01: qty 33

## 2015-02-01 MED ORDER — DIPHENHYDRAMINE HCL 50 MG/ML IJ SOLN
25.0000 mg | Freq: Once | INTRAMUSCULAR | Status: AC
  Administered 2015-02-01: 25 mg via INTRAVENOUS

## 2015-02-01 MED ORDER — SODIUM CHLORIDE 0.9 % IV SOLN
Freq: Once | INTRAVENOUS | Status: AC
  Administered 2015-02-01: 13:00:00 via INTRAVENOUS

## 2015-02-01 MED ORDER — FAMOTIDINE IN NACL 20-0.9 MG/50ML-% IV SOLN
INTRAVENOUS | Status: AC
  Filled 2015-02-01: qty 50

## 2015-02-01 MED ORDER — PROCHLORPERAZINE EDISYLATE 5 MG/ML IJ SOLN
10.0000 mg | Freq: Four times a day (QID) | INTRAMUSCULAR | Status: DC | PRN
  Administered 2015-02-01: 10 mg via INTRAVENOUS

## 2015-02-01 MED ORDER — PROCHLORPERAZINE EDISYLATE 5 MG/ML IJ SOLN
INTRAMUSCULAR | Status: AC
  Filled 2015-02-01: qty 2

## 2015-02-01 MED ORDER — SODIUM CHLORIDE 0.9 % IJ SOLN
10.0000 mL | INTRAMUSCULAR | Status: DC | PRN
  Administered 2015-02-01: 10 mL
  Filled 2015-02-01: qty 10

## 2015-02-01 MED ORDER — SODIUM CHLORIDE 0.9 % IV SOLN
414.0000 mg | Freq: Once | INTRAVENOUS | Status: AC
  Administered 2015-02-01: 410 mg via INTRAVENOUS
  Filled 2015-02-01: qty 41

## 2015-02-01 MED ORDER — DIPHENHYDRAMINE HCL 50 MG/ML IJ SOLN
INTRAMUSCULAR | Status: AC
  Filled 2015-02-01: qty 1

## 2015-02-01 MED ORDER — FAMOTIDINE IN NACL 20-0.9 MG/50ML-% IV SOLN
20.0000 mg | Freq: Once | INTRAVENOUS | Status: AC
  Administered 2015-02-01: 20 mg via INTRAVENOUS

## 2015-02-01 MED ORDER — SODIUM CHLORIDE 0.9 % IV SOLN
Freq: Once | INTRAVENOUS | Status: AC
  Administered 2015-02-01: 14:00:00 via INTRAVENOUS
  Filled 2015-02-01: qty 8

## 2015-02-01 NOTE — Patient Instructions (Signed)
[facility] Discharge Instructions for Patients Receiving Chemotherapy  Today you received the following chemotherapy agents Taxol/Carboplatin To help prevent nausea and vomiting after your treatment, we encourage you to take your nausea medication as prescribed.   If you develop nausea and vomiting that is not controlled by your nausea medication, call the clinic.   BELOW ARE SYMPTOMS THAT SHOULD BE REPORTED IMMEDIATELY:  *FEVER GREATER THAN 100.5 F  *CHILLS WITH OR WITHOUT FEVER  NAUSEA AND VOMITING THAT IS NOT CONTROLLED WITH YOUR NAUSEA MEDICATION  *UNUSUAL SHORTNESS OF BREATH  *UNUSUAL BRUISING OR BLEEDING  TENDERNESS IN MOUTH AND THROAT WITH OR WITHOUT PRESENCE OF ULCERS  *URINARY PROBLEMS  *BOWEL PROBLEMS  UNUSUAL RASH Items with * indicate a potential emergency and should be followed up as soon as possible.  Feel free to call the clinic you have any questions or concerns. The clinic phone number is [phone].  Please show the CHEMO ALERT CARD at check-in to the Emergency Department and triage nurse.   

## 2015-02-01 NOTE — Progress Notes (Signed)
Ok to treat with pulse 107 per Dr. Marko Plume.

## 2015-02-02 ENCOUNTER — Encounter: Payer: Self-pay | Admitting: Oncology

## 2015-02-02 LAB — CA 125: CA 125: 6 U/mL (ref <35)

## 2015-02-02 NOTE — Progress Notes (Signed)
I faxed form to [phone] [employer]

## 2015-02-05 ENCOUNTER — Ambulatory Visit (HOSPITAL_BASED_OUTPATIENT_CLINIC_OR_DEPARTMENT_OTHER): Payer: 59

## 2015-02-05 VITALS — BP 129/92 | HR 106 | Temp 98.9°F

## 2015-02-05 DIAGNOSIS — Z5189 Encounter for other specified aftercare: Secondary | ICD-10-CM | POA: Diagnosis not present

## 2015-02-05 DIAGNOSIS — C569 Malignant neoplasm of unspecified ovary: Secondary | ICD-10-CM

## 2015-02-05 MED ORDER — PEGFILGRASTIM INJECTION 6 MG/0.6ML ~~LOC~~
6.0000 mg | PREFILLED_SYRINGE | Freq: Once | SUBCUTANEOUS | Status: AC
  Administered 2015-02-05: 6 mg via SUBCUTANEOUS
  Filled 2015-02-05: qty 0.6

## 2015-02-15 ENCOUNTER — Telehealth: Payer: Self-pay | Admitting: *Deleted

## 2015-02-15 NOTE — Telephone Encounter (Signed)
Called pt to follow -up since chemo. No answer and VM is not set up, so I called son, Tiffany Kocher and left a detailed message to his VM and told him we would call back tomorrow. Message to be fwd to Dr. Marko Plume.

## 2015-02-21 ENCOUNTER — Telehealth: Payer: Self-pay

## 2015-02-21 NOTE — Telephone Encounter (Signed)
Ms. 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  She does not need to take it every night.  She takes it prn. Mammogram report received from Kathleen Bond her that it looked fiine.  She just received the letter of results in the mail. Told her that Dr. Marko Bond will order a Breast MRI to be done in the next ~2.5 months.  She will have follow up lab when she sees Dr. Denman Bond on 03-09-15. Ms. Kathleen Bond verbalized understanding.

## 2015-02-21 NOTE — Telephone Encounter (Signed)
-----   Message from Gordy Levan, MD sent at 01/26/2015  8:59 PM EDT ----- For last chemo 7-14. Will have labs when she sees Dr Denman George 8-19, which I need to be sure to see and to let her know results  I don't see her again until 9-12   RN  please check on her 1-2 weeks after chemo: She is for mammograms soon at [facility]. I need to set up MRI breasts ~ 2.5 months after mammograms, so need to be sure to get results from [place] - Kathleen Bond can tell us when they happen, she just did not remember apt date when I saw her.  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  thanks

## 2015-03-08 ENCOUNTER — Other Ambulatory Visit: Payer: Self-pay | Admitting: *Deleted

## 2015-03-08 ENCOUNTER — Other Ambulatory Visit: Payer: Self-pay | Admitting: Oncology

## 2015-03-08 ENCOUNTER — Telehealth: Payer: Self-pay | Admitting: Oncology

## 2015-03-08 ENCOUNTER — Encounter: Payer: Self-pay | Admitting: Oncology

## 2015-03-08 DIAGNOSIS — C562 Malignant neoplasm of left ovary: Secondary | ICD-10-CM

## 2015-03-08 DIAGNOSIS — Z1509 Genetic susceptibility to other malignant neoplasm: Secondary | ICD-10-CM

## 2015-03-08 DIAGNOSIS — C50912 Malignant neoplasm of unspecified site of left female breast: Secondary | ICD-10-CM

## 2015-03-08 DIAGNOSIS — C569 Malignant neoplasm of unspecified ovary: Secondary | ICD-10-CM

## 2015-03-08 DIAGNOSIS — Z1501 Genetic susceptibility to malignant neoplasm of breast: Principal | ICD-10-CM

## 2015-03-08 NOTE — Progress Notes (Signed)
MEDICAL ONCOLOGY  Report received from [facility] for bilateral screening mammograms done 02-13-15 "breast tissue heterogeneously dense" but no mammographic findings of concern reported. Copy sent to be scanned into EMR  L.Ronika Kelson,MD

## 2015-03-08 NOTE — Telephone Encounter (Signed)
Patient is coming in 8/19 for dr Denman George and will talk with her then for mri breast,note placed on appointment

## 2015-03-09 ENCOUNTER — Other Ambulatory Visit: Payer: 59

## 2015-03-09 ENCOUNTER — Ambulatory Visit: Payer: 59 | Admitting: Gynecologic Oncology

## 2015-03-09 ENCOUNTER — Telehealth: Payer: Self-pay

## 2015-03-09 ENCOUNTER — Other Ambulatory Visit: Payer: Self-pay | Admitting: *Deleted

## 2015-03-09 ENCOUNTER — Telehealth: Payer: Self-pay | Admitting: *Deleted

## 2015-03-09 DIAGNOSIS — C562 Malignant neoplasm of left ovary: Secondary | ICD-10-CM

## 2015-03-09 DIAGNOSIS — Z1509 Genetic susceptibility to other malignant neoplasm: Principal | ICD-10-CM

## 2015-03-09 DIAGNOSIS — Z1501 Genetic susceptibility to malignant neoplasm of breast: Principal | ICD-10-CM

## 2015-03-09 NOTE — Telephone Encounter (Signed)
Faxed  completed Disability form Dated 03-04-15 to [employer] and sent a copy to HIM to be scanned into the patient's EMR.

## 2015-03-09 NOTE — Telephone Encounter (Signed)
Received a call from patient to verify appointments. Appointment dates, times and CT Instructions given. Pt verbalized understanding

## 2015-03-12 ENCOUNTER — Encounter (HOSPITAL_COMMUNITY): Payer: Self-pay

## 2015-03-12 ENCOUNTER — Ambulatory Visit (HOSPITAL_COMMUNITY)
Admission: RE | Admit: 2015-03-12 | Discharge: 2015-03-12 | Disposition: A | Discharge status: Home / Self Care | Payer: 59 | Source: Ambulatory Visit | Attending: Gynecologic Oncology | Admitting: Gynecologic Oncology

## 2015-03-12 DIAGNOSIS — Z1502 Genetic susceptibility to malignant neoplasm of ovary: Secondary | ICD-10-CM | POA: Diagnosis not present

## 2015-03-12 DIAGNOSIS — C569 Malignant neoplasm of unspecified ovary: Secondary | ICD-10-CM | POA: Diagnosis not present

## 2015-03-12 DIAGNOSIS — R188 Other ascites: Secondary | ICD-10-CM | POA: Insufficient documentation

## 2015-03-12 DIAGNOSIS — Z1509 Genetic susceptibility to other malignant neoplasm: Secondary | ICD-10-CM

## 2015-03-12 DIAGNOSIS — J984 Other disorders of lung: Secondary | ICD-10-CM | POA: Diagnosis not present

## 2015-03-12 DIAGNOSIS — C562 Malignant neoplasm of left ovary: Secondary | ICD-10-CM

## 2015-03-12 DIAGNOSIS — K8020 Calculus of gallbladder without cholecystitis without obstruction: Secondary | ICD-10-CM | POA: Diagnosis not present

## 2015-03-12 DIAGNOSIS — Z1501 Genetic susceptibility to malignant neoplasm of breast: Secondary | ICD-10-CM | POA: Insufficient documentation

## 2015-03-12 DIAGNOSIS — Z853 Personal history of malignant neoplasm of breast: Secondary | ICD-10-CM | POA: Diagnosis not present

## 2015-03-12 MED ORDER — IOHEXOL 300 MG/ML  SOLN
100.0000 mL | Freq: Once | INTRAMUSCULAR | Status: AC | PRN
  Administered 2015-03-12: 100 mL via INTRAVENOUS

## 2015-03-14 ENCOUNTER — Encounter: Payer: Self-pay | Admitting: Gynecologic Oncology

## 2015-03-14 ENCOUNTER — Telehealth: Payer: Self-pay

## 2015-03-14 ENCOUNTER — Ambulatory Visit: Payer: 59 | Attending: Gynecologic Oncology | Admitting: Gynecologic Oncology

## 2015-03-14 ENCOUNTER — Other Ambulatory Visit (HOSPITAL_BASED_OUTPATIENT_CLINIC_OR_DEPARTMENT_OTHER): Payer: 59

## 2015-03-14 VITALS — BP 123/85 | HR 70 | Temp 98.7°F | Resp 18 | Ht 62.0 in | Wt 131.9 lb

## 2015-03-14 DIAGNOSIS — Z853 Personal history of malignant neoplasm of breast: Secondary | ICD-10-CM | POA: Diagnosis not present

## 2015-03-14 DIAGNOSIS — Z1509 Genetic susceptibility to other malignant neoplasm: Principal | ICD-10-CM

## 2015-03-14 DIAGNOSIS — I10 Essential (primary) hypertension: Secondary | ICD-10-CM | POA: Diagnosis not present

## 2015-03-14 DIAGNOSIS — C563 Malignant neoplasm of bilateral ovaries: Secondary | ICD-10-CM

## 2015-03-14 DIAGNOSIS — Z9889 Other specified postprocedural states: Secondary | ICD-10-CM | POA: Insufficient documentation

## 2015-03-14 DIAGNOSIS — C561 Malignant neoplasm of right ovary: Secondary | ICD-10-CM

## 2015-03-14 DIAGNOSIS — Z1501 Genetic susceptibility to malignant neoplasm of breast: Principal | ICD-10-CM

## 2015-03-14 DIAGNOSIS — Z9221 Personal history of antineoplastic chemotherapy: Secondary | ICD-10-CM | POA: Insufficient documentation

## 2015-03-14 DIAGNOSIS — C569 Malignant neoplasm of unspecified ovary: Secondary | ICD-10-CM | POA: Diagnosis present

## 2015-03-14 DIAGNOSIS — C562 Malignant neoplasm of left ovary: Secondary | ICD-10-CM | POA: Diagnosis not present

## 2015-03-14 DIAGNOSIS — Z90710 Acquired absence of both cervix and uterus: Secondary | ICD-10-CM | POA: Insufficient documentation

## 2015-03-14 DIAGNOSIS — E039 Hypothyroidism, unspecified: Secondary | ICD-10-CM | POA: Insufficient documentation

## 2015-03-14 DIAGNOSIS — Z79899 Other long term (current) drug therapy: Secondary | ICD-10-CM | POA: Insufficient documentation

## 2015-03-14 LAB — COMPREHENSIVE METABOLIC PANEL (CC13)
ALT: 16 U/L (ref 0–55)
ANION GAP: 9 meq/L (ref 3–11)
AST: 14 U/L (ref 5–34)
Albumin: 3.9 g/dL (ref 3.5–5.0)
Alkaline Phosphatase: 89 U/L (ref 40–150)
BILIRUBIN TOTAL: 0.33 mg/dL (ref 0.20–1.20)
BUN: 16.7 mg/dL (ref 7.0–26.0)
CHLORIDE: 106 meq/L (ref 98–109)
CO2: 26 meq/L (ref 22–29)
CREATININE: 0.8 mg/dL (ref 0.6–1.1)
Calcium: 9.4 mg/dL (ref 8.4–10.4)
EGFR: >90 mL/min/{1.73_m2} (ref >90)
GLUCOSE: 91 mg/dL (ref 70–140)
Potassium: 4 mEq/L (ref 3.5–5.1)
Sodium: 141 mEq/L (ref 136–145)
TOTAL PROTEIN: 7.1 g/dL (ref 6.4–8.3)

## 2015-03-14 LAB — CBC WITH DIFFERENTIAL/PLATELET
BASO%: 0.7 % (ref 0.0–2.0)
Basophils Absolute: 0 10*3/uL (ref 0.0–0.1)
EOS%: 0.1 % (ref 0.0–7.0)
Eosinophils Absolute: 0 10*3/uL (ref 0.0–0.5)
HCT: 33.6 % — ABNORMAL LOW (ref 34.8–46.6)
HGB: 11.1 g/dL — ABNORMAL LOW (ref 11.6–15.9)
LYMPH#: 1 10*3/uL (ref 0.9–3.3)
LYMPH%: 28.4 % (ref 14.0–49.7)
MCH: 31.9 pg (ref 25.1–34.0)
MCHC: 32.9 g/dL (ref 31.5–36.0)
MCV: 96.9 fL (ref 79.5–101.0)
MONO#: 0.5 10*3/uL (ref 0.1–0.9)
MONO%: 14.1 % — ABNORMAL HIGH (ref 0.0–14.0)
NEUT%: 56.7 % (ref 38.4–76.8)
NEUTROS ABS: 2.1 10*3/uL (ref 1.5–6.5)
PLATELETS: 295 10*3/uL (ref 145–400)
RBC: 3.46 10*6/uL — AB (ref 3.70–5.45)
RDW: 17.4 % — ABNORMAL HIGH (ref 11.2–14.5)
WBC: 3.7 10*3/uL — AB (ref 3.9–10.3)

## 2015-03-14 NOTE — Progress Notes (Signed)
OVARIAN CANCER FOLLOWUP VISIT Assessment:    64 y.o. year old with stage IIIC high grade ovarian carcinoma withs/p neoadjuvant chemotherapy, S/p exploratory laparotomy TAH, BSO, omentectomy and optimal radical tumor debulking on 11/28/14.s/p an additional 3 cycles of adjuvant chemotherapy. 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  Plan: 1/ followup today's CA 125. I personally reviewed the images from the posttreatment scans and feel that the changes are subtle. I would expect an elevation in CA 125 with that kind of peritoneal disease.  If her CA 125 is stable and normal, I recommend repeating CT in 2 months. If the CA 125 is climbing, this is diagnostic of recurrence, and platinum refractory disease. I would recommend restarting chemotherapy with salvage therapy with Doxil or weekly taxol and avastin.  HPI:  Callan JALEENA VIVIANI is a 64 y.o. year old G82 initially seen in consultation on 07/25/14 for stage IIIC ovarian cancer.  She then underwent an exploratory laparotomy and omental biopsy on 08/15/14. It was complicated by inability to complete a debulking procedure due to extensive peritoneal involvement of small and large intestine and complete peritoneal involvement with tumor.  Her postoperative course was uncomplicated and she was started on cycle 1 of paclitaxel and carboplatin on 08/18/14.  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  She went on to receive 5 cycles of neoadjuvant chemotherapy with paclitaxel and carboplatin. CA 125 assessments with each cycle have shown considerable positive response (from 1162 upon commencement, to 76 on 10/05/14 after cycle 3).  CT of the chest, abdomen and pelvis on 10/16/14 revealed good response to therapy with significant decrease in pelvic mass size (5cm), resolution of ascites and lymphadenopathy, persistent but signifcantly reduced omental cake. The CT did show a left breast increased density. She feels it  is the same from post-treatment for breast cancer. She is having a followup mammogram in 1 week.  On 11/28/14 she underwent ex lap, TAH, BSO, omentectomy and radical tumor debulking. An R1 resection was performed with residual disease comprising millial studding (46m implants) on serosal small intestinal surface.   She is day 8 of cycle 6 of adjuvant chemotherapy (of a planned total course of 8 cycles) with carboplatin and paclitaxel.  She then went on to receive a total of 3 additional cycles of chemotherapy after debulking and normalized her CA 125 to 5U/mL on 01/25/15. She tolerated chemotherapy very well with no major ongoing issues.  Her restaging CT of the abdomen, pelvis and chest performed on8/22/16 revealed There is some fluid and subtle nodularity along the lateral margin of the cecum and ascending colon somewhat suspicious for implants. There are peritoneal implants noted anteriorly in the upper pelvis with mild mass effect on small bowel loops. The chest showed no signs of disease, and the left axillary mass was stable.  Interval history: She is feeling very well off chemotherapy with good appetite, no pain.   Review of systems: Constitutional:  She has no weight gain or weight loss. She has no fever or chills. Eyes: No blurred vision Ears, Nose, Mouth, Throat: No dizziness, headaches or changes in hearing. No mouth sores. Cardiovascular: No chest pain, palpitations or edema. Respiratory:  No shortness of breath, wheezing or cough Gastrointestinal: She has normal bowel movements without diarrhea or constipation. She denies any nausea or vomiting. She denies blood in her stool or heart burn. Genitourinary:  She denies pelvic pain, pelvic pressure or changes in her urinary function. She has no hematuria, dysuria, or  incontinence. She has no irregular vaginal bleeding or vaginal discharge Musculoskeletal: Denies muscle weakness or joint pains.  Skin:  She has no skin changes, rashes  or itching Neurological:  Denies dizziness or headaches. No neuropathy, no numbness or tingling. Psychiatric:  She denies depression or anxiety. Hematologic/Lymphatic:   No easy bruising or bleeding  No Known Allergies  Current Outpatient Prescriptions on File Prior to Visit  Medication Sig Dispense Refill  . Control Gel Formula Dressing (DUODERM CGF DRESSING) MISC Apply 1 each topically once a week. 4 each 4  . dexamethasone (DECADRON) 4 MG tablet Take 5 tabs with food 12 hrs and 6 hrs prior to Taxol chemotherapy. 20 tablet 0  . docusate sodium (COLACE) 100 MG capsule Take 100 mg by mouth daily as needed for mild constipation.    . enalapril (VASOTEC) 10 MG tablet Take 10 mg by mouth every morning.    . ferrous fumarate (HEMOCYTE) 325 (106 FE) MG TABS tablet Take one tablet by mouth daily on an empty stomach with orange juice. 30 each 4  . furosemide (LASIX) 20 MG tablet Take 20 mg by mouth at bedtime.    . gabapentin (NEURONTIN) 100 MG capsule Take 1 capsule (100 mg total) by mouth at bedtime. For neuropathy symptoms.  Will make drowsy. 30 capsule 1  . levothyroxine (SYNTHROID, LEVOTHROID) 150 MCG tablet Take 150 mcg by mouth daily before breakfast.    . lidocaine-prilocaine (EMLA) cream Apply 1 application topically as needed (to PAC prior to access).    . LORazepam (ATIVAN) 0.5 MG tablet Take 0.5 mg by mouth every 6 (six) hours as needed (nausea/vomitting.).     Marland Kitchen ondansetron (ZOFRAN) 8 MG tablet Take 1 tablet (8 mg total) by mouth every 8 (eight) hours as needed for nausea or vomiting. 20 tablet 1  . pantoprazole (PROTONIX) 40 MG tablet Take 1 tablet (40 mg total) by mouth daily. 30 tablet 2  . prochlorperazine (COMPAZINE) 10 MG tablet Take 1 tablet (10 mg total) by mouth every 6 (six) hours as needed for nausea or vomiting. 30 tablet 0  . sucralfate (CARAFATE) 1 GM/10ML suspension Take 10 mLs (1 g total) by mouth 4 (four) times daily -  with meals and at bedtime. 420 mL 0  . traMADol  (ULTRAM) 50 MG tablet Take 1-2 tablets (50-100 mg total) by mouth every 6 (six) hours as needed (pain). 30 tablet 0  . Vitamin D, Ergocalciferol, (DRISDOL) 50000 UNITS CAPS capsule Take 50,000 Units by mouth every 7 (seven) days.     No current facility-administered medications on file prior to visit.    Past Medical History  Diagnosis Date  . Hypertension   . Hypothyroidism   . Breast cancer 2004    left breast lumpectomy  . Ovarian ca dx'd 07/2014  . BRCA2 positive     BRCA2 [id number]  . Anemia     Past Surgical History  Procedure Laterality Date  . Left breast lumpectomy  2004  . Parathyroidectomy  2005  . Tubal ligation Bilateral   . Laparotomy N/A 08/15/2014    Procedure: EXPLORATORY LAPAROTOMY;  Surgeon: Everitt Amber, MD;  Location: [facility];  Service: Gynecology;  Laterality: N/A;  . Omentectomy N/A 08/15/2014    Procedure: biopsy of omentum;  Surgeon: Everitt Amber, MD;  Location: [facility];  Service: Gynecology;  Laterality: N/A;  . Abdominal hysterectomy N/A 11/28/2014    Procedure: TOTAL HYSTERECTOMY ABDOMINAL/EXPLORATORY LAPAROTOMY;  Surgeon: Everitt Amber, MD;  Location: [facility];  Service: Gynecology;  Laterality: N/A;  . Salpingoophorectomy Bilateral 11/28/2014    Procedure: BILATERAL SALPINGO OOPHORECTOMY/OMENTECTOMY/POSSIBLE BOWEL RESECTION;  Surgeon: Everitt Amber, MD;  Location: [facility];  Service: Gynecology;  Laterality: Bilateral;  . Debulking  11/28/2014    Procedure: TUMOR DEBULKING;  Surgeon: Everitt Amber, MD;  Location: [facility];  Service: Gynecology;;  . Sharlene Motts  11/28/2014    Procedure: OMENTECTOMY;  Surgeon: Everitt Amber, MD;  Location: [facility];  Service: Gynecology;;    Family History  Problem Relation Age of Onset  . Hypertension Mother   . Asthma Father   . Bladder Cancer Brother 37    deceased; smoker    Social History   Social History  . Marital Status: Single    Spouse Name: N/A  . Number of Children: N/A  . Years of Education: N/A   Occupational History  .  Not on file.   Social History Main Topics  . Smoking status: Never Smoker   . Smokeless tobacco: Never Used  . Alcohol Use: Yes     Comment: occasional glass of wine   . Drug Use: No  . Sexual Activity: Not on file   Other Topics Concern  . Not on file   Social History Narrative     Physical Exam: Blood pressure 123/85, pulse 70, temperature 98.7 F (37.1 C), temperature source Oral, resp. rate 18, height 5' 2"  (1.575 m), weight 131 lb 14.4 oz (59.829 kg), SpO2 100 %. General: Well dressed, well nourished in no apparent distress.   HEENT:  Normocephalic and atraumatic, no lesions.  Extraocular muscles intact. Sclerae anicteric. Pupils equal, round, reactive. No mouth sores or ulcers. Thyroid is normal size, not nodular, midline. Skin:  No lesions or rashes. Breasts:  deferred. Lungs:  deferred Cardiovascular:  deferred Abdomen:  Soft, nontender, nondistended.  No palpable masses.  No hepatosplenomegaly.  No ascites. Normal bowel sounds.  No hernias.  Incision is healed. No masses Genitourinary: No palpable masses. There is some cystic fullness in the pelvis consistent with pelvic ascites. No nodularity including none on rectovaginal exam. Extremities: No cyanosis, clubbing or edema.  No calf tenderness or erythema. No palpable cords. Psychiatric: Mood and affect are appropriate. Neurological: Awake, alert and oriented x 3. Sensation is intact, no neuropathy.  Musculoskeletal: No pain, normal strength and range of motion.   [name], MD

## 2015-03-14 NOTE — Telephone Encounter (Signed)
Told Kathleen Bond that her chemistries and blood conts are goo.  Her Hgb is up to 11.1 today.  She is to keep appointment with Dr. Marko Plume on 04-02-15 as scheduled.  [name] verbalized understanding.

## 2015-03-15 ENCOUNTER — Telehealth: Payer: Self-pay | Admitting: Gynecologic Oncology

## 2015-03-15 LAB — CA 125: CA 125: 64 U/mL — ABNORMAL HIGH (ref <35)

## 2015-03-15 NOTE — Telephone Encounter (Signed)
Patient informed of CA 125 results and Dr. Serita Grit recommendations to resume chemotherapy.  Verbalizing understanding.  She will be contacted with an appt to see Dr. Marko Plume.  Advised to call for any questions or concerns.

## 2015-03-26 ENCOUNTER — Other Ambulatory Visit: Payer: Self-pay | Admitting: Oncology

## 2015-03-26 DIAGNOSIS — C562 Malignant neoplasm of left ovary: Secondary | ICD-10-CM

## 2015-04-02 ENCOUNTER — Telehealth: Payer: Self-pay | Admitting: Oncology

## 2015-04-02 ENCOUNTER — Other Ambulatory Visit: Payer: Self-pay | Admitting: Oncology

## 2015-04-02 ENCOUNTER — Encounter: Payer: Self-pay | Admitting: Oncology

## 2015-04-02 ENCOUNTER — Other Ambulatory Visit (HOSPITAL_BASED_OUTPATIENT_CLINIC_OR_DEPARTMENT_OTHER): Payer: 59

## 2015-04-02 ENCOUNTER — Ambulatory Visit (HOSPITAL_BASED_OUTPATIENT_CLINIC_OR_DEPARTMENT_OTHER): Payer: 59 | Admitting: Oncology

## 2015-04-02 ENCOUNTER — Ambulatory Visit (HOSPITAL_BASED_OUTPATIENT_CLINIC_OR_DEPARTMENT_OTHER): Payer: 59

## 2015-04-02 VITALS — BP 121/82 | HR 76 | Temp 99.1°F | Resp 18 | Ht 62.0 in | Wt 130.5 lb

## 2015-04-02 DIAGNOSIS — R180 Malignant ascites: Secondary | ICD-10-CM

## 2015-04-02 DIAGNOSIS — Z95828 Presence of other vascular implants and grafts: Secondary | ICD-10-CM

## 2015-04-02 DIAGNOSIS — G622 Polyneuropathy due to other toxic agents: Secondary | ICD-10-CM

## 2015-04-02 DIAGNOSIS — C561 Malignant neoplasm of right ovary: Secondary | ICD-10-CM | POA: Diagnosis not present

## 2015-04-02 DIAGNOSIS — C50912 Malignant neoplasm of unspecified site of left female breast: Secondary | ICD-10-CM | POA: Diagnosis not present

## 2015-04-02 DIAGNOSIS — Z9889 Other specified postprocedural states: Secondary | ICD-10-CM | POA: Diagnosis not present

## 2015-04-02 DIAGNOSIS — C562 Malignant neoplasm of left ovary: Secondary | ICD-10-CM | POA: Diagnosis not present

## 2015-04-02 DIAGNOSIS — T451X5A Adverse effect of antineoplastic and immunosuppressive drugs, initial encounter: Secondary | ICD-10-CM

## 2015-04-02 DIAGNOSIS — Z1509 Genetic susceptibility to other malignant neoplasm: Principal | ICD-10-CM

## 2015-04-02 DIAGNOSIS — Z23 Encounter for immunization: Secondary | ICD-10-CM

## 2015-04-02 DIAGNOSIS — G620 Drug-induced polyneuropathy: Secondary | ICD-10-CM

## 2015-04-02 DIAGNOSIS — Z1501 Genetic susceptibility to malignant neoplasm of breast: Secondary | ICD-10-CM

## 2015-04-02 DIAGNOSIS — C569 Malignant neoplasm of unspecified ovary: Secondary | ICD-10-CM

## 2015-04-02 LAB — CBC WITH DIFFERENTIAL/PLATELET
BASO%: 1.1 % (ref 0.0–2.0)
BASOS ABS: 0 10*3/uL (ref 0.0–0.1)
EOS ABS: 0 10*3/uL (ref 0.0–0.5)
EOS%: 0.5 % (ref 0.0–7.0)
HEMATOCRIT: 36.4 % (ref 34.8–46.6)
HEMOGLOBIN: 12 g/dL (ref 11.6–15.9)
LYMPH#: 1.2 10*3/uL (ref 0.9–3.3)
LYMPH%: 34.7 % (ref 14.0–49.7)
MCH: 32.1 pg (ref 25.1–34.0)
MCHC: 33 g/dL (ref 31.5–36.0)
MCV: 97.3 fL (ref 79.5–101.0)
MONO#: 0.4 10*3/uL (ref 0.1–0.9)
MONO%: 12.5 % (ref 0.0–14.0)
NEUT#: 1.8 10*3/uL (ref 1.5–6.5)
NEUT%: 51.2 % (ref 38.4–76.8)
Platelets: 321 10*3/uL (ref 145–400)
RBC: 3.74 10*6/uL (ref 3.70–5.45)
RDW: 13.5 % (ref 11.2–14.5)
WBC: 3.5 10*3/uL — ABNORMAL LOW (ref 3.9–10.3)

## 2015-04-02 MED ORDER — INFLUENZA VAC SPLIT QUAD 0.5 ML IM SUSY
0.5000 mL | PREFILLED_SYRINGE | Freq: Once | INTRAMUSCULAR | Status: AC
  Administered 2015-04-02: 0.5 mL via INTRAMUSCULAR
  Filled 2015-04-02: qty 0.5

## 2015-04-02 MED ORDER — HEPARIN SOD (PORK) LOCK FLUSH 100 UNIT/ML IV SOLN
500.0000 [IU] | Freq: Once | INTRAVENOUS | Status: AC
  Administered 2015-04-02: 500 [IU] via INTRAVENOUS
  Filled 2015-04-02: qty 5

## 2015-04-02 MED ORDER — TRAMADOL HCL 50 MG PO TABS: 50.0000 mg | ORAL_TABLET | Freq: Four times a day (QID) | ORAL | Status: DC | PRN

## 2015-04-02 MED ORDER — SODIUM CHLORIDE 0.9 % IJ SOLN
10.0000 mL | INTRAMUSCULAR | Status: DC | PRN
  Administered 2015-04-02: 10 mL via INTRAVENOUS
  Filled 2015-04-02: qty 10

## 2015-04-02 NOTE — Progress Notes (Signed)
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 She had CT CAP 03-12-15 with some findings concerning for recurrent/ progressive disease, and CA 125 elevated to 64 on 03-14-15, this having been down to 6 on day of last chemo 02-01-15. She saw Dr Denman George on 03-14-15, with recommendation for salvage chemo with weekly taxol avastin vs doxil. Prior to today's visit, with doxil being considered, I have been in communication with Dr Tyler Pita, as he did her left breast radiation in [place] early 2006. Dr Tammi Klippel has kindly reviewed available records at [facility], along with other imaging and information from Dr Cinda Quest records. Although all of [facility] records are not available, Dr Tammi Klippel believes she has had some cardiac exposure from that previous radiation. Note her adjuvant breast chemotherapy included epirubicin.  Neuropathy has improved in feet in break off of chemo, now just in toes; she has no symptomatic neuropathy in hands.  She had bilateral mammograms at [facility] 02-13-15 with breast tissue heterogeneously dense but otherwise unchanged. Plan prior to recurrent gyn cancer was for MRI breast within 3 months (by late Oct 2016)  Patient has felt more uncomfortable in last several days, with increasing abdominal distension and decreased appetite. She has had no vomiting and bowels are moving, tho not as well as previously. She denies SOB or chest pain. PAC was flushed with lab draw today. She denies fever, symptoms of infection, LE swelling, any bleeding, any problems with PAC.    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 Symptoms continued, with early satiety causing poor po intake; she was seen again at [facility] ED in early Jan 2016, with CT reportedly consistent with stage IIIC or IV gyn cancer, including omental caking, large pelvic mass, para-aortic adenopathy, ascites, pleural effusions and right subpleural nodule. She was seen in consultation by Dr Denman George on 07-25-14, with concern from her exam and scan for rectal involvement. 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 She had paracentesis of 3.4 liters ascites on 07-27-2014, with cytology ([id number]) showing serous carcinoma consistent with gyn primary, ER negative. She had right thoracentesis for 350 cc on 08-11-14, this requested by anesthesia prior to surgery and apparently not sent for cytology. Patient was more comfortable after paracentesis, but could not tell any difference in breathing after thoracentesis (denies SOB prior to that procedure). CA 125 on 08-02-14 was 1162. She was taken to exploratory laparotomy by Dr Denman George on 08-15-14, with 4 liters of ascites removed and biopsy of omentum obtained, with additional debulking not attempted due to surgical findings of entire peritoneal surface replaced by tumor plaque, small intestine coated with tumor and mesentery tethered with tumor nodules, ileum adherent to pelvic mass, dense plaque of tumor on right diaphragm and 10 cm omental cake in LUQ. Pathology of omental biopsy She needed another paracentesis by IR for 1.3 liters ascites on 08-17-14, and had PAC placed by IR also 08-17-14. Day 1 cycle 1 dose dense carboplatin taxol was given in hospital on  08-18-14. After 2 cycles of dose dense regimen, the carboplatin and taxol was changed to every 3 week regimen beginning cycle 3; she had cycle 5 on 11-09-14, with gCSF  support. She had interval debulking by Dr Denman George on 11-28-14. Surgery was exploratory laparotomy with TAH/ BSO, omentectomy and radical tumor debulking; R1 resection with residual milial tumor on small bowel at completion of procedure. Tumor involved bilateral adnexa, uterine serosa, omentum and falciform ligament, with + LVSI, no nodes examined. Maximum tumor size was 4.3 cm involving left ovary. She resumed carboplatin Taxol on 12-21-14, total of 3 cycles thru 02-01-15 (= 3 cycles after interval debulking). Restaging CT CAP 03-12-15 had some changes concerning for residual or recurrent disease, and CA 125 was elevated to 64 on 03-14-15, having been 6 in July 2016.  Review of prior radiation and chemotherapy (for left breast cancer) significant for use of epirubicin and some unquantified cardiac exposure to radiation  BRCA 2 abnormality identified 10-12-14.  Breast cancer: Left breast biopsy 11-22-2003 ([id number]) invasive carcinoma. Left breast wide excision with sentinel nodes and axillary contents by Dr Benson Norway 01-03-2004 [phone]) poorly differentiated invasive ductal carcinoma 3.0 cm with closest margin 1.7 cm deep, 6 sentinel nodes + 3 additional nodes all negative, ER 0, PR 0, HER 2 3+, Ki67 of 19%. . She received adjuvant chemotherapy by Dr Sonny Dandy with 5FU, epirubicin, cytoxan x 4 cycles from 02-13-2004 thru 04-16-2004, followed by taxol 80 mg/m2 weekly x 11 from 05-07-2004 thru 09-04-2004, and herceptin 05-21-2004 thru 02-25-2005. Antiemetics were Kytril and decadron. She had PAC for the chemotherapy. She had radiation by Dr Tyler Pita in [place]. Last note from Dr Sonny Dandy in this information was 04-2008. Left diagnostic mammogram [place] 10-25-2014 stable compared with 12-06-13 and priors; due bilateral mammograms again late May 2016.    Review of systems as above, also: Is voiding. No new or different pain. She had been asymptomatic at time of Dr Serita Grit visit late Aug, including good appetite and good  po intake. Remainder of 10 point Review of Systems negative.  Objective:  Vital signs in last 24 hours:  BP 121/82 mmHg  Pulse 76  Temp(Src) 99.1 F (37.3 C) (Oral)  Resp 18  Ht 5' 2"  (1.575 m)  Wt 130 lb 8 oz (59.194 kg)  BMI 23.86 kg/m2  SpO2 99% Weight up 6 lbs from early July. Looks mildly uncomfortable but NAD. Respirations not labored RA Alert, oriented and appropriate. Ambulatory without difficulty.   HEENT:PERRL, sclerae not icteric. Oral mucosa moist without lesions, posterior pharynx clear.  Neck supple. No JVD.  Lymphatics:no cervical,supraclavicular, axillary or inguinal adenopathy Resp: clear to auscultation bilaterally and normal percussion bilaterally Cardio: regular rate and rhythm. No gallop. GI: soft, nontender, somewhat distended but not tight, apparent fluid wave, cannot appreciate mass or organomegaly. Scattered bowel sounds. Surgical incision not remarkable. Musculoskeletal/ Extremities: without pitting edema, cords, tenderness. Back not tender. No UE swelling Neuro:  peripheral neuropathy toes bilateraly. Otherwise nonfocal. PSYCH appropriate mood and affect Skin acneiform lesions central face otherwise without rash, ecchymosis, petechiae Breasts: Left breast with thickening upper outer quadrant as previously, otherwise bilaterally without dominant mass, skin or nipple findings. Axillae benign. Portacath-without erythema or tenderness  Lab Results:  Results for orders placed or performed in visit on 04/02/15  CBC with Differential  Result Value Ref Range   WBC 3.5 (L) 3.9 - 10.3 10e3/uL   NEUT# 1.8 1.5 - 6.5 10e3/uL   HGB 12.0 11.6 - 15.9 g/dL   HCT 36.4  34.8 - 46.6 %   Platelets 321 145 - 400 10e3/uL   MCV 97.3 79.5 - 101.0 fL   MCH 32.1 25.1 - 34.0 pg   MCHC 33.0 31.5 - 36.0 g/dL   RBC 3.74 3.70 - 5.45 10e6/uL   RDW 13.5 11.2 - 14.5 %   lymph# 1.2 0.9 - 3.3 10e3/uL   MONO# 0.4 0.1 - 0.9 10e3/uL   Eosinophils Absolute 0.0 0.0 - 0.5 10e3/uL    Basophils Absolute 0.0 0.0 - 0.1 10e3/uL   NEUT% 51.2 38.4 - 76.8 %   LYMPH% 34.7 14.0 - 49.7 %   MONO% 12.5 0.0 - 14.0 %   EOS% 0.5 0.0 - 7.0 %   BASO% 1.1 0.0 - 2.0 %   CMET hemolyzed Will repeat CMET and CA 125 with next blood draw  Studies/Results:  No results found.  Medications: I have reviewed the patient's current medications. Flu vaccine given today  DISCUSSION: Unfortunately rapid progression of high grade ovarian cancer just after completion of carboplatin taxol. I have told patient that treatment now is in attempt to control disease, but will not be able to eradicate the disease.  Although doxil could be considered despite prior treatment, I would prefer to use other options first. She has had taxol both with adjuvant breast ca and with the gyn ca, with significant peripheral neuropathy in July which fortunately is improving now. We have discussed gemcitabine, topotecan, avastin; could also consider oral etoposide or Alimta. With rest of situation including BRCA +, will try gemzar day 1 day 8 q 21 days now.  We may adjust gemzar schedule to every other week depending on response. We could add avastin to gemzar if needed.  As patient lives in [place], I have offered to help her move medical oncology care either to [facility] oncology in [place] or to Dr Ancil Linsey at [facility]; at present she prefers to stay in [place]. If she needs gCSF, we will try to have that done at [facility]. We have requested paracentesis therapeutic and diagnostic this week by IR.  Patient has had teaching for gemcitabine by RN now. MD has reviewed information including need for administration thru central line, possible fever/ aches/ fatigue as frequent side effects of gemzar. Patient gives verbal consent for treatment.  Flu vaccine done now    Assessment/Plan:  1. High grade papillary serous left ovarian carcinoma IIIC: post interval R1 resection on 11-28-14 after 5 cycles of neoadjuvant  chemotherapy, additional 3 cycles of carbo taxol from 12-21-14 thru  02-01-15 , with neulasta support. Rapid recurrence/ progression of gyn cancer within 4 weeks of completing that chemo. Paracentesis this week, begin gemzar for reasons as above 2.taxol peripheral neuropathy: some improvement off of taxane for past 2 months. 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 Bilateral mammograms at [facility] done 02-13-15.  With BRCA 2 mutation, she can be followed with breast MRIs within 3 months of mammograms, if otherwise stable for that evaluation. 5.PAC in 6.nutritional status needs to be watched closely, hopefully po intake will improve after paracentesis 7.RBBB and LAFB, HTN, known to Dr Acie Fredrickson. Previous epirubicin, some cardiac exposure from left breast radiation 8.chemo anemia: Continuing oral iron as hemocyte 9.hypothyroidism on replacement 10.post parathyroidectomy 2005 11.rash central face: persists off steroids for chemo, likely rosacea 12.Flu vaccine given now  Gemcitabine orders placed and financial  staff notified for preauthorization. Will begin 9-19 or sooner if possible depending on preauth and infusion availability. All questions answered and patient is in agreement with plans, knows that she can call at any time if needed prior to scheduled appointment. Time spent 40 min including >50% counseling and coordination of care Cc Drs Derek Jack, Doug Sou, MD   04/02/2015, 8:56 AM

## 2015-04-02 NOTE — Telephone Encounter (Signed)
Gave avs & calendar for August °

## 2015-04-02 NOTE — Patient Instructions (Signed)

## 2015-04-06 ENCOUNTER — Ambulatory Visit (HOSPITAL_COMMUNITY)
Admission: RE | Admit: 2015-04-06 | Discharge: 2015-04-06 | Disposition: A | Discharge status: Home / Self Care | Payer: 59 | Source: Ambulatory Visit | Attending: Oncology | Admitting: Oncology

## 2015-04-06 DIAGNOSIS — R188 Other ascites: Secondary | ICD-10-CM | POA: Diagnosis present

## 2015-04-06 DIAGNOSIS — C569 Malignant neoplasm of unspecified ovary: Secondary | ICD-10-CM | POA: Insufficient documentation

## 2015-04-06 DIAGNOSIS — C562 Malignant neoplasm of left ovary: Secondary | ICD-10-CM

## 2015-04-06 NOTE — Procedures (Signed)
US guided diagnostic/therapeutic paracentesis performed yielding 1.8 liters yellow fluid. A portion of the fluid was sent to the lab for cytology. No immediate complications.

## 2015-04-08 ENCOUNTER — Other Ambulatory Visit: Payer: Self-pay | Admitting: Oncology

## 2015-04-09 ENCOUNTER — Other Ambulatory Visit (HOSPITAL_BASED_OUTPATIENT_CLINIC_OR_DEPARTMENT_OTHER): Payer: 59

## 2015-04-09 ENCOUNTER — Ambulatory Visit: Payer: 59

## 2015-04-09 ENCOUNTER — Ambulatory Visit (HOSPITAL_BASED_OUTPATIENT_CLINIC_OR_DEPARTMENT_OTHER): Payer: 59

## 2015-04-09 VITALS — BP 118/73 | HR 84 | Temp 98.9°F | Resp 18

## 2015-04-09 DIAGNOSIS — C569 Malignant neoplasm of unspecified ovary: Secondary | ICD-10-CM

## 2015-04-09 DIAGNOSIS — Z1501 Genetic susceptibility to malignant neoplasm of breast: Principal | ICD-10-CM

## 2015-04-09 DIAGNOSIS — C562 Malignant neoplasm of left ovary: Secondary | ICD-10-CM

## 2015-04-09 DIAGNOSIS — Z5111 Encounter for antineoplastic chemotherapy: Secondary | ICD-10-CM | POA: Diagnosis not present

## 2015-04-09 DIAGNOSIS — C561 Malignant neoplasm of right ovary: Secondary | ICD-10-CM

## 2015-04-09 DIAGNOSIS — Z452 Encounter for adjustment and management of vascular access device: Secondary | ICD-10-CM

## 2015-04-09 DIAGNOSIS — Z1509 Genetic susceptibility to other malignant neoplasm: Secondary | ICD-10-CM

## 2015-04-09 LAB — CBC WITH DIFFERENTIAL/PLATELET
BASO%: 0.9 % (ref 0.0–2.0)
BASOS ABS: 0 10*3/uL (ref 0.0–0.1)
EOS ABS: 0 10*3/uL (ref 0.0–0.5)
EOS%: 0.5 % (ref 0.0–7.0)
HEMATOCRIT: 32.6 % — AB (ref 34.8–46.6)
HEMOGLOBIN: 11 g/dL — AB (ref 11.6–15.9)
LYMPH#: 1.2 10*3/uL (ref 0.9–3.3)
LYMPH%: 31.2 % (ref 14.0–49.7)
MCH: 32.1 pg (ref 25.1–34.0)
MCHC: 33.8 g/dL (ref 31.5–36.0)
MCV: 95 fL (ref 79.5–101.0)
MONO#: 0.5 10*3/uL (ref 0.1–0.9)
MONO%: 13.8 % (ref 0.0–14.0)
NEUT#: 2.1 10*3/uL (ref 1.5–6.5)
NEUT%: 53.6 % (ref 38.4–76.8)
Platelets: 279 10*3/uL (ref 145–400)
RBC: 3.43 10*6/uL — ABNORMAL LOW (ref 3.70–5.45)
RDW: 13.1 % (ref 11.2–14.5)
WBC: 3.9 10*3/uL (ref 3.9–10.3)

## 2015-04-09 LAB — COMPREHENSIVE METABOLIC PANEL (CC13)
ALBUMIN: 3.4 g/dL — AB (ref 3.5–5.0)
ALK PHOS: 65 U/L (ref 40–150)
ALT: 15 U/L (ref 0–55)
AST: 17 U/L (ref 5–34)
Anion Gap: 7 mEq/L (ref 3–11)
BUN: 14.3 mg/dL (ref 7.0–26.0)
CALCIUM: 8.9 mg/dL (ref 8.4–10.4)
CO2: 27 mEq/L (ref 22–29)
Chloride: 103 mEq/L (ref 98–109)
Creatinine: 0.7 mg/dL (ref 0.6–1.1)
Glucose: 106 mg/dl (ref 70–140)
POTASSIUM: 4 meq/L (ref 3.5–5.1)
Sodium: 137 mEq/L (ref 136–145)
Total Bilirubin: 0.32 mg/dL (ref 0.20–1.20)
Total Protein: 6.8 g/dL (ref 6.4–8.3)

## 2015-04-09 MED ORDER — ACETAMINOPHEN 325 MG PO TABS
ORAL_TABLET | ORAL | Status: AC
  Filled 2015-04-09: qty 1

## 2015-04-09 MED ORDER — PROCHLORPERAZINE MALEATE 10 MG PO TABS
10.0000 mg | ORAL_TABLET | Freq: Once | ORAL | Status: AC
  Administered 2015-04-09: 10 mg via ORAL

## 2015-04-09 MED ORDER — SODIUM CHLORIDE 0.9 % IV SOLN
Freq: Once | INTRAVENOUS | Status: AC
  Administered 2015-04-09: 11:00:00 via INTRAVENOUS

## 2015-04-09 MED ORDER — HEPARIN SOD (PORK) LOCK FLUSH 100 UNIT/ML IV SOLN
500.0000 [IU] | Freq: Once | INTRAVENOUS | Status: AC | PRN
  Administered 2015-04-09: 500 [IU]
  Filled 2015-04-09: qty 5

## 2015-04-09 MED ORDER — ALTEPLASE 2 MG IJ SOLR
2.0000 mg | Freq: Once | INTRAMUSCULAR | Status: AC | PRN
  Administered 2015-04-09: 2 mg
  Filled 2015-04-09: qty 2

## 2015-04-09 MED ORDER — SODIUM CHLORIDE 0.9 % IJ SOLN
10.0000 mL | INTRAMUSCULAR | Status: DC | PRN
  Administered 2015-04-09: 10 mL via INTRAVENOUS
  Filled 2015-04-09: qty 10

## 2015-04-09 MED ORDER — SODIUM CHLORIDE 0.9 % IJ SOLN
10.0000 mL | INTRAMUSCULAR | Status: DC | PRN
  Administered 2015-04-09: 10 mL
  Filled 2015-04-09: qty 10

## 2015-04-09 MED ORDER — ACETAMINOPHEN 325 MG PO TABS
325.0000 mg | ORAL_TABLET | Freq: Once | ORAL | Status: AC
  Administered 2015-04-09: 325 mg via ORAL

## 2015-04-09 MED ORDER — GEMCITABINE HCL CHEMO INJECTION 1 GM/26.3ML
800.0000 mg/m2 | Freq: Once | INTRAVENOUS | Status: AC
  Administered 2015-04-09: 1292 mg via INTRAVENOUS
  Filled 2015-04-09: qty 33.98

## 2015-04-09 MED ORDER — PROCHLORPERAZINE MALEATE 10 MG PO TABS
ORAL_TABLET | ORAL | Status: AC
  Filled 2015-04-09: qty 1

## 2015-04-09 NOTE — Patient Instructions (Signed)
Cancer Center Discharge Instructions for Patients Receiving Chemotherapy  Today you received the following chemotherapy agents:  Gemzar  To help prevent nausea and vomiting after your treatment, we encourage you to take your nausea medication as ordered per MD.   If you develop nausea and vomiting that is not controlled by your nausea medication, call the clinic.   BELOW ARE SYMPTOMS THAT SHOULD BE REPORTED IMMEDIATELY:  *FEVER GREATER THAN 100.5 F  *CHILLS WITH OR WITHOUT FEVER  NAUSEA AND VOMITING THAT IS NOT CONTROLLED WITH YOUR NAUSEA MEDICATION  *UNUSUAL SHORTNESS OF BREATH  *UNUSUAL BRUISING OR BLEEDING  TENDERNESS IN MOUTH AND THROAT WITH OR WITHOUT PRESENCE OF ULCERS  *URINARY PROBLEMS  *BOWEL PROBLEMS  UNUSUAL RASH Items with * indicate a potential emergency and should be followed up as soon as possible.  Feel free to call the clinic you have any questions or concerns. The clinic phone number is [phone].  Please show the CHEMO ALERT CARD at check-in to the Emergency Department and triage nurse.   

## 2015-04-09 NOTE — Progress Notes (Signed)
1035-Positive blood return from port.  Labs drawn via port without difficulty.

## 2015-04-10 ENCOUNTER — Telehealth: Payer: Self-pay

## 2015-04-10 LAB — CA 125: CA 125: 313 U/mL — ABNORMAL HIGH (ref <35)

## 2015-04-10 NOTE — Telephone Encounter (Signed)
-----   Message from Gordy Levan, MD sent at 04/08/2015  3:20 PM EDT ----- I believe we can given gCSF at [facility] on patients being treated in [place]. I am not sure if she will need it, but could you please confirm with that office and see what we have to do if we do need that  thanks

## 2015-04-10 NOTE — Telephone Encounter (Signed)
-----   Message from San Morelle, RN sent at 04/09/2015  3:14 PM EDT ----- Regarding: livesay-chemo f/u call Contact: [phone] 1st time Gemzar.  Pt tolerated well with no difficulties or complaints.

## 2015-04-10 NOTE — Telephone Encounter (Signed)
Angie at [facility] stated that the process to have neulasta given at their facility would be for Dr. Marko Plume to call or in-basket Dr. Whitney Muse  to get her approval.    This information will be forwarded to Dr. Marko Plume.

## 2015-04-10 NOTE — Telephone Encounter (Signed)
Ms. Kathleen Bond states that she is doing well.  She has had no n/v.  She is drinking and eating well.  She just had a tightness in her abdomen last night but it is gone this morning. No flu like symptoms.  Suggested she take a tylenol today to help prevent these symptoms.  Ms. Kathleen Bond verbalized understanding and knows to call [phone] if any questions or concerns.

## 2015-04-15 ENCOUNTER — Other Ambulatory Visit: Payer: Self-pay | Admitting: Oncology

## 2015-04-16 ENCOUNTER — Ambulatory Visit (HOSPITAL_BASED_OUTPATIENT_CLINIC_OR_DEPARTMENT_OTHER): Payer: 59 | Admitting: Oncology

## 2015-04-16 ENCOUNTER — Other Ambulatory Visit (HOSPITAL_BASED_OUTPATIENT_CLINIC_OR_DEPARTMENT_OTHER): Payer: 59

## 2015-04-16 ENCOUNTER — Ambulatory Visit: Payer: 59

## 2015-04-16 ENCOUNTER — Telehealth: Payer: Self-pay | Admitting: Oncology

## 2015-04-16 ENCOUNTER — Encounter: Payer: Self-pay | Admitting: Oncology

## 2015-04-16 VITALS — BP 137/87 | HR 85 | Temp 98.8°F | Resp 18 | Ht 62.0 in | Wt 128.5 lb

## 2015-04-16 DIAGNOSIS — R180 Malignant ascites: Secondary | ICD-10-CM

## 2015-04-16 DIAGNOSIS — C562 Malignant neoplasm of left ovary: Secondary | ICD-10-CM | POA: Diagnosis not present

## 2015-04-16 DIAGNOSIS — T451X5A Adverse effect of antineoplastic and immunosuppressive drugs, initial encounter: Secondary | ICD-10-CM

## 2015-04-16 DIAGNOSIS — Z5189 Encounter for other specified aftercare: Secondary | ICD-10-CM | POA: Diagnosis not present

## 2015-04-16 DIAGNOSIS — C561 Malignant neoplasm of right ovary: Secondary | ICD-10-CM

## 2015-04-16 DIAGNOSIS — Z1509 Genetic susceptibility to other malignant neoplasm: Principal | ICD-10-CM

## 2015-04-16 DIAGNOSIS — G620 Drug-induced polyneuropathy: Secondary | ICD-10-CM

## 2015-04-16 DIAGNOSIS — Z9889 Other specified postprocedural states: Secondary | ICD-10-CM | POA: Diagnosis not present

## 2015-04-16 DIAGNOSIS — G622 Polyneuropathy due to other toxic agents: Secondary | ICD-10-CM

## 2015-04-16 DIAGNOSIS — Z1501 Genetic susceptibility to malignant neoplasm of breast: Principal | ICD-10-CM

## 2015-04-16 DIAGNOSIS — Z95828 Presence of other vascular implants and grafts: Secondary | ICD-10-CM

## 2015-04-16 DIAGNOSIS — Z853 Personal history of malignant neoplasm of breast: Secondary | ICD-10-CM

## 2015-04-16 DIAGNOSIS — D701 Agranulocytosis secondary to cancer chemotherapy: Secondary | ICD-10-CM

## 2015-04-16 LAB — COMPREHENSIVE METABOLIC PANEL (CC13)
ALBUMIN: 3.3 g/dL — AB (ref 3.5–5.0)
ALT: 12 U/L (ref 0–55)
AST: 12 U/L (ref 5–34)
Alkaline Phosphatase: 69 U/L (ref 40–150)
Anion Gap: 10 mEq/L (ref 3–11)
BUN: 9 mg/dL (ref 7.0–26.0)
CO2: 30 meq/L — AB (ref 22–29)
CREATININE: 0.7 mg/dL (ref 0.6–1.1)
Calcium: 9.9 mg/dL (ref 8.4–10.4)
Chloride: 102 mEq/L (ref 98–109)
EGFR: >90 mL/min/{1.73_m2} (ref >90)
GLUCOSE: 90 mg/dL (ref 70–140)
Potassium: 3.9 mEq/L (ref 3.5–5.1)
SODIUM: 141 meq/L (ref 136–145)
TOTAL PROTEIN: 7.2 g/dL (ref 6.4–8.3)

## 2015-04-16 LAB — CBC WITH DIFFERENTIAL/PLATELET
BASO%: 0.4 % (ref 0.0–2.0)
Basophils Absolute: 0 10*3/uL (ref 0.0–0.1)
EOS%: 0.5 % (ref 0.0–7.0)
Eosinophils Absolute: 0 10*3/uL (ref 0.0–0.5)
HCT: 32.3 % — ABNORMAL LOW (ref 34.8–46.6)
HEMOGLOBIN: 10.7 g/dL — AB (ref 11.6–15.9)
LYMPH%: 49 % (ref 14.0–49.7)
MCH: 31.5 pg (ref 25.1–34.0)
MCHC: 33.2 g/dL (ref 31.5–36.0)
MCV: 94.9 fL (ref 79.5–101.0)
MONO#: 0.4 10*3/uL (ref 0.1–0.9)
MONO%: 16.4 % — AB (ref 0.0–14.0)
NEUT%: 33.7 % — ABNORMAL LOW (ref 38.4–76.8)
NEUTROS ABS: 0.8 10*3/uL — AB (ref 1.5–6.5)
Platelets: 200 10*3/uL (ref 145–400)
RBC: 3.4 10*6/uL — AB (ref 3.70–5.45)
RDW: 12.5 % (ref 11.2–14.5)
WBC: 2.3 10*3/uL — AB (ref 3.9–10.3)
lymph#: 1.1 10*3/uL (ref 0.9–3.3)

## 2015-04-16 MED ORDER — TBO-FILGRASTIM 300 MCG/0.5ML ~~LOC~~ SOSY
300.0000 ug | PREFILLED_SYRINGE | Freq: Once | SUBCUTANEOUS | Status: AC
Start: 2015-04-16 — End: 2015-04-16
  Administered 2015-04-16: 300 ug via SUBCUTANEOUS
  Filled 2015-04-16: qty 0.5

## 2015-04-16 NOTE — Telephone Encounter (Signed)
Gave avs & calendar for September/October. °

## 2015-04-16 NOTE — Progress Notes (Signed)
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 She is due day 8 cycle 1 gemzar today, however is neutropenic. She had paracentesis for 1.8 liters ascites on 04-06-15, cytology malignant high grade serous carcinoma  ([id number]).  Patient was much more comfortable after paracentesis and cannot tell that the ascites is reaccumulating significantly as yet. She had no severe problems with day 1 cycle 1 gemzar on 04-09-15, including no fever or flu like symptoms afterwards, but was very fatigued until just last 1-2 days. She was still able to do necessary home activities, but otherwise was mostly resting. She had no significant nausea, used antiemetics only ~ once. Bowels have moved with prn laxative. Appetite better just in last 1-2 days tho still early satiety. She denies SOB or pain. She has had no symptoms of infection and no fever in last 24 hours.    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 Symptoms continued, with early satiety causing poor po intake; she was seen again at [facility] ED in early Jan 2016, with CT reportedly consistent with stage IIIC or IV gyn cancer, including omental caking, large pelvic mass, para-aortic adenopathy, ascites, pleural effusions and right subpleural nodule. She was seen in consultation by Dr Denman George on 07-25-14, with concern from her exam and scan for rectal  involvement. 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 She had paracentesis of 3.4 liters ascites on 07-27-2014, with cytology ([id number]) showing serous carcinoma consistent with gyn primary, ER negative. She had right thoracentesis for 350 cc on 08-11-14, this requested by anesthesia prior to surgery and apparently not sent for cytology. Patient was more comfortable after paracentesis, but could not tell any difference in breathing after thoracentesis (denies SOB prior to that procedure). CA 125 on 08-02-14 was 1162. She was taken to exploratory laparotomy by Dr Denman George on 08-15-14, with 4 liters of ascites removed and biopsy of omentum obtained, with additional debulking not attempted due to surgical findings of entire peritoneal surface replaced by tumor plaque, small intestine coated with tumor and mesentery tethered with tumor nodules, ileum adherent to pelvic mass, dense plaque of tumor on right diaphragm and 10 cm omental cake in LUQ. Pathology of omental biopsy She needed another paracentesis by IR for 1.3 liters ascites on 08-17-14, and had PAC placed by IR also 08-17-14. Day 1 cycle 1 dose dense carboplatin taxol was given in hospital on 08-18-14. After 2 cycles of dose dense regimen, the carboplatin and taxol was changed to every 3 week regimen beginning cycle 3; she had cycle 5 on 11-09-14, with gCSF support. She had interval debulking by Dr Denman George on 11-28-14. Surgery was exploratory laparotomy with TAH/ BSO, omentectomy and radical tumor debulking; R1 resection with residual milial tumor on small bowel at completion of procedure. Tumor involved bilateral adnexa, uterine serosa, omentum and falciform ligament, with + LVSI, no nodes examined. Maximum tumor size was  4.3 cm involving left ovary. She resumed carboplatin Taxol on 12-21-14, total of 3 cycles thru 02-01-15 (= 3 cycles after interval debulking). Restaging CT CAP 03-12-15 had some changes  concerning for residual or recurrent disease, and CA 125 was elevated to 64 on 03-14-15, having been 6 in July 2016.  Review of prior radiation and chemotherapy (for left breast cancer) significant for use of epirubicin and some unquantified cardiac exposure to radiation. She began gemcitabine 04-09-15. She was neutropenic by 04-16-15.   BRCA 2 abnormality identified 10-12-14.  Breast cancer: Left breast biopsy 11-22-2003 ([id number]) invasive carcinoma. Left breast wide excision with sentinel nodes and axillary contents by Dr Benson Norway 01-03-2004 [phone]) poorly differentiated invasive ductal carcinoma 3.0 cm with closest margin 1.7 cm deep, 6 sentinel nodes + 3 additional nodes all negative, ER 0, PR 0, HER 2 3+, Ki67 of 19%. . She received adjuvant chemotherapy by Dr Sonny Dandy with 5FU, epirubicin, cytoxan x 4 cycles from 02-13-2004 thru 04-16-2004, followed by taxol 80 mg/m2 weekly x 11 from 05-07-2004 thru 09-04-2004, and herceptin 05-21-2004 thru 02-25-2005. Antiemetics were Kytril and decadron. She had PAC for the chemotherapy. She had radiation by Dr Tyler Pita in [place]. Last note from Dr Sonny Dandy in this information was 04-2008. Left diagnostic mammogram [facility] 10-25-2014 stable compared with 12-06-13 and priors; due bilateral mammograms again late May 2016.    Review of systems as above, also: No problems with PAC. No LE swelling. No bleeding. Some mild fullness or tightness upper abdomen. No change peripheral neuropathy from prior taxane. No changes in breasts.  Remainder of 10 point Review of Systems negative.  Objective:  Vital signs in last 24 hours:  BP 137/87 mmHg  Pulse 85  Temp(Src) 98.8 F (37.1 C) (Oral)  Resp 18  Ht 5' 2"  (1.575 m)  Wt 128 lb 8 oz (58.287 kg)  BMI 23.50 kg/m2  SpO2 98% Weight is down 2 lbs from my last visit Alert, oriented and appropriate, looks more comfortable than prior to paracentesis. Ambulatory without difficulty.  No alopecia  HEENT:PERRL, sclerae  not icteric. Oral mucosa moist without lesions, posterior pharynx clear.  Neck supple. No JVD.  Lymphatics:no cervical,supraclavicular, axillary or inguinal adenopathy Resp: clear to auscultation bilaterally and normal percussion bilaterally Cardio: regular rate and rhythm. No gallop. GI: soft, nontender,less distended than prior to paracentesis, no clear mass or organomegaly. Normally active bowel sounds. Surgical incision not remarkable. Musculoskeletal/ Extremities: without pitting edema, cords, tenderness Neuro: no change peripheral neuropathy. Otherwise nonfocal. Psych appropriate mood and affect Skin without rash, ecchymosis, petechiae Breasts: right without dominant mass, skin or nipple findings. Left with stable thickening 11:00-1:00 Axillae benign. Portacath-without erythema or tenderness  Lab Results:  Results for orders placed or performed in visit on 04/16/15  CBC with Differential  Result Value Ref Range   WBC 2.3 (L) 3.9 - 10.3 10e3/uL   NEUT# 0.8 (L) 1.5 - 6.5 10e3/uL   HGB 10.7 (L) 11.6 - 15.9 g/dL   HCT 32.3 (L) 34.8 - 46.6 %   Platelets 200 145 - 400 10e3/uL   MCV 94.9 79.5 - 101.0 fL   MCH 31.5 25.1 - 34.0 pg   MCHC 33.2 31.5 - 36.0 g/dL   RBC 3.40 (L) 3.70 - 5.45 10e6/uL   RDW 12.5 11.2 - 14.5 %   lymph# 1.1 0.9 - 3.3 10e3/uL   MONO# 0.4 0.1 - 0.9 10e3/uL   Eosinophils Absolute 0.0 0.0 - 0.5 10e3/uL   Basophils Absolute 0.0 0.0 - 0.1 10e3/uL  NEUT% 33.7 (L) 38.4 - 76.8 %   LYMPH% 49.0 14.0 - 49.7 %   MONO% 16.4 (H) 0.0 - 14.0 %   EOS% 0.5 0.0 - 7.0 %   BASO% 0.4 0.0 - 2.0 %   CMET available after visit normal with exception of alb 3.3  CA125 on 04-09-15   313, this having been 64 on 03-14-15  Studies/Results:  CYTOLOGY  FINAL for SHANIQUIA, BRAFFORD ([id number]) Patient: SUMAYA, RIEDESEL Collected: 04/06/2015 Client: [facility] Accession: [id number] Received: 04/06/2015 Rowe Robert, PA-C  Diagnosis PERITONEAL/ASCITIC FLUID (SPECIMEN 1 OF 1  COLLECTED 04/06/15): MALIGNANT CELLS CONSISTENT WITH HIGH GRADE SEROUS CARCINOMA.  Comment There are malignant clusters in the peritoneal / ascitic fluid. The cells are compared to the concomitant case ([id number]). The cytologic features of the cells in the peritoneal / ascitic fluid are morphologically identical to the tumor seen in the concomitant surgical case.  Medications: I have reviewed the patient's current medications. She is given granix 300 today and will have this again at [facility] on 04-18-15. Take claritin starting today for possible granix aches I have confirmed with managed care that granix is covered by her insurance.   DISCUSSION: Clinically looks better today even with neutropenia. Neutropenic precautions reviewed; she will get thermometer and understands to call if temperature >=100.5, chills or other symptoms of infection. I have spoken with Dr Whitney Muse now, ok for granix at [facility] 9-28; RN to check back later today to be sure they can access my order in EPIC.  Have told patient that we will need to adjust gemzar regimen to every other week chemo with gCSF. She would like to try On Pro neulasta for subsequent treatments, and I will request this from managed care after present granix completed.  She understands that she should let us know if she needs US paracentesis before scheduled MD visits.  Forms for her work updated, requested out for 12 months.  Assessment/Plan: 1. 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 Rapid recurrence/ progression of gyn cancer within 4 weeks of completing that chemo. Paracentesis 04-06-15  helpful for symptoms and confirmed serous malignancy. Fatigue with first gemzar 04-09-15 and neutropenic day 8 cycle 1. Add granix now, no chemo today, granix at  [facility] on 9[facility]. Change to every other week gemzar from here. I will see her with gemzar 04-23-15 if counts adequate, with neulasta by on pro injector then if possible. 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 Bilateral mammograms at [facility] done 02-13-15. With BRCA 2 mutation, she can be followed with breast MRIs within 3 months of mammograms, if otherwise stable for that evaluation. I will schedule breast MRI for Oct, as this would still be appropriate if she responds to treatment to control recurrent gyn ca now 5.PAC in 6.nutritional status needs to be watched closely, hopefully po intake will improve after paracentesis 7.RBBB and LAFB, HTN, known to Dr Acie Fredrickson. 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 Time spent 30 min including >50% counseling and coordination of care. Cc Dr Rocco Pauls, MD   04/16/2015, 10:49 AM

## 2015-04-16 NOTE — Patient Instructions (Signed)
While blood counts are low, stay out of stores or crowds. Wear mask when you go in to [facility] for 3:30 granix injection on 04-17-15 Call if temperature >=100.5, or if shaking chills, or if symptoms of infection   [phone]

## 2015-04-17 ENCOUNTER — Telehealth: Payer: Self-pay | Admitting: Oncology

## 2015-04-17 ENCOUNTER — Other Ambulatory Visit: Payer: Self-pay | Admitting: Oncology

## 2015-04-17 ENCOUNTER — Encounter (HOSPITAL_COMMUNITY): Payer: 59 | Attending: Hematology & Oncology

## 2015-04-17 VITALS — BP 132/73 | HR 103 | Temp 98.3°F | Resp 18

## 2015-04-17 DIAGNOSIS — D701 Agranulocytosis secondary to cancer chemotherapy: Secondary | ICD-10-CM | POA: Insufficient documentation

## 2015-04-17 DIAGNOSIS — Z5189 Encounter for other specified aftercare: Secondary | ICD-10-CM

## 2015-04-17 DIAGNOSIS — Z1509 Genetic susceptibility to other malignant neoplasm: Principal | ICD-10-CM

## 2015-04-17 DIAGNOSIS — G620 Drug-induced polyneuropathy: Secondary | ICD-10-CM | POA: Insufficient documentation

## 2015-04-17 DIAGNOSIS — C561 Malignant neoplasm of right ovary: Secondary | ICD-10-CM

## 2015-04-17 DIAGNOSIS — Z1501 Genetic susceptibility to malignant neoplasm of breast: Secondary | ICD-10-CM

## 2015-04-17 DIAGNOSIS — C50912 Malignant neoplasm of unspecified site of left female breast: Secondary | ICD-10-CM

## 2015-04-17 DIAGNOSIS — T451X5A Adverse effect of antineoplastic and immunosuppressive drugs, initial encounter: Secondary | ICD-10-CM

## 2015-04-17 DIAGNOSIS — C562 Malignant neoplasm of left ovary: Secondary | ICD-10-CM

## 2015-04-17 MED ORDER — TBO-FILGRASTIM 300 MCG/0.5ML ~~LOC~~ SOSY
300.0000 ug | PREFILLED_SYRINGE | Freq: Once | SUBCUTANEOUS | Status: AC
  Administered 2015-04-17: 300 ug via SUBCUTANEOUS
  Filled 2015-04-17: qty 0.5

## 2015-04-17 NOTE — Telephone Encounter (Signed)
Tried to call patient and she has no voicemail set up,will speak with patient 10/3 at her next appointment

## 2015-04-17 NOTE — Progress Notes (Signed)
Kathleen Bond presents today for injection per MD orders. Granix 367mcg administered SQ in left Abdomen. Administration without incident. Patient tolerated well.

## 2015-04-18 ENCOUNTER — Other Ambulatory Visit: Payer: Self-pay | Admitting: Oncology

## 2015-04-18 DIAGNOSIS — C562 Malignant neoplasm of left ovary: Secondary | ICD-10-CM

## 2015-04-18 MED ORDER — PEGFILGRASTIM 6 MG/0.6ML ~~LOC~~ PSKT: 6.0000 mg | PREFILLED_SYRINGE | Freq: Once | SUBCUTANEOUS | Status: DC

## 2015-04-19 ENCOUNTER — Telehealth: Payer: Self-pay | Admitting: *Deleted

## 2015-04-19 ENCOUNTER — Encounter: Payer: Self-pay | Admitting: Oncology

## 2015-04-19 NOTE — Telephone Encounter (Signed)
Disability form faxed to [employer]

## 2015-04-19 NOTE — Progress Notes (Unsigned)
Medical Oncology  Neulasta On Pro approved per E.Smith.  Godfrey Pick, MD

## 2015-04-23 ENCOUNTER — Ambulatory Visit: Payer: 59 | Admitting: Oncology

## 2015-04-23 ENCOUNTER — Encounter: Payer: Self-pay | Admitting: Oncology

## 2015-04-23 ENCOUNTER — Other Ambulatory Visit: Payer: 59

## 2015-04-23 ENCOUNTER — Other Ambulatory Visit (HOSPITAL_BASED_OUTPATIENT_CLINIC_OR_DEPARTMENT_OTHER): Payer: 59

## 2015-04-23 ENCOUNTER — Ambulatory Visit (HOSPITAL_BASED_OUTPATIENT_CLINIC_OR_DEPARTMENT_OTHER): Payer: 59 | Admitting: Oncology

## 2015-04-23 ENCOUNTER — Ambulatory Visit (HOSPITAL_BASED_OUTPATIENT_CLINIC_OR_DEPARTMENT_OTHER): Payer: 59

## 2015-04-23 ENCOUNTER — Telehealth: Payer: Self-pay | Admitting: Oncology

## 2015-04-23 VITALS — BP 116/74 | HR 80 | Temp 98.7°F | Resp 18 | Ht 62.0 in | Wt 128.5 lb

## 2015-04-23 DIAGNOSIS — E039 Hypothyroidism, unspecified: Secondary | ICD-10-CM | POA: Diagnosis not present

## 2015-04-23 DIAGNOSIS — Z5189 Encounter for other specified aftercare: Secondary | ICD-10-CM

## 2015-04-23 DIAGNOSIS — D701 Agranulocytosis secondary to cancer chemotherapy: Secondary | ICD-10-CM | POA: Diagnosis not present

## 2015-04-23 DIAGNOSIS — C562 Malignant neoplasm of left ovary: Secondary | ICD-10-CM

## 2015-04-23 DIAGNOSIS — C50912 Malignant neoplasm of unspecified site of left female breast: Secondary | ICD-10-CM

## 2015-04-23 DIAGNOSIS — Z1509 Genetic susceptibility to other malignant neoplasm: Principal | ICD-10-CM

## 2015-04-23 DIAGNOSIS — T451X5A Adverse effect of antineoplastic and immunosuppressive drugs, initial encounter: Secondary | ICD-10-CM

## 2015-04-23 DIAGNOSIS — Z5111 Encounter for antineoplastic chemotherapy: Secondary | ICD-10-CM

## 2015-04-23 DIAGNOSIS — Z1501 Genetic susceptibility to malignant neoplasm of breast: Principal | ICD-10-CM

## 2015-04-23 DIAGNOSIS — R180 Malignant ascites: Secondary | ICD-10-CM | POA: Diagnosis not present

## 2015-04-23 LAB — CBC WITH DIFFERENTIAL/PLATELET
BASO%: 0.3 % (ref 0.0–2.0)
Basophils Absolute: 0 10*3/uL (ref 0.0–0.1)
EOS%: 0.8 % (ref 0.0–7.0)
Eosinophils Absolute: 0 10*3/uL (ref 0.0–0.5)
HCT: 34 % — ABNORMAL LOW (ref 34.8–46.6)
HGB: 11.2 g/dL — ABNORMAL LOW (ref 11.6–15.9)
LYMPH#: 1.4 10*3/uL (ref 0.9–3.3)
LYMPH%: 39.9 % (ref 14.0–49.7)
MCH: 31.4 pg (ref 25.1–34.0)
MCHC: 32.9 g/dL (ref 31.5–36.0)
MCV: 95.2 fL (ref 79.5–101.0)
MONO#: 0.7 10*3/uL (ref 0.1–0.9)
MONO%: 18.5 % — ABNORMAL HIGH (ref 0.0–14.0)
NEUT#: 1.4 10*3/uL — ABNORMAL LOW (ref 1.5–6.5)
NEUT%: 40.5 % (ref 38.4–76.8)
Platelets: 217 10*3/uL (ref 145–400)
RBC: 3.57 10*6/uL — AB (ref 3.70–5.45)
RDW: 12.6 % (ref 11.2–14.5)
WBC: 3.6 10*3/uL — ABNORMAL LOW (ref 3.9–10.3)

## 2015-04-23 LAB — COMPREHENSIVE METABOLIC PANEL (CC13)
ALT: 13 U/L (ref 0–55)
AST: 14 U/L (ref 5–34)
Albumin: 3.4 g/dL — ABNORMAL LOW (ref 3.5–5.0)
Alkaline Phosphatase: 70 U/L (ref 40–150)
Anion Gap: 9 mEq/L (ref 3–11)
BUN: 14.2 mg/dL (ref 7.0–26.0)
CHLORIDE: 104 meq/L (ref 98–109)
CO2: 26 meq/L (ref 22–29)
CREATININE: 0.8 mg/dL (ref 0.6–1.1)
Calcium: 9.5 mg/dL (ref 8.4–10.4)
EGFR: >90 mL/min/{1.73_m2} (ref >90)
Glucose: 121 mg/dl (ref 70–140)
Potassium: 3.8 mEq/L (ref 3.5–5.1)
Sodium: 139 mEq/L (ref 136–145)
Total Bilirubin: <0.3 mg/dL (ref 0.20–1.20)
Total Protein: 7.4 g/dL (ref 6.4–8.3)

## 2015-04-23 MED ORDER — ACETAMINOPHEN 325 MG PO TABS
325.0000 mg | ORAL_TABLET | Freq: Once | ORAL | Status: AC
  Administered 2015-04-23: 325 mg via ORAL

## 2015-04-23 MED ORDER — PEGFILGRASTIM 6 MG/0.6ML ~~LOC~~ PSKT
6.0000 mg | PREFILLED_SYRINGE | Freq: Once | SUBCUTANEOUS | Status: AC
  Administered 2015-04-23: 6 mg via SUBCUTANEOUS
  Filled 2015-04-23: qty 0.6

## 2015-04-23 MED ORDER — HEPARIN SOD (PORK) LOCK FLUSH 100 UNIT/ML IV SOLN
500.0000 [IU] | Freq: Once | INTRAVENOUS | Status: AC | PRN
  Administered 2015-04-23: 500 [IU]
  Filled 2015-04-23: qty 5

## 2015-04-23 MED ORDER — ACETAMINOPHEN 325 MG PO TABS
ORAL_TABLET | ORAL | Status: AC
  Filled 2015-04-23: qty 1

## 2015-04-23 MED ORDER — PROCHLORPERAZINE MALEATE 10 MG PO TABS
ORAL_TABLET | ORAL | Status: AC
  Filled 2015-04-23: qty 1

## 2015-04-23 MED ORDER — SODIUM CHLORIDE 0.9 % IV SOLN: Freq: Once | INTRAVENOUS | Status: DC

## 2015-04-23 MED ORDER — PROCHLORPERAZINE MALEATE 10 MG PO TABS
10.0000 mg | ORAL_TABLET | Freq: Once | ORAL | Status: AC
  Administered 2015-04-23: 10 mg via ORAL

## 2015-04-23 MED ORDER — SODIUM CHLORIDE 0.9 % IJ SOLN
10.0000 mL | INTRAMUSCULAR | Status: DC | PRN
  Administered 2015-04-23: 10 mL
  Filled 2015-04-23: qty 10

## 2015-04-23 MED ORDER — SODIUM CHLORIDE 0.9 % IV SOLN
600.0000 mg/m2 | Freq: Once | INTRAVENOUS | Status: AC
  Administered 2015-04-23: 950 mg via INTRAVENOUS
  Filled 2015-04-23: qty 25

## 2015-04-23 NOTE — Telephone Encounter (Signed)
Appointments in place,avs printed and note inbox to dr Marko Plume to switch order to reflect [place] for mri

## 2015-04-23 NOTE — Patient Instructions (Signed)
[facility] Discharge Instructions for Patients Receiving Chemotherapy  Today you received the following chemotherapy agents:  Gemzar  To help prevent nausea and vomiting after your treatment, we encourage you to take your nausea medication as ordered per MD.   If you develop nausea and vomiting that is not controlled by your nausea medication, call the clinic.   BELOW ARE SYMPTOMS THAT SHOULD BE REPORTED IMMEDIATELY:  *FEVER GREATER THAN 100.5 F  *CHILLS WITH OR WITHOUT FEVER  NAUSEA AND VOMITING THAT IS NOT CONTROLLED WITH YOUR NAUSEA MEDICATION  *UNUSUAL SHORTNESS OF BREATH  *UNUSUAL BRUISING OR BLEEDING  TENDERNESS IN MOUTH AND THROAT WITH OR WITHOUT PRESENCE OF ULCERS  *URINARY PROBLEMS  *BOWEL PROBLEMS  UNUSUAL RASH Items with * indicate a potential emergency and should be followed up as soon as possible.  Feel free to call the clinic you have any questions or concerns. The clinic phone number is [phone].  Please show the CHEMO ALERT CARD at check-in to the Emergency Department and triage nurse.   

## 2015-04-23 NOTE — Progress Notes (Signed)
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 She is due second treatment of gemzar today, now trying every other week with neulasta support. First gemzar was given 04-09-15 at 800 mg/m2. Last paracentesis 04-06-15 for 1.8 liters, cytology +  Mammograms were at [facility] 02-13-15; with BRCA 2 +, previous triple negative left breast ca and exam findings in left breast (tho stable), and as she is clinically better otherwise on gemzar, I would still like breast MRI by late Oct = 3 months from the mammograms.  Patient was neutropenic on day 8 cycle 1 (04-16-15) with ANC 0.8, given granix 300 x 2 doses then. She was fatigued at least intermittently thru ~ 9-30, but no fever or symptoms of infection. Aches from granix were minimal. She felt well enough to clean house one day last week and felt well last 2 days. Appetite is better, eating 3x daily tho still some early satiety, no obvious increase in ascites now. Bowels are moving ~ every other day and I have asked her to increase stool softener and to use MOM daily now. Breathing is fine and she denies pain.    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 Patient's son lives at home tho is not there regularly.  ONCOLOGIC HISTORY Left ovarian cancer: Patient became symptomatic with abdominal swelling and constipation in late Nov 2015, seen and treated initially for what was thought to be ulcer. Symptoms continued, with early satiety causing poor po  intake; she was seen again at [facility] ED in early Jan 2016, with CT reportedly consistent with stage IIIC or IV gyn cancer, including omental caking, large pelvic mass, para-aortic adenopathy, ascites, pleural effusions and right subpleural nodule. She was seen in consultation by Dr Denman George on 07-25-14, with concern from her exam and scan for rectal involvement. 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 She had paracentesis of 3.4 liters ascites on 07-27-2014, with cytology ([id number]) showing serous carcinoma consistent with gyn primary, ER negative. She had right thoracentesis for 350 cc on 08-11-14, this requested by anesthesia prior to surgery and apparently not sent for cytology. Patient was more comfortable after paracentesis, but could not tell any difference in breathing after thoracentesis (denies SOB prior to that procedure). CA 125 on 08-02-14 was 1162. She was taken to exploratory laparotomy by Dr Denman George on 08-15-14, with 4 liters of ascites removed and biopsy of omentum obtained, with additional debulking not attempted due to surgical findings of entire peritoneal surface replaced by tumor plaque, small intestine coated with tumor and mesentery tethered with tumor nodules, ileum adherent to pelvic mass, dense plaque of tumor on right diaphragm and 10 cm omental cake in LUQ. Pathology of omental biopsy She needed another paracentesis by IR for 1.3 liters ascites on 08-17-14, and had PAC placed by IR also 08-17-14. Day 1 cycle 1 dose dense carboplatin taxol was given in hospital on 08-18-14. After 2 cycles of dose dense regimen, the carboplatin and  taxol was changed to every 3 week regimen beginning cycle 3; she had cycle 5 on 11-09-14, with gCSF support. She had interval debulking by Dr Denman George on 11-28-14. Surgery was exploratory laparotomy with TAH/ BSO, omentectomy and radical tumor debulking; R1 resection with residual milial tumor on small bowel  at completion of procedure. Tumor involved bilateral adnexa, uterine serosa, omentum and falciform ligament, with + LVSI, no nodes examined. Maximum tumor size was 4.3 cm involving left ovary. She resumed carboplatin Taxol on 12-21-14, total of 3 cycles thru 02-01-15 (= 3 cycles after interval debulking). Restaging CT CAP 03-12-15 had some changes concerning for residual or recurrent disease, and CA 125 was elevated to 64 on 03-14-15, having been 6 in July 2016.  Review of prior radiation and chemotherapy (for left breast cancer) significant for use of epirubicin and some unquantified cardiac exposure to radiation. She began gemcitabine 04-09-15. She was neutropenic by 04-16-15.   BRCA 2 abnormality identified 10-12-14.  Breast cancer: Left breast biopsy 11-22-2003 ([id number]) invasive carcinoma. Left breast wide excision with sentinel nodes and axillary contents by Dr Benson Norway 01-03-2004 [phone]) poorly differentiated invasive ductal carcinoma 3.0 cm with closest margin 1.7 cm deep, 6 sentinel nodes + 3 additional nodes all negative, ER 0, PR 0, HER 2 3+, Ki67 of 19%. . She received adjuvant chemotherapy by Dr Sonny Dandy with 5FU, epirubicin, cytoxan x 4 cycles from 02-13-2004 thru 04-16-2004, followed by taxol 80 mg/m2 weekly x 11 from 05-07-2004 thru 09-04-2004, and herceptin 05-21-2004 thru 02-25-2005. Antiemetics were Kytril and decadron. She had PAC for the chemotherapy. She had radiation by Dr Tyler Pita in [place]. Last note from Dr Sonny Dandy in this information was 04-2008. Left diagnostic mammogram [facility] 10-25-2014 stable compared with 12-06-13 and priors; due bilateral mammograms done at [facility] 02-13-15.Marland Kitchen    Review of systems as above, also: No bleeding. No significant nausea and no vomiting. Voiding ok. No LE swelling. No fever or aches after gemzar. Remainder of 10 point Review of Systems negative.  Objective:  Vital signs in last 24 hours:  BP 116/74 mmHg  Pulse 80  Temp(Src) 98.7 F (37.1 C)  (Oral)  Resp 18  Ht 5' 2"  (1.575 m)  Wt 128 lb 8 oz (58.287 kg)  BMI 23.50 kg/m2  SpO2 100% Weight stable from last visit but down 3 lbs from 03-14-15. Alert, oriented and appropriate. Ambulatory without difficulty. Looks more comfortable overall. Respirations not labored RA No alopecia  HEENT:PERRL, sclerae not icteric. Oral mucosa moist without lesions, posterior pharynx clear.  Neck supple. No JVD.  Lymphatics:no cervical,supraclavicular, axillary or inguinal adenopathy Resp: clear to auscultation bilaterally and normal percussion bilaterally Cardio: regular rate and rhythm. No gallop. GI: soft, nontender, mildly distended not tight and no clear fluid wve, no mass or organomegaly. Normally active bowel sounds. Surgical incision not remarkable. Musculoskeletal/ Extremities: without pitting edema, cords, tenderness Neuro: no change peripheral neuropathy related to taxane. Otherwise nonfocal. PSYCH appropriate mood and affect Skin without rash, ecchymosis, petechiae Breasts: left with thickening superiorly unchanged and no dominant mass or change at left lateral surgical scar; right breast without dominant mass, skin or nipple findings. Axillae benign. Portacath-without erythema or tenderness  Lab Results:  Results for orders placed or performed in visit on 04/23/15  CBC with Differential  Result Value Ref Range   WBC 3.6 (L) 3.9 - 10.3 10e3/uL   NEUT# 1.4 (L) 1.5 - 6.5 10e3/uL   HGB 11.2 (L) 11.6 - 15.9 g/dL   HCT 34.0 (L) 34.8 -  46.6 %   Platelets 217 145 - 400 10e3/uL   MCV 95.2 79.5 - 101.0 fL   MCH 31.4 25.1 - 34.0 pg   MCHC 32.9 31.5 - 36.0 g/dL   RBC 3.57 (L) 3.70 - 5.45 10e6/uL   RDW 12.6 11.2 - 14.5 %   lymph# 1.4 0.9 - 3.3 10e3/uL   MONO# 0.7 0.1 - 0.9 10e3/uL   Eosinophils Absolute 0.0 0.0 - 0.5 10e3/uL   Basophils Absolute 0.0 0.0 - 0.1 10e3/uL   NEUT% 40.5 38.4 - 76.8 %   LYMPH% 39.9 14.0 - 49.7 %   MONO% 18.5 (H) 0.0 - 14.0 %   EOS% 0.8 0.0 - 7.0 %   BASO%  0.3 0.0 - 2.0 %  Comprehensive metabolic panel (Cmet) - [facility]  Result Value Ref Range   Sodium 139 136 - 145 mEq/L   Potassium 3.8 3.5 - 5.1 mEq/L   Chloride 104 98 - 109 mEq/L   CO2 26 22 - 29 mEq/L   Glucose 121 70 - 140 mg/dl   BUN 14.2 7.0 - 26.0 mg/dL   Creatinine 0.8 0.6 - 1.1 mg/dL   Total Bilirubin <0.30 0.20 - 1.20 mg/dL   Alkaline Phosphatase 70 40 - 150 U/L   AST 14 5 - 34 U/L   ALT 13 0 - 55 U/L   Total Protein 7.4 6.4 - 8.3 g/dL   Albumin 3.4 (L) 3.5 - 5.0 g/dL   Calcium 9.5 8.4 - 10.4 mg/dL   Anion Gap 9 3 - 11 mEq/L   EGFR >90 >90 ml/min/1.73 m2    Labs discussed. CA 125 to be repeated 05-07-15, last 313 on 04-09-15 and 64 on 03-14-15.   Studies/Results:  No results found.  Medications: I have reviewed the patient's current medications.  DISCUSSION: ANC 1.4 so have decreased gemzar dose to 600 mg/m2 and will proceed with treatment given we are also adding neulasta by [employer] [facility] (approved per managed care, see my documentation note 04-19-15).   Assessment/Plan: 1. 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 Rapid recurrence/ progression of gyn cancer within 4 weeks of completing that chemo. Paracentesis 04-06-15 helpful for symptoms and confirmed serous malignancy. Fatigue with first gemzar 04-09-15 and neutropenic day 8 cycle 1.  Changed schedule to every other week gemzar with neulasta by on pro injector. I will see her with labs and treatment on 05-07-15, repeat CA 125 then. Could add avastin if needed, not discussed further today.  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 Bilateral mammograms at [facility] done  02-13-15. With BRCA 2 mutation, she can be followed with breast MRIs within 3 months of mammograms, breast MRI requested for Oct still be appropriate as she is already clinically better with treatment to control recurrent gyn ca now 5.PAC in 6.nutritional status needs to be watched closely,  po intake improved this week 7.RBBB and LAFB, HTN, known to Dr Acie Fredrickson. 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 She knows to call if concerns prior to next scheduled visit. Time spent 30 min including >50% counseling and coordination of care. CC PCP  Shona Pardo P, MD   04/23/2015, 11:21 AM

## 2015-05-02 ENCOUNTER — Other Ambulatory Visit: Payer: Self-pay | Admitting: Oncology

## 2015-05-07 ENCOUNTER — Encounter: Payer: Self-pay | Admitting: Oncology

## 2015-05-07 ENCOUNTER — Ambulatory Visit (HOSPITAL_BASED_OUTPATIENT_CLINIC_OR_DEPARTMENT_OTHER): Payer: 59 | Admitting: Oncology

## 2015-05-07 ENCOUNTER — Other Ambulatory Visit (HOSPITAL_BASED_OUTPATIENT_CLINIC_OR_DEPARTMENT_OTHER): Payer: 59

## 2015-05-07 ENCOUNTER — Ambulatory Visit (HOSPITAL_BASED_OUTPATIENT_CLINIC_OR_DEPARTMENT_OTHER): Payer: 59

## 2015-05-07 VITALS — BP 118/80 | HR 91 | Temp 99.1°F | Resp 18 | Ht 62.0 in | Wt 129.8 lb

## 2015-05-07 DIAGNOSIS — C562 Malignant neoplasm of left ovary: Secondary | ICD-10-CM | POA: Diagnosis not present

## 2015-05-07 DIAGNOSIS — Z5111 Encounter for antineoplastic chemotherapy: Secondary | ICD-10-CM | POA: Diagnosis not present

## 2015-05-07 DIAGNOSIS — C561 Malignant neoplasm of right ovary: Secondary | ICD-10-CM | POA: Diagnosis not present

## 2015-05-07 DIAGNOSIS — D701 Agranulocytosis secondary to cancer chemotherapy: Secondary | ICD-10-CM | POA: Diagnosis not present

## 2015-05-07 DIAGNOSIS — G620 Drug-induced polyneuropathy: Secondary | ICD-10-CM

## 2015-05-07 DIAGNOSIS — Z5189 Encounter for other specified aftercare: Secondary | ICD-10-CM | POA: Diagnosis not present

## 2015-05-07 DIAGNOSIS — Z853 Personal history of malignant neoplasm of breast: Secondary | ICD-10-CM

## 2015-05-07 DIAGNOSIS — Z1501 Genetic susceptibility to malignant neoplasm of breast: Principal | ICD-10-CM

## 2015-05-07 DIAGNOSIS — C50912 Malignant neoplasm of unspecified site of left female breast: Secondary | ICD-10-CM

## 2015-05-07 DIAGNOSIS — Z95828 Presence of other vascular implants and grafts: Secondary | ICD-10-CM | POA: Diagnosis not present

## 2015-05-07 DIAGNOSIS — Z1509 Genetic susceptibility to other malignant neoplasm: Principal | ICD-10-CM

## 2015-05-07 DIAGNOSIS — T451X5A Adverse effect of antineoplastic and immunosuppressive drugs, initial encounter: Secondary | ICD-10-CM

## 2015-05-07 DIAGNOSIS — R180 Malignant ascites: Secondary | ICD-10-CM

## 2015-05-07 LAB — COMPREHENSIVE METABOLIC PANEL (CC13)
ALBUMIN: 3.5 g/dL (ref 3.5–5.0)
ALK PHOS: 79 U/L (ref 40–150)
ALT: 18 U/L (ref 0–55)
AST: 14 U/L (ref 5–34)
Anion Gap: 8 mEq/L (ref 3–11)
BUN: 13 mg/dL (ref 7.0–26.0)
CO2: 26 meq/L (ref 22–29)
Calcium: 9.3 mg/dL (ref 8.4–10.4)
Chloride: 107 mEq/L (ref 98–109)
Creatinine: 0.7 mg/dL (ref 0.6–1.1)
GLUCOSE: 96 mg/dL (ref 70–140)
POTASSIUM: 3.9 meq/L (ref 3.5–5.1)
Sodium: 142 mEq/L (ref 136–145)
TOTAL PROTEIN: 7.2 g/dL (ref 6.4–8.3)
Total Bilirubin: <0.3 mg/dL (ref 0.20–1.20)

## 2015-05-07 LAB — CBC WITH DIFFERENTIAL/PLATELET
BASO%: 0.5 % (ref 0.0–2.0)
BASOS ABS: 0 10*3/uL (ref 0.0–0.1)
EOS ABS: 0 10*3/uL (ref 0.0–0.5)
EOS%: 0.2 % (ref 0.0–7.0)
HEMATOCRIT: 32.9 % — AB (ref 34.8–46.6)
HEMOGLOBIN: 10.7 g/dL — AB (ref 11.6–15.9)
LYMPH#: 1.1 10*3/uL (ref 0.9–3.3)
LYMPH%: 26 % (ref 14.0–49.7)
MCH: 30.9 pg (ref 25.1–34.0)
MCHC: 32.4 g/dL (ref 31.5–36.0)
MCV: 95.3 fL (ref 79.5–101.0)
MONO#: 0.8 10*3/uL (ref 0.1–0.9)
MONO%: 18.1 % — ABNORMAL HIGH (ref 0.0–14.0)
NEUT#: 2.4 10*3/uL (ref 1.5–6.5)
NEUT%: 55.2 % (ref 38.4–76.8)
Platelets: 360 10*3/uL (ref 145–400)
RBC: 3.45 10*6/uL — ABNORMAL LOW (ref 3.70–5.45)
RDW: 13.6 % (ref 11.2–14.5)
WBC: 4.3 10*3/uL (ref 3.9–10.3)

## 2015-05-07 MED ORDER — SODIUM CHLORIDE 0.9 % IV SOLN
600.0000 mg/m2 | Freq: Once | INTRAVENOUS | Status: AC
  Administered 2015-05-07: 950 mg via INTRAVENOUS
  Filled 2015-05-07: qty 24.98

## 2015-05-07 MED ORDER — SODIUM CHLORIDE 0.9 % IV SOLN
Freq: Once | INTRAVENOUS | Status: AC
  Administered 2015-05-07: 17:00:00 via INTRAVENOUS

## 2015-05-07 MED ORDER — PEGFILGRASTIM 6 MG/0.6ML ~~LOC~~ PSKT
6.0000 mg | PREFILLED_SYRINGE | Freq: Once | SUBCUTANEOUS | Status: AC
  Administered 2015-05-07: 6 mg via SUBCUTANEOUS
  Filled 2015-05-07: qty 0.6

## 2015-05-07 MED ORDER — PROCHLORPERAZINE MALEATE 10 MG PO TABS
ORAL_TABLET | ORAL | Status: AC
  Filled 2015-05-07: qty 1

## 2015-05-07 MED ORDER — ACETAMINOPHEN 325 MG PO TABS
325.0000 mg | ORAL_TABLET | Freq: Once | ORAL | Status: AC
  Administered 2015-05-07: 325 mg via ORAL

## 2015-05-07 MED ORDER — PROCHLORPERAZINE MALEATE 10 MG PO TABS
10.0000 mg | ORAL_TABLET | Freq: Once | ORAL | Status: AC
  Administered 2015-05-07: 10 mg via ORAL

## 2015-05-07 MED ORDER — ACETAMINOPHEN 325 MG PO TABS
ORAL_TABLET | ORAL | Status: AC
  Filled 2015-05-07: qty 1

## 2015-05-07 MED ORDER — TRAMADOL HCL 50 MG PO TABS: 50.0000 mg | ORAL_TABLET | Freq: Four times a day (QID) | ORAL | Status: DC | PRN

## 2015-05-07 MED ORDER — PEGFILGRASTIM 6 MG/0.6ML ~~LOC~~ PSKT
6.0000 mg | PREFILLED_SYRINGE | Freq: Once | SUBCUTANEOUS | Status: AC
Start: 2015-05-07 — End: 2015-05-07
  Administered 2015-05-07: 6 mg via SUBCUTANEOUS
  Filled 2015-05-07: qty 0.6

## 2015-05-07 NOTE — Patient Instructions (Signed)
[facility] Discharge Instructions for Patients Receiving Chemotherapy  Today you received the following chemotherapy agents:  Gemzar  To help prevent nausea and vomiting after your treatment, we encourage you to take your nausea medication as ordered per MD.   If you develop nausea and vomiting that is not controlled by your nausea medication, call the clinic.   BELOW ARE SYMPTOMS THAT SHOULD BE REPORTED IMMEDIATELY:  *FEVER GREATER THAN 100.5 F  *CHILLS WITH OR WITHOUT FEVER  NAUSEA AND VOMITING THAT IS NOT CONTROLLED WITH YOUR NAUSEA MEDICATION  *UNUSUAL SHORTNESS OF BREATH  *UNUSUAL BRUISING OR BLEEDING  TENDERNESS IN MOUTH AND THROAT WITH OR WITHOUT PRESENCE OF ULCERS  *URINARY PROBLEMS  *BOWEL PROBLEMS  UNUSUAL RASH Items with * indicate a potential emergency and should be followed up as soon as possible.  Feel free to call the clinic you have any questions or concerns. The clinic phone number is [phone].  Please show the CHEMO ALERT CARD at check-in to the Emergency Department and triage nurse.   

## 2015-05-07 NOTE — Progress Notes (Signed)
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   Patient had second gemzar, dose reduced to 600 mg/m2, on 04-23-15, with On Pro neulasta. She had abdominal pain day after chemo that lasted for a few hours and was not associated with vomiting, then generally weak with some chills for next 3 days (did not check temperature). She has subsequently felt fairly well, unable to tell any reaccumulation of ascites since paracentesis 04-06-15 for 1.8 liters. Bowels are moving adequately and no problems with bladder. No SOB or chest pain. Tolerating larger quantities po. No symptoms of infection now.  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 Symptoms continued, with early satiety causing poor po intake; she was seen again at [facility] ED in early Jan 2016, with CT reportedly consistent with stage IIIC or IV gyn cancer, including omental caking, large pelvic mass, para-aortic adenopathy, ascites, pleural effusions and right subpleural nodule. She was seen in consultation by Dr Denman George on 07-25-14, with concern from her exam and scan for rectal involvement. 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 She had paracentesis of 3.4 liters ascites on 07-27-2014, with cytology ([id number])  showing serous carcinoma consistent with gyn primary, ER negative. She had right thoracentesis for 350 cc on 08-11-14, this requested by anesthesia prior to surgery and apparently not sent for cytology. Patient was more comfortable after paracentesis, but could not tell any difference in breathing after thoracentesis (denies SOB prior to that procedure). CA 125 on 08-02-14 was 1162. She was taken to exploratory laparotomy by Dr Denman George on 08-15-14, with 4 liters of ascites removed and biopsy of omentum obtained, with additional debulking not attempted due to surgical findings of entire peritoneal surface replaced by tumor plaque, small intestine coated with tumor and mesentery tethered with tumor nodules, ileum adherent to pelvic mass, dense plaque of tumor on right diaphragm and 10 cm omental cake in LUQ. Pathology of omental biopsy She needed another paracentesis by IR for 1.3 liters ascites on 08-17-14, and had PAC placed by IR also 08-17-14. Day 1 cycle 1 dose dense carboplatin taxol was given in hospital on 08-18-14. After 2 cycles of dose dense regimen, the carboplatin and taxol was changed to every 3 week regimen beginning cycle 3; she had cycle 5 on 11-09-14, with gCSF support. She had interval debulking by Dr Denman George on 11-28-14. Surgery was exploratory laparotomy with TAH/ BSO, omentectomy and radical tumor debulking; R1 resection with residual milial tumor on small bowel at completion of procedure. Tumor involved bilateral adnexa, uterine serosa, omentum and falciform ligament, with + LVSI, no nodes examined. Maximum tumor size was 4.3 cm involving left ovary. She resumed carboplatin Taxol on 12-21-14, total of 3 cycles thru 02-01-15 (= 3 cycles after interval debulking). Restaging CT CAP 03-12-15 had some changes concerning for residual or recurrent disease,  and CA 125 was elevated to 64 on 03-14-15, having been 6 in July 2016.  Review of prior radiation and chemotherapy (for left breast cancer) significant for use  of epirubicin and some unquantified cardiac exposure to radiation. She began gemcitabine 04-09-15. She was neutropenic by 04-16-15. Regimen was changed to every other week with neulasta support, gemzar dose also decreased beginning cycle 2.  BRCA 2 abnormality identified 10-12-14.  Breast cancer: Left breast biopsy 11-22-2003 ([id number]) invasive carcinoma. Left breast wide excision with sentinel nodes and axillary contents by Dr Benson Norway 01-03-2004 [phone]) poorly differentiated invasive ductal carcinoma 3.0 cm with closest margin 1.7 cm deep, 6 sentinel nodes + 3 additional nodes all negative, ER 0, PR 0, HER 2 3+, Ki67 of 19%. . She received adjuvant chemotherapy by Dr Sonny Dandy with 5FU, epirubicin, cytoxan x 4 cycles from 02-13-2004 thru 04-16-2004, followed by taxol 80 mg/m2 weekly x 11 from 05-07-2004 thru 09-04-2004, and herceptin 05-21-2004 thru 02-25-2005. Antiemetics were Kytril and decadron. She had PAC for the chemotherapy. She had radiation by Dr Tyler Pita in [place]. Last note from Dr Sonny Dandy in this information was 04-2008. Left diagnostic mammogram [facility] 10-25-2014 stable compared with 12-06-13 and priors; due bilateral mammograms again late May 2016.    Review of systems as above, also: No sinus congestion or drainage. No problem with PAC. No abdominal pain now, no other pain. No LE swelling. No changes in breasts. No bleeding Remainder of 10 point Review of Systems negative.  Objective:  Vital signs in last 24 hours:  BP 118/80 mmHg  Pulse 91  Temp(Src) 99.1 F (37.3 C) (Oral)  Resp 18  Ht 5' 2"  (1.575 m)  Wt 129 lb 12.8 oz (58.877 kg)  BMI 23.73 kg/m2  SpO2 97% Weight up 1 lb.  Alert, oriented and appropriate. Ambulatory without difficulty. Looks comfortable, very pleasant as always. No alopecia  HEENT:PERRL, sclerae not icteric. Oral mucosa moist without lesions, posterior pharynx clear.  Neck supple. No JVD.  Lymphatics:no cervical,supraclavicular, axillary or inguinal  adenopathy Resp: clear to auscultation bilaterally and normal percussion bilaterally Cardio: regular rate and rhythm. No gallop. GI: soft, nontender, minimally distended not increased, no mass or organomegaly. Normally active bowel sounds. Surgical incision not remarkable. Musculoskeletal/ Extremities: without pitting edema, cords, tenderness Neuro: no peripheral neuropathy. Otherwise nonfocal. PSYCH appropriate mood and affect Skin without rash, ecchymosis, petechiae Breasts: right without dominant mass, skin or nipple findings. Left with thickening superiorly unchanged, well healed lumpectomy scar, slight radiation changes, all stable. Axillae benign. Portacath-without erythema or tenderness  Lab Results:  Results for orders placed or performed in visit on 05/07/15  CBC with Differential  Result Value Ref Range   WBC 4.3 3.9 - 10.3 10e3/uL   NEUT# 2.4 1.5 - 6.5 10e3/uL   HGB 10.7 (L) 11.6 - 15.9 g/dL   HCT 32.9 (L) 34.8 - 46.6 %   Platelets 360 145 - 400 10e3/uL   MCV 95.3 79.5 - 101.0 fL   MCH 30.9 25.1 - 34.0 pg   MCHC 32.4 31.5 - 36.0 g/dL   RBC 3.45 (L) 3.70 - 5.45 10e6/uL   RDW 13.6 11.2 - 14.5 %   lymph# 1.1 0.9 - 3.3 10e3/uL   MONO# 0.8 0.1 - 0.9 10e3/uL   Eosinophils Absolute 0.0 0.0 - 0.5 10e3/uL   Basophils Absolute 0.0 0.0 - 0.1 10e3/uL   NEUT% 55.2 38.4 - 76.8 %   LYMPH% 26.0 14.0 - 49.7 %   MONO% 18.1 (H) 0.0 - 14.0 %  EOS% 0.2 0.0 - 7.0 %   BASO% 0.5 0.0 - 2.0 %  Comprehensive metabolic panel (Cmet) - [facility]  Result Value Ref Range   Sodium 142 136 - 145 mEq/L   Potassium 3.9 3.5 - 5.1 mEq/L   Chloride 107 98 - 109 mEq/L   CO2 26 22 - 29 mEq/L   Glucose 96 70 - 140 mg/dl   BUN 13.0 7.0 - 26.0 mg/dL   Creatinine 0.7 0.6 - 1.1 mg/dL   Total Bilirubin <0.30 0.20 - 1.20 mg/dL   Alkaline Phosphatase 79 40 - 150 U/L   AST 14 5 - 34 U/L   ALT 18 0 - 55 U/L   Total Protein 7.2 6.4 - 8.3 g/dL   Albumin 3.5 3.5 - 5.0 g/dL   Calcium 9.3 8.4 - 10.4 mg/dL   Anion  Gap 8 3 - 11 mEq/L   EGFR >90 >90 ml/min/1.73 m2   CA 125 available after visit now 254, having been 313 on 04-09-15 as baseline for gemzar  Studies/Results:  No results found.  Breast MRI not yet scheduled that I can tell or that patient knows, needed within 3 months of 02-13-15 mammograms. Message now to managed care re preauth and will ask schedulers to follow up  Medications: I have reviewed the patient's current medications. She will try regular tylenol beginning night of gemzar and for next few days after each treatment  DISCUSSION: clinically some better since starting gemzar and tolerating this adequately so far. Continue every other week with On Pro neulasta and additional antipyretic. I will see her with treatments in 2 weeks and 4 weeks. Breast MRI as above, appropriate particularly as she does seem to be improving with present treatment for recurrent ovarian ca.  Assessment/Plan:  1. 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 Rapid recurrence/ progression of gyn cancer within 4 weeks of completing that chemo. 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 Bilateral mammograms at [facility] done 02-13-15. With BRCA 2 mutation, and as she is responding to treatment for the ovarian cancer, will try to get breast MRI done now. 5.PAC in 6.nutritional status needs to be watched closely, hopefully po intake will improve after paracentesis 7.RBBB and LAFB, HTN, known to Dr Acie Fredrickson. 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 On Pro system very useful given travel distance.  All questions answered and patient is in agreement with recommendations and plans. She knows to call prior to next scheduled visit if needed.  Chemo and neulasta orders confirmed. Time spent 25 min including >50% counseling and coordination of care. Cc PCP  Gordy Levan, MD   05/07/2015, 9:25 PM

## 2015-05-08 LAB — CA 125: CA 125: 254 U/mL — ABNORMAL HIGH (ref <35)

## 2015-05-09 ENCOUNTER — Encounter: Payer: Self-pay | Admitting: Oncology

## 2015-05-09 NOTE — Progress Notes (Signed)
Per Lea at [employer]--they are faxing a new fmla form to extend her leave.

## 2015-05-10 ENCOUNTER — Telehealth: Payer: Self-pay | Admitting: Oncology

## 2015-05-10 NOTE — Telephone Encounter (Signed)
11/7 pof appointments made per pof and will mail to pateint

## 2015-05-14 ENCOUNTER — Encounter: Payer: Self-pay | Admitting: Oncology

## 2015-05-14 NOTE — Progress Notes (Signed)
I placed new fmla form on desk of nurse for dr. Marko Plume. This one will extend leave past 05/21/15.

## 2015-05-18 ENCOUNTER — Telehealth: Payer: Self-pay

## 2015-05-18 NOTE — Telephone Encounter (Signed)
S/w pt that we did fax fitness for duty papers to [employer]

## 2015-05-20 ENCOUNTER — Other Ambulatory Visit: Payer: Self-pay | Admitting: Oncology

## 2015-05-21 ENCOUNTER — Ambulatory Visit (HOSPITAL_BASED_OUTPATIENT_CLINIC_OR_DEPARTMENT_OTHER): Payer: 59 | Admitting: Oncology

## 2015-05-21 ENCOUNTER — Encounter: Payer: Self-pay | Admitting: Oncology

## 2015-05-21 ENCOUNTER — Other Ambulatory Visit (HOSPITAL_BASED_OUTPATIENT_CLINIC_OR_DEPARTMENT_OTHER): Payer: 59

## 2015-05-21 ENCOUNTER — Ambulatory Visit (HOSPITAL_BASED_OUTPATIENT_CLINIC_OR_DEPARTMENT_OTHER): Payer: 59

## 2015-05-21 VITALS — BP 147/83 | HR 80 | Temp 98.1°F | Resp 18 | Ht 62.0 in | Wt 129.4 lb

## 2015-05-21 DIAGNOSIS — Z1501 Genetic susceptibility to malignant neoplasm of breast: Principal | ICD-10-CM

## 2015-05-21 DIAGNOSIS — Z1509 Genetic susceptibility to other malignant neoplasm: Principal | ICD-10-CM

## 2015-05-21 DIAGNOSIS — D701 Agranulocytosis secondary to cancer chemotherapy: Secondary | ICD-10-CM

## 2015-05-21 DIAGNOSIS — G629 Polyneuropathy, unspecified: Secondary | ICD-10-CM

## 2015-05-21 DIAGNOSIS — Z5111 Encounter for antineoplastic chemotherapy: Secondary | ICD-10-CM

## 2015-05-21 DIAGNOSIS — C562 Malignant neoplasm of left ovary: Secondary | ICD-10-CM

## 2015-05-21 DIAGNOSIS — Z5189 Encounter for other specified aftercare: Secondary | ICD-10-CM | POA: Diagnosis not present

## 2015-05-21 DIAGNOSIS — C561 Malignant neoplasm of right ovary: Secondary | ICD-10-CM | POA: Diagnosis not present

## 2015-05-21 DIAGNOSIS — T451X5A Adverse effect of antineoplastic and immunosuppressive drugs, initial encounter: Secondary | ICD-10-CM

## 2015-05-21 DIAGNOSIS — C569 Malignant neoplasm of unspecified ovary: Secondary | ICD-10-CM

## 2015-05-21 DIAGNOSIS — R180 Malignant ascites: Secondary | ICD-10-CM

## 2015-05-21 DIAGNOSIS — D6481 Anemia due to antineoplastic chemotherapy: Secondary | ICD-10-CM

## 2015-05-21 DIAGNOSIS — C50912 Malignant neoplasm of unspecified site of left female breast: Secondary | ICD-10-CM

## 2015-05-21 DIAGNOSIS — G620 Drug-induced polyneuropathy: Secondary | ICD-10-CM

## 2015-05-21 DIAGNOSIS — K219 Gastro-esophageal reflux disease without esophagitis: Secondary | ICD-10-CM

## 2015-05-21 LAB — CBC WITH DIFFERENTIAL/PLATELET
BASO%: 0.6 % (ref 0.0–2.0)
BASOS ABS: 0 10*3/uL (ref 0.0–0.1)
EOS%: 0.4 % (ref 0.0–7.0)
Eosinophils Absolute: 0 10*3/uL (ref 0.0–0.5)
HCT: 30.4 % — ABNORMAL LOW (ref 34.8–46.6)
HGB: 10 g/dL — ABNORMAL LOW (ref 11.6–15.9)
LYMPH%: 27 % (ref 14.0–49.7)
MCH: 31.2 pg (ref 25.1–34.0)
MCHC: 32.9 g/dL (ref 31.5–36.0)
MCV: 94.7 fL (ref 79.5–101.0)
MONO#: 0.9 10*3/uL (ref 0.1–0.9)
MONO%: 16.3 % — ABNORMAL HIGH (ref 0.0–14.0)
NEUT#: 3 10*3/uL (ref 1.5–6.5)
NEUT%: 55.7 % (ref 38.4–76.8)
PLATELETS: 341 10*3/uL (ref 145–400)
RBC: 3.21 10*6/uL — ABNORMAL LOW (ref 3.70–5.45)
RDW: 14.9 % — ABNORMAL HIGH (ref 11.2–14.5)
WBC: 5.3 10*3/uL (ref 3.9–10.3)
lymph#: 1.4 10*3/uL (ref 0.9–3.3)

## 2015-05-21 LAB — COMPREHENSIVE METABOLIC PANEL (CC13)
ALT: 9 U/L (ref 0–55)
ANION GAP: 7 meq/L (ref 3–11)
AST: 8 U/L (ref 5–34)
Albumin: 3.3 g/dL — ABNORMAL LOW (ref 3.5–5.0)
Alkaline Phosphatase: 89 U/L (ref 40–150)
BUN: 12.5 mg/dL (ref 7.0–26.0)
CALCIUM: 9.5 mg/dL (ref 8.4–10.4)
CHLORIDE: 104 meq/L (ref 98–109)
CO2: 27 meq/L (ref 22–29)
CREATININE: 0.7 mg/dL (ref 0.6–1.1)
EGFR: >90 mL/min/{1.73_m2} (ref >90)
Glucose: 102 mg/dl (ref 70–140)
POTASSIUM: 3.8 meq/L (ref 3.5–5.1)
Sodium: 138 mEq/L (ref 136–145)
Total Bilirubin: <0.3 mg/dL (ref 0.20–1.20)
Total Protein: 7.5 g/dL (ref 6.4–8.3)

## 2015-05-21 MED ORDER — PROCHLORPERAZINE MALEATE 10 MG PO TABS
ORAL_TABLET | ORAL | Status: AC
  Filled 2015-05-21: qty 1

## 2015-05-21 MED ORDER — SODIUM CHLORIDE 0.9 % IV SOLN
600.0000 mg/m2 | Freq: Once | INTRAVENOUS | Status: AC
  Administered 2015-05-21: 950 mg via INTRAVENOUS
  Filled 2015-05-21: qty 24.98

## 2015-05-21 MED ORDER — PANTOPRAZOLE SODIUM 40 MG PO TBEC: 40.0000 mg | DELAYED_RELEASE_TABLET | Freq: Every day | ORAL | Status: DC

## 2015-05-21 MED ORDER — ACETAMINOPHEN 325 MG PO TABS
325.0000 mg | ORAL_TABLET | Freq: Once | ORAL | Status: AC
  Administered 2015-05-21: 325 mg via ORAL

## 2015-05-21 MED ORDER — TRAMADOL HCL 50 MG PO TABS: 50.0000 mg | ORAL_TABLET | Freq: Four times a day (QID) | ORAL | Status: DC | PRN

## 2015-05-21 MED ORDER — ACETAMINOPHEN 325 MG PO TABS
ORAL_TABLET | ORAL | Status: AC
  Filled 2015-05-21: qty 1

## 2015-05-21 MED ORDER — GABAPENTIN 100 MG PO CAPS: 100.0000 mg | ORAL_CAPSULE | Freq: Every day | ORAL | Status: AC

## 2015-05-21 MED ORDER — PROCHLORPERAZINE MALEATE 10 MG PO TABS
10.0000 mg | ORAL_TABLET | Freq: Once | ORAL | Status: AC
  Administered 2015-05-21: 10 mg via ORAL

## 2015-05-21 MED ORDER — PEGFILGRASTIM 6 MG/0.6ML ~~LOC~~ PSKT
6.0000 mg | PREFILLED_SYRINGE | Freq: Once | SUBCUTANEOUS | Status: AC
  Administered 2015-05-21: 6 mg via SUBCUTANEOUS
  Filled 2015-05-21: qty 0.6

## 2015-05-21 MED ORDER — SODIUM CHLORIDE 0.9 % IJ SOLN
10.0000 mL | INTRAMUSCULAR | Status: DC | PRN
Start: 2015-05-21 — End: 2015-05-21
  Administered 2015-05-21: 10 mL
  Filled 2015-05-21: qty 10

## 2015-05-21 MED ORDER — SODIUM CHLORIDE 0.9 % IV SOLN
Freq: Once | INTRAVENOUS | Status: AC
  Administered 2015-05-21: 12:00:00 via INTRAVENOUS

## 2015-05-21 MED ORDER — SUCRALFATE 1 GM/10ML PO SUSP: 1.0000 g | Freq: Three times a day (TID) | ORAL | Status: AC

## 2015-05-21 MED ORDER — HEPARIN SOD (PORK) LOCK FLUSH 100 UNIT/ML IV SOLN
500.0000 [IU] | Freq: Once | INTRAVENOUS | Status: AC | PRN
  Administered 2015-05-21: 500 [IU]
  Filled 2015-05-21: qty 5

## 2015-05-21 NOTE — Patient Instructions (Signed)
[facility] Discharge Instructions for Patients Receiving Chemotherapy  Today you received the following chemotherapy agents Gemzar  To help prevent nausea and vomiting after your treatment, we encourage you to take your nausea medication as directed.    If you develop nausea and vomiting that is not controlled by your nausea medication, call the clinic.   BELOW ARE SYMPTOMS THAT SHOULD BE REPORTED IMMEDIATELY:  *FEVER GREATER THAN 100.5 F  *CHILLS WITH OR WITHOUT FEVER  NAUSEA AND VOMITING THAT IS NOT CONTROLLED WITH YOUR NAUSEA MEDICATION  *UNUSUAL SHORTNESS OF BREATH  *UNUSUAL BRUISING OR BLEEDING  TENDERNESS IN MOUTH AND THROAT WITH OR WITHOUT PRESENCE OF ULCERS  *URINARY PROBLEMS  *BOWEL PROBLEMS  UNUSUAL RASH Items with * indicate a potential emergency and should be followed up as soon as possible.  Feel free to call the clinic you have any questions or concerns. The clinic phone number is [phone].  Please show the CHEMO ALERT CARD at check-in to the Emergency Department and triage nurse.   

## 2015-05-21 NOTE — Progress Notes (Signed)
OFFICE PROGRESS NOTE   May 21, 2015   Physicians:(K.Karb, M.Manning), E.Rossi, Cleda Mccreedy (PCP), [name]  INTERVAL HISTORY:  Patient is seen, alone for visit, continuing gemcitabine with neulasta support every other week for high grade serous papillary carcinoma of right ovary which recurred with malignant ascites within weeks of completing adjuvant carbo taxol for IIIC disease.  She is now known to have BRCA 2 mutation, history of left breast cancer 2005 (ER PR negative HER 2 positive).  Patient had #3 gemzar on 05-07-15 with neulasta by On Pro injector. She was fatigued x 3-4 days after chemo, but did not have fever or aches. She notes progressive improvement in abdominal distension, with bowels moving daily and appetite better. Bladder is ok. No bleeding. No LE swelling. No problems with PAC. No symptoms of infection. She has had left lower back pain for last few days since lifting heavy potted plants; that discomfort seems some better now, prn tramadol and heating pad helpful. No other pain  Review of systems as above, remainder of 10 point Review of Systems negative.     Last mammograms were at [facility] 02-13-15, stable changes in left breast and no new findings of concern, breast tissue dense; these were not tomo mammograms. Despite orders and MD followup requests, she has not yet had or even been scheduled for breast MRI, which is medically necessary given history particularly as she is responding to treatment for the gyn cancer now.  Patient tells me that she has not been contacted by radiology. Central radiology scheduling has a note that, per patient, insurance would not cover. I have confirmed with [facility] financial staff that the breast MRI is authorized for known BRCA 2 abnormality. I have spoken directly with radiologist at [facility], who has given approval for the breast MRI now just beyond 3 months from last mammograms; I have spoken with central scheduling again to  let them know that radiologist agrees. I understand that the MRI is to be scheduled upcoming. Patient aware and agrees.    PAC in  BRCA 2 mutation ([id number]*) by [employer] 10-12-14 Flu vaccine done 04-02-15 CA 125 on 04-09-15 baseline for gemzar 313    ONCOLOGIC HISTORY Left ovarian cancer: Patient became symptomatic with abdominal swelling and constipation in late Nov 2015, seen and treated initially for what was thought to be ulcer. Symptoms continued, with early satiety causing poor po intake; she was seen again at [facility] ED in early Jan 2016, with CT reportedly consistent with stage IIIC or IV gyn cancer, including omental caking, large pelvic mass, para-aortic adenopathy, ascites, pleural effusions and right subpleural nodule. She was seen in consultation by Dr Denman George on 07-25-14, with concern from her exam and scan for rectal involvement. 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 She had paracentesis of 3.4 liters ascites on 07-27-2014, with cytology ([id number]) showing serous carcinoma consistent with gyn primary, ER negative. She had right thoracentesis for 350 cc on 08-11-14, this requested by anesthesia prior to surgery and apparently not sent for cytology. Patient was more comfortable after paracentesis, but could not tell any difference in breathing after thoracentesis (denies SOB prior to that procedure). CA 125 on 08-02-14 was 1162. She was taken to exploratory laparotomy by Dr Denman George on 08-15-14, with 4 liters of ascites removed and biopsy of omentum obtained, with additional debulking not attempted due to surgical findings of entire peritoneal surface replaced by tumor plaque, small intestine coated with tumor  and mesentery tethered with tumor nodules, ileum adherent to pelvic mass, dense plaque of tumor on right diaphragm and 10 cm omental cake in LUQ. Pathology of omental biopsy She needed another paracentesis by IR for 1.3  liters ascites on 08-17-14, and had PAC placed by IR also 08-17-14. Day 1 cycle 1 dose dense carboplatin taxol was given in hospital on 08-18-14. After 2 cycles of dose dense regimen, the carboplatin and taxol was changed to every 3 week regimen beginning cycle 3; she had cycle 5 on 11-09-14, with gCSF support. She had interval debulking by Dr Denman George on 11-28-14. Surgery was exploratory laparotomy with TAH/ BSO, omentectomy and radical tumor debulking; R1 resection with residual milial tumor on small bowel at completion of procedure. Tumor involved bilateral adnexa, uterine serosa, omentum and falciform ligament, with + LVSI, no nodes examined. Maximum tumor size was 4.3 cm involving left ovary. She resumed carboplatin Taxol on 12-21-14, total of 3 cycles thru 02-01-15 (= 3 cycles after interval debulking). Restaging CT CAP 03-12-15 had some changes concerning for residual or recurrent disease, and CA 125 was elevated to 64 on 03-14-15, having been 6 in July 2016.  Review of prior radiation and chemotherapy (for left breast cancer) significant for use of epirubicin and some unquantified cardiac exposure to radiation. She began gemcitabine 04-09-15. She was neutropenic by 04-16-15. Regimen was changed to every other week with neulasta support, gemzar dose also decreased beginning cycle 2.  BRCA 2 abnormality identified 10-12-14.  Breast cancer: Left breast biopsy 11-22-2003 ([id number]) invasive carcinoma. Left breast wide excision with sentinel nodes and axillary contents by Dr Benson Norway 01-03-2004 [phone]) poorly differentiated invasive ductal carcinoma 3.0 cm with closest margin 1.7 cm deep, 6 sentinel nodes + 3 additional nodes all negative, ER 0, PR 0, HER 2 3+, Ki67 of 19%. . She received adjuvant chemotherapy by Dr Sonny Dandy with 5FU, epirubicin, cytoxan x 4 cycles from 02-13-2004 thru 04-16-2004, followed by taxol 80 mg/m2 weekly x 11 from 05-07-2004 thru 09-04-2004, and herceptin 05-21-2004 thru 02-25-2005.  Antiemetics were Kytril and decadron. She had PAC for the chemotherapy. She had radiation by Dr Tyler Pita in [place]. Last note from Dr Sonny Dandy in this information was 04-2008. Left diagnostic mammogram [facility] 10-25-2014 stable compared with 12-06-13 and priors; due bilateral mammograms again late May 2016.        Objective:  Vital signs in last 24 hours:  BP 147/83 mmHg  Pulse 80  Temp(Src) 98.1 F (36.7 C) (Oral)  Resp 18  Ht 5' 2"  (1.575 m)  Wt 129 lb 6.4 oz (58.695 kg)  BMI 23.66 kg/m2  SpO2 95% Weight stable. Alert, oriented and appropriate. Ambulatory without assistance, changes positions carefully with left low back pain. Marland Kitchen Respirations not labored. Looks comfortable, very pleasant as always, excellent historian. Partial alopecia  HEENT:PERRL, sclerae not icteric. Oral mucosa moist without lesions, posterior pharynx clear.  Neck supple. No JVD.  Lymphatics:no cervical,supraclavicular, axillary or inguinal adenopathy Resp: clear to auscultation bilaterally and normal percussion bilaterally Cardio: regular rate and rhythm. No gallop. GI: soft, nontender, now minimally distended, no mass or organomegaly. Normal bowel sounds. Surgical incision not remarkable. Musculoskeletal/ Extremities: without pitting edema, cords, tenderness. Spine not tender.  Neuro: no change peripheral neuropathy. Otherwise nonfocal. Psych appropriate mood and affect Skin without rash including low back, ecchymosis, petechiae Breasts: Left thickening superiorly as previously otherwise without dominant mass, skin or nipple findings. Axillae benign. Portacath-without erythema or tenderness  Lab Results:  Results for orders placed or performed  in visit on 05/21/15  CBC with Differential  Result Value Ref Range   WBC 5.3 3.9 - 10.3 10e3/uL   NEUT# 3.0 1.5 - 6.5 10e3/uL   HGB 10.0 (L) 11.6 - 15.9 g/dL   HCT 30.4 (L) 34.8 - 46.6 %   Platelets 341 145 - 400 10e3/uL   MCV 94.7 79.5 - 101.0 fL   MCH  31.2 25.1 - 34.0 pg   MCHC 32.9 31.5 - 36.0 g/dL   RBC 3.21 (L) 3.70 - 5.45 10e6/uL   RDW 14.9 (H) 11.2 - 14.5 %   lymph# 1.4 0.9 - 3.3 10e3/uL   MONO# 0.9 0.1 - 0.9 10e3/uL   Eosinophils Absolute 0.0 0.0 - 0.5 10e3/uL   Basophils Absolute 0.0 0.0 - 0.1 10e3/uL   NEUT% 55.7 38.4 - 76.8 %   LYMPH% 27.0 14.0 - 49.7 %   MONO% 16.3 (H) 0.0 - 14.0 %   EOS% 0.4 0.0 - 7.0 %   BASO% 0.6 0.0 - 2.0 %   CMET available after visit normal with exception of albumin 3.3  CA 125 on 05-07-15   254, this having been 313 on 04-09-15  Studies/Results:  No results found.  Medications: I have reviewed the patient's current medications. Tramadol refilled  DISCUSSION: FMLA papers filled out since last visit. Back pain most likely acitivity related. She will let us know if this worsens or does not improve, in which case should image, but for now will try symptomatic measures and avoid activities that aggravate the discomfort. Stable to continue gemzar, clinically better since starting this regimen.  Breast MRI as above.  Assessment/Plan:  1. 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 Rapid recurrence/ progression of gyn cancer within 4 weeks of completing that chemo. Paracentesis 04-06-15 helpful for symptoms and confirmed serous malignancy.Clinically having response to gemzar, continue. 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 Bilateral mammograms at [facility] done 02-13-15. With BRCA 2 mutation, and as she is responding to treatment for the ovarian cancer, will try to get breast MRI done now. Appreciate help from [facility] radiologist and central radiology scheduling. 5.PAC in 6.nutritional status improved with improvement  in ascites 7.RBBB and LAFB, HTN, known to Dr Acie Fredrickson. 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 On Pro system very useful given travel distance. 16.left low back pain after heavy exertion moving potted plants indoors. Seems to be improving, plan as above  All questions answered. Chemo and neulasta orders confirmed. Breast MRI order in. Time spent 30 min including >50% counseling and coordination of care. Cc Dr Rocco Pauls, MD   05/21/2015, 10:57 AM

## 2015-05-23 ENCOUNTER — Telehealth: Payer: Self-pay

## 2015-05-23 NOTE — Telephone Encounter (Signed)
-----   Message from Gordy Levan, MD sent at 05/22/2015  3:38 PM EDT ----- Please make sure she is aware of the breast MRI appointment -  thanks

## 2015-05-23 NOTE — Telephone Encounter (Signed)
Spoke with Kathleen Bond and she stated that she has an appointment on Friday 05-25-15 at [facility].  She needs to arrive at 630 pm.  Pt. Given appointment date and time as well as address of building while in the infusion room on Monday 05-21-15.

## 2015-05-25 ENCOUNTER — Ambulatory Visit (HOSPITAL_COMMUNITY)
Admission: RE | Admit: 2015-05-25 | Discharge: 2015-05-25 | Disposition: A | Discharge status: Home / Self Care | Payer: 59 | Source: Ambulatory Visit | Attending: Oncology | Admitting: Oncology

## 2015-05-25 DIAGNOSIS — C50912 Malignant neoplasm of unspecified site of left female breast: Secondary | ICD-10-CM

## 2015-05-25 DIAGNOSIS — C569 Malignant neoplasm of unspecified ovary: Secondary | ICD-10-CM | POA: Insufficient documentation

## 2015-05-25 DIAGNOSIS — R188 Other ascites: Secondary | ICD-10-CM | POA: Insufficient documentation

## 2015-05-25 DIAGNOSIS — Z1509 Genetic susceptibility to other malignant neoplasm: Secondary | ICD-10-CM

## 2015-05-25 DIAGNOSIS — Z853 Personal history of malignant neoplasm of breast: Secondary | ICD-10-CM | POA: Insufficient documentation

## 2015-05-25 DIAGNOSIS — Z1501 Genetic susceptibility to malignant neoplasm of breast: Secondary | ICD-10-CM | POA: Insufficient documentation

## 2015-05-25 MED ORDER — GADOBENATE DIMEGLUMINE 529 MG/ML IV SOLN
15.0000 mL | Freq: Once | INTRAVENOUS | Status: AC | PRN
  Administered 2015-05-25: 11 mL via INTRAVENOUS

## 2015-05-27 ENCOUNTER — Other Ambulatory Visit: Payer: Self-pay | Admitting: Oncology

## 2015-05-28 ENCOUNTER — Ambulatory Visit (HOSPITAL_BASED_OUTPATIENT_CLINIC_OR_DEPARTMENT_OTHER): Payer: 59 | Admitting: Oncology

## 2015-05-28 ENCOUNTER — Encounter: Payer: Self-pay | Admitting: Oncology

## 2015-05-28 ENCOUNTER — Ambulatory Visit: Payer: 59

## 2015-05-28 ENCOUNTER — Other Ambulatory Visit (HOSPITAL_BASED_OUTPATIENT_CLINIC_OR_DEPARTMENT_OTHER): Payer: 59

## 2015-05-28 ENCOUNTER — Other Ambulatory Visit: Payer: Self-pay | Admitting: Oncology

## 2015-05-28 ENCOUNTER — Telehealth: Payer: Self-pay | Admitting: Oncology

## 2015-05-28 VITALS — BP 133/72 | HR 96 | Temp 99.0°F | Resp 18 | Ht 62.0 in | Wt 129.9 lb

## 2015-05-28 DIAGNOSIS — R180 Malignant ascites: Secondary | ICD-10-CM

## 2015-05-28 DIAGNOSIS — Z853 Personal history of malignant neoplasm of breast: Secondary | ICD-10-CM

## 2015-05-28 DIAGNOSIS — C562 Malignant neoplasm of left ovary: Secondary | ICD-10-CM

## 2015-05-28 DIAGNOSIS — C569 Malignant neoplasm of unspecified ovary: Secondary | ICD-10-CM

## 2015-05-28 DIAGNOSIS — Z1509 Genetic susceptibility to other malignant neoplasm: Principal | ICD-10-CM

## 2015-05-28 DIAGNOSIS — Z1501 Genetic susceptibility to malignant neoplasm of breast: Secondary | ICD-10-CM

## 2015-05-28 DIAGNOSIS — G620 Drug-induced polyneuropathy: Secondary | ICD-10-CM

## 2015-05-28 DIAGNOSIS — D701 Agranulocytosis secondary to cancer chemotherapy: Secondary | ICD-10-CM

## 2015-05-28 DIAGNOSIS — C561 Malignant neoplasm of right ovary: Secondary | ICD-10-CM

## 2015-05-28 DIAGNOSIS — T451X5A Adverse effect of antineoplastic and immunosuppressive drugs, initial encounter: Secondary | ICD-10-CM

## 2015-05-28 LAB — CBC WITH DIFFERENTIAL/PLATELET
BASO%: 0.3 % (ref 0.0–2.0)
BASOS ABS: 0.1 10*3/uL (ref 0.0–0.1)
EOS ABS: 0 10*3/uL (ref 0.0–0.5)
EOS%: 0 % (ref 0.0–7.0)
HEMATOCRIT: 30.4 % — AB (ref 34.8–46.6)
HGB: 9.8 g/dL — ABNORMAL LOW (ref 11.6–15.9)
LYMPH%: 7.7 % — ABNORMAL LOW (ref 14.0–49.7)
MCH: 30.9 pg (ref 25.1–34.0)
MCHC: 32.2 g/dL (ref 31.5–36.0)
MCV: 95.9 fL (ref 79.5–101.0)
MONO#: 5.9 10*3/uL — ABNORMAL HIGH (ref 0.1–0.9)
MONO%: 11.7 % (ref 0.0–14.0)
NEUT%: 80.3 % — ABNORMAL HIGH (ref 38.4–76.8)
NEUTROS ABS: 40.5 10*3/uL — AB (ref 1.5–6.5)
Platelets: 212 10*3/uL (ref 145–400)
RBC: 3.17 10*6/uL — ABNORMAL LOW (ref 3.70–5.45)
RDW: 15 % — ABNORMAL HIGH (ref 11.2–14.5)
WBC: 50.4 10*3/uL (ref 3.9–10.3)
lymph#: 3.9 10*3/uL — ABNORMAL HIGH (ref 0.9–3.3)

## 2015-05-28 LAB — COMPREHENSIVE METABOLIC PANEL (CC13)
ALBUMIN: 3.6 g/dL (ref 3.5–5.0)
ALT: 11 U/L (ref 0–55)
AST: 12 U/L (ref 5–34)
Alkaline Phosphatase: 157 U/L — ABNORMAL HIGH (ref 40–150)
Anion Gap: 10 mEq/L (ref 3–11)
BUN: 10.9 mg/dL (ref 7.0–26.0)
CALCIUM: 9.5 mg/dL (ref 8.4–10.4)
CO2: 27 mEq/L (ref 22–29)
CREATININE: 0.8 mg/dL (ref 0.6–1.1)
Chloride: 105 mEq/L (ref 98–109)
EGFR: 86 mL/min/{1.73_m2} — AB (ref >90)
Glucose: 78 mg/dl (ref 70–140)
Potassium: 3.7 mEq/L (ref 3.5–5.1)
SODIUM: 143 meq/L (ref 136–145)
TOTAL PROTEIN: 7.5 g/dL (ref 6.4–8.3)
Total Bilirubin: <0.3 mg/dL (ref 0.20–1.20)

## 2015-05-28 MED ORDER — HEPARIN SOD (PORK) LOCK FLUSH 100 UNIT/ML IV SOLN
500.0000 [IU] | Freq: Once | INTRAVENOUS | Status: DC
  Filled 2015-05-28: qty 5

## 2015-05-28 MED ORDER — SODIUM CHLORIDE 0.9 % IJ SOLN
10.0000 mL | INTRAMUSCULAR | Status: DC | PRN
  Filled 2015-05-28: qty 10

## 2015-05-28 NOTE — Telephone Encounter (Signed)
Appointments made and avs printed for patient °

## 2015-05-28 NOTE — Progress Notes (Signed)
Patient prefers labs drawn from arm, not port.

## 2015-05-28 NOTE — Progress Notes (Signed)
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 She had third gemzar on 05-21-15, with neulasta via on pro injector.  She has BRCA 2 mutation, history of left breast cancer 2005 (ER PR negative HER 2 positive). Breast MRI done 05-25-15, report available now no evidence of active cancer in breasts or local nodes.  Patient again had ~ 3 days of generalized fatigue after chemo, during which time she was mostly in bed. She had no specific associated symptoms, including no SOB, no fever or aches, no other pain. Intermittent nausea was controlled with prn antiemetics and she was able to drink fluids and to eat some during this. She has felt better overall since those initial few days. Neulasta injector worked without difficulty. She has had no bleeding, no abdominal or pelvic pain, very little abdominal distension now, no problems with PAC. Bowels are moving more easily. She is not aware of any changes on breast self exam.  Remainder of 10 point review of systems negative/ unchanged.  PAC in  BRCA 2 mutation ([id number]*) by [employer] 10-12-14 Flu vaccine done 04-02-15 CA 125 on 04-09-15 baseline for gemzar 313   Patient tells me that her work did not receive information for extension of her leave beyond 05-21-15, tho I had left information for [facility] managed care about this on 05-07-15 and note in EMR 05-18-15 states that this information was faxed. Staff to follow up here Note patient states that her insurance is no longer active due to this situation.  ONCOLOGIC HISTORY Left ovarian cancer: Patient became symptomatic with abdominal swelling and  constipation in late Nov 2015, seen and treated initially for what was thought to be ulcer. Symptoms continued, with early satiety causing poor po intake; she was seen again at [facility] ED in early Jan 2016, with CT reportedly consistent with stage IIIC or IV gyn cancer, including omental caking, large pelvic mass, para-aortic adenopathy, ascites, pleural effusions and right subpleural nodule. She was seen in consultation by Dr Denman George on 07-25-14, with concern from her exam and scan for rectal involvement. 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 She had paracentesis of 3.4 liters ascites on 07-27-2014, with cytology ([id number]) showing serous carcinoma consistent with gyn primary, ER negative. She had right thoracentesis for 350 cc on 08-11-14, this requested by anesthesia prior to surgery and apparently not sent for cytology. Patient was more comfortable after paracentesis, but could not tell any difference in breathing after thoracentesis (denies SOB prior to that procedure). CA 125 on 08-02-14 was 1162. She was taken to exploratory laparotomy by Dr Denman George on 08-15-14, with 4 liters of ascites removed and biopsy of omentum obtained, with additional debulking not attempted due to surgical findings of entire peritoneal surface replaced by tumor plaque, small intestine coated with tumor and mesentery tethered with tumor nodules, ileum adherent to pelvic mass, dense plaque of tumor on right diaphragm and 10 cm omental cake in LUQ. Pathology of omental biopsy She needed another paracentesis by IR for 1.3 liters ascites on 08-17-14, and had PAC placed by IR also 08-17-14. Day 1 cycle 1 dose dense carboplatin taxol was given in hospital on  08-18-14. After 2 cycles of dose dense regimen, the carboplatin and taxol was changed to every 3 week regimen beginning cycle 3; she had cycle 5 on 11-09-14, with gCSF support. She had interval debulking by Dr Denman George on 11-28-14.  Surgery was exploratory laparotomy with TAH/ BSO, omentectomy and radical tumor debulking; R1 resection with residual milial tumor on small bowel at completion of procedure. Tumor involved bilateral adnexa, uterine serosa, omentum and falciform ligament, with + LVSI, no nodes examined. Maximum tumor size was 4.3 cm involving left ovary. She resumed carboplatin Taxol on 12-21-14, total of 3 cycles thru 02-01-15 (= 3 cycles after interval debulking). Restaging CT CAP 03-12-15 had some changes concerning for residual or recurrent disease, and CA 125 was elevated to 64 on 03-14-15, having been 6 in July 2016.  Review of prior radiation and chemotherapy (for left breast cancer) significant for use of epirubicin and some unquantified cardiac exposure to radiation. She began gemcitabine 04-09-15. She was neutropenic by 04-16-15. Regimen was changed to every other week with neulasta support, gemzar dose also decreased beginning cycle 2.  BRCA 2 abnormality identified 10-12-14.  Breast cancer: Left breast biopsy 11-22-2003 ([id number]) invasive carcinoma. Left breast wide excision with sentinel nodes and axillary contents by Dr Benson Norway [id number]) poorly differentiated invasive ductal carcinoma 3.0 cm with closest margin 1.7 cm deep, 6 sentinel nodes + 3 additional nodes all negative, ER 0, PR 0, HER 2 3+, Ki67 of 19%. . She received adjuvant chemotherapy by Dr Sonny Dandy with 5FU, epirubicin, cytoxan x 4 cycles from 02-13-2004 thru 04-16-2004, followed by taxol 80 mg/m2 weekly x 11 from 05-07-2004 thru 09-04-2004, and herceptin 05-21-2004 thru 02-25-2005. Antiemetics were Kytril and decadron. She had PAC for the chemotherapy. She had radiation by Dr Tyler Pita in [place]. Last note from Dr Sonny Dandy in this information was 04-2008. Left diagnostic mammogram [facility] 10-25-2014 stable compared with 12-06-13 and priors; bilateral at [facility] 02-13-15. Breast MRI 05-25-15 had no evidence of malignancy there, this medically  necessary with BRCA 2 mutation.     Objective:  Vital signs in last 24 hours:  BP 133/72 mmHg  Pulse 96  Temp(Src) 99 F (37.2 C) (Oral)  Resp 18  Ht 5' 2"  (1.575 m)  Wt 129 lb 14.4 oz (58.922 kg)  BMI 23.75 kg/m2  SpO2 99% Weight stable Alert, oriented and appropriate. Ambulatory without assistance. Looks comfortable, respirations not labored RA  HEENT:PERRL, sclerae not icteric. Oral mucosa moist without lesions, posterior pharynx clear.  Neck supple. No JVD.  Lymphatics:no cervical,supraclavicular, axillary or inguinal adenopathy Resp: clear to auscultation bilaterally and normal percussion bilaterally Cardio: regular rate and rhythm. No gallop. GI: soft, nontender,minimally distended, no mass or organomegaly. Normally active bowel sounds. Surgical incision not remarkable. Musculoskeletal/ Extremities: without pitting edema, cords, tenderness Neuro: no change peripheral neuropathy. Otherwise nonfocal. PSYCH appropriate mood and affect Skin without rash, ecchymosis, petechiae Breasts: thickening palpable stable left superior breast, otherwise bilaterally without dominant mass, skin or nipple findings. Axillae benign. Portacath-without erythema or tenderness  Lab Results:  Results for orders placed or performed in visit on 05/28/15  CBC with Differential  Result Value Ref Range   WBC 50.4 (HH) 3.9 - 10.3 10e3/uL   NEUT# 40.5 (H) 1.5 - 6.5 10e3/uL   HGB 9.8 (L) 11.6 - 15.9 g/dL   HCT 30.4 (L) 34.8 - 46.6 %   Platelets 212 145 - 400 10e3/uL   MCV 95.9 79.5 - 101.0 fL   MCH 30.9 25.1 - 34.0 pg  MCHC 32.2 31.5 - 36.0 g/dL   RBC 3.17 (L) 3.70 - 5.45 10e6/uL   RDW 15.0 (H) 11.2 - 14.5 %   lymph# 3.9 (H) 0.9 - 3.3 10e3/uL   MONO# 5.9 (H) 0.1 - 0.9 10e3/uL   Eosinophils Absolute 0.0 0.0 - 0.5 10e3/uL   Basophils Absolute 0.1 0.0 - 0.1 10e3/uL   NEUT% 80.3 (H) 38.4 - 76.8 %   LYMPH% 7.7 (L) 14.0 - 49.7 %   MONO% 11.7 0.0 - 14.0 %   EOS% 0.0 0.0 - 7.0 %   BASO% 0.3 0.0  - 2.0 %  Comprehensive metabolic panel (Cmet) - [facility]  Result Value Ref Range   Sodium 143 136 - 145 mEq/L   Potassium 3.7 3.5 - 5.1 mEq/L   Chloride 105 98 - 109 mEq/L   CO2 27 22 - 29 mEq/L   Glucose 78 70 - 140 mg/dl   BUN 10.9 7.0 - 26.0 mg/dL   Creatinine 0.8 0.6 - 1.1 mg/dL   Total Bilirubin <0.30 0.20 - 1.20 mg/dL   Alkaline Phosphatase 157 (H) 40 - 150 U/L   AST 12 5 - 34 U/L   ALT 11 0 - 55 U/L   Total Protein 7.5 6.4 - 8.3 g/dL   Albumin 3.6 3.5 - 5.0 g/dL   Calcium 9.5 8.4 - 10.4 mg/dL   Anion Gap 10 3 - 11 mEq/L   EGFR 86 (L) >90 ml/min/1.73 m2     CA 125 on 05-07-15 down to 254, this having been 313 on 04-09-15  Studies/Results: BILATERAL BREAST MRI WITH AND WITHOUT CONTRAST  COMPARISON: Previous exam(s).  FINDINGS: Breast composition: Category c. Heterogeneously dense breast tissue.  Background parenchymal enhancement: Minimal.  Right breast: A Port-A-Cath is identified in the upper inner quadrant of the right breast. No mass or abnormal enhancement.  Left breast: Surgical changes noted in the upper-outer quadrant of the left breast compatible with prior postlumpectomy changes. No mass or abnormal enhancement.  Lymph nodes: No abnormal appearing lymph nodes.  Ancillary findings: Ascites noted in the left upper quadrant of the abdomen.  IMPRESSION: 1. No evidence of malignancy in the bilateral breasts.  2. Ascites in the left upper quadrant, likely secondary to known ovarian cancer.  Medications: I have reviewed the patient's current medications.  DISCUSSION: tolerating gemzar reasonably well, clinically has improved since start of treatment. On  Pro Neulasta very useful due to travel distance.  She understands that fatigue is often side effect of gemzar, does not feel this is excessive due to improvement otherwise. Will continue treatment. Breast MRI results discussed.  Managed Care to look into problems with work forms.  Assessment/Plan:  1. 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 Rapid recurrence/ progression of gyn cancer within 4 weeks of completing that chemo. 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 Bilateral mammograms at [facility] done 02-13-15. With BRCA 2 mutation, and as she is responding to treatment for the ovarian cancer, breast MRI done 05-25-15, no evidence of active malignancy there. 5.PAC in 6.nutritional status improved, continuing to address. 7.RBBB and LAFB, HTN, known to Dr Acie Fredrickson. 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 On Pro system very useful given travel distance. 16.left low back pain after heavy exertion in Oct, resolved with symptomatic management. 17.problems with FMLA for work and loss of insurance. [facility] staff aware and assisting.  All questions answered. Chemo and neulasta orders confirmed. I will  see her back with treatment 06-18-15, and she knows to call prior to next scheduled appointment if needed. Cc Dr Eula Fried.   Kathleen Bond,Kathleen P, MD   05/28/2015, 11:44 AM

## 2015-05-28 NOTE — Progress Notes (Signed)
I called [employer] and Aaron Edelman is faxing a new form for fmla for the patient.

## 2015-06-01 ENCOUNTER — Other Ambulatory Visit: Payer: Self-pay

## 2015-06-01 DIAGNOSIS — C569 Malignant neoplasm of unspecified ovary: Secondary | ICD-10-CM

## 2015-06-03 ENCOUNTER — Other Ambulatory Visit: Payer: Self-pay | Admitting: Oncology

## 2015-06-03 DIAGNOSIS — Z1509 Genetic susceptibility to other malignant neoplasm: Principal | ICD-10-CM

## 2015-06-03 DIAGNOSIS — Z1501 Genetic susceptibility to malignant neoplasm of breast: Principal | ICD-10-CM

## 2015-06-03 DIAGNOSIS — C562 Malignant neoplasm of left ovary: Secondary | ICD-10-CM

## 2015-06-04 ENCOUNTER — Ambulatory Visit (HOSPITAL_BASED_OUTPATIENT_CLINIC_OR_DEPARTMENT_OTHER): Payer: Self-pay

## 2015-06-04 ENCOUNTER — Encounter: Payer: Self-pay | Admitting: Oncology

## 2015-06-04 ENCOUNTER — Other Ambulatory Visit (HOSPITAL_BASED_OUTPATIENT_CLINIC_OR_DEPARTMENT_OTHER): Payer: Self-pay

## 2015-06-04 ENCOUNTER — Ambulatory Visit: Payer: Self-pay

## 2015-06-04 VITALS — BP 116/77 | HR 73 | Temp 99.0°F | Resp 20

## 2015-06-04 DIAGNOSIS — Z1509 Genetic susceptibility to other malignant neoplasm: Principal | ICD-10-CM

## 2015-06-04 DIAGNOSIS — C562 Malignant neoplasm of left ovary: Secondary | ICD-10-CM

## 2015-06-04 DIAGNOSIS — Z5111 Encounter for antineoplastic chemotherapy: Secondary | ICD-10-CM

## 2015-06-04 DIAGNOSIS — Z1501 Genetic susceptibility to malignant neoplasm of breast: Principal | ICD-10-CM

## 2015-06-04 DIAGNOSIS — C561 Malignant neoplasm of right ovary: Secondary | ICD-10-CM

## 2015-06-04 DIAGNOSIS — Z5189 Encounter for other specified aftercare: Secondary | ICD-10-CM

## 2015-06-04 DIAGNOSIS — Z95828 Presence of other vascular implants and grafts: Secondary | ICD-10-CM

## 2015-06-04 LAB — CBC WITH DIFFERENTIAL/PLATELET
BASO%: 0.6 % (ref 0.0–2.0)
BASOS ABS: 0 10*3/uL (ref 0.0–0.1)
EOS ABS: 0 10*3/uL (ref 0.0–0.5)
EOS%: 0.8 % (ref 0.0–7.0)
HCT: 29.4 % — ABNORMAL LOW (ref 34.8–46.6)
HGB: 9.6 g/dL — ABNORMAL LOW (ref 11.6–15.9)
LYMPH%: 26 % (ref 14.0–49.7)
MCH: 31 pg (ref 25.1–34.0)
MCHC: 32.8 g/dL (ref 31.5–36.0)
MCV: 94.4 fL (ref 79.5–101.0)
MONO#: 0.5 10*3/uL (ref 0.1–0.9)
MONO%: 12.6 % (ref 0.0–14.0)
NEUT%: 60 % (ref 38.4–76.8)
NEUTROS ABS: 2.4 10*3/uL (ref 1.5–6.5)
PLATELETS: 302 10*3/uL (ref 145–400)
RBC: 3.11 10*6/uL — AB (ref 3.70–5.45)
RDW: 15.9 % — ABNORMAL HIGH (ref 11.2–14.5)
WBC: 4 10*3/uL (ref 3.9–10.3)
lymph#: 1 10*3/uL (ref 0.9–3.3)

## 2015-06-04 LAB — COMPREHENSIVE METABOLIC PANEL (CC13)
ALT: <9 U/L (ref 0–55)
ANION GAP: 8 meq/L (ref 3–11)
AST: 10 U/L (ref 5–34)
Albumin: 3.3 g/dL — ABNORMAL LOW (ref 3.5–5.0)
Alkaline Phosphatase: 94 U/L (ref 40–150)
BUN: 12.1 mg/dL (ref 7.0–26.0)
CO2: 25 meq/L (ref 22–29)
Calcium: 9.1 mg/dL (ref 8.4–10.4)
Chloride: 105 mEq/L (ref 98–109)
Creatinine: 0.7 mg/dL (ref 0.6–1.1)
Glucose: 95 mg/dl (ref 70–140)
POTASSIUM: 3.8 meq/L (ref 3.5–5.1)
SODIUM: 139 meq/L (ref 136–145)
TOTAL PROTEIN: 7.1 g/dL (ref 6.4–8.3)

## 2015-06-04 MED ORDER — SODIUM CHLORIDE 0.9 % IV SOLN
600.0000 mg/m2 | Freq: Once | INTRAVENOUS | Status: AC
  Administered 2015-06-04: 950 mg via INTRAVENOUS
  Filled 2015-06-04: qty 24.98

## 2015-06-04 MED ORDER — PROCHLORPERAZINE MALEATE 10 MG PO TABS
ORAL_TABLET | ORAL | Status: AC
  Filled 2015-06-04: qty 1

## 2015-06-04 MED ORDER — SODIUM CHLORIDE 0.9 % IV SOLN
Freq: Once | INTRAVENOUS | Status: AC
  Administered 2015-06-04: 14:00:00 via INTRAVENOUS

## 2015-06-04 MED ORDER — SODIUM CHLORIDE 0.9 % IJ SOLN
10.0000 mL | INTRAMUSCULAR | Status: DC | PRN
  Administered 2015-06-04: 10 mL
  Filled 2015-06-04: qty 10

## 2015-06-04 MED ORDER — ACETAMINOPHEN 325 MG PO TABS
ORAL_TABLET | ORAL | Status: AC
  Filled 2015-06-04: qty 1

## 2015-06-04 MED ORDER — ACETAMINOPHEN 325 MG PO TABS
325.0000 mg | ORAL_TABLET | Freq: Once | ORAL | Status: AC
  Administered 2015-06-04: 325 mg via ORAL

## 2015-06-04 MED ORDER — PROCHLORPERAZINE MALEATE 10 MG PO TABS
10.0000 mg | ORAL_TABLET | Freq: Once | ORAL | Status: AC
  Administered 2015-06-04: 10 mg via ORAL

## 2015-06-04 MED ORDER — PEGFILGRASTIM 6 MG/0.6ML ~~LOC~~ PSKT
6.0000 mg | PREFILLED_SYRINGE | Freq: Once | SUBCUTANEOUS | Status: AC
  Administered 2015-06-04: 6 mg via SUBCUTANEOUS
  Filled 2015-06-04: qty 0.6

## 2015-06-04 MED ORDER — HEPARIN SOD (PORK) LOCK FLUSH 100 UNIT/ML IV SOLN
500.0000 [IU] | Freq: Once | INTRAVENOUS | Status: AC | PRN
  Administered 2015-06-04: 500 [IU]
  Filled 2015-06-04: qty 5

## 2015-06-04 MED ORDER — SODIUM CHLORIDE 0.9 % IJ SOLN
10.0000 mL | INTRAMUSCULAR | Status: DC | PRN
  Administered 2015-06-04: 10 mL via INTRAVENOUS
  Filled 2015-06-04: qty 10

## 2015-06-04 NOTE — Progress Notes (Signed)
I placed new forms on desk of nurse again for fmla [employer].

## 2015-06-04 NOTE — Patient Instructions (Signed)
[facility] Discharge Instructions for Patients Receiving Chemotherapy  Today you received the following chemotherapy agents:  Gemzar  To help prevent nausea and vomiting after your treatment, we encourage you to take your nausea medication as ordered per MD.   If you develop nausea and vomiting that is not controlled by your nausea medication, call the clinic.   BELOW ARE SYMPTOMS THAT SHOULD BE REPORTED IMMEDIATELY:  *FEVER GREATER THAN 100.5 F  *CHILLS WITH OR WITHOUT FEVER  NAUSEA AND VOMITING THAT IS NOT CONTROLLED WITH YOUR NAUSEA MEDICATION  *UNUSUAL SHORTNESS OF BREATH  *UNUSUAL BRUISING OR BLEEDING  TENDERNESS IN MOUTH AND THROAT WITH OR WITHOUT PRESENCE OF ULCERS  *URINARY PROBLEMS  *BOWEL PROBLEMS  UNUSUAL RASH Items with * indicate a potential emergency and should be followed up as soon as possible.  Feel free to call the clinic you have any questions or concerns. The clinic phone number is [phone].  Please show the CHEMO ALERT CARD at check-in to the Emergency Department and triage nurse.   

## 2015-06-04 NOTE — Progress Notes (Signed)
I called and spoke to [facility] and he is emailing me new form with the fitness form(see prev notes Juliann Pulse faxed)--not sure if other fmla was with it??

## 2015-06-05 LAB — CA 125: CA 125: 96 U/mL — ABNORMAL HIGH (ref <35)

## 2015-06-07 ENCOUNTER — Telehealth: Payer: Self-pay

## 2015-06-07 NOTE — Telephone Encounter (Signed)
-----   Message from Gordy Levan, MD sent at 06/05/2015  1:45 PM EST ----- Labs seen and need follow up: please let her know marker lower at 96

## 2015-06-07 NOTE — Telephone Encounter (Signed)
Told Ms. Kathleen Bond the results of the CA-125 from 06-04-15 as noted below by Dr. Marko Plume.

## 2015-06-17 ENCOUNTER — Other Ambulatory Visit: Payer: Self-pay | Admitting: Oncology

## 2015-06-18 ENCOUNTER — Ambulatory Visit: Payer: Self-pay

## 2015-06-18 ENCOUNTER — Ambulatory Visit (HOSPITAL_BASED_OUTPATIENT_CLINIC_OR_DEPARTMENT_OTHER): Payer: Self-pay

## 2015-06-18 ENCOUNTER — Telehealth: Payer: Self-pay | Admitting: Oncology

## 2015-06-18 ENCOUNTER — Ambulatory Visit (HOSPITAL_BASED_OUTPATIENT_CLINIC_OR_DEPARTMENT_OTHER): Payer: Self-pay | Admitting: Oncology

## 2015-06-18 ENCOUNTER — Encounter: Payer: Self-pay | Admitting: Oncology

## 2015-06-18 ENCOUNTER — Other Ambulatory Visit: Payer: Self-pay | Admitting: *Deleted

## 2015-06-18 ENCOUNTER — Other Ambulatory Visit (HOSPITAL_BASED_OUTPATIENT_CLINIC_OR_DEPARTMENT_OTHER): Payer: Self-pay

## 2015-06-18 VITALS — BP 119/88 | HR 78 | Temp 98.1°F | Resp 18 | Ht 62.0 in | Wt 130.7 lb

## 2015-06-18 DIAGNOSIS — Z1501 Genetic susceptibility to malignant neoplasm of breast: Principal | ICD-10-CM

## 2015-06-18 DIAGNOSIS — C569 Malignant neoplasm of unspecified ovary: Secondary | ICD-10-CM

## 2015-06-18 DIAGNOSIS — Z5111 Encounter for antineoplastic chemotherapy: Secondary | ICD-10-CM

## 2015-06-18 DIAGNOSIS — Z853 Personal history of malignant neoplasm of breast: Secondary | ICD-10-CM

## 2015-06-18 DIAGNOSIS — C561 Malignant neoplasm of right ovary: Secondary | ICD-10-CM

## 2015-06-18 DIAGNOSIS — R180 Malignant ascites: Secondary | ICD-10-CM

## 2015-06-18 DIAGNOSIS — D6481 Anemia due to antineoplastic chemotherapy: Secondary | ICD-10-CM

## 2015-06-18 DIAGNOSIS — Z95828 Presence of other vascular implants and grafts: Secondary | ICD-10-CM

## 2015-06-18 DIAGNOSIS — D500 Iron deficiency anemia secondary to blood loss (chronic): Secondary | ICD-10-CM

## 2015-06-18 DIAGNOSIS — Z1509 Genetic susceptibility to other malignant neoplasm: Principal | ICD-10-CM

## 2015-06-18 DIAGNOSIS — D509 Iron deficiency anemia, unspecified: Secondary | ICD-10-CM

## 2015-06-18 DIAGNOSIS — C562 Malignant neoplasm of left ovary: Secondary | ICD-10-CM

## 2015-06-18 DIAGNOSIS — T451X5A Adverse effect of antineoplastic and immunosuppressive drugs, initial encounter: Secondary | ICD-10-CM

## 2015-06-18 DIAGNOSIS — D701 Agranulocytosis secondary to cancer chemotherapy: Secondary | ICD-10-CM

## 2015-06-18 DIAGNOSIS — E039 Hypothyroidism, unspecified: Secondary | ICD-10-CM

## 2015-06-18 LAB — CBC WITH DIFFERENTIAL/PLATELET
BASO%: 0.6 % (ref 0.0–2.0)
Basophils Absolute: 0 10*3/uL (ref 0.0–0.1)
EOS%: 1.2 % (ref 0.0–7.0)
Eosinophils Absolute: 0 10*3/uL (ref 0.0–0.5)
HCT: 28.9 % — ABNORMAL LOW (ref 34.8–46.6)
HGB: 9.4 g/dL — ABNORMAL LOW (ref 11.6–15.9)
LYMPH%: 30.4 % (ref 14.0–49.7)
MCH: 30.9 pg (ref 25.1–34.0)
MCHC: 32.5 g/dL (ref 31.5–36.0)
MCV: 95.1 fL (ref 79.5–101.0)
MONO#: 0.6 10*3/uL (ref 0.1–0.9)
MONO%: 17 % — ABNORMAL HIGH (ref 0.0–14.0)
NEUT%: 50.8 % (ref 38.4–76.8)
NEUTROS ABS: 1.7 10*3/uL (ref 1.5–6.5)
Platelets: 203 10*3/uL (ref 145–400)
RBC: 3.04 10*6/uL — AB (ref 3.70–5.45)
RDW: 18 % — ABNORMAL HIGH (ref 11.2–14.5)
WBC: 3.4 10*3/uL — AB (ref 3.9–10.3)
lymph#: 1 10*3/uL (ref 0.9–3.3)

## 2015-06-18 LAB — COMPREHENSIVE METABOLIC PANEL (CC13)
ALT: 14 U/L (ref 0–55)
ANION GAP: 8 meq/L (ref 3–11)
AST: 13 U/L (ref 5–34)
Albumin: 3.6 g/dL (ref 3.5–5.0)
Alkaline Phosphatase: 93 U/L (ref 40–150)
BUN: 15.6 mg/dL (ref 7.0–26.0)
CHLORIDE: 105 meq/L (ref 98–109)
CO2: 25 meq/L (ref 22–29)
CREATININE: 0.7 mg/dL (ref 0.6–1.1)
Calcium: 9 mg/dL (ref 8.4–10.4)
EGFR: >90 mL/min/{1.73_m2} (ref >90)
GLUCOSE: 98 mg/dL (ref 70–140)
Potassium: 3.7 mEq/L (ref 3.5–5.1)
SODIUM: 138 meq/L (ref 136–145)
TOTAL PROTEIN: 7.3 g/dL (ref 6.4–8.3)

## 2015-06-18 LAB — IRON AND TIBC CHCC
%SAT: 20 % — ABNORMAL LOW (ref 21–57)
Iron: 57 ug/dL (ref 41–142)
TIBC: 280 ug/dL (ref 236–444)
UIBC: 223 ug/dL (ref 120–384)

## 2015-06-18 MED ORDER — ACETAMINOPHEN 325 MG PO TABS
325.0000 mg | ORAL_TABLET | Freq: Once | ORAL | Status: AC
  Administered 2015-06-18: 325 mg via ORAL

## 2015-06-18 MED ORDER — HEPARIN SOD (PORK) LOCK FLUSH 100 UNIT/ML IV SOLN
500.0000 [IU] | Freq: Once | INTRAVENOUS | Status: AC | PRN
  Administered 2015-06-18: 500 [IU]
  Filled 2015-06-18: qty 5

## 2015-06-18 MED ORDER — PROCHLORPERAZINE MALEATE 10 MG PO TABS
10.0000 mg | ORAL_TABLET | Freq: Once | ORAL | Status: AC
  Administered 2015-06-18: 10 mg via ORAL

## 2015-06-18 MED ORDER — ACETAMINOPHEN 325 MG PO TABS
ORAL_TABLET | ORAL | Status: AC
  Filled 2015-06-18: qty 2

## 2015-06-18 MED ORDER — SODIUM CHLORIDE 0.9 % IJ SOLN
10.0000 mL | INTRAMUSCULAR | Status: DC | PRN
  Administered 2015-06-18: 10 mL via INTRAVENOUS
  Filled 2015-06-18: qty 10

## 2015-06-18 MED ORDER — PEGFILGRASTIM 6 MG/0.6ML ~~LOC~~ PSKT
6.0000 mg | PREFILLED_SYRINGE | Freq: Once | SUBCUTANEOUS | Status: AC
  Administered 2015-06-18: 6 mg via SUBCUTANEOUS
  Filled 2015-06-18: qty 0.6

## 2015-06-18 MED ORDER — SODIUM CHLORIDE 0.9 % IV SOLN
600.0000 mg/m2 | Freq: Once | INTRAVENOUS | Status: AC
  Administered 2015-06-18: 950 mg via INTRAVENOUS
  Filled 2015-06-18: qty 24.98

## 2015-06-18 MED ORDER — PROCHLORPERAZINE MALEATE 10 MG PO TABS
ORAL_TABLET | ORAL | Status: AC
  Filled 2015-06-18: qty 1

## 2015-06-18 MED ORDER — SODIUM CHLORIDE 0.9 % IJ SOLN
10.0000 mL | INTRAMUSCULAR | Status: DC | PRN
  Administered 2015-06-18: 10 mL
  Filled 2015-06-18: qty 10

## 2015-06-18 MED ORDER — SODIUM CHLORIDE 0.9 % IV SOLN
Freq: Once | INTRAVENOUS | Status: AC
  Administered 2015-06-18: 13:00:00 via INTRAVENOUS

## 2015-06-18 NOTE — Telephone Encounter (Signed)
Gave patient avs report and appointments for December and January  °

## 2015-06-18 NOTE — Progress Notes (Signed)
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 She is due cycle 6 gemzar today, with On Pro neulasta.  She has BRCA 2 mutation, also history of HER 2 + left breast cancer 2005.  Patient continues to tolerate the gemzar reasonably well and is clinically responding, including improvement in CA 125 marker. She had less fatigue after most recent treatment on 06-04-15, and aches with neulasta not severe. She has very little nausea, po intake fine, bowels moving well. She denies bleeding, SOB, bladder symptoms, increased swelling LE, changes in breasts, problems with PAC, fever or symptoms of infection. She has some tenderness at upper portion of ventral abdominal scar to direct pressure, no epigastric pain.  Remainder of 10 point Review of Systems negative/ unchanged.     PAC in  BRCA 2 mutation (p.[id number]*) by [employer] 10-12-14 Flu vaccine done 04-02-15 CA 125 on 04-09-15 baseline for gemzar 313, down to 96 as of 06-04-15.  She enjoyed Thanksgiving with family.  ONCOLOGIC HISTORY  Left ovarian cancer: Patient became symptomatic with abdominal swelling and constipation in late Nov 2015, seen and treated initially for what was thought to be ulcer. Symptoms continued, with early satiety causing poor po intake; she was seen again at [facility] ED in early Jan 2016, with CT reportedly consistent with stage IIIC or IV gyn cancer, including omental caking, large pelvic mass, para-aortic adenopathy, ascites, pleural effusions and right subpleural nodule. She was seen in consultation by Dr Denman George on 07-25-14, with concern from her exam and scan for  rectal involvement. 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 She had paracentesis of 3.4 liters ascites on 07-27-2014, with cytology ([id number]) showing serous carcinoma consistent with gyn primary, ER negative. She had right thoracentesis for 350 cc on 08-11-14, this requested by anesthesia prior to surgery and apparently not sent for cytology. Patient was more comfortable after paracentesis, but could not tell any difference in breathing after thoracentesis (denies SOB prior to that procedure). CA 125 on 08-02-14 was 1162. She was taken to exploratory laparotomy by Dr Denman George on 08-15-14, with 4 liters of ascites removed and biopsy of omentum obtained, with additional debulking not attempted due to surgical findings of entire peritoneal surface replaced by tumor plaque, small intestine coated with tumor and mesentery tethered with tumor nodules, ileum adherent to pelvic mass, dense plaque of tumor on right diaphragm and 10 cm omental cake in LUQ. Pathology of omental biopsy She needed another paracentesis by IR for 1.3 liters ascites on 08-17-14, and had PAC placed by IR also 08-17-14. Day 1 cycle 1 dose dense carboplatin taxol was given in hospital on 08-18-14. After 2 cycles of dose dense regimen, the carboplatin and taxol was changed to every 3 week regimen beginning cycle 3; she had cycle 5 on 11-09-14, with gCSF support. She had interval debulking by Dr Denman George on 11-28-14. Surgery was exploratory laparotomy with TAH/ BSO, omentectomy and radical tumor debulking; R1 resection with residual milial tumor on small bowel at completion of procedure. Tumor involved bilateral adnexa, uterine serosa, omentum and falciform ligament, with + LVSI, no nodes examined. Maximum tumor size was 4.3 cm involving left ovary.  She resumed carboplatin Taxol on 12-21-14, total of 3 cycles thru 02-01-15 (= 3 cycles after interval debulking). Restaging CT CAP 03-12-15 had some  changes concerning for residual or recurrent disease, and CA 125 was elevated to 64 on 03-14-15, having been 6 in July 2016.  Review of prior radiation and chemotherapy (for left breast cancer) significant for use of epirubicin and some unquantified cardiac exposure to radiation. She began gemcitabine 04-09-15. She was neutropenic by 04-16-15. Regimen was changed to every other week gemzar with neulasta support, gemzar dose also decreased beginning cycle 2.  BRCA 2 abnormality identified 10-12-14.  Breast cancer: Left breast biopsy 11-22-2003 ([id number]) invasive carcinoma. Left breast wide excision with sentinel nodes and axillary contents by Dr Benson Norway 01-03-2004 [phone]) poorly differentiated invasive ductal carcinoma 3.0 cm with closest margin 1.7 cm deep, 6 sentinel nodes + 3 additional nodes all negative, ER 0, PR 0, HER 2 3+, Ki67 of 19%. . She received adjuvant chemotherapy by Dr Sonny Dandy with 5FU, epirubicin, cytoxan x 4 cycles from 02-13-2004 thru 04-16-2004, followed by taxol 80 mg/m2 weekly x 11 from 05-07-2004 thru 09-04-2004, and herceptin 05-21-2004 thru 02-25-2005. Antiemetics were Kytril and decadron. She had PAC for the chemotherapy. She had radiation by Dr Tyler Pita in [place]. Last note from Dr Sonny Dandy in this information was 04-2008. Left diagnostic mammogram [facility] 10-25-2014 stable compared with 12-06-13 and priors; bilateral at [facility] 02-13-15. Breast MRI 05-25-15 had no evidence of malignancy there, this medically necessary with BRCA 2 mutation.   Objective:  Vital signs in last 24 hours:  BP 119/88 mmHg  Pulse 78  Temp(Src) 98.1 F (36.7 C) (Oral)  Resp 18  Ht 5' 2"  (1.575 m)  Wt 130 lb 11.2 oz (59.285 kg)  BMI 23.90 kg/m2  SpO2 100% Weight up 1 lb Alert, oriented and appropriate. Ambulatory without difficulty. Looks comfortable, respirations not labored RA  HEENT:PERRL, sclerae not icteric. Mucous membranes pale. Oral mucosa moist without lesions, posterior pharynx  clear.  Neck supple. No JVD.  Lymphatics:no cervical,supraclavicular, axillary or inguinal adenopathy Resp: clear to auscultation bilaterally and normal percussion bilaterally Cardio: regular rate and rhythm. No gallop. GI: soft, nontender, not distended, no mass or organomegaly. Normally active bowel sounds. Surgical incision not remarkable. Musculoskeletal/ Extremities: without pitting edema, cords, tenderness.  Neuro: no increase peripheral neuropathy. Otherwise nonfocal. PSYCH appropriate mood and affect Skin without rash, ecchymosis, petechiae. Nailbeds pale Breasts: right without dominant mass, skin or nipple findings. Left with stable thickening superiorly, no skin or nipple findings, no new areas palpable. Axillae benign. Portacath-without erythema or tenderness  Lab Results:  Results for orders placed or performed in visit on 06/18/15  CBC with Differential  Result Value Ref Range   WBC 3.4 (L) 3.9 - 10.3 10e3/uL   NEUT# 1.7 1.5 - 6.5 10e3/uL   HGB 9.4 (L) 11.6 - 15.9 g/dL   HCT 28.9 (L) 34.8 - 46.6 %   Platelets 203 145 - 400 10e3/uL   MCV 95.1 79.5 - 101.0 fL   MCH 30.9 25.1 - 34.0 pg   MCHC 32.5 31.5 - 36.0 g/dL   RBC 3.04 (L) 3.70 - 5.45 10e6/uL   RDW 18.0 (H) 11.2 - 14.5 %   lymph# 1.0 0.9 - 3.3 10e3/uL   MONO# 0.6 0.1 - 0.9 10e3/uL   Eosinophils Absolute 0.0 0.0 - 0.5 10e3/uL   Basophils Absolute 0.0 0.0 - 0.1 10e3/uL   NEUT% 50.8 38.4 - 76.8 %   LYMPH% 30.4 14.0 - 49.7 %  MONO% 17.0 (H) 0.0 - 14.0 %   EOS% 1.2 0.0 - 7.0 %   BASO% 0.6 0.0 - 2.0 %  Comprehensive metabolic panel (Cmet) - [facility]  Result Value Ref Range   Sodium 138 136 - 145 mEq/L   Potassium 3.7 3.5 - 5.1 mEq/L   Chloride 105 98 - 109 mEq/L   CO2 25 22 - 29 mEq/L   Glucose 98 70 - 140 mg/dl   BUN 15.6 7.0 - 26.0 mg/dL   Creatinine 0.7 0.6 - 1.1 mg/dL   Total Bilirubin <0.30 0.20 - 1.20 mg/dL   Alkaline Phosphatase 93 40 - 150 U/L   AST 13 5 - 34 U/L   ALT 14 0 - 55 U/L   Total Protein 7.3  6.4 - 8.3 g/dL   Albumin 3.6 3.5 - 5.0 g/dL   Calcium 9.0 8.4 - 10.4 mg/dL   Anion Gap 8 3 - 11 mEq/L   EGFR >90 >90 ml/min/1.73 m2    Iron studies drawn from [facility] in infusion today: serum iron 57, %sat 20  Studies/Results:  No results found.  Medications: I have reviewed the patient's current medications. Continue oral iron for now; consider IV iron if no improvement  DISCUSSION Insurance situation is still not clear to patient; messages sent to [facility] social worker and financial counselors re disablilty/ insurance/ assistance.  Assessment/Plan:  1. 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 Rapid recurrence/ progression of gyn cancer within 4 weeks of completing that chemo. Paracentesis 04-06-15 helpful for symptoms and confirmed serous malignancy.Clinically responding  to gemzar, continue every other week with neulasta support, cycle 6 today. 2.BRCA 2 abnormality. 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 Bilateral mammograms at [facility] done 02-13-15. With BRCA 2 mutation, and as she is responding to treatment for the ovarian cancer, breast MRI done 05-25-15, no evidence of active malignancy there. 4.PAC in 5.chemo and iron deficiency anemia: continue oral iron. 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 On Pro system very useful given travel distance.    All questions answered. Chemo and neulasta orders confirmed. Time spent 25 min including >50% counseling and coordination of care.   Abigail Teall P, MD   06/18/2015, 11:15 AM

## 2015-06-18 NOTE — Patient Instructions (Signed)

## 2015-06-18 NOTE — Patient Instructions (Signed)
[facility] Discharge Instructions for Patients Receiving Chemotherapy  Today you received the following chemotherapy agents:  Gemzar  To help prevent nausea and vomiting after your treatment, we encourage you to take your nausea medication as ordered per MD.   If you develop nausea and vomiting that is not controlled by your nausea medication, call the clinic.   BELOW ARE SYMPTOMS THAT SHOULD BE REPORTED IMMEDIATELY:  *FEVER GREATER THAN 100.5 F  *CHILLS WITH OR WITHOUT FEVER  NAUSEA AND VOMITING THAT IS NOT CONTROLLED WITH YOUR NAUSEA MEDICATION  *UNUSUAL SHORTNESS OF BREATH  *UNUSUAL BRUISING OR BLEEDING  TENDERNESS IN MOUTH AND THROAT WITH OR WITHOUT PRESENCE OF ULCERS  *URINARY PROBLEMS  *BOWEL PROBLEMS  UNUSUAL RASH Items with * indicate a potential emergency and should be followed up as soon as possible.  Feel free to call the clinic you have any questions or concerns. The clinic phone number is [phone].  Please show the CHEMO ALERT CARD at check-in to the Emergency Department and triage nurse.   

## 2015-06-19 ENCOUNTER — Encounter: Payer: Self-pay | Admitting: Oncology

## 2015-06-19 NOTE — Progress Notes (Signed)
Contacted pt to follow up on insurance and financial questions. Asked pt if she was currently employed. Pt states she has not worked since January and has been on disability since July of this year. Pt states she was originally scheduled to return to work on 05/21/15 but info was faxed to have extended. She states she has enrolled in new insurance through [employer] which will be in effect on January 1 therefore will be uninsured November 1-December 31. Advised pt that she can apply for [facility] and possible drug replacement. Pt will bring proof of income for both on 07/02/15.

## 2015-06-20 ENCOUNTER — Other Ambulatory Visit: Payer: Self-pay | Admitting: Oncology

## 2015-06-29 ENCOUNTER — Other Ambulatory Visit: Payer: Self-pay

## 2015-06-29 DIAGNOSIS — C569 Malignant neoplasm of unspecified ovary: Secondary | ICD-10-CM

## 2015-07-02 ENCOUNTER — Ambulatory Visit: Payer: Self-pay

## 2015-07-02 ENCOUNTER — Other Ambulatory Visit (HOSPITAL_BASED_OUTPATIENT_CLINIC_OR_DEPARTMENT_OTHER): Payer: Self-pay

## 2015-07-02 ENCOUNTER — Ambulatory Visit (HOSPITAL_BASED_OUTPATIENT_CLINIC_OR_DEPARTMENT_OTHER): Payer: Self-pay

## 2015-07-02 ENCOUNTER — Encounter: Payer: Self-pay | Admitting: Oncology

## 2015-07-02 VITALS — BP 100/60 | HR 72 | Temp 98.2°F | Resp 17

## 2015-07-02 DIAGNOSIS — C561 Malignant neoplasm of right ovary: Secondary | ICD-10-CM

## 2015-07-02 DIAGNOSIS — Z95828 Presence of other vascular implants and grafts: Secondary | ICD-10-CM

## 2015-07-02 DIAGNOSIS — Z1501 Genetic susceptibility to malignant neoplasm of breast: Principal | ICD-10-CM

## 2015-07-02 DIAGNOSIS — C562 Malignant neoplasm of left ovary: Secondary | ICD-10-CM

## 2015-07-02 DIAGNOSIS — Z1509 Genetic susceptibility to other malignant neoplasm: Principal | ICD-10-CM

## 2015-07-02 DIAGNOSIS — C569 Malignant neoplasm of unspecified ovary: Secondary | ICD-10-CM

## 2015-07-02 DIAGNOSIS — Z5111 Encounter for antineoplastic chemotherapy: Secondary | ICD-10-CM

## 2015-07-02 DIAGNOSIS — D6481 Anemia due to antineoplastic chemotherapy: Secondary | ICD-10-CM

## 2015-07-02 LAB — CBC WITH DIFFERENTIAL/PLATELET
BASO%: 0.4 % (ref 0.0–2.0)
Basophils Absolute: 0 10*3/uL (ref 0.0–0.1)
EOS%: 0.6 % (ref 0.0–7.0)
Eosinophils Absolute: 0 10*3/uL (ref 0.0–0.5)
HEMATOCRIT: 29.3 % — AB (ref 34.8–46.6)
HEMOGLOBIN: 9.6 g/dL — AB (ref 11.6–15.9)
LYMPH#: 1.5 10*3/uL (ref 0.9–3.3)
LYMPH%: 31.8 % (ref 14.0–49.7)
MCH: 31.6 pg (ref 25.1–34.0)
MCHC: 32.8 g/dL (ref 31.5–36.0)
MCV: 96.4 fL (ref 79.5–101.0)
MONO#: 0.7 10*3/uL (ref 0.1–0.9)
MONO%: 14.2 % — ABNORMAL HIGH (ref 0.0–14.0)
NEUT%: 53 % (ref 38.4–76.8)
NEUTROS ABS: 2.5 10*3/uL (ref 1.5–6.5)
Platelets: 240 10*3/uL (ref 145–400)
RBC: 3.04 10*6/uL — ABNORMAL LOW (ref 3.70–5.45)
RDW: 20.3 % — AB (ref 11.2–14.5)
WBC: 4.7 10*3/uL (ref 3.9–10.3)

## 2015-07-02 LAB — COMPREHENSIVE METABOLIC PANEL
ALBUMIN: 3.8 g/dL (ref 3.5–5.0)
ALT: 14 U/L (ref 0–55)
AST: 12 U/L (ref 5–34)
Alkaline Phosphatase: 102 U/L (ref 40–150)
Anion Gap: 9 mEq/L (ref 3–11)
BILIRUBIN TOTAL: 0.33 mg/dL (ref 0.20–1.20)
BUN: 19.1 mg/dL (ref 7.0–26.0)
CALCIUM: 9.1 mg/dL (ref 8.4–10.4)
CHLORIDE: 106 meq/L (ref 98–109)
CO2: 23 mEq/L (ref 22–29)
CREATININE: 0.8 mg/dL (ref 0.6–1.1)
EGFR: >90 mL/min/{1.73_m2} (ref >90)
Glucose: 91 mg/dl (ref 70–140)
Potassium: 4.1 mEq/L (ref 3.5–5.1)
Sodium: 139 mEq/L (ref 136–145)
TOTAL PROTEIN: 7.5 g/dL (ref 6.4–8.3)

## 2015-07-02 MED ORDER — SODIUM CHLORIDE 0.9 % IV SOLN
Freq: Once | INTRAVENOUS | Status: AC
  Administered 2015-07-02: 15:00:00 via INTRAVENOUS

## 2015-07-02 MED ORDER — PROCHLORPERAZINE MALEATE 10 MG PO TABS
ORAL_TABLET | ORAL | Status: AC
Start: 2015-07-02 — End: 2015-07-02
  Filled 2015-07-02: qty 1

## 2015-07-02 MED ORDER — SODIUM CHLORIDE 0.9 % IV SOLN
600.0000 mg/m2 | Freq: Once | INTRAVENOUS | Status: AC
  Administered 2015-07-02: 950 mg via INTRAVENOUS
  Filled 2015-07-02: qty 24.98

## 2015-07-02 MED ORDER — ACETAMINOPHEN 325 MG PO TABS
ORAL_TABLET | ORAL | Status: AC
Start: 2015-07-02 — End: 2015-07-02
  Filled 2015-07-02: qty 1

## 2015-07-02 MED ORDER — SODIUM CHLORIDE 0.9 % IJ SOLN
10.0000 mL | INTRAMUSCULAR | Status: DC | PRN
  Administered 2015-07-02: 10 mL via INTRAVENOUS
  Filled 2015-07-02: qty 10

## 2015-07-02 MED ORDER — HEPARIN SOD (PORK) LOCK FLUSH 100 UNIT/ML IV SOLN
500.0000 [IU] | Freq: Once | INTRAVENOUS | Status: AC
  Administered 2015-07-02: 500 [IU] via INTRAVENOUS
  Filled 2015-07-02: qty 5

## 2015-07-02 MED ORDER — SODIUM CHLORIDE 0.9 % IJ SOLN
10.0000 mL | INTRAMUSCULAR | Status: DC | PRN
Start: 2015-07-02 — End: 2015-07-02
  Administered 2015-07-02: 10 mL via INTRAVENOUS
  Filled 2015-07-02: qty 10

## 2015-07-02 MED ORDER — ACETAMINOPHEN 325 MG PO TABS
325.0000 mg | ORAL_TABLET | Freq: Once | ORAL | Status: AC
  Administered 2015-07-02: 325 mg via ORAL

## 2015-07-02 MED ORDER — PROCHLORPERAZINE MALEATE 10 MG PO TABS
10.0000 mg | ORAL_TABLET | Freq: Once | ORAL | Status: AC
  Administered 2015-07-02: 10 mg via ORAL

## 2015-07-02 MED ORDER — PEGFILGRASTIM 6 MG/0.6ML ~~LOC~~ PSKT
6.0000 mg | PREFILLED_SYRINGE | Freq: Once | SUBCUTANEOUS | Status: AC
  Administered 2015-07-02: 6 mg via SUBCUTANEOUS
  Filled 2015-07-02: qty 0.6

## 2015-07-02 NOTE — Progress Notes (Signed)
Approved uninsured pt for [facility] $400 grant. Gave patient information for our outpatient pharmacy for medications written by Dr.Livesay.Pt has a copy of approval as well as expenses it covers. Emailed Adonis Huguenin to see if funds are still available for [employer].Also gave patient application for Medicaid and [place].

## 2015-07-02 NOTE — Patient Instructions (Signed)
[facility] Discharge Instructions for Patients Receiving Chemotherapy  Today you received the following chemotherapy agents Gemzar  To help prevent nausea and vomiting after your treatment, we encourage you to take your nausea medication as directed.    If you develop nausea and vomiting that is not controlled by your nausea medication, call the clinic.   BELOW ARE SYMPTOMS THAT SHOULD BE REPORTED IMMEDIATELY:  *FEVER GREATER THAN 100.5 F  *CHILLS WITH OR WITHOUT FEVER  NAUSEA AND VOMITING THAT IS NOT CONTROLLED WITH YOUR NAUSEA MEDICATION  *UNUSUAL SHORTNESS OF BREATH  *UNUSUAL BRUISING OR BLEEDING  TENDERNESS IN MOUTH AND THROAT WITH OR WITHOUT PRESENCE OF ULCERS  *URINARY PROBLEMS  *BOWEL PROBLEMS  UNUSUAL RASH Items with * indicate a potential emergency and should be followed up as soon as possible.  Feel free to call the clinic you have any questions or concerns. The clinic phone number is [phone].  Please show the CHEMO ALERT CARD at check-in to the Emergency Department and triage nurse.   

## 2015-07-13 ENCOUNTER — Other Ambulatory Visit: Payer: Self-pay | Admitting: Medical Oncology

## 2015-07-13 DIAGNOSIS — C569 Malignant neoplasm of unspecified ovary: Secondary | ICD-10-CM

## 2015-07-16 ENCOUNTER — Other Ambulatory Visit: Payer: Self-pay | Admitting: Oncology

## 2015-07-16 DIAGNOSIS — Z1509 Genetic susceptibility to other malignant neoplasm: Principal | ICD-10-CM

## 2015-07-16 DIAGNOSIS — Z1501 Genetic susceptibility to malignant neoplasm of breast: Principal | ICD-10-CM

## 2015-07-16 DIAGNOSIS — C562 Malignant neoplasm of left ovary: Secondary | ICD-10-CM

## 2015-07-17 ENCOUNTER — Encounter: Payer: Self-pay | Admitting: Oncology

## 2015-07-17 ENCOUNTER — Ambulatory Visit: Payer: Self-pay

## 2015-07-17 ENCOUNTER — Ambulatory Visit (HOSPITAL_BASED_OUTPATIENT_CLINIC_OR_DEPARTMENT_OTHER): Payer: Self-pay

## 2015-07-17 ENCOUNTER — Ambulatory Visit (HOSPITAL_BASED_OUTPATIENT_CLINIC_OR_DEPARTMENT_OTHER): Payer: Self-pay | Admitting: Oncology

## 2015-07-17 ENCOUNTER — Other Ambulatory Visit (HOSPITAL_BASED_OUTPATIENT_CLINIC_OR_DEPARTMENT_OTHER): Payer: Self-pay

## 2015-07-17 VITALS — BP 96/70 | HR 76 | Temp 98.9°F | Resp 18 | Ht 62.0 in | Wt 132.6 lb

## 2015-07-17 DIAGNOSIS — Z5189 Encounter for other specified aftercare: Secondary | ICD-10-CM

## 2015-07-17 DIAGNOSIS — Z853 Personal history of malignant neoplasm of breast: Secondary | ICD-10-CM

## 2015-07-17 DIAGNOSIS — Z95828 Presence of other vascular implants and grafts: Secondary | ICD-10-CM

## 2015-07-17 DIAGNOSIS — Z1509 Genetic susceptibility to other malignant neoplasm: Principal | ICD-10-CM

## 2015-07-17 DIAGNOSIS — C569 Malignant neoplasm of unspecified ovary: Secondary | ICD-10-CM

## 2015-07-17 DIAGNOSIS — C562 Malignant neoplasm of left ovary: Secondary | ICD-10-CM

## 2015-07-17 DIAGNOSIS — Z5111 Encounter for antineoplastic chemotherapy: Secondary | ICD-10-CM

## 2015-07-17 DIAGNOSIS — Z1501 Genetic susceptibility to malignant neoplasm of breast: Secondary | ICD-10-CM

## 2015-07-17 LAB — COMPREHENSIVE METABOLIC PANEL WITH GFR
ALT: 12 U/L (ref 0–55)
AST: 12 U/L (ref 5–34)
Albumin: 3.7 g/dL (ref 3.5–5.0)
Alkaline Phosphatase: 103 U/L (ref 40–150)
Anion Gap: 9 meq/L (ref 3–11)
BUN: 18.9 mg/dL (ref 7.0–26.0)
CO2: 24 meq/L (ref 22–29)
Calcium: 9.1 mg/dL (ref 8.4–10.4)
Chloride: 106 meq/L (ref 98–109)
Creatinine: 0.7 mg/dL (ref 0.6–1.1)
EGFR: >90 ml/min/1.73 m2 (ref >90)
Glucose: 94 mg/dL (ref 70–140)
Potassium: 4 meq/L (ref 3.5–5.1)
Sodium: 140 meq/L (ref 136–145)
Total Bilirubin: <0.3 mg/dL (ref 0.20–1.20)
Total Protein: 7.4 g/dL (ref 6.4–8.3)

## 2015-07-17 LAB — CBC WITH DIFFERENTIAL/PLATELET
BASO%: 0.5 % (ref 0.0–2.0)
BASOS ABS: 0 10*3/uL (ref 0.0–0.1)
EOS ABS: 0 10*3/uL (ref 0.0–0.5)
EOS%: 0.3 % (ref 0.0–7.0)
HEMATOCRIT: 29 % — AB (ref 34.8–46.6)
HGB: 9.4 g/dL — ABNORMAL LOW (ref 11.6–15.9)
LYMPH#: 1.2 10*3/uL (ref 0.9–3.3)
LYMPH%: 22.3 % (ref 14.0–49.7)
MCH: 31.8 pg (ref 25.1–34.0)
MCHC: 32.3 g/dL (ref 31.5–36.0)
MCV: 98.6 fL (ref 79.5–101.0)
MONO#: 0.6 10*3/uL (ref 0.1–0.9)
MONO%: 11.8 % (ref 0.0–14.0)
NEUT#: 3.6 10*3/uL (ref 1.5–6.5)
NEUT%: 65.1 % (ref 38.4–76.8)
PLATELETS: 263 10*3/uL (ref 145–400)
RBC: 2.94 10*6/uL — AB (ref 3.70–5.45)
RDW: 21.4 % — AB (ref 11.2–14.5)
WBC: 5.5 10*3/uL (ref 3.9–10.3)

## 2015-07-17 MED ORDER — LORAZEPAM 0.5 MG PO TABS: 0.5000 mg | ORAL_TABLET | Freq: Four times a day (QID) | ORAL | Status: DC | PRN

## 2015-07-17 MED ORDER — ONDANSETRON HCL 8 MG PO TABS: 8.0000 mg | ORAL_TABLET | Freq: Three times a day (TID) | ORAL | Status: DC | PRN

## 2015-07-17 MED ORDER — SODIUM CHLORIDE 0.9 % IJ SOLN
10.0000 mL | INTRAMUSCULAR | Status: DC | PRN
  Administered 2015-07-17: 10 mL via INTRAVENOUS
  Filled 2015-07-17: qty 10

## 2015-07-17 MED ORDER — PANTOPRAZOLE SODIUM 40 MG PO TBEC: 40.0000 mg | DELAYED_RELEASE_TABLET | Freq: Every day | ORAL | Status: DC

## 2015-07-17 MED ORDER — PEGFILGRASTIM 6 MG/0.6ML ~~LOC~~ PSKT
6.0000 mg | PREFILLED_SYRINGE | Freq: Once | SUBCUTANEOUS | Status: AC
  Administered 2015-07-17: 6 mg via SUBCUTANEOUS
  Filled 2015-07-17: qty 0.6

## 2015-07-17 MED ORDER — ACETAMINOPHEN 325 MG PO TABS
325.0000 mg | ORAL_TABLET | Freq: Once | ORAL | Status: AC
  Administered 2015-07-17: 325 mg via ORAL

## 2015-07-17 MED ORDER — PROCHLORPERAZINE MALEATE 10 MG PO TABS
ORAL_TABLET | ORAL | Status: AC
  Filled 2015-07-17: qty 1

## 2015-07-17 MED ORDER — PROCHLORPERAZINE MALEATE 10 MG PO TABS
10.0000 mg | ORAL_TABLET | Freq: Once | ORAL | Status: AC
  Administered 2015-07-17: 10 mg via ORAL

## 2015-07-17 MED ORDER — PROCHLORPERAZINE MALEATE 10 MG PO TABS: 10.0000 mg | ORAL_TABLET | Freq: Four times a day (QID) | ORAL | Status: DC | PRN

## 2015-07-17 MED ORDER — GEMCITABINE HCL CHEMO INJECTION 1 GM/26.3ML
600.0000 mg/m2 | Freq: Once | INTRAVENOUS | Status: AC
  Administered 2015-07-17: 950 mg via INTRAVENOUS
  Filled 2015-07-17: qty 24.98

## 2015-07-17 MED ORDER — SODIUM CHLORIDE 0.9 % IV SOLN
Freq: Once | INTRAVENOUS | Status: AC
  Administered 2015-07-17: 14:00:00 via INTRAVENOUS

## 2015-07-17 MED ORDER — ACETAMINOPHEN 325 MG PO TABS
ORAL_TABLET | ORAL | Status: AC
  Filled 2015-07-17: qty 1

## 2015-07-17 NOTE — Progress Notes (Signed)
I faxed [employer] application for possible asst with gemzar for patient. I entered in sharepoint and will send to medical records

## 2015-07-17 NOTE — Patient Instructions (Signed)

## 2015-07-17 NOTE — Progress Notes (Signed)
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 This regimen was begun 04-09-15, with on pro neulasta support since neutropenic following cycle 1. Patient is tolerating treatment generally well and has had good clinical response. 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 She does not have fever or flu-like aches after gemzar. Taste is nearly back to normal and she is now eating well, without significant nausea and no vomiting. She feels some soreness deep in anterior abdomen when she turns in bed, very brief and resolves without intervention. Bowels are moving ~ 2x daily with soft stool. She denies bleeding, new or different pain otherwise, symptoms of infection, LE swelling, SOB or other respiratory symptoms, changes in breasts.No problems with PAC. Denies chest pain or orthostatic symptoms. Remainder of 10 point Review of Systems negative/ unchanged.    PAC in  BRCA 2 mutation (p.[id number]*) by [employer] 10-12-14 Flu vaccine done 04-02-15 CA 125 on 04-09-15 baseline for gemzar 313, down to 96 as of 06-04-15 and 30 after visit today.  ONCOLOGIC HISTORY Left ovarian cancer: Patient became symptomatic with abdominal swelling and constipation in late Nov 2015, seen and treated initially for what was thought to be ulcer. Symptoms continued, with early satiety causing poor po intake; she was seen again at [facility] ED in early Jan 2016, with CT reportedly consistent with stage IIIC or IV gyn cancer, including omental caking, large pelvic mass, para-aortic adenopathy, ascites, pleural  effusions and right subpleural nodule. She was seen in consultation by Dr Denman George on 07-25-14, with concern from her exam and scan for rectal involvement. 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 She had paracentesis of 3.4 liters ascites on 07-27-2014, with cytology ([id number]) showing serous carcinoma consistent with gyn primary, ER negative. She had right thoracentesis for 350 cc on 08-11-14, this requested by anesthesia prior to surgery and apparently not sent for cytology. Patient was more comfortable after paracentesis, but could not tell any difference in breathing after thoracentesis (denies SOB prior to that procedure). CA 125 on 08-02-14 was 1162. She was taken to exploratory laparotomy by Dr Denman George on 08-15-14, with 4 liters of ascites removed and biopsy of omentum obtained, with additional debulking not attempted due to surgical findings of entire peritoneal surface replaced by tumor plaque, small intestine coated with tumor and mesentery tethered with tumor nodules, ileum adherent to pelvic mass, dense plaque of tumor on right diaphragm and 10 cm omental cake in LUQ. Pathology of omental biopsy She needed another paracentesis by IR for 1.3 liters ascites on 08-17-14, and had PAC placed by IR also 08-17-14. Day 1 cycle 1 dose dense carboplatin taxol was given in hospital on 08-18-14. After 2 cycles of dose dense regimen, the carboplatin and taxol was changed to every 3 week regimen beginning cycle 3; she had cycle 5 on 11-09-14, with gCSF support. She had interval debulking by Dr Denman George on 11-28-14. Surgery was exploratory laparotomy with TAH/ BSO, omentectomy and radical tumor debulking; R1 resection with residual milial tumor on small bowel at completion of procedure. Tumor  involved bilateral adnexa, uterine serosa, omentum and falciform ligament, with + LVSI, no nodes examined. Maximum tumor size was 4.3 cm involving left ovary. She resumed  carboplatin Taxol on 12-21-14, total of 3 cycles thru 02-01-15 (= 3 cycles after interval debulking). Restaging CT CAP 03-12-15 had some changes concerning for residual or recurrent disease, and CA 125 was elevated to 64 on 03-14-15, having been 6 in July 2016.  Review of prior radiation and chemotherapy (for left breast cancer) significant for use of epirubicin and some unquantified cardiac exposure to radiation. She began gemcitabine 04-09-15. She was neutropenic by 04-16-15. Regimen was changed to every other week gemzar with neulasta support, gemzar dose also decreased beginning cycle 2.  BRCA 2 abnormality identified 10-12-14.  Breast cancer: Left breast biopsy 11-22-2003 ([id number]) invasive carcinoma. Left breast wide excision with sentinel nodes and axillary contents by Dr Benson Norway 01-03-2004 [phone]) poorly differentiated invasive ductal carcinoma 3.0 cm with closest margin 1.7 cm deep, 6 sentinel nodes + 3 additional nodes all negative, ER 0, PR 0, HER 2 3+, Ki67 of 19%. . She received adjuvant chemotherapy by Dr Sonny Dandy with 5FU, epirubicin, cytoxan x 4 cycles from 02-13-2004 thru 04-16-2004, followed by taxol 80 mg/m2 weekly x 11 from 05-07-2004 thru 09-04-2004, and herceptin 05-21-2004 thru 02-25-2005. Antiemetics were Kytril and decadron. She had PAC for the chemotherapy. She had radiation by Dr Tyler Pita in [place]. Last note from Dr Sonny Dandy in this information was 04-2008. Left diagnostic mammogram [facility] 10-25-2014 stable compared with 12-06-13 and priors; bilateral at [facility] 02-13-15. Breast MRI 05-25-15 had no evidence of malignancy there, this medically necessary with BRCA 2 mutation.   Objective:  Vital signs in last 24 hours:  BP 96/70 mmHg  Pulse 76  Temp(Src) 98.9 F (37.2 C) (Oral)  Resp 18  Ht _0  (1.575 m)  Wt 132 lb 9.6 oz (60.147 kg)  BMI 24.25 kg/m2  SpO2 100% Weight up 2 lbs. Looks comfortable, very pleasant as always. Respirations not labored RA Alert, oriented and  appropriate. Ambulatory without difficulty.  No alopecia  HEENT:PERRL, sclerae not icteric. Oral mucosa moist without lesions, posterior pharynx clear.  Neck supple. No JVD.  Lymphatics:no cervical,supraclavicular, axillary or inguinal adenopathy Resp: clear to auscultation bilaterally and normal percussion bilaterally Cardio: regular rate and rhythm. No gallop. GI: soft, nontender including anterior lower abdomen, not distended, no mass or organomegaly. Normally active bowel sounds. Surgical incision not remarkable. Musculoskeletal/ Extremities: without pitting edema, cords, tenderness Neuro: no change peripheral neuropathy. Otherwise nonfocal. PSYCH appropriate mood and affect Skin without rash, ecchymosis, petechiae Breasts: thickened area superior left breast seems a little less prominent, otherwise bilaterally without dominant mass, skin or nipple findings. Axillae benign. Portacath-without erythema or tenderness  Lab Results:  Results for orders placed or performed in visit on 07/17/15  CBC with Differential  Result Value Ref Range   WBC 5.5 3.9 - 10.3 10e3/uL   NEUT# 3.6 1.5 - 6.5 10e3/uL   HGB 9.4 (L) 11.6 - 15.9 g/dL   HCT 29.0 (L) 34.8 - 46.6 %   Platelets 263 145 - 400 10e3/uL   MCV 98.6 79.5 - 101.0 fL   MCH 31.8 25.1 - 34.0 pg   MCHC 32.3 31.5 - 36.0 g/dL   RBC 2.94 (L) 3.70 - 5.45 10e6/uL   RDW 21.4 (H) 11.2 - 14.5 %   lymph# 1.2 0.9 - 3.3 10e3/uL   MONO# 0.6 0.1 - 0.9 10e3/uL   Eosinophils Absolute 0.0 0.0 - 0.5 10e3/uL  Basophils Absolute 0.0 0.0 - 0.1 10e3/uL   NEUT% 65.1 38.4 - 76.8 %   LYMPH% 22.3 14.0 - 49.7 %   MONO% 11.8 0.0 - 14.0 %   EOS% 0.3 0.0 - 7.0 %   BASO% 0.5 0.0 - 2.0 %  Comprehensive metabolic panel  Result Value Ref Range   Sodium 140 136 - 145 mEq/L   Potassium 4.0 3.5 - 5.1 mEq/L   Chloride 106 98 - 109 mEq/L   CO2 24 22 - 29 mEq/L   Glucose 94 70 - 140 mg/dl   BUN 18.9 7.0 - 26.0 mg/dL   Creatinine 0.7 0.6 - 1.1 mg/dL   Total  Bilirubin <0.30 0.20 - 1.20 mg/dL   Alkaline Phosphatase 103 40 - 150 U/L   AST 12 5 - 34 U/L   ALT 12 0 - 55 U/L   Total Protein 7.4 6.4 - 8.3 g/dL   Albumin 3.7 3.5 - 5.0 g/dL   Calcium 9.1 8.4 - 10.4 mg/dL   Anion Gap 9 3 - 11 mEq/L   EGFR >90 >90 ml/min/1.73 m2  CA 125  Result Value Ref Range   CA 125 30 <35 U/mL   CA 125 resulted after visit and will be communicated to patient  Studies/Results:  No results found.  Medications: I have reviewed the patient's current medications. Meds still list vasotec 10 mg and lasix at hs, which we will ask her to hold given BP only 96/70 today  DISCUSSION:  patient is pleased to be tolerating treatment and feeling better overall, in full agreement with continuing. She understands that there is not a top cumulative dose of gemzar.  She does not have insurance coverage now. She has met with financial counselor and Education officer, museum, has gotten IT sales professional at [employer], has made [employer]. I have completed forms now for pharmaceutical assistance with gemzar.   Assessment/Plan:  1. 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 Rapid recurrence/ progression of gyn cancer within 4 weeks of completing that chemo. Paracentesis 04-06-15 helpful for symptoms and confirmed serous malignancy.Clinically responding to gemzar including CA125 now in normal range. Continue every other week gemzar with neulasta support. She prefers treatment on Mondays if possible. 2.BRCA 2 abnormality. 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 [facility] done [facility]13[facility]15. With BRCA 2 mutation, and as she is responding to treatment for  the ovarian cancer, breast MRI done 05-25-15, no evidence of active malignancy there. 4.PAC in 5.chemo and iron deficiency anemia: continue oral iron. 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 Previous epirubicin, some cardiac exposure from left breast radiation. BP lower last several visits, including systolic only 97 today. WIll ask her to hold vasotec and lasix if she has still been using these. 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 [employer] system very useful given travel distance.  All questions answered. Chemo and neulasta orders confirmed. Time spent 25 min including >50% counseling and coordination of  care. CC Dr Rocco Pauls, MD   07/18/2015, 7:18 PM

## 2015-07-17 NOTE — Progress Notes (Signed)
Forms placed on desk of nurse for dr. Marko Plume.

## 2015-07-17 NOTE — Progress Notes (Signed)
I placed application--Lilly one -Patient one for poss asst with Gemzar. No insurance Nov-Dec(see prev notes).

## 2015-07-17 NOTE — Patient Instructions (Signed)
[facility] Discharge Instructions for Patients Receiving Chemotherapy  Today you received the following chemotherapy agents:  Gemzar  To help prevent nausea and vomiting after your treatment, we encourage you to take your nausea medication as ordered per MD.   If you develop nausea and vomiting that is not controlled by your nausea medication, call the clinic.   BELOW ARE SYMPTOMS THAT SHOULD BE REPORTED IMMEDIATELY:  *FEVER GREATER THAN 100.5 F  *CHILLS WITH OR WITHOUT FEVER  NAUSEA AND VOMITING THAT IS NOT CONTROLLED WITH YOUR NAUSEA MEDICATION  *UNUSUAL SHORTNESS OF BREATH  *UNUSUAL BRUISING OR BLEEDING  TENDERNESS IN MOUTH AND THROAT WITH OR WITHOUT PRESENCE OF ULCERS  *URINARY PROBLEMS  *BOWEL PROBLEMS  UNUSUAL RASH Items with * indicate a potential emergency and should be followed up as soon as possible.  Feel free to call the clinic you have any questions or concerns. The clinic phone number is [phone].  Please show the CHEMO ALERT CARD at check-in to the Emergency Department and triage nurse.   

## 2015-07-18 LAB — CA 125: CA 125: 30 U/mL (ref <35)

## 2015-07-19 ENCOUNTER — Telehealth: Payer: Self-pay

## 2015-07-19 NOTE — Telephone Encounter (Signed)
-----   Message from Gordy Levan, MD sent at 07/18/2015  4:31 PM EST ----- Labs seen and need follow up: please let her know marker is down to 30!

## 2015-07-19 NOTE — Telephone Encounter (Signed)
Told Ms. Kathleen Bond the result of the CA 125 as noted below by Dr. Marko Plume  As well as holding lasix and vasotec.  Ms. Kathleen Bond verbalized understanding.

## 2015-07-19 NOTE — Telephone Encounter (Signed)
Patient Demographics     Patient Name Sex DOB SSN Address Contact Numbers    Kathleen Bond, Kathleen Bond Female 11/24/1950 SSN-[id number] [address] [place] [address] [phone]      Message  Received: Norman Herrlich, MD  Baruch Merl, RN           BP was lower on 12-27. Please have her hold vasotec and lasix.

## 2015-07-30 ENCOUNTER — Telehealth: Payer: Self-pay | Admitting: Oncology

## 2015-07-30 ENCOUNTER — Ambulatory Visit: Payer: Self-pay

## 2015-07-30 ENCOUNTER — Other Ambulatory Visit (HOSPITAL_BASED_OUTPATIENT_CLINIC_OR_DEPARTMENT_OTHER): Payer: Self-pay

## 2015-07-30 ENCOUNTER — Ambulatory Visit (HOSPITAL_BASED_OUTPATIENT_CLINIC_OR_DEPARTMENT_OTHER): Payer: Self-pay

## 2015-07-30 VITALS — BP 104/71 | HR 91 | Temp 98.6°F | Resp 20

## 2015-07-30 DIAGNOSIS — C561 Malignant neoplasm of right ovary: Secondary | ICD-10-CM

## 2015-07-30 DIAGNOSIS — Z1501 Genetic susceptibility to malignant neoplasm of breast: Principal | ICD-10-CM

## 2015-07-30 DIAGNOSIS — Z95828 Presence of other vascular implants and grafts: Secondary | ICD-10-CM

## 2015-07-30 DIAGNOSIS — C562 Malignant neoplasm of left ovary: Secondary | ICD-10-CM

## 2015-07-30 DIAGNOSIS — Z5111 Encounter for antineoplastic chemotherapy: Secondary | ICD-10-CM

## 2015-07-30 DIAGNOSIS — Z5189 Encounter for other specified aftercare: Secondary | ICD-10-CM

## 2015-07-30 DIAGNOSIS — Z1509 Genetic susceptibility to other malignant neoplasm: Principal | ICD-10-CM

## 2015-07-30 LAB — CBC WITH DIFFERENTIAL/PLATELET
BASO%: 0.2 % (ref 0.0–2.0)
Basophils Absolute: 0 10*3/uL (ref 0.0–0.1)
EOS ABS: 0 10*3/uL (ref 0.0–0.5)
EOS%: 0.4 % (ref 0.0–7.0)
HCT: 28.5 % — ABNORMAL LOW (ref 34.8–46.6)
HEMOGLOBIN: 9.3 g/dL — AB (ref 11.6–15.9)
LYMPH%: 25.1 % (ref 14.0–49.7)
MCH: 32.4 pg (ref 25.1–34.0)
MCHC: 32.6 g/dL (ref 31.5–36.0)
MCV: 99.3 fL (ref 79.5–101.0)
MONO#: 0.7 10*3/uL (ref 0.1–0.9)
MONO%: 12.5 % (ref 0.0–14.0)
NEUT%: 61.8 % (ref 38.4–76.8)
NEUTROS ABS: 3.5 10*3/uL (ref 1.5–6.5)
Platelets: 215 10*3/uL (ref 145–400)
RBC: 2.87 10*6/uL — AB (ref 3.70–5.45)
RDW: 17.5 % — AB (ref 11.2–14.5)
WBC: 5.7 10*3/uL (ref 3.9–10.3)
lymph#: 1.4 10*3/uL (ref 0.9–3.3)

## 2015-07-30 LAB — COMPREHENSIVE METABOLIC PANEL
ALBUMIN: 3.5 g/dL (ref 3.5–5.0)
ALK PHOS: 108 U/L (ref 40–150)
ALT: 14 U/L (ref 0–55)
AST: 14 U/L (ref 5–34)
Anion Gap: 8 mEq/L (ref 3–11)
BUN: 17.2 mg/dL (ref 7.0–26.0)
CO2: 23 meq/L (ref 22–29)
CREATININE: 0.7 mg/dL (ref 0.6–1.1)
Calcium: 9.1 mg/dL (ref 8.4–10.4)
Chloride: 106 mEq/L (ref 98–109)
EGFR: >90 mL/min/{1.73_m2} (ref >90)
GLUCOSE: 92 mg/dL (ref 70–140)
Potassium: 4.2 mEq/L (ref 3.5–5.1)
SODIUM: 137 meq/L (ref 136–145)
TOTAL PROTEIN: 7.2 g/dL (ref 6.4–8.3)

## 2015-07-30 MED ORDER — SODIUM CHLORIDE 0.9 % IJ SOLN
10.0000 mL | INTRAMUSCULAR | Status: DC | PRN
  Administered 2015-07-30: 10 mL
  Filled 2015-07-30: qty 10

## 2015-07-30 MED ORDER — HEPARIN SOD (PORK) LOCK FLUSH 100 UNIT/ML IV SOLN
500.0000 [IU] | Freq: Once | INTRAVENOUS | Status: AC
  Administered 2015-07-30: 500 [IU] via INTRAVENOUS
  Filled 2015-07-30: qty 5

## 2015-07-30 MED ORDER — SODIUM CHLORIDE 0.9 % IV SOLN
600.0000 mg/m2 | Freq: Once | INTRAVENOUS | Status: AC
  Administered 2015-07-30: 950 mg via INTRAVENOUS
  Filled 2015-07-30: qty 24.98

## 2015-07-30 MED ORDER — PROCHLORPERAZINE MALEATE 10 MG PO TABS
ORAL_TABLET | ORAL | Status: AC
  Filled 2015-07-30: qty 1

## 2015-07-30 MED ORDER — ACETAMINOPHEN 325 MG PO TABS
325.0000 mg | ORAL_TABLET | Freq: Once | ORAL | Status: AC
  Administered 2015-07-30: 325 mg via ORAL

## 2015-07-30 MED ORDER — SODIUM CHLORIDE 0.9 % IJ SOLN
10.0000 mL | INTRAMUSCULAR | Status: DC | PRN
  Administered 2015-07-30: 10 mL via INTRAVENOUS
  Filled 2015-07-30: qty 10

## 2015-07-30 MED ORDER — PROCHLORPERAZINE MALEATE 10 MG PO TABS
10.0000 mg | ORAL_TABLET | Freq: Once | ORAL | Status: AC
  Administered 2015-07-30: 10 mg via ORAL

## 2015-07-30 MED ORDER — PEGFILGRASTIM 6 MG/0.6ML ~~LOC~~ PSKT
6.0000 mg | PREFILLED_SYRINGE | Freq: Once | SUBCUTANEOUS | Status: AC
  Administered 2015-07-30: 6 mg via SUBCUTANEOUS
  Filled 2015-07-30: qty 0.6

## 2015-07-30 MED ORDER — ACETAMINOPHEN 325 MG PO TABS
ORAL_TABLET | ORAL | Status: AC
  Filled 2015-07-30: qty 1

## 2015-07-30 MED ORDER — SODIUM CHLORIDE 0.9 % IV SOLN
Freq: Once | INTRAVENOUS | Status: AC
  Administered 2015-07-30: 12:00:00 via INTRAVENOUS

## 2015-07-30 MED ORDER — HEPARIN SOD (PORK) LOCK FLUSH 100 UNIT/ML IV SOLN
500.0000 [IU] | Freq: Once | INTRAVENOUS | Status: AC | PRN
  Administered 2015-07-30: 500 [IU]
  Filled 2015-07-30: qty 5

## 2015-07-30 NOTE — Telephone Encounter (Signed)
Appointments made and avs printed for patient per chart notes as patient had no other appointments,will send dr Marko Plume a inbox to ck and do orders/pof

## 2015-07-30 NOTE — Patient Instructions (Signed)
[facility] Discharge Instructions for Patients Receiving Chemotherapy  Today you received the following chemotherapy agents Gemzar  To help prevent nausea and vomiting after your treatment, we encourage you to take your nausea medication as needed   If you develop nausea and vomiting that is not controlled by your nausea medication, call the clinic.   BELOW ARE SYMPTOMS THAT SHOULD BE REPORTED IMMEDIATELY:  *FEVER GREATER THAN 100.5 F  *CHILLS WITH OR WITHOUT FEVER  NAUSEA AND VOMITING THAT IS NOT CONTROLLED WITH YOUR NAUSEA MEDICATION  *UNUSUAL SHORTNESS OF BREATH  *UNUSUAL BRUISING OR BLEEDING  TENDERNESS IN MOUTH AND THROAT WITH OR WITHOUT PRESENCE OF ULCERS  *URINARY PROBLEMS  *BOWEL PROBLEMS  UNUSUAL RASH Items with * indicate a potential emergency and should be followed up as soon as possible.  Feel free to call the clinic you have any questions or concerns. The clinic phone number is [phone].  Please show the CHEMO ALERT CARD at check-in to the Emergency Department and triage nurse.   

## 2015-08-03 ENCOUNTER — Other Ambulatory Visit: Payer: Self-pay | Admitting: Oncology

## 2015-08-03 DIAGNOSIS — C569 Malignant neoplasm of unspecified ovary: Secondary | ICD-10-CM

## 2015-08-13 ENCOUNTER — Ambulatory Visit: Payer: Self-pay

## 2015-08-13 ENCOUNTER — Other Ambulatory Visit (HOSPITAL_BASED_OUTPATIENT_CLINIC_OR_DEPARTMENT_OTHER): Payer: Self-pay

## 2015-08-13 ENCOUNTER — Ambulatory Visit (HOSPITAL_BASED_OUTPATIENT_CLINIC_OR_DEPARTMENT_OTHER): Payer: Self-pay

## 2015-08-13 VITALS — BP 105/67 | HR 73 | Temp 98.2°F | Resp 16

## 2015-08-13 DIAGNOSIS — Z5189 Encounter for other specified aftercare: Secondary | ICD-10-CM

## 2015-08-13 DIAGNOSIS — C562 Malignant neoplasm of left ovary: Secondary | ICD-10-CM

## 2015-08-13 DIAGNOSIS — Z95828 Presence of other vascular implants and grafts: Secondary | ICD-10-CM

## 2015-08-13 DIAGNOSIS — C569 Malignant neoplasm of unspecified ovary: Secondary | ICD-10-CM

## 2015-08-13 DIAGNOSIS — Z5111 Encounter for antineoplastic chemotherapy: Secondary | ICD-10-CM

## 2015-08-13 DIAGNOSIS — Z1509 Genetic susceptibility to other malignant neoplasm: Principal | ICD-10-CM

## 2015-08-13 DIAGNOSIS — C561 Malignant neoplasm of right ovary: Secondary | ICD-10-CM

## 2015-08-13 DIAGNOSIS — Z1501 Genetic susceptibility to malignant neoplasm of breast: Principal | ICD-10-CM

## 2015-08-13 LAB — COMPREHENSIVE METABOLIC PANEL
ALT: 10 U/L (ref 0–55)
AST: 9 U/L (ref 5–34)
Albumin: 3.6 g/dL (ref 3.5–5.0)
Alkaline Phosphatase: 106 U/L (ref 40–150)
Anion Gap: 8 mEq/L (ref 3–11)
BUN: 19.9 mg/dL (ref 7.0–26.0)
CHLORIDE: 106 meq/L (ref 98–109)
CO2: 25 meq/L (ref 22–29)
CREATININE: 0.7 mg/dL (ref 0.6–1.1)
Calcium: 9.2 mg/dL (ref 8.4–10.4)
EGFR: >90 mL/min/{1.73_m2} (ref >90)
GLUCOSE: 76 mg/dL (ref 70–140)
Potassium: 4.1 mEq/L (ref 3.5–5.1)
Sodium: 139 mEq/L (ref 136–145)
TOTAL PROTEIN: 7.6 g/dL (ref 6.4–8.3)

## 2015-08-13 LAB — CBC WITH DIFFERENTIAL/PLATELET
BASO%: 0.2 % (ref 0.0–2.0)
Basophils Absolute: 0 10*3/uL (ref 0.0–0.1)
EOS%: 0.5 % (ref 0.0–7.0)
Eosinophils Absolute: 0 10*3/uL (ref 0.0–0.5)
HEMATOCRIT: 29.6 % — AB (ref 34.8–46.6)
HGB: 9.8 g/dL — ABNORMAL LOW (ref 11.6–15.9)
LYMPH#: 1.5 10*3/uL (ref 0.9–3.3)
LYMPH%: 36.5 % (ref 14.0–49.7)
MCH: 32.8 pg (ref 25.1–34.0)
MCHC: 33.1 g/dL (ref 31.5–36.0)
MCV: 99 fL (ref 79.5–101.0)
MONO#: 0.7 10*3/uL (ref 0.1–0.9)
MONO%: 16.4 % — ABNORMAL HIGH (ref 0.0–14.0)
NEUT%: 46.4 % (ref 38.4–76.8)
NEUTROS ABS: 1.9 10*3/uL (ref 1.5–6.5)
PLATELETS: 262 10*3/uL (ref 145–400)
RBC: 2.99 10*6/uL — AB (ref 3.70–5.45)
RDW: 15.5 % — ABNORMAL HIGH (ref 11.2–14.5)
WBC: 4.1 10*3/uL (ref 3.9–10.3)

## 2015-08-13 MED ORDER — SODIUM CHLORIDE 0.9 % IV SOLN
600.0000 mg/m2 | Freq: Once | INTRAVENOUS | Status: AC
  Administered 2015-08-13: 950 mg via INTRAVENOUS
  Filled 2015-08-13: qty 24.98

## 2015-08-13 MED ORDER — SODIUM CHLORIDE 0.9 % IV SOLN
Freq: Once | INTRAVENOUS | Status: AC
  Administered 2015-08-13: 11:00:00 via INTRAVENOUS

## 2015-08-13 MED ORDER — PEGFILGRASTIM 6 MG/0.6ML ~~LOC~~ PSKT
6.0000 mg | PREFILLED_SYRINGE | Freq: Once | SUBCUTANEOUS | Status: AC
  Administered 2015-08-13: 6 mg via SUBCUTANEOUS
  Filled 2015-08-13: qty 0.6

## 2015-08-13 MED ORDER — PROCHLORPERAZINE MALEATE 10 MG PO TABS
10.0000 mg | ORAL_TABLET | Freq: Once | ORAL | Status: AC
  Administered 2015-08-13: 10 mg via ORAL

## 2015-08-13 MED ORDER — HEPARIN SOD (PORK) LOCK FLUSH 100 UNIT/ML IV SOLN
500.0000 [IU] | Freq: Once | INTRAVENOUS | Status: AC | PRN
  Administered 2015-08-13: 500 [IU]
  Filled 2015-08-13: qty 5

## 2015-08-13 MED ORDER — ACETAMINOPHEN 325 MG PO TABS
325.0000 mg | ORAL_TABLET | Freq: Once | ORAL | Status: AC
  Administered 2015-08-13: 325 mg via ORAL

## 2015-08-13 MED ORDER — PROCHLORPERAZINE MALEATE 10 MG PO TABS
ORAL_TABLET | ORAL | Status: AC
  Filled 2015-08-13: qty 1

## 2015-08-13 MED ORDER — ACETAMINOPHEN 325 MG PO TABS
ORAL_TABLET | ORAL | Status: AC
  Filled 2015-08-13: qty 1

## 2015-08-13 MED ORDER — SODIUM CHLORIDE 0.9 % IJ SOLN
10.0000 mL | INTRAMUSCULAR | Status: DC | PRN
  Administered 2015-08-13: 10 mL
  Filled 2015-08-13: qty 10

## 2015-08-13 MED ORDER — SODIUM CHLORIDE 0.9 % IJ SOLN
10.0000 mL | INTRAMUSCULAR | Status: DC | PRN
  Administered 2015-08-13: 10 mL via INTRAVENOUS
  Filled 2015-08-13: qty 10

## 2015-08-13 NOTE — Patient Instructions (Signed)
[facility] Discharge Instructions for Patients Receiving Chemotherapy  Today you received the following chemotherapy agents gemzar  To help prevent nausea and vomiting after your treatment, we encourage you to take your nausea medication as needed.   If you develop nausea and vomiting that is not controlled by your nausea medication, call the clinic.   BELOW ARE SYMPTOMS THAT SHOULD BE REPORTED IMMEDIATELY:  *FEVER GREATER THAN 100.5 F  *CHILLS WITH OR WITHOUT FEVER  NAUSEA AND VOMITING THAT IS NOT CONTROLLED WITH YOUR NAUSEA MEDICATION  *UNUSUAL SHORTNESS OF BREATH  *UNUSUAL BRUISING OR BLEEDING  TENDERNESS IN MOUTH AND THROAT WITH OR WITHOUT PRESENCE OF ULCERS  *URINARY PROBLEMS  *BOWEL PROBLEMS  UNUSUAL RASH Items with * indicate a potential emergency and should be followed up as soon as possible.  Feel free to call the clinic you have any questions or concerns. The clinic phone number is [phone].  Please show the [address] at check-in to the Emergency Department and triage nurse.

## 2015-08-14 LAB — CA 125: Cancer Antigen (CA) 125: 27.2 U/mL (ref 0.0–38.1)

## 2015-08-14 LAB — CANCER ANTIGEN 125 (PARALLEL TESTING): CA 125: 35 U/mL — ABNORMAL HIGH (ref <35)

## 2015-08-26 ENCOUNTER — Other Ambulatory Visit: Payer: Self-pay | Admitting: Oncology

## 2015-08-26 DIAGNOSIS — C562 Malignant neoplasm of left ovary: Secondary | ICD-10-CM

## 2015-08-26 DIAGNOSIS — Z1501 Genetic susceptibility to malignant neoplasm of breast: Principal | ICD-10-CM

## 2015-08-26 DIAGNOSIS — Z1509 Genetic susceptibility to other malignant neoplasm: Principal | ICD-10-CM

## 2015-08-27 ENCOUNTER — Ambulatory Visit: Payer: Self-pay

## 2015-08-27 ENCOUNTER — Other Ambulatory Visit (HOSPITAL_BASED_OUTPATIENT_CLINIC_OR_DEPARTMENT_OTHER): Payer: Self-pay

## 2015-08-27 ENCOUNTER — Encounter: Payer: Self-pay | Admitting: Oncology

## 2015-08-27 ENCOUNTER — Ambulatory Visit (HOSPITAL_BASED_OUTPATIENT_CLINIC_OR_DEPARTMENT_OTHER): Payer: Self-pay | Admitting: Oncology

## 2015-08-27 ENCOUNTER — Ambulatory Visit (HOSPITAL_BASED_OUTPATIENT_CLINIC_OR_DEPARTMENT_OTHER): Payer: Self-pay

## 2015-08-27 VITALS — BP 121/79 | HR 69 | Temp 98.1°F | Resp 18 | Ht 62.0 in | Wt 133.2 lb

## 2015-08-27 VITALS — BP 110/72 | HR 70 | Temp 98.4°F | Resp 18

## 2015-08-27 DIAGNOSIS — T451X5A Adverse effect of antineoplastic and immunosuppressive drugs, initial encounter: Secondary | ICD-10-CM

## 2015-08-27 DIAGNOSIS — D509 Iron deficiency anemia, unspecified: Secondary | ICD-10-CM

## 2015-08-27 DIAGNOSIS — C569 Malignant neoplasm of unspecified ovary: Secondary | ICD-10-CM

## 2015-08-27 DIAGNOSIS — C562 Malignant neoplasm of left ovary: Secondary | ICD-10-CM

## 2015-08-27 DIAGNOSIS — Z95828 Presence of other vascular implants and grafts: Secondary | ICD-10-CM

## 2015-08-27 DIAGNOSIS — Z1509 Genetic susceptibility to other malignant neoplasm: Principal | ICD-10-CM

## 2015-08-27 DIAGNOSIS — Z1501 Genetic susceptibility to malignant neoplasm of breast: Principal | ICD-10-CM

## 2015-08-27 DIAGNOSIS — D6481 Anemia due to antineoplastic chemotherapy: Secondary | ICD-10-CM

## 2015-08-27 DIAGNOSIS — D701 Agranulocytosis secondary to cancer chemotherapy: Secondary | ICD-10-CM

## 2015-08-27 DIAGNOSIS — Z5111 Encounter for antineoplastic chemotherapy: Secondary | ICD-10-CM

## 2015-08-27 DIAGNOSIS — Z5189 Encounter for other specified aftercare: Secondary | ICD-10-CM

## 2015-08-27 DIAGNOSIS — C561 Malignant neoplasm of right ovary: Secondary | ICD-10-CM

## 2015-08-27 DIAGNOSIS — Z853 Personal history of malignant neoplasm of breast: Secondary | ICD-10-CM

## 2015-08-27 LAB — CBC WITH DIFFERENTIAL/PLATELET
BASO%: 0.6 % (ref 0.0–2.0)
Basophils Absolute: 0 10*3/uL (ref 0.0–0.1)
EOS ABS: 0 10*3/uL (ref 0.0–0.5)
EOS%: 0.4 % (ref 0.0–7.0)
HCT: 30.5 % — ABNORMAL LOW (ref 34.8–46.6)
HEMOGLOBIN: 10 g/dL — AB (ref 11.6–15.9)
LYMPH#: 1.5 10*3/uL (ref 0.9–3.3)
LYMPH%: 35.6 % (ref 14.0–49.7)
MCH: 32.7 pg (ref 25.1–34.0)
MCHC: 32.9 g/dL (ref 31.5–36.0)
MCV: 99.6 fL (ref 79.5–101.0)
MONO#: 0.6 10*3/uL (ref 0.1–0.9)
MONO%: 13.3 % (ref 0.0–14.0)
NEUT%: 50.1 % (ref 38.4–76.8)
NEUTROS ABS: 2.2 10*3/uL (ref 1.5–6.5)
Platelets: 327 10*3/uL (ref 145–400)
RBC: 3.07 10*6/uL — ABNORMAL LOW (ref 3.70–5.45)
RDW: 15 % — AB (ref 11.2–14.5)
WBC: 4.3 10*3/uL (ref 3.9–10.3)

## 2015-08-27 LAB — COMPREHENSIVE METABOLIC PANEL
ALK PHOS: 104 U/L (ref 40–150)
ALT: 10 U/L (ref 0–55)
AST: 11 U/L (ref 5–34)
Albumin: 3.6 g/dL (ref 3.5–5.0)
Anion Gap: 10 mEq/L (ref 3–11)
BUN: 15.9 mg/dL (ref 7.0–26.0)
CO2: 25 mEq/L (ref 22–29)
Calcium: 9 mg/dL (ref 8.4–10.4)
Chloride: 103 mEq/L (ref 98–109)
Creatinine: 0.8 mg/dL (ref 0.6–1.1)
EGFR: >90 mL/min/{1.73_m2} (ref >90)
GLUCOSE: 102 mg/dL (ref 70–140)
Potassium: 3.9 mEq/L (ref 3.5–5.1)
SODIUM: 138 meq/L (ref 136–145)
TOTAL PROTEIN: 7.7 g/dL (ref 6.4–8.3)

## 2015-08-27 MED ORDER — ACETAMINOPHEN 325 MG PO TABS
ORAL_TABLET | ORAL | Status: AC
  Filled 2015-08-27: qty 1

## 2015-08-27 MED ORDER — PEGFILGRASTIM 6 MG/0.6ML ~~LOC~~ PSKT
6.0000 mg | PREFILLED_SYRINGE | Freq: Once | SUBCUTANEOUS | Status: AC
  Administered 2015-08-27: 6 mg via SUBCUTANEOUS
  Filled 2015-08-27: qty 0.6

## 2015-08-27 MED ORDER — SODIUM CHLORIDE 0.9 % IJ SOLN
10.0000 mL | INTRAMUSCULAR | Status: DC | PRN
  Administered 2015-08-27: 10 mL
  Filled 2015-08-27: qty 10

## 2015-08-27 MED ORDER — HEPARIN SOD (PORK) LOCK FLUSH 100 UNIT/ML IV SOLN
500.0000 [IU] | Freq: Once | INTRAVENOUS | Status: AC | PRN
  Administered 2015-08-27: 500 [IU]
  Filled 2015-08-27: qty 5

## 2015-08-27 MED ORDER — SODIUM CHLORIDE 0.9 % IV SOLN
Freq: Once | INTRAVENOUS | Status: AC
  Administered 2015-08-27: 11:00:00 via INTRAVENOUS

## 2015-08-27 MED ORDER — PROCHLORPERAZINE MALEATE 10 MG PO TABS
ORAL_TABLET | ORAL | Status: AC
  Filled 2015-08-27: qty 1

## 2015-08-27 MED ORDER — GEMCITABINE HCL CHEMO INJECTION 1 GM/26.3ML
600.0000 mg/m2 | Freq: Once | INTRAVENOUS | Status: AC
  Administered 2015-08-27: 950 mg via INTRAVENOUS
  Filled 2015-08-27: qty 24.98

## 2015-08-27 MED ORDER — PROCHLORPERAZINE MALEATE 10 MG PO TABS
10.0000 mg | ORAL_TABLET | Freq: Once | ORAL | Status: AC
  Administered 2015-08-27: 10 mg via ORAL

## 2015-08-27 MED ORDER — SODIUM CHLORIDE 0.9 % IJ SOLN
10.0000 mL | Freq: Once | INTRAMUSCULAR | Status: AC
  Administered 2015-08-27: 10 mL
  Filled 2015-08-27: qty 10

## 2015-08-27 MED ORDER — ACETAMINOPHEN 325 MG PO TABS
325.0000 mg | ORAL_TABLET | Freq: Once | ORAL | Status: AC
  Administered 2015-08-27: 325 mg via ORAL

## 2015-08-27 NOTE — Progress Notes (Signed)
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 She is tolerating the every other week gemzar well with on pro neulasta support.  Last imaging was CT AP 03-12-15.  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   Patient has only minimal discomfort with present gemzar, does have some mild achiness after neulasta. She has no nausea, no fevers, no abdominal distension now. She has some increased bowel gas, thinks this is diet related with less fruits/ veg in winter, better with increased colace this week. No abdominal or pelvic discomfort. No bleeding. No LE swelling. No problems with PAC, no noted changes in breasts. No SOB or other respiratory symptoms. Energy is good, enjoying activities at home including quilting. Remainder of 10 point Review of Systems negative.   PAC in  BRCA 2 mutation ([id number]*) by [employer] 10-12-14 Flu vaccine done 04-02-15 CA 125 on 04-09-15 baseline for gemzar 313,      ONCOLOGIC HISTORY Left ovarian cancer: Patient became symptomatic with abdominal swelling and constipation in late Nov 2015, seen and treated initially for what was thought to be ulcer. Symptoms continued, with early satiety causing poor po intake; she was seen again at [facility] in early Jan 2016, with CT reportedly consistent with stage IIIC or IV gyn cancer, including omental caking, large pelvic mass, para-aortic adenopathy, ascites, pleural effusions and right subpleural nodule. She was seen in consultation by Dr Denman George on 07-25-14, with concern from her exam and scan for rectal involvement. 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 She had paracentesis of 3.4 liters ascites on 07-27-2014, with cytology ([id number]) showing serous carcinoma consistent with gyn primary, ER negative. She had right thoracentesis for 350 cc on 08-11-14, this requested by anesthesia prior to surgery and apparently not sent for cytology. Patient was more comfortable after paracentesis, but could not tell any difference in breathing after thoracentesis (denies SOB prior to that procedure). CA 125 on 08-02-14 was 1162. She was taken to exploratory laparotomy by Dr Denman George on 08-15-14, with 4 liters of ascites removed and biopsy of omentum obtained, with additional debulking not attempted due to surgical findings of entire peritoneal surface replaced by tumor plaque, small intestine coated with tumor and mesentery tethered with tumor nodules, ileum adherent to pelvic mass, dense plaque of tumor on right diaphragm and 10 cm omental cake in LUQ. Pathology of omental biopsy She needed another paracentesis by IR for 1.3 liters ascites on 08-17-14, and had PAC placed by IR also 08-17-14. Day 1 cycle 1 dose dense carboplatin taxol was given in hospital on 08-18-14. After 2 cycles of dose dense regimen, the carboplatin and taxol was changed to every 3 week regimen beginning cycle 3; she had cycle 5 on 11-09-14, with gCSF support. She had interval debulking by Dr Denman George on 11-28-14. Surgery was exploratory laparotomy with TAH/ BSO, omentectomy and radical tumor debulking; R1 resection with residual milial tumor on small bowel at completion of procedure. Tumor involved bilateral adnexa, uterine serosa, omentum and falciform ligament, with + LVSI, no nodes examined. Maximum tumor size was 4.3 cm involving left ovary. She resumed carboplatin Taxol on 12-21-14, total of 3 cycles thru  02-01-15 (= 3 cycles after interval debulking). Restaging CT CAP 03-12-15 had some changes concerning for residual or recurrent disease, and CA 125 was elevated to 64 on 03-14-15, having been 6 in July 2016.   Review of prior radiation and chemotherapy (for left breast cancer) significant for use of epirubicin and some unquantified cardiac exposure to radiation. She began gemcitabine 04-09-15. She was neutropenic by 04-16-15. Regimen was changed to every other week gemzar with neulasta support, gemzar dose also decreased beginning cycle 2.  BRCA 2 abnormality identified 10-12-14.  Breast cancer: Left breast biopsy 11-22-2003 ([id number]) invasive carcinoma. Left breast wide excision with sentinel nodes and axillary contents by Dr Benson Norway 01-03-2004 [id number]) poorly differentiated invasive ductal carcinoma 3.0 cm with closest margin 1.7 cm deep, 6 sentinel nodes + 3 additional nodes all negative, ER 0, PR 0, HER 2 3+, Ki67 of 19%. . She received adjuvant chemotherapy by Dr Sonny Dandy with 5FU, epirubicin, cytoxan x 4 cycles from 02-13-2004 thru 04-16-2004, followed by taxol 80 mg/m2 weekly x 11 from 05-07-2004 thru 09-04-2004, and herceptin 05-21-2004 thru 02-25-2005. Antiemetics were Kytril and decadron. She had PAC for the chemotherapy. She had radiation by Dr Tyler Pita in [place]. Last note from Dr Sonny Dandy in this information was 04-2008. Left diagnostic mammogram [facility] 10-25-2014 stable compared with 12-06-13 and priors; bilateral at [facility] 02-13-15. Breast MRI 05-25-15 had no evidence of malignancy there, this medically necessary with BRCA 2 mutation.  .  Objective:  Vital signs in last 24 hours:  BP 121/79 mmHg  Pulse 69  Temp(Src) 98.1 F (36.7 C) (Oral)  Resp 18  Ht 5' 2"  (1.575 m)  Wt 133 lb 3.2 oz (60.419 kg)  BMI 24.36 kg/m2  SpO2 100%  weight up 1 lb. Alert, oriented and appropriate, looks entirely comfortable. Ambulatory without difficulty. Respirations not labored RA Partial alopecia  HEENT:PERRL, sclerae not icteric. Oral mucosa moist without lesions, posterior pharynx clear.  Neck supple. No JVD.  Lymphatics:no cervical,supraclavicular, axillary or inguinal adenopathy Resp: clear to  auscultation bilaterally and normal percussion bilaterally Cardio: regular rate and rhythm. No gallop. GI: soft, nontender, not distended, no mass or organomegaly. Normally active bowel sounds. Surgical incision not remarkable. Musculoskeletal/ Extremities: without pitting edema, cords, tenderness Neuro: no peripheral neuropathy. Otherwise nonfocal. PSYCH appropriate mood and affect. Skin without rash, ecchymosis, petechiae Breasts: left with thickened area superiorly unchanged, otherwise bilaterally without dominant mass, skin or nipple findings. Axillae benign. Portacath-without erythema or tenderness  Lab Results:  Results for orders placed or performed in visit on 08/27/15  CBC with Differential  Result Value Ref Range   WBC 4.3 3.9 - 10.3 10e3/uL   NEUT# 2.2 1.5 - 6.5 10e3/uL   HGB 10.0 (L) 11.6 - 15.9 g/dL   HCT 30.5 (L) 34.8 - 46.6 %   Platelets 327 145 - 400 10e3/uL   MCV 99.6 79.5 - 101.0 fL   MCH 32.7 25.1 - 34.0 pg   MCHC 32.9 31.5 - 36.0 g/dL   RBC 3.07 (L) 3.70 - 5.45 10e6/uL   RDW 15.0 (H) 11.2 - 14.5 %   lymph# 1.5 0.9 - 3.3 10e3/uL   MONO# 0.6 0.1 - 0.9 10e3/uL   Eosinophils Absolute 0.0 0.0 - 0.5 10e3/uL   Basophils Absolute 0.0 0.0 - 0.1 10e3/uL   NEUT% 50.1 38.4 - 76.8 %   LYMPH% 35.6 14.0 - 49.7 %   MONO% 13.3 0.0 - 14.0 %   EOS% 0.4 0.0 - 7.0 %   BASO% 0.6 0.0 -  2.0 %  Comprehensive metabolic panel  Result Value Ref Range   Sodium 138 136 - 145 mEq/L   Potassium 3.9 3.5 - 5.1 mEq/L   Chloride 103 98 - 109 mEq/L   CO2 25 22 - 29 mEq/L   Glucose 102 70 - 140 mg/dl   BUN 15.9 7.0 - 26.0 mg/dL   Creatinine 0.8 0.6 - 1.1 mg/dL   Total Bilirubin <0.30 0.20 - 1.20 mg/dL   Alkaline Phosphatase 104 40 - 150 U/L   AST 11 5 - 34 U/L   ALT 10 0 - 55 U/L   Total Protein 7.7 6.4 - 8.3 g/dL   Albumin 3.6 3.5 - 5.0 g/dL   Calcium 9.0 8.4 - 10.4 mg/dL   Anion Gap 10 3 - 11 mEq/L   EGFR >90 >90 ml/min/1.73 m2   Last CA 125 on 08-13-15 was 35 by previous lab method  (27 by new lab method) as compared with 30 on 07-17-15 and 96 on 06-04-15.  Will repeat CA 125 with labs on 09-10-15  Studies/Results:  No results found.  Medications: I have reviewed the patient's current medications. Fine to adjust colace prn. She uses gabapentin at hs for a few nights after each neulasta, which is helpful  DISCUSSION Clinically doing well on gemzar, which she is also tolerating well. Would repeat CT if she does not tolerate continued treatment, if marker rises significantly or if other concerns for disease progression or acute concerns. In absence of those, seems very appropriate to continue present gemzar and follow closely.  Patient to call our office for results of CA 125 if she does not hear back within a few days of that being drawn, as she does NOT use MyChart and as the new lab methodology/ parallel testing can be confusing.    Assessment/Plan: 1. 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 Rapid recurrence/ progression of gyn cancer within 4 weeks of completing that chemo. Paracentesis confirmed serous malignancy.Clinically responding to gemzar including CA125 now in normal range. Continue every other week gemzar with neulasta support. She prefers treatment on Mondays if possible. 2.BRCA 2 abnormality. 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 Bilateral mammograms at [facility] done 02-13-15. With BRCA 2 mutation, and as she is responding to treatment for the ovarian cancer, breast MRI done 05-25-15, no evidence of active malignancy there. 4.PAC in 5.chemo and iron deficiency anemia: hemoglobin a little better, continue oral iron. Not obviously more symptomatic,  follow 6.nutritional status improved 7.RBBB and LAFB, HTN, known to Dr Acie Fredrickson. Previous epirubicin, some cardiac exposure from left breast radiation. 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 On Pro system very useful given travel distance.   All questions answered. Chemo and neulasta orders confirmed. Note to RN to communicate CA 125 results. Time spent 25 min including >50% counseling and coordination of care. Cc Dr Rocco Pauls, MD   08/28/2015, 1:17 PM

## 2015-08-27 NOTE — Patient Instructions (Signed)
Pegfilgrastim injection What is this medicine? PEGFILGRASTIM (PEG fil gra stim) is a long-acting granulocyte colony-stimulating factor that stimulates the growth of neutrophils, a type of white blood cell important in the body's fight against infection. It is used to reduce the incidence of fever and infection in patients with certain types of cancer who are receiving chemotherapy that affects the bone marrow, and to increase survival after being exposed to high doses of radiation. This medicine may be used for other purposes; ask your health care provider or pharmacist if you have questions. What should I tell my health care provider before I take this medicine? They need to know if you have any of these conditions: -kidney disease -latex allergy -ongoing radiation therapy -sickle cell disease -skin reactions to acrylic adhesives (On-Body Injector only) -an unusual or allergic reaction to pegfilgrastim, filgrastim, other medicines, foods, dyes, or preservatives -pregnant or trying to get pregnant -breast-feeding How should I use this medicine? This medicine is for injection under the skin. If you get this medicine at home, you will be taught how to prepare and give the pre-filled syringe or how to use the On-body Injector. Refer to the patient Instructions for Use for detailed instructions. Use exactly as directed. Take your medicine at regular intervals. Do not take your medicine more often than directed. It is important that you put your used needles and syringes in a special sharps container. Do not put them in a trash can. If you do not have a sharps container, call your pharmacist or healthcare provider to get one. Talk to your pediatrician regarding the use of this medicine in children. While this drug may be prescribed for selected conditions, precautions do apply. Overdosage: If you think you have taken too much of this medicine contact a poison control center or emergency room at  once. NOTE: This medicine is only for you. Do not share this medicine with others. What if I miss a dose? It is important not to miss your dose. Call your doctor or health care professional if you miss your dose. If you miss a dose due to an On-body Injector failure or leakage, a new dose should be administered as soon as possible using a single prefilled syringe for manual use. What may interact with this medicine? Interactions have not been studied. Give your health care provider a list of all the medicines, herbs, non-prescription drugs, or dietary supplements you use. Also tell them if you smoke, drink alcohol, or use illegal drugs. Some items may interact with your medicine. This list may not describe all possible interactions. Give your health care provider a list of all the medicines, herbs, non-prescription drugs, or dietary supplements you use. Also tell them if you smoke, drink alcohol, or use illegal drugs. Some items may interact with your medicine. What should I watch for while using this medicine? You may need blood work done while you are taking this medicine. If you are going to need a MRI, CT scan, or other procedure, tell your doctor that you are using this medicine (On-Body Injector only). What side effects may I notice from receiving this medicine? Side effects that you should report to your doctor or health care professional as soon as possible: -allergic reactions like skin rash, itching or hives, swelling of the face, lips, or tongue -dizziness -fever -pain, redness, or irritation at site where injected -pinpoint red spots on the skin -red or dark-brown urine -shortness of breath or breathing problems -stomach or side pain, or pain   at the shoulder -swelling -tiredness -trouble passing urine or change in the amount of urine Side effects that usually do not require medical attention (report to your doctor or health care professional if they continue or are  bothersome): -bone pain -muscle pain This list may not describe all possible side effects. Call your doctor for medical advice about side effects. You may report side effects to FDA at 1-800-FDA-1088. Where should I keep my medicine? Keep out of the reach of children. Store pre-filled syringes in a refrigerator between 2 and 8 degrees C (36 and 46 degrees F). Do not freeze. Keep in carton to protect from light. Throw away this medicine if it is left out of the refrigerator for more than 48 hours. Throw away any unused medicine after the expiration date. NOTE: This sheet is a summary. It may not cover all possible information. If you have questions about this medicine, talk to your doctor, pharmacist, or health care provider.    2016, Elsevier/Gold Standard. (2014-07-27 14:30:14) Gemcitabine injection What is this medicine? GEMCITABINE (jem SIT a been) is a chemotherapy drug. This medicine is used to treat many types of cancer like breast cancer, lung cancer, pancreatic cancer, and ovarian cancer. This medicine may be used for other purposes; ask your health care provider or pharmacist if you have questions. What should I tell my health care provider before I take this medicine? They need to know if you have any of these conditions: -blood disorders -infection -kidney disease -liver disease -recent or ongoing radiation therapy -an unusual or allergic reaction to gemcitabine, other chemotherapy, other medicines, foods, dyes, or preservatives -pregnant or trying to get pregnant -breast-feeding How should I use this medicine? This drug is given as an infusion into a vein. It is administered in a hospital or clinic by a specially trained health care professional. Talk to your pediatrician regarding the use of this medicine in children. Special care may be needed. Overdosage: If you think you have taken too much of this medicine contact a poison control center or emergency room at once. NOTE:  This medicine is only for you. Do not share this medicine with others. What if I miss a dose? It is important not to miss your dose. Call your doctor or health care professional if you are unable to keep an appointment. What may interact with this medicine? -medicines to increase blood counts like filgrastim, pegfilgrastim, sargramostim -some other chemotherapy drugs like cisplatin -vaccines Talk to your doctor or health care professional before taking any of these medicines: -acetaminophen -aspirin -ibuprofen -ketoprofen -naproxen This list may not describe all possible interactions. Give your health care provider a list of all the medicines, herbs, non-prescription drugs, or dietary supplements you use. Also tell them if you smoke, drink alcohol, or use illegal drugs. Some items may interact with your medicine. What should I watch for while using this medicine? Visit your doctor for checks on your progress. This drug may make you feel generally unwell. This is not uncommon, as chemotherapy can affect healthy cells as well as cancer cells. Report any side effects. Continue your course of treatment even though you feel ill unless your doctor tells you to stop. In some cases, you may be given additional medicines to help with side effects. Follow all directions for their use. Call your doctor or health care professional for advice if you get a fever, chills or sore throat, or other symptoms of a cold or flu. Do not treat yourself. This drug decreases  your body's ability to fight infections. Try to avoid being around people who are sick. This medicine may increase your risk to bruise or bleed. Call your doctor or health care professional if you notice any unusual bleeding. Be careful brushing and flossing your teeth or using a toothpick because you may get an infection or bleed more easily. If you have any dental work done, tell your dentist you are receiving this medicine. Avoid taking products  that contain aspirin, acetaminophen, ibuprofen, naproxen, or ketoprofen unless instructed by your doctor. These medicines may hide a fever. Women should inform their doctor if they wish to become pregnant or think they might be pregnant. There is a potential for serious side effects to an unborn child. Talk to your health care professional or pharmacist for more information. Do not breast-feed an infant while taking this medicine. What side effects may I notice from receiving this medicine? Side effects that you should report to your doctor or health care professional as soon as possible: -allergic reactions like skin rash, itching or hives, swelling of the face, lips, or tongue -low blood counts - this medicine may decrease the number of white blood cells, red blood cells and platelets. You may be at increased risk for infections and bleeding. -signs of infection - fever or chills, cough, sore throat, pain or difficulty passing urine -signs of decreased platelets or bleeding - bruising, pinpoint red spots on the skin, black, tarry stools, blood in the urine -signs of decreased red blood cells - unusually weak or tired, fainting spells, lightheadedness -breathing problems -chest pain -mouth sores -nausea and vomiting -pain, swelling, redness at site where injected -pain, tingling, numbness in the hands or feet -stomach pain -swelling of ankles, feet, hands -unusual bleeding Side effects that usually do not require medical attention (report to your doctor or health care professional if they continue or are bothersome): -constipation -diarrhea -hair loss -loss of appetite -stomach upset This list may not describe all possible side effects. Call your doctor for medical advice about side effects. You may report side effects to FDA at 1-800-FDA-1088. Where should I keep my medicine? This drug is given in a hospital or clinic and will not be stored at home. NOTE: This sheet is a summary. It may  not cover all possible information. If you have questions about this medicine, talk to your doctor, pharmacist, or health care provider.    2016, Elsevier/Gold Standard. (2007-11-16 18:45:54)

## 2015-08-28 ENCOUNTER — Telehealth: Payer: Self-pay | Admitting: Oncology

## 2015-08-28 DIAGNOSIS — D509 Iron deficiency anemia, unspecified: Secondary | ICD-10-CM | POA: Insufficient documentation

## 2015-08-28 NOTE — Telephone Encounter (Signed)
S/w pt, confirmed appt for 2/20 and gv appt for 3/6.

## 2015-09-07 ENCOUNTER — Encounter: Payer: Self-pay | Admitting: Oncology

## 2015-09-07 NOTE — Progress Notes (Signed)
forms left on my desk

## 2015-09-10 ENCOUNTER — Ambulatory Visit (HOSPITAL_BASED_OUTPATIENT_CLINIC_OR_DEPARTMENT_OTHER): Payer: Self-pay

## 2015-09-10 ENCOUNTER — Encounter: Payer: Self-pay | Admitting: Oncology

## 2015-09-10 ENCOUNTER — Ambulatory Visit: Payer: Self-pay

## 2015-09-10 ENCOUNTER — Other Ambulatory Visit (HOSPITAL_BASED_OUTPATIENT_CLINIC_OR_DEPARTMENT_OTHER): Payer: Self-pay

## 2015-09-10 VITALS — BP 113/75 | HR 72 | Temp 98.8°F

## 2015-09-10 DIAGNOSIS — Z5111 Encounter for antineoplastic chemotherapy: Secondary | ICD-10-CM

## 2015-09-10 DIAGNOSIS — Z1509 Genetic susceptibility to other malignant neoplasm: Principal | ICD-10-CM

## 2015-09-10 DIAGNOSIS — Z5189 Encounter for other specified aftercare: Secondary | ICD-10-CM

## 2015-09-10 DIAGNOSIS — Z1501 Genetic susceptibility to malignant neoplasm of breast: Principal | ICD-10-CM

## 2015-09-10 DIAGNOSIS — C562 Malignant neoplasm of left ovary: Secondary | ICD-10-CM

## 2015-09-10 DIAGNOSIS — C561 Malignant neoplasm of right ovary: Secondary | ICD-10-CM

## 2015-09-10 DIAGNOSIS — Z95828 Presence of other vascular implants and grafts: Secondary | ICD-10-CM

## 2015-09-10 LAB — CBC WITH DIFFERENTIAL/PLATELET
BASO%: 0.5 % (ref 0.0–2.0)
Basophils Absolute: 0 10*3/uL (ref 0.0–0.1)
EOS%: 0.4 % (ref 0.0–7.0)
Eosinophils Absolute: 0 10*3/uL (ref 0.0–0.5)
HCT: 28.8 % — ABNORMAL LOW (ref 34.8–46.6)
HGB: 9.5 g/dL — ABNORMAL LOW (ref 11.6–15.9)
LYMPH#: 1.3 10*3/uL (ref 0.9–3.3)
LYMPH%: 25.3 % (ref 14.0–49.7)
MCH: 32.7 pg (ref 25.1–34.0)
MCHC: 33 g/dL (ref 31.5–36.0)
MCV: 99.1 fL (ref 79.5–101.0)
MONO#: 0.5 10*3/uL (ref 0.1–0.9)
MONO%: 10.3 % (ref 0.0–14.0)
NEUT%: 63.5 % (ref 38.4–76.8)
NEUTROS ABS: 3.3 10*3/uL (ref 1.5–6.5)
Platelets: 322 10*3/uL (ref 145–400)
RBC: 2.9 10*6/uL — AB (ref 3.70–5.45)
RDW: 14.7 % — ABNORMAL HIGH (ref 11.2–14.5)
WBC: 5.2 10*3/uL (ref 3.9–10.3)

## 2015-09-10 LAB — COMPREHENSIVE METABOLIC PANEL
AST: 10 U/L (ref 5–34)
Albumin: 3.3 g/dL — ABNORMAL LOW (ref 3.5–5.0)
Alkaline Phosphatase: 91 U/L (ref 40–150)
Anion Gap: 8 mEq/L (ref 3–11)
BUN: 17.9 mg/dL (ref 7.0–26.0)
CHLORIDE: 107 meq/L (ref 98–109)
CO2: 25 meq/L (ref 22–29)
CREATININE: 0.7 mg/dL (ref 0.6–1.1)
Calcium: 8.9 mg/dL (ref 8.4–10.4)
EGFR: >90 mL/min/{1.73_m2} (ref >90)
Glucose: 86 mg/dl (ref 70–140)
Potassium: 3.9 mEq/L (ref 3.5–5.1)
Sodium: 141 mEq/L (ref 136–145)
Total Protein: 7.1 g/dL (ref 6.4–8.3)

## 2015-09-10 MED ORDER — ACETAMINOPHEN 325 MG PO TABS
325.0000 mg | ORAL_TABLET | Freq: Once | ORAL | Status: AC
  Administered 2015-09-10: 325 mg via ORAL

## 2015-09-10 MED ORDER — HEPARIN SOD (PORK) LOCK FLUSH 100 UNIT/ML IV SOLN
500.0000 [IU] | Freq: Once | INTRAVENOUS | Status: AC | PRN
  Administered 2015-09-10: 500 [IU]
  Filled 2015-09-10: qty 5

## 2015-09-10 MED ORDER — SODIUM CHLORIDE 0.9% FLUSH
10.0000 mL | INTRAVENOUS | Status: DC | PRN
Start: 2015-09-10 — End: 2015-09-10
  Administered 2015-09-10: 10 mL via INTRAVENOUS
  Filled 2015-09-10: qty 10

## 2015-09-10 MED ORDER — ACETAMINOPHEN 325 MG PO TABS
ORAL_TABLET | ORAL | Status: AC
  Filled 2015-09-10: qty 1

## 2015-09-10 MED ORDER — SODIUM CHLORIDE 0.9 % IV SOLN
600.0000 mg/m2 | Freq: Once | INTRAVENOUS | Status: AC
  Administered 2015-09-10: 950 mg via INTRAVENOUS
  Filled 2015-09-10: qty 24.98

## 2015-09-10 MED ORDER — SODIUM CHLORIDE 0.9 % IV SOLN
Freq: Once | INTRAVENOUS | Status: AC
  Administered 2015-09-10: 11:00:00 via INTRAVENOUS

## 2015-09-10 MED ORDER — PEGFILGRASTIM 6 MG/0.6ML ~~LOC~~ PSKT
6.0000 mg | PREFILLED_SYRINGE | Freq: Once | SUBCUTANEOUS | Status: AC
  Administered 2015-09-10: 6 mg via SUBCUTANEOUS
  Filled 2015-09-10: qty 0.6

## 2015-09-10 MED ORDER — SODIUM CHLORIDE 0.9 % IJ SOLN
10.0000 mL | INTRAMUSCULAR | Status: DC | PRN
  Administered 2015-09-10: 10 mL
  Filled 2015-09-10: qty 10

## 2015-09-10 MED ORDER — PROCHLORPERAZINE MALEATE 10 MG PO TABS
10.0000 mg | ORAL_TABLET | Freq: Once | ORAL | Status: AC
  Administered 2015-09-10: 10 mg via ORAL

## 2015-09-10 MED ORDER — PROCHLORPERAZINE MALEATE 10 MG PO TABS
ORAL_TABLET | ORAL | Status: AC
  Filled 2015-09-10: qty 1

## 2015-09-10 NOTE — Progress Notes (Signed)
Patient stated she noticed left sided abdominal pain after removing her on pro last treatment. On pro was located on her right arm. Patient denies the same abdominal pain today. Patient states her last bowel movement was on 09/09/2015. Dr. Marko Plume notified, proceed with treatment and on pro today. Patient verbalized understanding to above mentioned plan.

## 2015-09-10 NOTE — Patient Instructions (Signed)

## 2015-09-10 NOTE — Patient Instructions (Signed)
[facility] Discharge Instructions for Patients Receiving Chemotherapy  Today you received the following chemotherapy agents Gemzar  To help prevent nausea and vomiting after your treatment, we encourage you to take your nausea medication as needed   If you develop nausea and vomiting that is not controlled by your nausea medication, call the clinic.   BELOW ARE SYMPTOMS THAT SHOULD BE REPORTED IMMEDIATELY:  *FEVER GREATER THAN 100.5 F  *CHILLS WITH OR WITHOUT FEVER  NAUSEA AND VOMITING THAT IS NOT CONTROLLED WITH YOUR NAUSEA MEDICATION  *UNUSUAL SHORTNESS OF BREATH  *UNUSUAL BRUISING OR BLEEDING  TENDERNESS IN MOUTH AND THROAT WITH OR WITHOUT PRESENCE OF ULCERS  *URINARY PROBLEMS  *BOWEL PROBLEMS  UNUSUAL RASH Items with * indicate a potential emergency and should be followed up as soon as possible.  Feel free to call the clinic you have any questions or concerns. The clinic phone number is [phone].  Please show the CHEMO ALERT CARD at check-in to the Emergency Department and triage nurse.   

## 2015-09-10 NOTE — Progress Notes (Signed)
Placed for dr. Marko Plume to sign

## 2015-09-11 ENCOUNTER — Encounter: Payer: Self-pay | Admitting: Oncology

## 2015-09-11 LAB — CA 125: CANCER ANTIGEN (CA) 125: 35.6 U/mL (ref 0.0–38.1)

## 2015-09-11 LAB — CANCER ANTIGEN 125 (PARALLEL TESTING): CA 125: 60 U/mL — AB (ref <35)

## 2015-09-11 NOTE — Progress Notes (Signed)
Faxed [phone] and mailed to patient for her copy.

## 2015-09-13 ENCOUNTER — Encounter: Payer: Self-pay | Admitting: Oncology

## 2015-09-13 NOTE — Progress Notes (Signed)
recd form from [employer] in box 09/13/15

## 2015-09-14 ENCOUNTER — Encounter: Payer: Self-pay | Admitting: Oncology

## 2015-09-14 NOTE — Progress Notes (Signed)
Left form for dr. Marko Plume and faxed notes/labs from 05/22/15-present to [employer]

## 2015-09-17 ENCOUNTER — Other Ambulatory Visit: Payer: Self-pay | Admitting: Oncology

## 2015-09-17 ENCOUNTER — Telehealth: Payer: Self-pay

## 2015-09-17 DIAGNOSIS — Z1509 Genetic susceptibility to other malignant neoplasm: Principal | ICD-10-CM

## 2015-09-17 DIAGNOSIS — Z1501 Genetic susceptibility to malignant neoplasm of breast: Principal | ICD-10-CM

## 2015-09-17 DIAGNOSIS — C562 Malignant neoplasm of left ovary: Secondary | ICD-10-CM

## 2015-09-17 NOTE — Telephone Encounter (Signed)
Gave results of the CA-125 from the 2 testing  Methods.  Told her that the each method has given rather different results.  The new method showed a little increase in the marker where as the old method showed a larger increase.   Dr. Marko Plume would like to repeat the CA-125 prior to deciding if a CT scan needs to be done.   Kathleen Bond verbalized understanding. She will have repeat  CA 125 on 09-19-15. Told Kathleen Bond that if she does get the results this week that Dr. Marko Plume will review at 09-24-15.  Kathleen Bond does not have any insurance coverage.  She applied to  For [facility] BS supplement and still does not know what her premium would be.  She called today to follow up. She has the grants = $800.00.  She has received a gas cards and has 1 left.  The cards were $25.00 each.  She stated that Kathleen Bond sent in a medicare application  for [id number] months ago.  Kathleen Bond stated that it would take 3 months to get the coverage. Will send a message to [facility] [place] regarding application as there is no documentation in patient's EMR.

## 2015-09-17 NOTE — Telephone Encounter (Signed)
-----   Message from Gordy Levan, MD sent at 09/17/2015  8:32 AM EST ----- I have changed plan from result note. I would like CA 125 repeat first  Please also ask patient if she has coverage now for scans. If not, I would like [place] financial staff to talk to her and let me know status

## 2015-09-19 ENCOUNTER — Encounter: Payer: Self-pay | Admitting: Oncology

## 2015-09-19 ENCOUNTER — Other Ambulatory Visit (HOSPITAL_BASED_OUTPATIENT_CLINIC_OR_DEPARTMENT_OTHER): Payer: Self-pay

## 2015-09-19 DIAGNOSIS — Z1509 Genetic susceptibility to other malignant neoplasm: Principal | ICD-10-CM

## 2015-09-19 DIAGNOSIS — Z1501 Genetic susceptibility to malignant neoplasm of breast: Principal | ICD-10-CM

## 2015-09-19 DIAGNOSIS — C561 Malignant neoplasm of right ovary: Secondary | ICD-10-CM

## 2015-09-19 DIAGNOSIS — C562 Malignant neoplasm of left ovary: Secondary | ICD-10-CM

## 2015-09-19 LAB — CBC WITH DIFFERENTIAL/PLATELET
BASO%: 0.1 % (ref 0.0–2.0)
BASOS ABS: 0 10*3/uL (ref 0.0–0.1)
EOS ABS: 0 10*3/uL (ref 0.0–0.5)
EOS%: 0 % (ref 0.0–7.0)
HCT: 30.8 % — ABNORMAL LOW (ref 34.8–46.6)
HEMOGLOBIN: 10 g/dL — AB (ref 11.6–15.9)
LYMPH#: 2.9 10*3/uL (ref 0.9–3.3)
LYMPH%: 13.3 % — ABNORMAL LOW (ref 14.0–49.7)
MCH: 31.9 pg (ref 25.1–34.0)
MCHC: 32.5 g/dL (ref 31.5–36.0)
MCV: 98.4 fL (ref 79.5–101.0)
MONO#: 1.6 10*3/uL — ABNORMAL HIGH (ref 0.1–0.9)
MONO%: 7.3 % (ref 0.0–14.0)
NEUT#: 17.3 10*3/uL — ABNORMAL HIGH (ref 1.5–6.5)
NEUT%: 79.3 % — ABNORMAL HIGH (ref 38.4–76.8)
NRBC: 0 % (ref 0–0)
PLATELETS: 211 10*3/uL (ref 145–400)
RBC: 3.13 10*6/uL — AB (ref 3.70–5.45)
RDW: 14.8 % — AB (ref 11.2–14.5)
WBC: 21.9 10*3/uL — ABNORMAL HIGH (ref 3.9–10.3)

## 2015-09-19 LAB — COMPREHENSIVE METABOLIC PANEL
AST: 9 U/L (ref 5–34)
Albumin: 3.6 g/dL (ref 3.5–5.0)
Alkaline Phosphatase: 129 U/L (ref 40–150)
Anion Gap: 9 mEq/L (ref 3–11)
BUN: 11.8 mg/dL (ref 7.0–26.0)
CALCIUM: 9.4 mg/dL (ref 8.4–10.4)
CHLORIDE: 102 meq/L (ref 98–109)
CO2: 27 meq/L (ref 22–29)
CREATININE: 0.8 mg/dL (ref 0.6–1.1)
EGFR: 90 mL/min/{1.73_m2} — ABNORMAL LOW (ref >90)
Glucose: 91 mg/dl (ref 70–140)
Potassium: 4.1 mEq/L (ref 3.5–5.1)
Sodium: 137 mEq/L (ref 136–145)
TOTAL PROTEIN: 7.9 g/dL (ref 6.4–8.3)

## 2015-09-19 NOTE — Progress Notes (Signed)
Met with patient in lobby area while she was waiting to have labs drawn. Asked her the status of her BCBS plan that was suppose to begin 07/22/15. Patient states this was through the Marketplace  And she had been going back and forth with them because she hadn't received any information. He was told she hadn't sent in a premium and that her application had been sent to Medicaid today 09/19/15. She was under the impression that I had already submitted a Medicaid application for her but I explained it has to be turned in to DSS for her county which is [place] and I didn't have this information in my notes or a hard copy. I think she was getting this confused with the [facility] FA application and documents to go with it. She was told by Marketplace if denied for Medicaid or do not hear back from them to contact marketplace  back immediately but should hear from [facility] within 30 days. Patient knows to contact me as well with the decision of Medicaid.

## 2015-09-20 ENCOUNTER — Encounter: Payer: Self-pay | Admitting: Oncology

## 2015-09-20 LAB — CA 125: Cancer Antigen (CA) 125: 39.4 U/mL — ABNORMAL HIGH (ref 0.0–38.1)

## 2015-09-20 NOTE — Progress Notes (Signed)
Gave Raquel a copy of income verification for pt for financial assistance for tx.

## 2015-09-20 NOTE — Progress Notes (Signed)
Will enroll patient with amgen for poss ass with neulasta-uninsured.

## 2015-09-21 ENCOUNTER — Telehealth: Payer: Self-pay

## 2015-09-21 NOTE — Telephone Encounter (Signed)
S/w pt that Dr Edwyna Shell says cancer marker is essentially unchanged and Dr Edwyna Shell will see her 3/6. Pt said "thank you"

## 2015-09-21 NOTE — Telephone Encounter (Signed)
-----   Message from Gordy Levan, MD sent at 09/17/2015  8:32 AM EST ----- I have changed plan from result note. I would like CA 125 repeat first  Please also ask patient if she has coverage now for scans. If not, I would like [place] financial staff to talk to her and let me know status

## 2015-09-23 ENCOUNTER — Other Ambulatory Visit: Payer: Self-pay | Admitting: Oncology

## 2015-09-23 DIAGNOSIS — Z1509 Genetic susceptibility to other malignant neoplasm: Principal | ICD-10-CM

## 2015-09-23 DIAGNOSIS — C561 Malignant neoplasm of right ovary: Secondary | ICD-10-CM

## 2015-09-23 DIAGNOSIS — Z1501 Genetic susceptibility to malignant neoplasm of breast: Principal | ICD-10-CM

## 2015-09-24 ENCOUNTER — Other Ambulatory Visit: Payer: Self-pay

## 2015-09-24 ENCOUNTER — Ambulatory Visit (HOSPITAL_BASED_OUTPATIENT_CLINIC_OR_DEPARTMENT_OTHER): Payer: Self-pay

## 2015-09-24 ENCOUNTER — Encounter: Payer: Self-pay | Admitting: Oncology

## 2015-09-24 ENCOUNTER — Ambulatory Visit: Payer: Self-pay

## 2015-09-24 ENCOUNTER — Telehealth: Payer: Self-pay | Admitting: Oncology

## 2015-09-24 ENCOUNTER — Ambulatory Visit (HOSPITAL_BASED_OUTPATIENT_CLINIC_OR_DEPARTMENT_OTHER): Payer: Self-pay | Admitting: Oncology

## 2015-09-24 VITALS — BP 124/75 | HR 80 | Temp 98.3°F | Resp 18 | Ht 62.0 in | Wt 134.1 lb

## 2015-09-24 DIAGNOSIS — C562 Malignant neoplasm of left ovary: Secondary | ICD-10-CM

## 2015-09-24 DIAGNOSIS — Z5111 Encounter for antineoplastic chemotherapy: Secondary | ICD-10-CM

## 2015-09-24 DIAGNOSIS — Z1509 Genetic susceptibility to other malignant neoplasm: Principal | ICD-10-CM

## 2015-09-24 DIAGNOSIS — C561 Malignant neoplasm of right ovary: Secondary | ICD-10-CM

## 2015-09-24 DIAGNOSIS — Z1501 Genetic susceptibility to malignant neoplasm of breast: Principal | ICD-10-CM

## 2015-09-24 DIAGNOSIS — Z5189 Encounter for other specified aftercare: Secondary | ICD-10-CM

## 2015-09-24 DIAGNOSIS — C569 Malignant neoplasm of unspecified ovary: Secondary | ICD-10-CM

## 2015-09-24 DIAGNOSIS — C50912 Malignant neoplasm of unspecified site of left female breast: Secondary | ICD-10-CM

## 2015-09-24 DIAGNOSIS — D701 Agranulocytosis secondary to cancer chemotherapy: Secondary | ICD-10-CM

## 2015-09-24 DIAGNOSIS — Z853 Personal history of malignant neoplasm of breast: Secondary | ICD-10-CM

## 2015-09-24 DIAGNOSIS — Z95828 Presence of other vascular implants and grafts: Secondary | ICD-10-CM

## 2015-09-24 DIAGNOSIS — D72819 Decreased white blood cell count, unspecified: Secondary | ICD-10-CM

## 2015-09-24 DIAGNOSIS — T451X5A Adverse effect of antineoplastic and immunosuppressive drugs, initial encounter: Secondary | ICD-10-CM

## 2015-09-24 DIAGNOSIS — D6481 Anemia due to antineoplastic chemotherapy: Secondary | ICD-10-CM

## 2015-09-24 LAB — CBC WITH DIFFERENTIAL/PLATELET
BASO%: 0.3 % (ref 0.0–2.0)
Basophils Absolute: 0 10*3/uL (ref 0.0–0.1)
EOS ABS: 0 10*3/uL (ref 0.0–0.5)
EOS%: 0.3 % (ref 0.0–7.0)
HEMATOCRIT: 30.9 % — AB (ref 34.8–46.6)
HGB: 10.1 g/dL — ABNORMAL LOW (ref 11.6–15.9)
LYMPH#: 1.6 10*3/uL (ref 0.9–3.3)
LYMPH%: 40.6 % (ref 14.0–49.7)
MCH: 31.9 pg (ref 25.1–34.0)
MCHC: 32.7 g/dL (ref 31.5–36.0)
MCV: 97.5 fL (ref 79.5–101.0)
MONO#: 0.5 10*3/uL (ref 0.1–0.9)
MONO%: 14.1 % — ABNORMAL HIGH (ref 0.0–14.0)
NEUT#: 1.7 10*3/uL (ref 1.5–6.5)
NEUT%: 44.7 % (ref 38.4–76.8)
Platelets: 358 10*3/uL (ref 145–400)
RBC: 3.17 10*6/uL — ABNORMAL LOW (ref 3.70–5.45)
RDW: 14.4 % (ref 11.2–14.5)
WBC: 3.8 10*3/uL — ABNORMAL LOW (ref 3.9–10.3)

## 2015-09-24 MED ORDER — PROCHLORPERAZINE MALEATE 10 MG PO TABS
10.0000 mg | ORAL_TABLET | Freq: Once | ORAL | Status: AC
  Administered 2015-09-24: 10 mg via ORAL

## 2015-09-24 MED ORDER — LORAZEPAM 0.5 MG PO TABS: 0.5000 mg | ORAL_TABLET | Freq: Four times a day (QID) | ORAL | Status: DC | PRN

## 2015-09-24 MED ORDER — HEPARIN SOD (PORK) LOCK FLUSH 100 UNIT/ML IV SOLN
500.0000 [IU] | Freq: Once | INTRAVENOUS | Status: AC | PRN
  Administered 2015-09-24: 500 [IU]
  Filled 2015-09-24: qty 5

## 2015-09-24 MED ORDER — PROCHLORPERAZINE MALEATE 10 MG PO TABS
ORAL_TABLET | ORAL | Status: AC
  Filled 2015-09-24: qty 1

## 2015-09-24 MED ORDER — GEMCITABINE HCL CHEMO INJECTION 1 GM/26.3ML
600.0000 mg/m2 | Freq: Once | INTRAVENOUS | Status: AC
  Administered 2015-09-24: 950 mg via INTRAVENOUS
  Filled 2015-09-24: qty 24.98

## 2015-09-24 MED ORDER — ACETAMINOPHEN 325 MG PO TABS
325.0000 mg | ORAL_TABLET | Freq: Once | ORAL | Status: AC
  Administered 2015-09-24: 325 mg via ORAL

## 2015-09-24 MED ORDER — TRAMADOL HCL 50 MG PO TABS: 50.0000 mg | ORAL_TABLET | Freq: Four times a day (QID) | ORAL | Status: DC | PRN

## 2015-09-24 MED ORDER — ACETAMINOPHEN 325 MG PO TABS
ORAL_TABLET | ORAL | Status: AC
  Filled 2015-09-24: qty 1

## 2015-09-24 MED ORDER — SODIUM CHLORIDE 0.9 % IJ SOLN
10.0000 mL | INTRAMUSCULAR | Status: DC | PRN
  Administered 2015-09-24: 10 mL
  Filled 2015-09-24: qty 10

## 2015-09-24 MED ORDER — ONDANSETRON HCL 8 MG PO TABS: 8.0000 mg | ORAL_TABLET | Freq: Three times a day (TID) | ORAL | Status: DC | PRN

## 2015-09-24 MED ORDER — SODIUM CHLORIDE 0.9 % IV SOLN
Freq: Once | INTRAVENOUS | Status: AC
  Administered 2015-09-24: 10:00:00 via INTRAVENOUS

## 2015-09-24 MED ORDER — SODIUM CHLORIDE 0.9% FLUSH
10.0000 mL | INTRAVENOUS | Status: DC | PRN
  Administered 2015-09-24: 10 mL via INTRAVENOUS
  Filled 2015-09-24: qty 10

## 2015-09-24 MED ORDER — PEGFILGRASTIM 6 MG/0.6ML ~~LOC~~ PSKT
6.0000 mg | PREFILLED_SYRINGE | Freq: Once | SUBCUTANEOUS | Status: AC
Start: 2015-09-24 — End: 2015-09-24
  Administered 2015-09-24: 6 mg via SUBCUTANEOUS
  Filled 2015-09-24: qty 0.6

## 2015-09-24 NOTE — Progress Notes (Signed)
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 She continues every other week gemzar with neulasta by on pro injector, however has slight increase in CA 125 marker. She is BRCA 2 positive, previous left breast cancer. Situation is complicated by lack of insurance presently, with Medicaid application submitted 09-24-88 pending (may take 3-4 weeks) and [employer] dependent on Medicaid decision. [place] financial counselors are involved.   Patient has noticed slight indigestion and slightly increased bowel gas, with bowels still moving daily but a little more difficult despite ongoing stool softener once daily. Appetite and energy are good. No abdominal or pelvic pain or distension. No noted changes in either breast. No problems with PAC. No bleeding. No fever including after gemzar, does have some aching after neulasta. No SOB or cough.  Remainder of 10 point review of systems negative.   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  ONCOLOGIC HISTORY Left ovarian cancer: Patient became symptomatic with abdominal swelling and constipation in late Nov 2015, seen and treated initially for what was thought to be ulcer. Symptoms continued, with early satiety causing poor po intake; she was seen again at [facility] ED in early Jan 2016, with CT reportedly consistent with stage IIIC or IV gyn cancer, including omental caking, large pelvic mass, para-aortic adenopathy, ascites, pleural effusions and right subpleural nodule. She was  seen in consultation by Dr Denman George on 07-25-14, with concern from her exam and scan for rectal involvement. 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 She had paracentesis of 3.4 liters ascites on 07-27-2014, with cytology ([id number]) showing serous carcinoma consistent with gyn primary, ER negative. She had right thoracentesis for 350 cc on 08-11-14, this requested by anesthesia prior to surgery and apparently not sent for cytology. Patient was more comfortable after paracentesis, but could not tell any difference in breathing after thoracentesis (denies SOB prior to that procedure). CA 125 on 08-02-14 was 1162. She was taken to exploratory laparotomy by Dr Denman George on 08-15-14, with 4 liters of ascites removed and biopsy of omentum obtained, with additional debulking not attempted due to surgical findings of entire peritoneal surface replaced by tumor plaque, small intestine coated with tumor and mesentery tethered with tumor nodules, ileum adherent to pelvic mass, dense plaque of tumor on right diaphragm and 10 cm omental cake in LUQ. Pathology of omental biopsy She needed another paracentesis by IR for 1.3 liters ascites on 08-17-14, and had PAC placed by IR also 08-17-14. Day 1 cycle 1 dose dense carboplatin taxol was given in hospital on 08-18-14. After 2 cycles of dose dense regimen, the carboplatin and taxol was changed to every 3 week regimen beginning cycle 3; she had cycle 5 on 11-09-14, with gCSF support. She had interval debulking by Dr Denman George on 11-28-14. Surgery was exploratory laparotomy with TAH/ BSO, omentectomy and radical tumor debulking; R1 resection with residual milial tumor on small bowel at completion of procedure. Tumor involved bilateral adnexa, uterine serosa, omentum  and falciform ligament, with + LVSI, no nodes examined. Maximum tumor size was 4.3 cm involving left ovary. She resumed carboplatin Taxol on 12-21-14, total of 3 cycles thru  02-01-15 (= 3 cycles after interval debulking). Restaging CT CAP 03-12-15 had some changes concerning for residual or recurrent disease, and CA 125 was elevated to 64 on 03-14-15, having been 6 in July 2016.  Review of prior radiation and chemotherapy (for left breast cancer) significant for use of epirubicin and some unquantified cardiac exposure to radiation. She began gemcitabine 04-09-15. She was neutropenic by 04-16-15. Regimen was changed to every other week gemzar with neulasta support, gemzar dose also decreased beginning cycle 2.  BRCA 2 abnormality identified 10-12-14.  Breast cancer: Left breast biopsy 11-22-2003 ([id number]) invasive carcinoma. Left breast wide excision with sentinel nodes and axillary contents by Dr Benson Norway 01-03-2004 [phone]) poorly differentiated invasive ductal carcinoma 3.0 cm with closest margin 1.7 cm deep, 6 sentinel nodes + 3 additional nodes all negative, ER 0, PR 0, HER 2 3+, Ki67 of 19%. . She received adjuvant chemotherapy by Dr Sonny Dandy with 5FU, epirubicin, cytoxan x 4 cycles from 02-13-2004 thru 04-16-2004, followed by taxol 80 mg/m2 weekly x 11 from 05-07-2004 thru 09-04-2004, and herceptin 05-21-2004 thru 02-25-2005. Antiemetics were Kytril and decadron. She had PAC for the chemotherapy. She had radiation by Dr Tyler Pita in [place]. Last note from Dr Sonny Dandy in this information was 04-2008. Left diagnostic mammogram [facility] 10-25-2014 stable compared with 12-06-13 and priors; bilateral at [facility] 02-13-15. Breast MRI 05-25-15 had no evidence of malignancy there, this medically necessary with BRCA 2 mutation.   Objective:  Vital signs in last 24 hours:  BP 124/75 mmHg  Pulse 80  Temp(Src) 98.3 F (36.8 C) (Oral)  Resp 18  Ht 5' 2" (1.575 m)  Wt 134 lb 1.6 oz (60.827 kg)  BMI 24.52 kg/m2  SpO2 99%  weight up 1 lb. She looks very comfortable, very pleasant as always Alert, oriented and appropriate. Ambulatory without difficulty.  Partial  alopecia  HEENT:PERRL, sclerae not icteric. Oral mucosa moist without lesions, posterior pharynx clear.  Neck supple. No JVD.  Lymphatics:no cervical,supraclavicular, axillary or inguinal adenopathy Resp: clear to auscultation bilaterally and normal percussion bilaterally Cardio: regular rate and rhythm. No gallop. GI: soft, nontender, not distended, no mass or organomegaly. A few bowel sounds. Surgical incision not remarkable. Musculoskeletal/ Extremities: without pitting edema, cords, tenderness Neuro: no increase peripheral neuropathy. Otherwise nonfocal. PSYCH appropriate mood and affect Skin without rash, ecchymosis, petechiae Breasts: thickening left superior breast stable, otherwise bilaterally without dominant mass, skin or nipple findings. Axillae benign. Portacath-without erythema or tenderness  Lab Results:  Results for orders placed or performed in visit on 09/24/15  CBC with Differential  Result Value Ref Range   WBC 3.8 (L) 3.9 - 10.3 10e3/uL   NEUT# 1.7 1.5 - 6.5 10e3/uL   HGB 10.1 (L) 11.6 - 15.9 g/dL   HCT 30.9 (L) 34.8 - 46.6 %   Platelets 358 145 - 400 10e3/uL   MCV 97.5 79.5 - 101.0 fL   MCH 31.9 25.1 - 34.0 pg   MCHC 32.7 31.5 - 36.0 g/dL   RBC 3.17 (L) 3.70 - 5.45 10e6/uL   RDW 14.4 11.2 - 14.5 %   lymph# 1.6 0.9 - 3.3 10e3/uL   MONO# 0.5 0.1 - 0.9 10e3/uL   Eosinophils Absolute 0.0 0.0 - 0.5 10e3/uL   Basophils Absolute 0.0 0.0 - 0.1 10e3/uL   NEUT% 44.7 38.4 - 76.8 %  LYMPH% 40.6 14.0 - 49.7 %   MONO% 14.1 (H) 0.0 - 14.0 %   EOS% 0.3 0.0 - 7.0 %   BASO% 0.3 0.0 - 2.0 %   Recent CA 125 values as in interval history above  Studies/Results:  No results found.  Medications: I have reviewed the patient's current medications. Increase stool softener to bid. OK to use miralax or other OTC laxative daily prn to keep bowels moving well.   DISCUSSION Marker seems to be trending up and vague symptoms could be related to more disease activity. She would be  appropriate for oral olaparib Lonie Peak) as next intervention. I would prefer to repeat scans prior to change in therapy. Out of pocket costs / pending insurance decisions need to be considered, as fortunately she seems minimally symptomatic at this point. Discussed with patient. Message to [facility] pharmacist for oral treatments. For now will give gemzar as planned today. I will see her again on 3-20, with scans as soon as possible depending on insurance or regardless of insurance if significant symptoms  Communication to MD from financial staff: "Unfortunately there is not a way to expedite the Medicaid process. However, Medicaid will retro back if approved. I have approved her for 100% financial assistance discount through the [facility].    As far as her scans are concerned, Darlena is going to check to see if this same discount can be used for her treatment on the hospital side but this would not include outside billing areas such as [facility];etc.  If Medicaid is denied, there is an appeal process which can be followed. Also if denied, [employer] wants the patient to follow up with them. Whether or not they will pick up the bills would depend on the date they approve her and may not retro back.    I have attached Raquel if there are any adjustments that need to be made as far as drug replacement.  I met with Ms.Hogue to give a copy of her FA approval and to explain to her the status of this process. "     Assessment/Plan: 1. 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 Rapid recurrence/ progression of gyn cancer within 4 weeks of completing that chemo. Paracentesis confirmed serous malignancy.Clinically responded initially to gemzar but may be showing early progression now. Scans + olaparib as soon as  feasible.  2.BRCA 2 abnormality.  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 Bilateral mammograms at [facility] done 02-13-15, breast MRI done 05-25-15, no evidence of active malignancy then. 4.PAC in 5.chemo and iron deficiency anemia: hemoglobin a little better, continue oral iron. 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 Previous epirubicin, some cardiac exposure from left breast radiation. BP better now 8.hypothyroidism on replacement 9.post parathyroidectomy 2005 10.insurance concerns: appreciate help from [facility] staff. Will try to wait a couple of weeks, tho note she has had rapid progression of active disease previously. 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 On Pro system very useful given travel distance.  All questions answered and she knows to call if concerns prior to next scheduled visit . Chemo and neulasta orders confirmed. Time spent 25 min including >50% counseling and coordination of care. Cc PCP   Gordy Levan, MD   09/26/2015, 12:27 PM

## 2015-09-24 NOTE — Telephone Encounter (Signed)
appt made and avs printed °

## 2015-09-26 DIAGNOSIS — Z95828 Presence of other vascular implants and grafts: Secondary | ICD-10-CM | POA: Insufficient documentation

## 2015-09-27 ENCOUNTER — Encounter: Payer: Self-pay | Admitting: Pharmacist

## 2015-09-27 NOTE — Progress Notes (Signed)
Oral Chemotherapy Pharmacist Encounter   I spoke with patient for overview of new oral chemotherapy medication: Lynparza (olaparib). Pt is doing well. Pt does not have insurance but Medicaid is pending. If patient decides to be treated with Lonie Peak we will process the rx depending on insurance. If Medicaid is approved then the cost will be ~$3 at [facility]. If pt is denied for Medicaid we will apply for free drug assistance through the manufacture as an uninsured patient.   Counseled patient on administration, dosing, side effects, safe handling, and monitoring. Side effects include but not limited to: Fatigue, low blood counts, rare pulmonary infections, headache, abdominal pain, and nausea/vomiting, and diarrhea.  Ms. Pfohl voiced understanding and appreciation. She will think about her options and discuss at her appointment on 3/20. I also discussed with Ms. Zigmund Daniel that this medication only comes in 50 mg capsules and the dose is 400 mg twice daily (that is 8 capsules in the morning and 8 capsules in the evening for 16 capsules total for the day).   All questions answered.  Will follow up with patient regarding insurance and pharmacy and patient decision for moving forward with [place]   Thank you,  Montel Clock, PharmD, [facility]

## 2015-10-03 ENCOUNTER — Encounter: Payer: Self-pay | Admitting: Oncology

## 2015-10-03 NOTE — Progress Notes (Signed)
See prev notes. Medicaid is pending.

## 2015-10-07 ENCOUNTER — Other Ambulatory Visit: Payer: Self-pay | Admitting: Oncology

## 2015-10-08 ENCOUNTER — Encounter: Payer: Self-pay | Admitting: Oncology

## 2015-10-08 ENCOUNTER — Ambulatory Visit: Payer: Self-pay

## 2015-10-08 ENCOUNTER — Ambulatory Visit (HOSPITAL_BASED_OUTPATIENT_CLINIC_OR_DEPARTMENT_OTHER): Payer: Self-pay

## 2015-10-08 ENCOUNTER — Ambulatory Visit (HOSPITAL_BASED_OUTPATIENT_CLINIC_OR_DEPARTMENT_OTHER): Payer: Self-pay | Admitting: Oncology

## 2015-10-08 ENCOUNTER — Telehealth: Payer: Self-pay | Admitting: Oncology

## 2015-10-08 VITALS — BP 122/75 | HR 91 | Temp 98.6°F | Resp 18 | Ht 62.0 in | Wt 133.7 lb

## 2015-10-08 DIAGNOSIS — C50912 Malignant neoplasm of unspecified site of left female breast: Secondary | ICD-10-CM

## 2015-10-08 DIAGNOSIS — Z1501 Genetic susceptibility to malignant neoplasm of breast: Principal | ICD-10-CM

## 2015-10-08 DIAGNOSIS — E039 Hypothyroidism, unspecified: Secondary | ICD-10-CM

## 2015-10-08 DIAGNOSIS — C569 Malignant neoplasm of unspecified ovary: Secondary | ICD-10-CM

## 2015-10-08 DIAGNOSIS — Z1509 Genetic susceptibility to other malignant neoplasm: Principal | ICD-10-CM

## 2015-10-08 DIAGNOSIS — Z95828 Presence of other vascular implants and grafts: Secondary | ICD-10-CM

## 2015-10-08 DIAGNOSIS — T451X5A Adverse effect of antineoplastic and immunosuppressive drugs, initial encounter: Secondary | ICD-10-CM

## 2015-10-08 DIAGNOSIS — R180 Malignant ascites: Secondary | ICD-10-CM

## 2015-10-08 DIAGNOSIS — G620 Drug-induced polyneuropathy: Secondary | ICD-10-CM

## 2015-10-08 DIAGNOSIS — C561 Malignant neoplasm of right ovary: Secondary | ICD-10-CM

## 2015-10-08 DIAGNOSIS — C562 Malignant neoplasm of left ovary: Secondary | ICD-10-CM

## 2015-10-08 LAB — COMPREHENSIVE METABOLIC PANEL
ALBUMIN: 3.5 g/dL (ref 3.5–5.0)
ALK PHOS: 94 U/L (ref 40–150)
AST: 8 U/L (ref 5–34)
Anion Gap: 8 mEq/L (ref 3–11)
BUN: 15.4 mg/dL (ref 7.0–26.0)
CALCIUM: 9.3 mg/dL (ref 8.4–10.4)
CO2: 27 mEq/L (ref 22–29)
CREATININE: 0.7 mg/dL (ref 0.6–1.1)
Chloride: 104 mEq/L (ref 98–109)
EGFR: >90 mL/min/{1.73_m2} (ref >90)
Glucose: 95 mg/dl (ref 70–140)
Potassium: 4.1 mEq/L (ref 3.5–5.1)
Sodium: 138 mEq/L (ref 136–145)
Total Bilirubin: <0.3 mg/dL (ref 0.20–1.20)
Total Protein: 7.8 g/dL (ref 6.4–8.3)

## 2015-10-08 LAB — CBC WITH DIFFERENTIAL/PLATELET
BASO%: 0.4 % (ref 0.0–2.0)
BASOS ABS: 0 10*3/uL (ref 0.0–0.1)
EOS ABS: 0 10*3/uL (ref 0.0–0.5)
EOS%: 0.1 % (ref 0.0–7.0)
HCT: 31.2 % — ABNORMAL LOW (ref 34.8–46.6)
HGB: 10.1 g/dL — ABNORMAL LOW (ref 11.6–15.9)
LYMPH#: 1.5 10*3/uL (ref 0.9–3.3)
LYMPH%: 24.7 % (ref 14.0–49.7)
MCH: 30.8 pg (ref 25.1–34.0)
MCHC: 32.3 g/dL (ref 31.5–36.0)
MCV: 95.6 fL (ref 79.5–101.0)
MONO#: 0.6 10*3/uL (ref 0.1–0.9)
MONO%: 9.9 % (ref 0.0–14.0)
NEUT#: 4.1 10*3/uL (ref 1.5–6.5)
NEUT%: 64.9 % (ref 38.4–76.8)
Platelets: 331 10*3/uL (ref 145–400)
RBC: 3.26 10*6/uL — AB (ref 3.70–5.45)
RDW: 15.5 % — AB (ref 11.2–14.5)
WBC: 6.3 10*3/uL (ref 3.9–10.3)

## 2015-10-08 MED ORDER — HEPARIN SOD (PORK) LOCK FLUSH 100 UNIT/ML IV SOLN
500.0000 [IU] | Freq: Once | INTRAVENOUS | Status: AC
  Administered 2015-10-08: 500 [IU] via INTRAVENOUS
  Filled 2015-10-08: qty 5

## 2015-10-08 MED ORDER — SODIUM CHLORIDE 0.9% FLUSH
10.0000 mL | INTRAVENOUS | Status: DC | PRN
  Administered 2015-10-08: 10 mL via INTRAVENOUS
  Filled 2015-10-08: qty 10

## 2015-10-08 NOTE — Patient Instructions (Signed)

## 2015-10-08 NOTE — Telephone Encounter (Signed)
appt made and avs printed °

## 2015-10-08 NOTE — Progress Notes (Signed)
Patient stopped me in the lobby to show me a letter from her caseworker at [facility] regarding Medicaid. The letter states for the patent to contact her regarding health insurance enrollment. Advised patient she would need to contact the caseworker to see what she needs and may give her my number if she had any other questions about her account balance. Patient verbalized understanding.

## 2015-10-08 NOTE — Progress Notes (Signed)
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 She is BRCA 2 positive, previous left breast cancer.   Patient most recently has been treated with gemzar from 04-09-15 thru 09-24-15. She responded well to this regimen initially, however had slight increase in CA 125 marker in 08-2015, which continues to increase. She has also noticed some abdominal fullness in last 2 weeks, with mild early satiety, as she had with rapidly developing malignant ascites intially.  Bowels are moving with stool softener, no vomiting, minimal nausea. She denies SOB, abdominal pain, LE swelling, any bleeding, any fever. She is not aware of any changes in breasts bilaterally. No problems with PAC. No symptoms of infection. Fairly minimal peripheral neuropathy now, not bothersome.  Remainder of 10 point Review of Systems negative.   PAC in  BRCA 2 mutation (p.[id number]*) by [employer] 10-12-14 Flu vaccine done 04-02-15 CA 125 on 04-09-15 baseline for gemzar 313, down to 27 in Jan 2017, then 35 in Feb and 39 early Jan 2017   ONCOLOGIC HISTORY Left ovarian cancer: Patient became symptomatic with abdominal swelling and constipation in late Nov 2015, seen and treated initially for what was thought to be ulcer. Symptoms continued, with early satiety causing poor po intake; she was seen again at [facility] ED in early Jan 2016, with CT reportedly consistent with stage IIIC or IV gyn cancer, including omental caking, large pelvic mass, para-aortic adenopathy, ascites, pleural effusions and right subpleural nodule. She was seen in consultation by Dr Rossi on 07-25-14, with concern from her exam and scan for rectal involvement. She had preoperative cardiology consultation with Dr Philip Nahser, with no  interventions needed (RBBB and LAFB). She had paracentesis of 3.4 liters ascites on 07-27-2014, with cytology ([id number]) showing serous carcinoma consistent with gyn primary, ER negative. She had right thoracentesis for 350 cc on 08-11-14, this requested by anesthesia prior to surgery and apparently not sent for cytology. Patient was more comfortable after paracentesis, but could not tell any difference in breathing after thoracentesis (denies SOB prior to that procedure). CA 125 on 08-02-14 was 1162. She was taken to exploratory laparotomy by Dr Rossi on 08-15-14, with 4 liters of ascites removed and biopsy of omentum obtained, with additional debulking not attempted due to surgical findings of entire peritoneal surface replaced by tumor plaque, small intestine coated with tumor and mesentery tethered with tumor nodules, ileum adherent to pelvic mass, dense plaque of tumor on right diaphragm and 10 cm omental cake in LUQ. Pathology of omental biopsy She needed another paracentesis by IR for 1.3 liters ascites on 08-17-14, and had PAC placed by IR also 08-17-14. Day 1 cycle 1 dose dense carboplatin taxol was given in hospital on 08-18-14. After 2 cycles of dose dense regimen, the carboplatin and taxol was changed to every 3 week regimen beginning cycle 3; she had cycle 5 on 11-09-14, with gCSF support. She had interval debulking by Dr Rossi on 11-28-14. Surgery was exploratory laparotomy with TAH/ BSO, omentectomy and radical tumor debulking; R1 resection with residual milial tumor on small bowel at completion of procedure. Tumor involved bilateral adnexa, uterine serosa, omentum and falciform ligament, with + LVSI, no nodes examined. Maximum tumor size was 4.3 cm involving left ovary. She resumed carboplatin Taxol on 12-21-14, total of 3 cycles thru   02-01-15 (= 3 cycles after interval debulking). Restaging CT CAP 03-12-15 had some changes concerning for residual or recurrent disease, and CA 125 was elevated to 64 on 03-14-15,  having been 6 in July 2016.  Review of prior radiation and chemotherapy (for left breast cancer) significant for use of epirubicin and some unquantified cardiac exposure to radiation. She began gemcitabine 04-09-15. She was neutropenic by 04-16-15. Regimen was changed to every other week gemzar with neulasta support, gemzar dose also decreased beginning cycle 2. CA 125 on 04-09-15 baseline for gemzar 313, down to 27 in Jan 2017, then 35 in Feb and 39 early Jan 2017. She continued gemzar thru 09-24-15.  BRCA 2 abnormality identified 10-12-14.  Breast cancer: Left breast biopsy 11-22-2003 ([id number]) invasive carcinoma. Left breast wide excision with sentinel nodes and axillary contents by Dr Henry Fleishman 01-03-2004 ([id number]) poorly differentiated invasive ductal carcinoma 3.0 cm with closest margin 1.7 cm deep, 6 sentinel nodes + 3 additional nodes all negative, ER 0, PR 0, HER 2 3+, Ki67 of 19%. . She received adjuvant chemotherapy by Dr Karb with 5FU, epirubicin, cytoxan x 4 cycles from 02-13-2004 thru 04-16-2004, followed by taxol 80 mg/m2 weekly x 11 from 05-07-2004 thru 09-04-2004, and herceptin 05-21-2004 thru 02-25-2005. Antiemetics were Kytril and decadron. She had PAC for the chemotherapy. She had radiation by Dr Matthew Manning in [place]. Last note from Dr Karb in this information was 04-2008. Left diagnostic mammogram [facility] 10-25-2014 stable compared with 12-06-13 and priors; bilateral at [facility] 02-13-15. Breast MRI 05-25-15 had no evidence of malignancy there, this medically necessary with BRCA 2 mutation.   Objective:  Vital signs in last 24 hours:  BP 122/75 mmHg  Pulse 91  Temp(Src) 98.6 F (37 C) (Oral)  Resp 18  Ht 5' 2" (1.575 m)  Wt 133 lb 11.2 oz (60.646 kg)  BMI 24.45 kg/m2  SpO2 98% Weight stable Alert, oriented and appropriate. Ambulatory without difficulty.  No alopecia  HEENT:PERRL, sclerae not icteric. Oral mucosa moist without lesions, posterior pharynx clear.  Neck supple.  No JVD.  Lymphatics:no cervical,supraclavicular, axillary or inguinal adenopathy Resp: clear to auscultation bilaterally and normal percussion bilaterally Cardio: regular rate and rhythm. No gallop. GI: soft, nontender, mildly distended moreso than when I saw her on 09-26-15, no clear mass or organomegaly. Some bowel sounds. Surgical incision not remarkable. Musculoskeletal/ Extremities: without pitting edema, cords, tenderness Neuro: no increased peripheral neuropathy. Otherwise nonfocal. PSYCH appropriate mood and affect Skin without rash, ecchymosis, petechiae Breasts: thickening left superior breast unchanged, otherwise without dominant mass, skin or nipple findings. Axillae benign. Portacath-without erythema or tenderness  Lab Results:  Results for orders placed or performed in visit on 10/08/15  CBC with Differential  Result Value Ref Range   WBC 6.3 3.9 - 10.3 10e3/uL   NEUT# 4.1 1.5 - 6.5 10e3/uL   HGB 10.1 (L) 11.6 - 15.9 g/dL   HCT 31.2 (L) 34.8 - 46.6 %   Platelets 331 145 - 400 10e3/uL   MCV 95.6 79.5 - 101.0 fL   MCH 30.8 25.1 - 34.0 pg   MCHC 32.3 31.5 - 36.0 g/dL   RBC 3.26 (L) 3.70 - 5.45 10e6/uL   RDW 15.5 (H) 11.2 - 14.5 %   lymph# 1.5 0.9 - 3.3 10e3/uL   MONO# 0.6 0.1 - 0.9 10e3/uL   Eosinophils Absolute 0.0 0.0 - 0.5 10e3/uL   Basophils Absolute 0.0 0.0 - 0.1 10e3/uL   NEUT% 64.9 38.4 - 76.8 %   LYMPH% 24.7 14.0 -   49.7 %   MONO% 9.9 0.0 - 14.0 %   EOS% 0.1 0.0 - 7.0 %   BASO% 0.4 0.0 - 2.0 %  Comprehensive metabolic panel  Result Value Ref Range   Sodium 138 136 - 145 mEq/L   Potassium 4.1 3.5 - 5.1 mEq/L   Chloride 104 98 - 109 mEq/L   CO2 27 22 - 29 mEq/L   Glucose 95 70 - 140 mg/dl   BUN 15.4 7.0 - 26.0 mg/dL   Creatinine 0.7 0.6 - 1.1 mg/dL   Total Bilirubin <0.30 0.20 - 1.20 mg/dL   Alkaline Phosphatase 94 40 - 150 U/L   AST 8 5 - 34 U/L   ALT <9 0 - 55 U/L   Total Protein 7.8 6.4 - 8.3 g/dL   Albumin 3.5 3.5 - 5.0 g/dL   Calcium 9.3 8.4 - 10.4  mg/dL   Anion Gap 8 3 - 11 mEq/L   EGFR >90 >90 ml/min/1.73 m2  CA 125  Result Value Ref Range   Cancer Antigen (CA) 125 58.2 (H) 0.0 - 38.1 U/mL     Studies/Results:  No results found.  Medications: I have reviewed the patient's current medications.  DISCUSSION  Previous progression was manifested with rapid development of malignant ascites, which seems to be starting now and marker available after visit also higher. I have told her that we have gotten maximum benefit from the gemzar and need to change therapy. As marker and physical findings all consistent, and no acute problems, I do not think repeat scan now is necessary. Situation is complicated by medicaid application pending, vs marketplace insurance if medicaid is denied.  Recommendation is for olaparib given germline BRCA mutation. I have asked [facility] pharmacist to do teaching and to see if she may be eligible for pharmaceutical assistance at least until Medicaid is hopefully approved. Dose will be 400 mg bid. I will need to see her back in ~ 7-10 days following start of olaparib. At conclusion of our discussion, she has given verbal consent for treatment   Assessment/Plan:  1. 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 Rapid recurrence/ progression of gyn cancer within 4 weeks of completing that chemo. Paracentesis confirmed serous malignancy.Clinically responded initially to gemzar but progression now by clinical findings and marker. Olaparib as soon as able to obtain that. 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 Bilateral mammograms at [facility] done 02-13-15, breast MRI done 05-25-15, no evidence of active malignancy then. 4.PAC in, will need to flush  every 6-8 weeks when not otherwise used.  5.chemo and iron deficiency anemia: hemoglobin a little better, continue oral iron. Not obviously more symptomatic, follow 6.nutritional status: early satiety in last week. 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 Previous epirubicin, some cardiac exposure from left breast radiation. BP better now 8.hypothyroidism on replacement 9.post parathyroidectomy 2005 10.insurance concerns: appreciate help from [facility] staff. Medicaid application pending. 11.Flu vaccine 04-02-15 12.taxol peripheral neuropathy somewhat improved, continues gabapentin prn at hs, has needed this mostly after neulasta 13.advance directives in place 27.chemo neutropenia with gemzar, required neulasta   All questions answered, patient understands recommendations and plans. She will call if needed prior to next visit, which will be coordinated  with timing of olaparib. Time spent 25 min including >50% counseling and coordination of care.    Gordy Levan, MD   10/09/2015, 9:23 PM

## 2015-10-09 ENCOUNTER — Encounter: Payer: Self-pay | Admitting: Oncology

## 2015-10-09 LAB — CA 125: Cancer Antigen (CA) 125: 58.2 U/mL — ABNORMAL HIGH (ref 0.0–38.1)

## 2015-10-09 NOTE — Progress Notes (Signed)
[facility] END OF TREATMENT   Name: DARWIN CALDRON Date: October 09, 2015  MRN: [id number] DOB: 1950/10/09   TREATMENT DATES: 04-09-2015 thru 09-24-2015   REFERRING PHYSICIAN: E.Rossi   DIAGNOSIS: right ovarian cancer   STAGE AT START OF TREATMENT: IIIC recurrent   INTENT: control   DRUGS OR REGIMENS GIVEN: gemcitabine   MAJOR TOXICITIES: neutropenia, gCSF aches   REASON TREATMENT STOPPED: progression   PERFORMANCE STATUS AT END: 0   ONGOING PROBLEMS: recurrent ascites   FOLLOW UP PLANS: change in systemic therapy

## 2015-10-12 ENCOUNTER — Encounter: Payer: Self-pay | Admitting: Pharmacist

## 2015-10-12 NOTE — Progress Notes (Signed)
10/12/15: Ms. Lutfi came by [facility] to sign paperwork for [facility] patient assistance through [place]. Patient has medicaid pending but she may not qualify for this based on the amount of social security she receives each month (~$1300). She has no other insurance.   Enrollment form signed by patient and Dr. Marko Plume and faxed to [phone]. [employer] assistance program phone #: [phone].   Will follow up on enrollment process and approval for assistance.   Thank you,   Montel Clock PharmD, [facility]

## 2015-10-19 ENCOUNTER — Telehealth: Payer: Self-pay | Admitting: Pharmacist

## 2015-10-19 NOTE — Telephone Encounter (Signed)
I called pt again before leaving for the day on 10/19/15 & was able to talk with her over phone. I gave her the ph# to [employer] and also the tracking # for her package.  She will try to call and arrange delivery. She will try to come to [facility] on Monday (4/3) at 4 pm to sign paperwork as mentioned previously.  She will need to speak w/ Montel Clock, Pharm.D to sign the form. Kennith Center, Pharm.D., CPP 10/19/2015@4 :24 PM

## 2015-10-19 NOTE — Telephone Encounter (Signed)
Pt called [facility] stating she had received calls from our number. On 3/28 and 3/30, we made attempts to contact pt to have her come in and sign and date [place] application for Free [employer] Medicines.  Two pharmacists left voicemails.  Pt stated she called [place] in the oral chemo clinic yesterday (3/30) and left a message but no one called her back.  I had no messages on this phone when I arrived this morning.  Barbaraann Share, RN w/ Dr. Marko Plume brought in a fax dated 10/15/15 stating Lonie [place] was shipped for the patient to address of [address], [place]. Ms. Stalling stated she has not received this package and she usually gets her packages at her [address]. ([address], [place]) I contacted the [place] program and they do not ship to PO Boxes.  The representative at [place] provided a [employer] tracking # [id number].  She stated [employer] attempted to deliver the medicine to pts home on Tues (3/28), Wed (3/29) and Thurs (3/30) and there was no adult recipient available to receive the package.  A signature is required. When I tracked the package, there appears to have been a door tag left [id number]) to notify of attempted delivery. The package is now at [facility] facility in [place] w/ no scheduled delivery date available.   I did not try to call [employer] ([phone]) because I thought it would be better for Ms. Jester to call them and arrange a time for delivery when she knows she can be home.  I tried to call Ms. Valverde back at home and no answer (less than 15 minutes from the time she called me).  I left a voicemail for her to return my call.  I called her alternate number and it was [employer].  I did not wait for anyone to answer, I hung up. She is TEMPORARILY enrolled in [place] and still needs to come to [facility] and sign and date [place] application for Free [employer] Medicines (page 5 of 5) and we need to submit her application along w/ proof of income (fax# [phone]). Kennith Center, Pharm.D., CPP 10/19/2015@2 :46 PM [facility]

## 2015-10-22 ENCOUNTER — Encounter: Payer: Self-pay | Admitting: Pharmacist

## 2015-10-22 NOTE — Progress Notes (Signed)
Pt came to [facility] lobby today and signed enrollment form for [place] (was temporarily enrolled in program; pending pt signature and proof of income).  Faxed to [employer] today. Pt stated she was leaving from here to go pick up her [place] at [facility] facility. Kennith Center, Pharm.D., CPP 10/22/2015@4 :42 PM [facility]

## 2015-10-28 ENCOUNTER — Other Ambulatory Visit: Payer: Self-pay | Admitting: Oncology

## 2015-10-29 ENCOUNTER — Ambulatory Visit (HOSPITAL_BASED_OUTPATIENT_CLINIC_OR_DEPARTMENT_OTHER): Payer: Self-pay

## 2015-10-29 ENCOUNTER — Ambulatory Visit (HOSPITAL_BASED_OUTPATIENT_CLINIC_OR_DEPARTMENT_OTHER): Payer: Self-pay | Admitting: Oncology

## 2015-10-29 ENCOUNTER — Telehealth: Payer: Self-pay | Admitting: Oncology

## 2015-10-29 ENCOUNTER — Encounter: Payer: Self-pay | Admitting: Oncology

## 2015-10-29 ENCOUNTER — Other Ambulatory Visit (HOSPITAL_BASED_OUTPATIENT_CLINIC_OR_DEPARTMENT_OTHER): Payer: Self-pay

## 2015-10-29 VITALS — BP 128/91 | HR 79 | Temp 98.3°F | Resp 18 | Ht 62.0 in | Wt 133.4 lb

## 2015-10-29 DIAGNOSIS — Z95828 Presence of other vascular implants and grafts: Secondary | ICD-10-CM

## 2015-10-29 DIAGNOSIS — Z1509 Genetic susceptibility to other malignant neoplasm: Principal | ICD-10-CM

## 2015-10-29 DIAGNOSIS — Z853 Personal history of malignant neoplasm of breast: Secondary | ICD-10-CM

## 2015-10-29 DIAGNOSIS — C561 Malignant neoplasm of right ovary: Secondary | ICD-10-CM

## 2015-10-29 DIAGNOSIS — D500 Iron deficiency anemia secondary to blood loss (chronic): Secondary | ICD-10-CM

## 2015-10-29 DIAGNOSIS — Z1501 Genetic susceptibility to malignant neoplasm of breast: Principal | ICD-10-CM

## 2015-10-29 DIAGNOSIS — Z79899 Other long term (current) drug therapy: Secondary | ICD-10-CM

## 2015-10-29 DIAGNOSIS — E039 Hypothyroidism, unspecified: Secondary | ICD-10-CM

## 2015-10-29 DIAGNOSIS — K521 Toxic gastroenteritis and colitis: Secondary | ICD-10-CM

## 2015-10-29 LAB — COMPREHENSIVE METABOLIC PANEL
ALBUMIN: 3.6 g/dL (ref 3.5–5.0)
ALK PHOS: 77 U/L (ref 40–150)
ALT: 10 U/L (ref 0–55)
AST: 13 U/L (ref 5–34)
Anion Gap: 7 mEq/L (ref 3–11)
BUN: 11.1 mg/dL (ref 7.0–26.0)
CALCIUM: 9.1 mg/dL (ref 8.4–10.4)
CO2: 27 mEq/L (ref 22–29)
Chloride: 103 mEq/L (ref 98–109)
Creatinine: 0.8 mg/dL (ref 0.6–1.1)
EGFR: 90 mL/min/{1.73_m2} — AB (ref >90)
GLUCOSE: 93 mg/dL (ref 70–140)
POTASSIUM: 3.8 meq/L (ref 3.5–5.1)
SODIUM: 137 meq/L (ref 136–145)
Total Bilirubin: 0.58 mg/dL (ref 0.20–1.20)
Total Protein: 7.2 g/dL (ref 6.4–8.3)

## 2015-10-29 LAB — CBC WITH DIFFERENTIAL/PLATELET
BASO%: 1 % (ref 0.0–2.0)
BASOS ABS: 0 10*3/uL (ref 0.0–0.1)
EOS ABS: 0 10*3/uL (ref 0.0–0.5)
EOS%: 0.3 % (ref 0.0–7.0)
HCT: 34.5 % — ABNORMAL LOW (ref 34.8–46.6)
HEMOGLOBIN: 11.1 g/dL — AB (ref 11.6–15.9)
LYMPH%: 31.3 % (ref 14.0–49.7)
MCH: 30.5 pg (ref 25.1–34.0)
MCHC: 32.2 g/dL (ref 31.5–36.0)
MCV: 94.8 fL (ref 79.5–101.0)
MONO#: 0.7 10*3/uL (ref 0.1–0.9)
MONO%: 18.9 % — ABNORMAL HIGH (ref 0.0–14.0)
NEUT#: 1.8 10*3/uL (ref 1.5–6.5)
NEUT%: 48.5 % (ref 38.4–76.8)
Platelets: 302 10*3/uL (ref 145–400)
RBC: 3.64 10*6/uL — ABNORMAL LOW (ref 3.70–5.45)
RDW: 14.9 % — AB (ref 11.2–14.5)
WBC: 3.7 10*3/uL — ABNORMAL LOW (ref 3.9–10.3)
lymph#: 1.1 10*3/uL (ref 0.9–3.3)

## 2015-10-29 MED ORDER — HEPARIN SOD (PORK) LOCK FLUSH 100 UNIT/ML IV SOLN
500.0000 [IU] | Freq: Once | INTRAVENOUS | Status: AC
  Administered 2015-10-29: 500 [IU] via INTRAVENOUS
  Filled 2015-10-29: qty 5

## 2015-10-29 MED ORDER — SODIUM CHLORIDE 0.9% FLUSH
10.0000 mL | INTRAVENOUS | Status: DC | PRN
  Administered 2015-10-29: 10 mL via INTRAVENOUS
  Filled 2015-10-29: qty 10

## 2015-10-29 NOTE — Patient Instructions (Signed)

## 2015-10-29 NOTE — Telephone Encounter (Signed)
appt made and avs printed °

## 2015-10-29 NOTE — Progress Notes (Signed)
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 Last prior chemo was gemcitabine used 04-09-15 thru 09-24-15. Patient received initial supply of Lynparza from [employer] assistance program.   Patient was having some abdominal swelling but regular bowel movements thru 10-26-15, then no bowel movement with increased abdominal distension on 10-27-15 without vomiting or pain, then 4 liquid stools on 10-28-15 thru 2030. She has had no bowel movement since then, and abdomen feels much better. She had no upper GI symptoms, no fever and no severe abdominal pain with the diarrhea. She held [place] on 4-9 and has not taken that today. She drank fluids but did not eat on 4-9, and has been very cautious with po intake today.  She denies new or different pain, SOB or other respiratory symptoms, LE swelling, any bleeding. She has had no changes in breasts, no problems with PAC, no rash. Energy has been good other than the day of diarrhea when she generally felt badly. No fever, no symptoms of infection. Remainder of 10 point Review of Systems negative.     PAC flushed 10-29-15 BRCA 2 mutation ([id number]*) by [employer] 10-12-14 Flu vaccine 04-02-15 CA 125 on 04-09-15 baseline for gemzar 313, down to 27 in Jan 2017, then 58 on 10-08-15  [facility] pharmacist following, including assisting with the [employer] assistance. [place] financial staff involved, I believe still waiting on possible Medicaid.  ONCOLOGIC HISTORY Left ovarian cancer: Patient became symptomatic with abdominal swelling and constipation in late Nov 2015, seen and treated initially for what was thought to be ulcer. Symptoms continued, with early satiety causing poor po intake; she was seen again at [facility] ED in early Jan  2016, with CT reportedly consistent with stage IIIC or IV gyn cancer, including omental caking, large pelvic mass, para-aortic adenopathy, ascites, pleural effusions and right subpleural nodule. She was seen in consultation by Dr Denman George on 07-25-14, with concern from her exam and scan for rectal involvement. 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 She had paracentesis of 3.4 liters ascites on 07-27-2014, with cytology ([id number]) showing serous carcinoma consistent with gyn primary, ER negative. She had right thoracentesis for 350 cc on 08-11-14, this requested by anesthesia prior to surgery and apparently not sent for cytology. Patient was more comfortable after paracentesis, but could not tell any difference in breathing after thoracentesis (denies SOB prior to that procedure). CA 125 on 08-02-14 was 1162. She was taken to exploratory laparotomy by Dr Denman George on 08-15-14, with 4 liters of ascites removed and biopsy of omentum obtained, with additional debulking not attempted due to surgical findings of entire peritoneal surface replaced by tumor plaque, small intestine coated with tumor and mesentery tethered with tumor nodules, ileum adherent to pelvic mass, dense plaque of tumor on right diaphragm and 10 cm omental cake in LUQ. Pathology of omental biopsy She needed another paracentesis by IR for 1.3 liters ascites on 08-17-14, and had PAC placed by IR also 08-17-14. Day 1 cycle 1 dose dense carboplatin taxol was given in hospital on 08-18-14. After 2 cycles of dose dense regimen, the carboplatin and taxol was changed to every 3 week regimen beginning cycle 3; she had cycle 5 on 11-09-14, with gCSF support. She had interval debulking by  Dr Denman George on 11-28-14. Surgery was exploratory laparotomy with TAH/ BSO, omentectomy and radical tumor debulking; R1 resection with residual milial tumor on small bowel at completion of procedure. Tumor involved bilateral  adnexa, uterine serosa, omentum and falciform ligament, with + LVSI, no nodes examined. Maximum tumor size was 4.3 cm involving left ovary. She resumed carboplatin Taxol on 12-21-14, total of 3 cycles thru 02-01-15 (= 3 cycles after interval debulking). Restaging CT CAP 03-12-15 had some changes concerning for residual or recurrent disease, and CA 125 was elevated to 64 on 03-14-15, having been 6 in July 2016.  Review of prior radiation and chemotherapy (for left breast cancer) significant for use of epirubicin and some unquantified cardiac exposure to radiation. She began gemcitabine 04-09-15. She was neutropenic by 04-16-15. Regimen was changed to every other week gemzar with neulasta support, gemzar dose also decreased beginning cycle 2. CA 125 on 04-09-15 baseline for gemzar 313, down to 27 in Jan 2017, then 35 in Feb and 39 early Jan 2017. She continued gemzar thru 09-24-15. Olaparib Lonie Peak) begun 10-25-15.  BRCA 2 abnormality identified 10-12-14.  Breast cancer: Left breast biopsy 11-22-2003 ([id number]) invasive carcinoma. Left breast wide excision with sentinel nodes and axillary contents by Dr Benson Norway 01-03-2004 [phone]) poorly differentiated invasive ductal carcinoma 3.0 cm with closest margin 1.7 cm deep, 6 sentinel nodes + 3 additional nodes all negative, ER 0, PR 0, HER 2 3+, Ki67 of 19%. . She received adjuvant chemotherapy by Dr Sonny Dandy with 5FU, epirubicin, cytoxan x 4 cycles from 02-13-2004 thru 04-16-2004, followed by taxol 80 mg/m2 weekly x 11 from 05-07-2004 thru 09-04-2004, and herceptin 05-21-2004 thru 02-25-2005. Antiemetics were Kytril and decadron. She had PAC for the chemotherapy. She had radiation by Dr Tyler Pita in [place]. Last note from Dr Sonny Dandy in this information was 04-2008. Left diagnostic mammogram [facility] 10-25-2014 stable compared with 12-06-13 and priors; bilateral at [facility] 02-13-15. Breast MRI 05-25-15 had no evidence of malignancy there, this medically necessary with BRCA 2  mutation.  Objective:  Vital signs in last 24 hours:  BP 128/91 mmHg  Pulse 79  Temp(Src) 98.3 F (36.8 C) (Oral)  Resp 18  Ht 5' 2"  (1.575 m)  Wt 133 lb 6.4 oz (60.51 kg)  BMI 24.39 kg/m2  SpO2 98% Weight stable. Looks comfortable, respirations not labored.  Alert, oriented and appropriate. Ambulatory without difficulty, easily and quickly able to get on and off exam table.   HEENT:PERRL, sclerae not icteric. Oral mucosa moist without lesions, posterior pharynx clear.  Neck supple. No JVD.  Lymphatics:no cervical,supraclavicular, axillary or inguinal adenopathy Resp: clear to auscultation bilaterally and normal percussion bilaterally Cardio: regular rate and rhythm. No gallop. GI: soft, nontender, not distended, no mass or organomegaly. Normally active bowel sounds. Surgical incision not remarkable. Musculoskeletal/ Extremities: without pitting edema, cords, tenderness Neuro: no change peripheral neuropathy. Otherwise nonfocal. PSYCH appropriate mood and affect Skin without rash, ecchymosis, petechiae Breasts: right without dominant mass, skin or nipple findings. Left with well healted surgical scar, thickening superiorly unchanged, no new dominant mass or skin findings.  Axillae benign. Portacath-without erythema or tenderness, flushed with no difficulty today  Lab Results:  Results for orders placed or performed in visit on 10/29/15  CBC with Differential  Result Value Ref Range   WBC 3.7 (L) 3.9 - 10.3 10e3/uL   NEUT# 1.8 1.5 - 6.5 10e3/uL   HGB 11.1 (L) 11.6 - 15.9 g/dL   HCT 34.5 (L) 34.8 - 46.6 %   Platelets 302  145 - 400 10e3/uL   MCV 94.8 79.5 - 101.0 fL   MCH 30.5 25.1 - 34.0 pg   MCHC 32.2 31.5 - 36.0 g/dL   RBC 3.64 (L) 3.70 - 5.45 10e6/uL   RDW 14.9 (H) 11.2 - 14.5 %   lymph# 1.1 0.9 - 3.3 10e3/uL   MONO# 0.7 0.1 - 0.9 10e3/uL   Eosinophils Absolute 0.0 0.0 - 0.5 10e3/uL   Basophils Absolute 0.0 0.0 - 0.1 10e3/uL   NEUT% 48.5 38.4 - 76.8 %   LYMPH% 31.3  14.0 - 49.7 %   MONO% 18.9 (H) 0.0 - 14.0 %   EOS% 0.3 0.0 - 7.0 %   BASO% 1.0 0.0 - 2.0 %  Comprehensive metabolic panel  Result Value Ref Range   Sodium 137 136 - 145 mEq/L   Potassium 3.8 3.5 - 5.1 mEq/L   Chloride 103 98 - 109 mEq/L   CO2 27 22 - 29 mEq/L   Glucose 93 70 - 140 mg/dl   BUN 11.1 7.0 - 26.0 mg/dL   Creatinine 0.8 0.6 - 1.1 mg/dL   Total Bilirubin 0.58 0.20 - 1.20 mg/dL   Alkaline Phosphatase 77 40 - 150 U/L   AST 13 5 - 34 U/L   ALT 10 0 - 55 U/L   Total Protein 7.2 6.4 - 8.3 g/dL   Albumin 3.6 3.5 - 5.0 g/dL   Calcium 9.1 8.4 - 10.4 mg/dL   Anion Gap 7 3 - 11 mEq/L   EGFR 90 (L) >90 ml/min/1.73 m2  CA 125  Result Value Ref Range   Cancer Antigen (CA) 125 167.4 (H) 0.0 - 38.1 U/mL   CA 125 was 58 on 10-08-15 and 39 on 09-19-15  WBC/ ANC down from 6.3/4.1 on 10-08-15, tho Hgb better and platelets stable.   Studies/Results:  No results found.  Medications: I have reviewed the patient's current medications. She will resume Lynparza at 6 tablets = 300 mg bid in next 24 hours if no further GI problems.  Carafate ok to continue, as she felt this helped GI symptoms.  DISCUSSION Interval history discussed. Diarrhea likely related to the Lynparza begun at 400 mg bid, but already much better on brief treatment break. Reminded her to push fluids with any diarrhea. Will resume at 300 mg bid as long as stable for next ~ 24 hrs. She will be seen in 1 week and in 2 weeks with counts. Will adjust dose back up if tolerates later.    Assessment/Plan:  1. 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 Rapid recurrence/ progression of gyn cancer within 4 weeks of completing that chemo. Paracentesis confirmed serous malignancy. Responded initially to gemzar but progression by early March 2017.  Olaparib begun 10-25-15 at 400 mg bid, diarrhea day 4. Will resume olaparib at 300 mg bid if ok over next ~ 24 hours. She will see provider with counts 4-17 and 4-24.  Appreciate assistance from [facility] pharmacist. 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 BRCA 2+. Bilateral mammograms at [facility] done 02-13-15, breast MRI done 05-25-15, no evidence of active malignancy then. 4.PAC in, will need to flush every 6-8 weeks when not otherwise used.  5.chemo and iron deficiency anemia: hemoglobin improved, continue oral iron.  6.nutritional status: some early satiety 7.RBBB and LAFB, HTN, known to Dr Acie Fredrickson. Previous epirubicin, some cardiac exposure from left breast radiation. BP better now 8.hypothyroidism on replacement 9.post parathyroidectomy 2005 10.insurance concerns: appreciate help from [facility] staff. Medicaid application pending. 11.Flu vaccine 04-02-15 12.taxol peripheral neuropathy somewhat improved, continues gabapentin prn at hs, has needed this mostly after neulasta 13.advance directives in place 17.chemo neutropenia with gemzar, required neulasta   All questions answered. Time spent 30 min including >50% counseling and coordination of care. Route to PCP  Gordy Levan, MD   10/31/2015, 8:54 AM

## 2015-10-30 LAB — CA 125: Cancer Antigen (CA) 125: 167.4 U/mL — ABNORMAL HIGH (ref 0.0–38.1)

## 2015-10-31 ENCOUNTER — Other Ambulatory Visit: Payer: Self-pay | Admitting: Oncology

## 2015-10-31 DIAGNOSIS — Z1509 Genetic susceptibility to other malignant neoplasm: Principal | ICD-10-CM

## 2015-10-31 DIAGNOSIS — D500 Iron deficiency anemia secondary to blood loss (chronic): Secondary | ICD-10-CM | POA: Insufficient documentation

## 2015-10-31 DIAGNOSIS — Z79899 Other long term (current) drug therapy: Secondary | ICD-10-CM | POA: Insufficient documentation

## 2015-10-31 DIAGNOSIS — C562 Malignant neoplasm of left ovary: Secondary | ICD-10-CM

## 2015-10-31 DIAGNOSIS — K521 Toxic gastroenteritis and colitis: Secondary | ICD-10-CM | POA: Insufficient documentation

## 2015-10-31 DIAGNOSIS — Z1501 Genetic susceptibility to malignant neoplasm of breast: Principal | ICD-10-CM

## 2015-11-01 ENCOUNTER — Ambulatory Visit (HOSPITAL_BASED_OUTPATIENT_CLINIC_OR_DEPARTMENT_OTHER): Payer: Self-pay

## 2015-11-01 ENCOUNTER — Other Ambulatory Visit (HOSPITAL_BASED_OUTPATIENT_CLINIC_OR_DEPARTMENT_OTHER): Payer: Self-pay

## 2015-11-01 DIAGNOSIS — Z1509 Genetic susceptibility to other malignant neoplasm: Principal | ICD-10-CM

## 2015-11-01 DIAGNOSIS — C562 Malignant neoplasm of left ovary: Secondary | ICD-10-CM

## 2015-11-01 DIAGNOSIS — Z95828 Presence of other vascular implants and grafts: Secondary | ICD-10-CM

## 2015-11-01 DIAGNOSIS — Z1501 Genetic susceptibility to malignant neoplasm of breast: Principal | ICD-10-CM

## 2015-11-01 LAB — COMPREHENSIVE METABOLIC PANEL
ALBUMIN: 3.7 g/dL (ref 3.5–5.0)
ALT: <9 U/L (ref 0–55)
ANION GAP: 7 meq/L (ref 3–11)
AST: 15 U/L (ref 5–34)
Alkaline Phosphatase: 74 U/L (ref 40–150)
BUN: 10.8 mg/dL (ref 7.0–26.0)
CALCIUM: 9.2 mg/dL (ref 8.4–10.4)
CHLORIDE: 102 meq/L (ref 98–109)
CO2: 26 meq/L (ref 22–29)
CREATININE: 0.8 mg/dL (ref 0.6–1.1)
EGFR: >90 mL/min/{1.73_m2} (ref >90)
GLUCOSE: 90 mg/dL (ref 70–140)
Potassium: 4 mEq/L (ref 3.5–5.1)
Sodium: 135 mEq/L — ABNORMAL LOW (ref 136–145)
TOTAL PROTEIN: 7.4 g/dL (ref 6.4–8.3)
Total Bilirubin: 0.41 mg/dL (ref 0.20–1.20)

## 2015-11-01 LAB — CBC WITH DIFFERENTIAL/PLATELET
BASO%: 0.8 % (ref 0.0–2.0)
BASOS ABS: 0 10*3/uL (ref 0.0–0.1)
EOS ABS: 0 10*3/uL (ref 0.0–0.5)
EOS%: 0.3 % (ref 0.0–7.0)
HEMATOCRIT: 34.6 % — AB (ref 34.8–46.6)
HGB: 11.1 g/dL — ABNORMAL LOW (ref 11.6–15.9)
LYMPH#: 1.3 10*3/uL (ref 0.9–3.3)
LYMPH%: 41.5 % (ref 14.0–49.7)
MCH: 30.3 pg (ref 25.1–34.0)
MCHC: 32.1 g/dL (ref 31.5–36.0)
MCV: 94.4 fL (ref 79.5–101.0)
MONO#: 0.5 10*3/uL (ref 0.1–0.9)
MONO%: 15.3 % — AB (ref 0.0–14.0)
NEUT#: 1.3 10*3/uL — ABNORMAL LOW (ref 1.5–6.5)
NEUT%: 42.1 % (ref 38.4–76.8)
Platelets: 278 10*3/uL (ref 145–400)
RBC: 3.66 10*6/uL — ABNORMAL LOW (ref 3.70–5.45)
RDW: 15.1 % — ABNORMAL HIGH (ref 11.2–14.5)
WBC: 3.1 10*3/uL — ABNORMAL LOW (ref 3.9–10.3)

## 2015-11-01 MED ORDER — SODIUM CHLORIDE 0.9% FLUSH
10.0000 mL | INTRAVENOUS | Status: DC | PRN
  Administered 2015-11-01: 10 mL via INTRAVENOUS
  Filled 2015-11-01: qty 10

## 2015-11-01 MED ORDER — HEPARIN SOD (PORK) LOCK FLUSH 100 UNIT/ML IV SOLN
500.0000 [IU] | Freq: Once | INTRAVENOUS | Status: AC
  Administered 2015-11-01: 500 [IU] via INTRAVENOUS
  Filled 2015-11-01: qty 5

## 2015-11-01 NOTE — Patient Instructions (Signed)

## 2015-11-02 ENCOUNTER — Telehealth: Payer: Self-pay

## 2015-11-02 NOTE — Telephone Encounter (Signed)
-----   Message from Gordy Levan, MD sent at 10/31/2015  1:39 PM EDT ----- RN please check on her by phone on 4-14.  Did diarrhea resolve? Did she resume [place] Remind her dose decreased 6 tablets = 300 mg bid for now  thanks

## 2015-11-02 NOTE — Telephone Encounter (Signed)
Ms. Livezey states that she has not hd diarrhea since Monday 10-29-15 am.  She has not moved her bowels at all since 10-29-15 am. She took 6 tablets =300 mg of the Lynparza Monday night 10-29-15.  She had nausea and vomited several times.  Antiemetics not effective.  She has not taken any more [place]  She feels that the dose is still to strong for her. She experienced abdominal pain 7-8/10 after talking the [place]  She is using tylenol for pain.  She also states that after eating or drinking that she hears a lot of rumbling in her abdomen. It sounds like a volcano. She also feels bloated after eating or drinking even small amounts. Her abdomen feels tight. Told her that Dr. Marko Plume wants her to get Miralax and take 1 capful biduntil she has a bowel movement.  She needs to take liquids only.  Drink liquid supplements such as Ensure or Boost. She may need a paracentesis after she is evaluated by Mikey Bussing, NP Monday 11-05-14. If she feels better, Dr. Marko Plume said that she could restart the Lynparza 6 tablets =300 mg bid. If she has nausea or vomiting that is not controlled by antiemetics or develops severe abdominal pain, she needs to go to the ED.  Ms Mtthews verbalized understanding.

## 2015-11-05 ENCOUNTER — Encounter: Payer: Self-pay | Admitting: Oncology

## 2015-11-05 ENCOUNTER — Ambulatory Visit (HOSPITAL_BASED_OUTPATIENT_CLINIC_OR_DEPARTMENT_OTHER): Payer: Self-pay | Admitting: Oncology

## 2015-11-05 ENCOUNTER — Other Ambulatory Visit (HOSPITAL_BASED_OUTPATIENT_CLINIC_OR_DEPARTMENT_OTHER): Payer: Self-pay

## 2015-11-05 VITALS — BP 125/86 | HR 82 | Temp 98.3°F | Resp 18 | Ht 62.0 in | Wt 133.0 lb

## 2015-11-05 DIAGNOSIS — Z1501 Genetic susceptibility to malignant neoplasm of breast: Principal | ICD-10-CM

## 2015-11-05 DIAGNOSIS — C562 Malignant neoplasm of left ovary: Secondary | ICD-10-CM

## 2015-11-05 DIAGNOSIS — R180 Malignant ascites: Secondary | ICD-10-CM

## 2015-11-05 DIAGNOSIS — Z1509 Genetic susceptibility to other malignant neoplasm: Principal | ICD-10-CM

## 2015-11-05 LAB — COMPREHENSIVE METABOLIC PANEL
ALT: 18 U/L (ref 0–55)
AST: 20 U/L (ref 5–34)
Albumin: 3.8 g/dL (ref 3.5–5.0)
Alkaline Phosphatase: 70 U/L (ref 40–150)
Anion Gap: 7 mEq/L (ref 3–11)
BILIRUBIN TOTAL: 0.43 mg/dL (ref 0.20–1.20)
BUN: 16.1 mg/dL (ref 7.0–26.0)
CO2: 28 meq/L (ref 22–29)
Calcium: 9.2 mg/dL (ref 8.4–10.4)
Chloride: 104 mEq/L (ref 98–109)
Creatinine: 0.9 mg/dL (ref 0.6–1.1)
EGFR: 78 mL/min/{1.73_m2} — AB (ref >90)
GLUCOSE: 103 mg/dL (ref 70–140)
Potassium: 4 mEq/L (ref 3.5–5.1)
SODIUM: 139 meq/L (ref 136–145)
TOTAL PROTEIN: 7.3 g/dL (ref 6.4–8.3)

## 2015-11-05 LAB — CBC WITH DIFFERENTIAL/PLATELET
BASO%: 1 % (ref 0.0–2.0)
Basophils Absolute: 0 10*3/uL (ref 0.0–0.1)
EOS%: 0.5 % (ref 0.0–7.0)
Eosinophils Absolute: 0 10*3/uL (ref 0.0–0.5)
HCT: 34.9 % (ref 34.8–46.6)
HGB: 11.2 g/dL — ABNORMAL LOW (ref 11.6–15.9)
LYMPH%: 42.9 % (ref 14.0–49.7)
MCH: 30.4 pg (ref 25.1–34.0)
MCHC: 32.1 g/dL (ref 31.5–36.0)
MCV: 94.7 fL (ref 79.5–101.0)
MONO#: 0.4 10*3/uL (ref 0.1–0.9)
MONO%: 14.1 % — AB (ref 0.0–14.0)
NEUT%: 41.5 % (ref 38.4–76.8)
NEUTROS ABS: 1.1 10*3/uL — AB (ref 1.5–6.5)
Platelets: 264 10*3/uL (ref 145–400)
RBC: 3.68 10*6/uL — AB (ref 3.70–5.45)
RDW: 14.9 % — ABNORMAL HIGH (ref 11.2–14.5)
WBC: 2.7 10*3/uL — AB (ref 3.9–10.3)
lymph#: 1.2 10*3/uL (ref 0.9–3.3)

## 2015-11-05 NOTE — Progress Notes (Signed)
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 Last prior chemo was gemcitabine used 04-09-15 thru 09-24-15. Patient received initial supply of Lynparza from [employer] assistance program.   Patient  Has been having more abdominal discomfort with nausea and occasional vomiting. Reports that abdomen feels full and similar to when she required prior paracentesis. Having regular bowel movements with use of stool softeners and MiraLax. She has been trying to take Lynparza 300 mg BID, but due to nausea, she is having difficulty. Has antiemetics at home which help some. She denies new or different pain, SOB or other respiratory symptoms, LE swelling, any bleeding. She has had no changes in breasts, no problems with PAC, no rash. Energy has been good. No fever, no symptoms of infection. Remainder of 10 point Review of Systems negative.     PAC flushed 10-29-15 BRCA 2 mutation ([id number]*) by [employer] 10-12-14 Flu vaccine 04-02-15 CA 125 on 04-09-15 baseline for gemzar 313, down to 27 in Jan 2017, then 58 on 10-08-15  [facility] pharmacist following, including assisting with the [employer] assistance. [place] financial staff involved, I believe still waiting on possible Medicaid.  ONCOLOGIC HISTORY Left ovarian cancer: Patient became symptomatic with abdominal swelling and constipation in late Nov 2015, seen and treated initially for what was thought to be ulcer. Symptoms continued, with early satiety causing poor po intake; she was seen again at [facility] ED in early Jan 2016, with CT reportedly consistent with stage IIIC or IV gyn cancer, including omental caking, large pelvic mass, para-aortic adenopathy, ascites, pleural effusions and right subpleural nodule. She was seen  in consultation by Dr Denman George on 07-25-14, with concern from her exam and scan for rectal involvement. 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 She had paracentesis of 3.4 liters ascites on 07-27-2014, with cytology ([id number]) showing serous carcinoma consistent with gyn primary, ER negative. She had right thoracentesis for 350 cc on 08-11-14, this requested by anesthesia prior to surgery and apparently not sent for cytology. Patient was more comfortable after paracentesis, but could not tell any difference in breathing after thoracentesis (denies SOB prior to that procedure). CA 125 on 08-02-14 was 1162. She was taken to exploratory laparotomy by Dr Denman George on 08-15-14, with 4 liters of ascites removed and biopsy of omentum obtained, with additional debulking not attempted due to surgical findings of entire peritoneal surface replaced by tumor plaque, small intestine coated with tumor and mesentery tethered with tumor nodules, ileum adherent to pelvic mass, dense plaque of tumor on right diaphragm and 10 cm omental cake in LUQ. Pathology of omental biopsy She needed another paracentesis by IR for 1.3 liters ascites on 08-17-14, and had PAC placed by IR also 08-17-14. Day 1 cycle 1 dose dense carboplatin taxol was given in hospital on 08-18-14. After 2 cycles of dose dense regimen, the carboplatin and taxol was changed to every 3 week regimen beginning cycle 3; she had cycle 5 on 11-09-14, with gCSF support. She had interval debulking by Dr Denman George on 11-28-14. Surgery was exploratory laparotomy with TAH/ BSO, omentectomy and radical tumor debulking; R1 resection with residual milial tumor on small bowel at completion of procedure. Tumor involved bilateral adnexa, uterine serosa, omentum and falciform ligament, with + LVSI,  no nodes examined. Maximum tumor size was 4.3 cm involving left ovary. She resumed carboplatin Taxol on 12-21-14, total of 3 cycles thru  02-01-15 (= 3 cycles after interval debulking). Restaging CT CAP 03-12-15 had some changes concerning for residual or recurrent disease, and CA 125 was elevated to 64 on 03-14-15, having been 6 in July 2016.  Review of prior radiation and chemotherapy (for left breast cancer) significant for use of epirubicin and some unquantified cardiac exposure to radiation. She began gemcitabine 04-09-15. She was neutropenic by 04-16-15. Regimen was changed to every other week gemzar with neulasta support, gemzar dose also decreased beginning cycle 2. CA 125 on 04-09-15 baseline for gemzar 313, down to 27 in Jan 2017, then 35 in Feb and 39 early Jan 2017. She continued gemzar thru 09-24-15. Olaparib Lonie Peak) begun 10-25-15.  BRCA 2 abnormality identified 10-12-14.  Breast cancer: Left breast biopsy 11-22-2003 ([id number]) invasive carcinoma. Left breast wide excision with sentinel nodes and axillary contents by Dr Benson Norway 01-03-2004 [phone]) poorly differentiated invasive ductal carcinoma 3.0 cm with closest margin 1.7 cm deep, 6 sentinel nodes + 3 additional nodes all negative, ER 0, PR 0, HER 2 3+, Ki67 of 19%. . She received adjuvant chemotherapy by Dr Sonny Dandy with 5FU, epirubicin, cytoxan x 4 cycles from 02-13-2004 thru 04-16-2004, followed by taxol 80 mg/m2 weekly x 11 from 05-07-2004 thru 09-04-2004, and herceptin 05-21-2004 thru 02-25-2005. Antiemetics were Kytril and decadron. She had PAC for the chemotherapy. She had radiation by Dr Tyler Pita in [place]. Last note from Dr Sonny Dandy in this information was 04-2008. Left diagnostic mammogram [facility] 10-25-2014 stable compared with 12-06-13 and priors; bilateral at [facility] 02-13-15. Breast MRI 05-25-15 had no evidence of malignancy there, this medically necessary with BRCA 2 mutation.  Objective:  Vital signs in last 24 hours:  BP 125/86 mmHg  Pulse 82  Temp(Src) 98.3 F (36.8 C) (Oral)  Resp 18  Ht 5' 2"  (1.575 m)  Wt 133 lb (60.328 kg)  BMI 24.32 kg/m2  SpO2  100% Weight stable. Looks comfortable, respirations not labored.  Alert, oriented and appropriate. Ambulatory without difficulty, easily and quickly able to get on and off exam table.   HEENT:PERRL, sclerae not icteric. Oral mucosa moist without lesions, posterior pharynx clear.  Neck supple. No JVD.  Lymphatics:no cervical,supraclavicular, axillary or inguinal adenopathy Resp: clear to auscultation bilaterally and normal percussion bilaterally Cardio: regular rate and rhythm. No gallop. GI: tight, nontender, distended, no mass or organomegaly. Normally active bowel sounds. Surgical incision not remarkable. Musculoskeletal/ Extremities: without pitting edema, cords, tenderness Neuro: no change peripheral neuropathy. Otherwise nonfocal. PSYCH appropriate mood and affect Skin without rash, ecchymosis, petechiae Breasts: Deferred. Portacath-without erythema or tenderness, flushed with no difficulty today  Lab Results:  Results for orders placed or performed in visit on 11/05/15  CBC with Differential  Result Value Ref Range   WBC 2.7 (L) 3.9 - 10.3 10e3/uL   NEUT# 1.1 (L) 1.5 - 6.5 10e3/uL   HGB 11.2 (L) 11.6 - 15.9 g/dL   HCT 34.9 34.8 - 46.6 %   Platelets 264 145 - 400 10e3/uL   MCV 94.7 79.5 - 101.0 fL   MCH 30.4 25.1 - 34.0 pg   MCHC 32.1 31.5 - 36.0 g/dL   RBC 3.68 (L) 3.70 - 5.45 10e6/uL   RDW 14.9 (H) 11.2 - 14.5 %   lymph# 1.2 0.9 - 3.3 10e3/uL   MONO# 0.4 0.1 - 0.9 10e3/uL   Eosinophils Absolute 0.0 0.0 - 0.5 10e3/uL  Basophils Absolute 0.0 0.0 - 0.1 10e3/uL   NEUT% 41.5 38.4 - 76.8 %   LYMPH% 42.9 14.0 - 49.7 %   MONO% 14.1 (H) 0.0 - 14.0 %   EOS% 0.5 0.0 - 7.0 %   BASO% 1.0 0.0 - 2.0 %   CA 125 was 58 on 10-08-15 and 39 on 09-19-15. Up to 167.4 on 10/29/2015.  WBC/ ANC down to 1.1.   Studies/Results:  No results found.  Medications: I have reviewed the patient's current medications. She will hold Lynparza due to GI symptoms and neutropenia.   DISCUSSION Patient  with increased abdominal discomfort. Likely due to malignant ascites. Will set her up for an U/S guided paracentesis with the next 1-2 days. Hold Lynparza for now. Plan reviewed with Dr. Marko Plume.    Assessment/Plan:  1. 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 Rapid recurrence/ progression of gyn cancer within 4 weeks of completing that chemo. Paracentesis confirmed serous malignancy. Responded initially to gemzar but progression by early March 2017. Olaparib begun 10-25-15 at 400 mg bid, diarrhea day 4. Resumed olaparib at 300 mg bid, but more GI symptoms. Hold Olaparib for now.   Appreciate assistance from [facility] pharmacist. Paracentesis in the next 1-2 days. 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 BRCA 2+. Bilateral mammograms at [facility] done 02-13-15, breast MRI done 05-25-15, no evidence of active malignancy then. 4.PAC in, will need to flush every 6-8 weeks when not otherwise used.  5.chemo and iron deficiency anemia: hemoglobin improved, continue oral iron.  6.nutritional status: some early satiety 7.RBBB and LAFB, HTN, known to Dr Acie Fredrickson. Previous epirubicin, some cardiac exposure from left breast radiation. BP better now 8.hypothyroidism on replacement 9.post parathyroidectomy 2005 10.insurance concerns: appreciate help from [facility] staff. Medicaid application pending. 11.Flu vaccine 04-02-15 12.taxol peripheral neuropathy somewhat improved, continues gabapentin prn at hs, has needed this mostly after neulasta 13.advance directives in place 47.chemo neutropenia with gemzar, required neulasta   All questions answered. Time spent 20 min including >50% counseling and coordination of care. Route to  PCP  Mikey Bussing, NP   11/05/2015, 11:16 AM

## 2015-11-08 ENCOUNTER — Ambulatory Visit (HOSPITAL_COMMUNITY)
Admission: RE | Admit: 2015-11-08 | Discharge: 2015-11-08 | Disposition: A | Discharge status: Home / Self Care | Payer: Self-pay | Source: Ambulatory Visit | Attending: Oncology | Admitting: Oncology

## 2015-11-08 ENCOUNTER — Telehealth: Payer: Self-pay

## 2015-11-08 DIAGNOSIS — Z1509 Genetic susceptibility to other malignant neoplasm: Secondary | ICD-10-CM

## 2015-11-08 DIAGNOSIS — Z1501 Genetic susceptibility to malignant neoplasm of breast: Secondary | ICD-10-CM | POA: Insufficient documentation

## 2015-11-08 DIAGNOSIS — R180 Malignant ascites: Secondary | ICD-10-CM | POA: Insufficient documentation

## 2015-11-08 DIAGNOSIS — C562 Malignant neoplasm of left ovary: Secondary | ICD-10-CM | POA: Insufficient documentation

## 2015-11-08 NOTE — Telephone Encounter (Signed)
Spoke with Kathleen Bond this afternoon.  She stated that she felt much better after paracentesis this am .  [place] drew off 2.9 liters of fluid.  She took some Tylenol for a sore abdomen after the procedure.  She is much more comfortable this evening. Told her to call the office if she runs a temp of 100.5 orgreater or any sign of infection, especially where the paracentesis needle was inserted as her ANC was 1.1 at visit 11-05-15. She will keep appointment with Dr. Marko Plume on 11-12-15 as scheduled. Dr. Marko Plume notified of improvement in patient's comfort as noted.

## 2015-11-08 NOTE — Procedures (Signed)
Ultrasound-guided therapeutic paracentesis performed yielding 2.9  liters of yellow  fluid. No immediate complications.

## 2015-11-08 NOTE — Telephone Encounter (Signed)
-----   Message from Gordy Levan, MD sent at 11/05/2015 11:35 AM EDT ----- Please check on her by phone later this week after paracentesis and let me know how she is  [place] 1.1 on 4-17 so held olaparib again, + tight abdomen apparent ascites when she saw Mikey Bussing  RN also please follow up to be sure paracentesis gets scheduled soon   thanks

## 2015-11-11 ENCOUNTER — Other Ambulatory Visit: Payer: Self-pay | Admitting: Oncology

## 2015-11-12 ENCOUNTER — Ambulatory Visit: Payer: Self-pay

## 2015-11-12 ENCOUNTER — Encounter: Payer: Self-pay | Admitting: Oncology

## 2015-11-12 ENCOUNTER — Telehealth: Payer: Self-pay | Admitting: Oncology

## 2015-11-12 ENCOUNTER — Ambulatory Visit (HOSPITAL_BASED_OUTPATIENT_CLINIC_OR_DEPARTMENT_OTHER): Payer: Self-pay | Admitting: Oncology

## 2015-11-12 ENCOUNTER — Ambulatory Visit (HOSPITAL_BASED_OUTPATIENT_CLINIC_OR_DEPARTMENT_OTHER): Payer: Self-pay

## 2015-11-12 VITALS — BP 124/84 | HR 80 | Temp 98.0°F | Resp 18 | Wt 130.0 lb

## 2015-11-12 DIAGNOSIS — Z79899 Other long term (current) drug therapy: Secondary | ICD-10-CM

## 2015-11-12 DIAGNOSIS — C562 Malignant neoplasm of left ovary: Secondary | ICD-10-CM

## 2015-11-12 DIAGNOSIS — R188 Other ascites: Secondary | ICD-10-CM

## 2015-11-12 DIAGNOSIS — Z1501 Genetic susceptibility to malignant neoplasm of breast: Secondary | ICD-10-CM

## 2015-11-12 DIAGNOSIS — T451X5A Adverse effect of antineoplastic and immunosuppressive drugs, initial encounter: Secondary | ICD-10-CM

## 2015-11-12 DIAGNOSIS — D701 Agranulocytosis secondary to cancer chemotherapy: Secondary | ICD-10-CM

## 2015-11-12 DIAGNOSIS — Z1509 Genetic susceptibility to other malignant neoplasm: Secondary | ICD-10-CM

## 2015-11-12 DIAGNOSIS — R180 Malignant ascites: Secondary | ICD-10-CM

## 2015-11-12 DIAGNOSIS — Z853 Personal history of malignant neoplasm of breast: Secondary | ICD-10-CM

## 2015-11-12 DIAGNOSIS — D6481 Anemia due to antineoplastic chemotherapy: Secondary | ICD-10-CM

## 2015-11-12 DIAGNOSIS — Z95828 Presence of other vascular implants and grafts: Secondary | ICD-10-CM

## 2015-11-12 DIAGNOSIS — E039 Hypothyroidism, unspecified: Secondary | ICD-10-CM

## 2015-11-12 LAB — CBC WITH DIFFERENTIAL/PLATELET
BASO%: 0.4 % (ref 0.0–2.0)
BASOS ABS: 0 10*3/uL (ref 0.0–0.1)
EOS ABS: 0 10*3/uL (ref 0.0–0.5)
EOS%: 0.3 % (ref 0.0–7.0)
HEMATOCRIT: 35.6 % (ref 34.8–46.6)
HGB: 11.3 g/dL — ABNORMAL LOW (ref 11.6–15.9)
LYMPH#: 1.2 10*3/uL (ref 0.9–3.3)
LYMPH%: 27 % (ref 14.0–49.7)
MCH: 29.5 pg (ref 25.1–34.0)
MCHC: 31.6 g/dL (ref 31.5–36.0)
MCV: 93.3 fL (ref 79.5–101.0)
MONO#: 0.4 10*3/uL (ref 0.1–0.9)
MONO%: 9.1 % (ref 0.0–14.0)
NEUT#: 2.9 10*3/uL (ref 1.5–6.5)
NEUT%: 63.2 % (ref 38.4–76.8)
PLATELETS: 290 10*3/uL (ref 145–400)
RBC: 3.82 10*6/uL (ref 3.70–5.45)
RDW: 14.8 % — ABNORMAL HIGH (ref 11.2–14.5)
WBC: 4.6 10*3/uL (ref 3.9–10.3)

## 2015-11-12 LAB — COMPREHENSIVE METABOLIC PANEL
ANION GAP: 8 meq/L (ref 3–11)
AST: 12 U/L (ref 5–34)
Albumin: 3.3 g/dL — ABNORMAL LOW (ref 3.5–5.0)
Alkaline Phosphatase: 63 U/L (ref 40–150)
BUN: 14.8 mg/dL (ref 7.0–26.0)
CALCIUM: 8.9 mg/dL (ref 8.4–10.4)
CHLORIDE: 100 meq/L (ref 98–109)
CO2: 28 mEq/L (ref 22–29)
CREATININE: 0.7 mg/dL (ref 0.6–1.1)
Glucose: 94 mg/dl (ref 70–140)
Potassium: 3.8 mEq/L (ref 3.5–5.1)
Sodium: 136 mEq/L (ref 136–145)
Total Protein: 6.7 g/dL (ref 6.4–8.3)

## 2015-11-12 MED ORDER — SODIUM CHLORIDE 0.9 % IJ SOLN
10.0000 mL | INTRAMUSCULAR | Status: DC | PRN
  Filled 2015-11-12: qty 10

## 2015-11-12 MED ORDER — HEPARIN SOD (PORK) LOCK FLUSH 100 UNIT/ML IV SOLN
500.0000 [IU] | Freq: Once | INTRAVENOUS | Status: DC | PRN
  Filled 2015-11-12: qty 5

## 2015-11-12 NOTE — Progress Notes (Signed)
Patient was approved for 100% FA discount 07/07/15-01/05/16. Emailed to billing on 10/23/15 to adjust account balance.

## 2015-11-12 NOTE — Patient Instructions (Signed)

## 2015-11-12 NOTE — Progress Notes (Signed)
Met with patient in lobby area to discuss status of Medicaid. Patient states she turned in all documents requested by DSS to her caseworker about a week ago. She hasn't heard anything else. Being that she just submitted the information, it could take up to 60 days to complete the application and decision to be made. DSS will notify the patient via mail of the decision. Patient has my number for any additional questions or concerns.

## 2015-11-12 NOTE — Telephone Encounter (Signed)
appt made and avs printed °

## 2015-11-12 NOTE — Progress Notes (Signed)
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 She had difficulty with side effects from initial doses of Lynparza, taken 4-6 thru 10-28-15 at 400 mg bid (diarrhea) then NV with one dose at 300 mg on 10-29-15.  [place] also dropped to 1.1 on 11-05-15, but has recovered today.  She required paracentesis for 2.9 liters on 11-08-15, for first time since 03-2015.  Patient was uncomfortable from rapid accumulation of ascites last week, had some soreness generally across abdomen just after paracentesis, but has felt much better for last 2-3 days. PO intake has improved markedly since the paracentesis. Bowels moved small amount this AM for first time in a few days (thinks from little po prior to paracentesis, note also diarrhea with Lonie Peak). No SOB, no pain now, good energy, no LE swelling, no change in breasts, no bleeding. No problems with PAC. No fever or symptoms of infection. Remainder of 10 point Review of Systems negative.    PAC flushed 10-29-15 BRCA 2 mutation ([id number]*) by [employer] 10-12-14 Flu vaccine 04-02-15 CA 125 on 04-09-15 baseline for gemzar 313, down to 27 in Jan 2017, then 58 on 10-08-15  Per follow up with [facility] financial advocate today, patient turned in all necessary forms for Medicaid a week ago.   ONCOLOGIC HISTORY Left ovarian cancer: Patient became symptomatic with abdominal swelling and constipation in late Nov 2015, seen and treated initially for what was thought to be ulcer. Symptoms continued, with early satiety causing poor po intake; she was seen again at [facility] ED in early Jan 2016, with CT reportedly consistent with stage IIIC or IV gyn cancer, including omental caking, large pelvic mass, para-aortic adenopathy, ascites, pleural effusions  and right subpleural nodule. She was seen in consultation by Dr Denman George on 07-25-14, with concern from her exam and scan for rectal involvement. 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 She had paracentesis of 3.4 liters ascites on 07-27-2014, with cytology ([id number]) showing serous carcinoma consistent with gyn primary, ER negative. She had right thoracentesis for 350 cc on 08-11-14, this requested by anesthesia prior to surgery and apparently not sent for cytology. Patient was more comfortable after paracentesis, but could not tell any difference in breathing after thoracentesis (denies SOB prior to that procedure). CA 125 on 08-02-14 was 1162. She was taken to exploratory laparotomy by Dr Denman George on 08-15-14, with 4 liters of ascites removed and biopsy of omentum obtained, with additional debulking not attempted due to surgical findings of entire peritoneal surface replaced by tumor plaque, small intestine coated with tumor and mesentery tethered with tumor nodules, ileum adherent to pelvic mass, dense plaque of tumor on right diaphragm and 10 cm omental cake in LUQ. Pathology of omental biopsy She needed another paracentesis by IR for 1.3 liters ascites on 08-17-14, and had PAC placed by IR also 08-17-14. Day 1 cycle 1 dose dense carboplatin taxol was given in hospital on 08-18-14. After 2 cycles of dose dense regimen, the carboplatin and taxol was changed to every 3 week regimen beginning cycle 3; she had cycle 5 on 11-09-14, with gCSF support. She had interval debulking by Dr Denman George on 11-28-14. Surgery was exploratory laparotomy with TAH/ BSO, omentectomy and radical tumor debulking; R1 resection with residual milial  tumor on small bowel at completion of procedure. Tumor involved bilateral adnexa, uterine serosa, omentum and falciform ligament, with + LVSI, no nodes examined. Maximum tumor size was 4.3 cm involving left ovary. She resumed carboplatin  Taxol on 12-21-14, total of 3 cycles thru 02-01-15 (= 3 cycles after interval debulking). Restaging CT CAP 03-12-15 had some changes concerning for residual or recurrent disease, and CA 125 was elevated to 64 on 03-14-15, having been 6 in July 2016.  Review of prior radiation and chemotherapy (for left breast cancer) significant for use of epirubicin and some unquantified cardiac exposure to radiation. She began gemcitabine 04-09-15. She was neutropenic by 04-16-15. Regimen was changed to every other week gemzar with neulasta support, gemzar dose also decreased beginning cycle 2. CA 125 on 04-09-15 baseline for gemzar 313, down to 27 in Jan 2017, then 35 in Feb and 39 early Jan 2017. She continued gemzar thru 09-24-15. Olaparib Lonie Peak) begun 10-25-15.  BRCA 2 abnormality identified 10-12-14.  Breast cancer: Left breast biopsy 11-22-2003 ([id number]) invasive carcinoma. Left breast wide excision with sentinel nodes and axillary contents by Dr Benson Norway 01-03-2004 [phone]) poorly differentiated invasive ductal carcinoma 3.0 cm with closest margin 1.7 cm deep, 6 sentinel nodes + 3 additional nodes all negative, ER 0, PR 0, HER 2 3+, Ki67 of 19%. . She received adjuvant chemotherapy by Dr Sonny Dandy with 5FU, epirubicin, cytoxan x 4 cycles from 02-13-2004 thru 04-16-2004, followed by taxol 80 mg/m2 weekly x 11 from 05-07-2004 thru 09-04-2004, and herceptin 05-21-2004 thru 02-25-2005. Antiemetics were Kytril and decadron. She had PAC for the chemotherapy. She had radiation by Dr Tyler Pita in [place]. Last note from Dr Sonny Dandy in this information was 04-2008. Left diagnostic mammogram [facility] 10-25-2014 stable compared with 12-06-13 and priors; bilateral at [facility] 02-13-15. Breast MRI 05-25-15 had no evidence of malignancy there, this medically necessary with BRCA 2 mutation.   Objective:  Vital signs in last 24 hours:  BP 124/84 mmHg  Pulse 80  Temp(Src) 98 F (36.7 C) (Oral)  Resp 18  Wt 130 lb (58.968 kg)  SpO2  97% Weight 133 on 4-17 prior to paracentesis. Respirations not labored RA. Looks comfortable. Alert, oriented and appropriate, very pleasant as always, sister very supportive. Ambulatory without difficulty.  No alopecia  HEENT:PERRL, sclerae not icteric. Oral mucosa moist without lesions, posterior pharynx clear.  Neck supple. No JVD.  Lymphatics:no cervical,supraclavicular, axillary or inguinal adenopathy Resp: slightly decreased BS left base otherwise clear to auscultation bilaterally  Cardio: regular rate and rhythm. No gallop. GI: soft, nontender,not tightly distended and no clear fluid wave now, full in upper abdomen. Normally active bowel sounds. Surgical incision not remarkable. Musculoskeletal/ Extremities: without pitting edema, cords, tenderness Neuro: no change peripheral neuropathy. Otherwise nonfocal. PSYCH appropriate mood and affect Skin without rash, ecchymosis, petechiae Breast exam not repeated Portacath-without erythema or tenderness  Lab Results:  Results for orders placed or performed in visit on 11/12/15  CBC with Differential  Result Value Ref Range   WBC 4.6 3.9 - 10.3 10e3/uL   NEUT# 2.9 1.5 - 6.5 10e3/uL   HGB 11.3 (L) 11.6 - 15.9 g/dL   HCT 35.6 34.8 - 46.6 %   Platelets 290 145 - 400 10e3/uL   MCV 93.3 79.5 - 101.0 fL   MCH 29.5 25.1 - 34.0 pg   MCHC 31.6 31.5 - 36.0 g/dL   RBC 3.82 3.70 - 5.45 10e6/uL   RDW 14.8 (H) 11.2 - 14.5 %   lymph# 1.2 0.9 -  3.3 10e3/uL   MONO# 0.4 0.1 - 0.9 10e3/uL   Eosinophils Absolute 0.0 0.0 - 0.5 10e3/uL   Basophils Absolute 0.0 0.0 - 0.1 10e3/uL   NEUT% 63.2 38.4 - 76.8 %   LYMPH% 27.0 14.0 - 49.7 %   MONO% 9.1 0.0 - 14.0 %   EOS% 0.3 0.0 - 7.0 %   BASO% 0.4 0.0 - 2.0 %  Comprehensive metabolic panel  Result Value Ref Range   Sodium 136 136 - 145 mEq/L   Potassium 3.8 3.5 - 5.1 mEq/L   Chloride 100 98 - 109 mEq/L   CO2 28 22 - 29 mEq/L   Glucose 94 70 - 140 mg/dl   BUN 14.8 7.0 - 26.0 mg/dL   Creatinine 0.7  0.6 - 1.1 mg/dL   Total Bilirubin <0.30 0.20 - 1.20 mg/dL   Alkaline Phosphatase 63 40 - 150 U/L   AST 12 5 - 34 U/L   ALT <9 0 - 55 U/L   Total Protein 6.7 6.4 - 8.3 g/dL   Albumin 3.3 (L) 3.5 - 5.0 g/dL   Calcium 8.9 8.4 - 10.4 mg/dL   Anion Gap 8 3 - 11 mEq/L   EGFR >90 >90 ml/min/1.73 m2   CA 125 on 10-29-15 was 167, this having been 58 on 10-08-15 and 39 on 09-19-15  Studies/Results: US paracentesis for 2.9 liters on 11-08-15  Medications: I have reviewed the patient's current medications. Lynparza 100 mg bid with antiemetic premeds as noted, this on pharmaceutical assistance program Careful colace if bowels not moving in next few days Reminded her that ativan can be used SL if vomiting.  DISCUSSION Rapid progression of recurrent gyn cancer reviewed, including symptomatic ascites. She understands that getting to another effective systemic treatment should improve this problem again, and in meantime she may need repeat paracentesis to manage symptoms. I have asked her to try to give [place] 1-2 days advance notice if she feels that she needs paracentesis set up. We have discussed problems with the first few doses of Lynparza attempted, which may be dose related. With possible good response of BRCA + disease with this agent, I have encouraged her to try again at lower dose. Recommendations for that agent are to reduce to 200 mg bid for various symptoms (50 mg tablets), however just to try resuming she has been instructed to use 2 tablets = 100 mg bid now. She will take zofran + ativan ~ 30-45 min prior to 100 mg dose at bedtime tonight, sister to be sure she takes the [place] correctly in case drowsy from the ativan. She will take at least zofran 30-45 min prior to 100 mg Lynparza in AM 4-25. She will not take miralax or other bowel med prior to 4-25 afternoon at earliest, then would use only colace if bowels still not moving otherwise. She will update RN tomorrow early afternoon. If tolerating, would  continue 100 mg bid another 24-48 hrs and update again then by phone.  If she cannot tolerate the [place] she knows that we will stop this, however she is willing to try as above.  Discussed good nutrition especially with protein losses from ascites. She will resume [employer].     Assessment/Plan:  1. 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 Rapid recurrence/ progression of gyn cancer within 4 weeks of  completing that chemo. Paracentesis confirmed serous malignancy. Responded initially to gemzar but progression by early March 2017. Olaparib begun 10-25-15 at 400 mg bid, diarrhea day 4, then NV with attempt at resuming 300 mg bid; ANC 1.1 by 11-05-15 but recovered now. Feels much better since paracentesis 2.9 liters on 11-08-15. Will try just 100 mg bid with antiemetic premeds and no laxatives now. 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 BRCA 2+. Bilateral mammograms at [facility] done 02-13-15, breast MRI done 05-25-15, no evidence of active malignancy then. 4.PAC in, will need to flush every 6-8 weeks when not otherwise used.  5.chemo and iron deficiency anemia: hemoglobin improved, continue oral iron.  6.nutritional status: early satiety with ascites, better post paracentesis 4 days ago. Resume [employer].  7.RBBB and LAFB, HTN, known to Dr Acie Fredrickson. Previous epirubicin, some cardiac exposure from left breast radiation. 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 Medicaid application still pending. 11.Flu vaccine 04-02-15 12.taxol peripheral neuropathy somewhat improved, continues gabapentin prn at hs 13.advance directives in  place 63.chemo neutropenia with gemzar, required neulasta   All questions answered. Written and oral instructions for Lynparza as above. I will see her back with labs 1 week and 2 weeks. Message to RN to follow up on 4-25. Time spent 30 min including >50% counseling and coordination of care.    LIVESAY,LENNIS P, MD   11/13/2015, 8:49 AM

## 2015-11-13 ENCOUNTER — Telehealth: Payer: Self-pay

## 2015-11-13 NOTE — Telephone Encounter (Signed)
Spoke with [name]. She took the antiemetics as noted below.  She is feeling nauseous but not as bad as the previous time taking the [place]  She has not vomited.  Her abdomen is hurting a little.  Pain is 3-4/10. Better then last time. Suggested that she take another Ativan tab now (she is taking it SL) and take the next dose of zofran in an hour. She has taken in ~one glass of ginger ale.  She has not eaten any food as she is not hungry.  Encouraged her to try to increase fluid intake to ~ 8 oz ~2 hours. She can count the broth with soup, jello, ice cream, and popsicles.   She  had a good BM last evening.  Will not use colace today but told her to take tomorrow afternoon if no BM. Told Ms. Kathleen Bond to call if any problems with getting fluids in and or having severe vomiting. Will call her later this week to see how she is doing.

## 2015-11-13 NOTE — Telephone Encounter (Signed)
-----   Message from Gordy Levan, MD sent at 11/12/2015  5:42 PM EDT ----- Needs to speak with RN on 4-25. She had so much trouble getting thru lines last time that I told her if you did not hear from her by early afternoon, hopefully you will call her.  She has been much better since paracentesis 4-20. No nausea, no BM last few days, now eating. CBC all ok now. She is to take zofran and ativan 30-45 min before 2 tablets = 100 mg Lynparza tonight, then at least zofran 30-45 min before 2 tablets of Lynparza in AM 4-25.  She will not use any laxatives at least before tomorrow afternoon. If bowels still have not moved when you speak with her, would start with just one colace as diarrhea was main problem on initial full dose Lynparza.  If tolerates this dose we may use it next few days.   Will need to check on her by phone later in week also.   thanks

## 2015-11-16 NOTE — Telephone Encounter (Signed)
Told ms Deschamp that [employer] [place] is gone for the day and will find out Monday 11-19-15 if they can do a paracentesis after Dr. Mariana Kaufman appointment. Dr. Marko Plume daid that she can stop taking the [facility] for now.  She got in a good dose this week. Ms. Amor verbalized understanding.

## 2015-11-16 NOTE — Telephone Encounter (Signed)
Ms. Norgren states that she had 3 diarrheal stools on Wednesday 11-14-15.  Did not take colace.  Took am and pm doses of Lynparza. Thursday took both doses as well. No BM.  This am took morning dose.  Taken colace this am as well. No BM yet today. She has bee in the bed most of the day.  She has not eaten anything and only had coffee this am.  Encouraged her to increase fluid intake. Ms. Hyun states that her abdomen has gotten tighter the last couple of days.

## 2015-11-18 ENCOUNTER — Other Ambulatory Visit: Payer: Self-pay | Admitting: Oncology

## 2015-11-19 ENCOUNTER — Encounter: Payer: Self-pay | Admitting: Oncology

## 2015-11-19 ENCOUNTER — Ambulatory Visit (HOSPITAL_BASED_OUTPATIENT_CLINIC_OR_DEPARTMENT_OTHER): Payer: Self-pay

## 2015-11-19 ENCOUNTER — Other Ambulatory Visit (HOSPITAL_BASED_OUTPATIENT_CLINIC_OR_DEPARTMENT_OTHER): Payer: Self-pay

## 2015-11-19 ENCOUNTER — Ambulatory Visit (HOSPITAL_BASED_OUTPATIENT_CLINIC_OR_DEPARTMENT_OTHER): Payer: Self-pay | Admitting: Oncology

## 2015-11-19 ENCOUNTER — Ambulatory Visit (HOSPITAL_COMMUNITY)
Admission: RE | Admit: 2015-11-19 | Discharge: 2015-11-19 | Disposition: A | Discharge status: Home / Self Care | Payer: Self-pay | Source: Ambulatory Visit | Attending: Oncology | Admitting: Oncology

## 2015-11-19 VITALS — BP 121/87 | HR 83 | Temp 98.1°F | Resp 17 | Ht 62.0 in | Wt 131.6 lb

## 2015-11-19 DIAGNOSIS — Z79899 Other long term (current) drug therapy: Secondary | ICD-10-CM

## 2015-11-19 DIAGNOSIS — C562 Malignant neoplasm of left ovary: Secondary | ICD-10-CM

## 2015-11-19 DIAGNOSIS — Z95828 Presence of other vascular implants and grafts: Secondary | ICD-10-CM

## 2015-11-19 DIAGNOSIS — R180 Malignant ascites: Secondary | ICD-10-CM | POA: Insufficient documentation

## 2015-11-19 DIAGNOSIS — E039 Hypothyroidism, unspecified: Secondary | ICD-10-CM

## 2015-11-19 DIAGNOSIS — Z1509 Genetic susceptibility to other malignant neoplasm: Secondary | ICD-10-CM

## 2015-11-19 DIAGNOSIS — Z1501 Genetic susceptibility to malignant neoplasm of breast: Principal | ICD-10-CM

## 2015-11-19 DIAGNOSIS — C569 Malignant neoplasm of unspecified ovary: Secondary | ICD-10-CM

## 2015-11-19 DIAGNOSIS — T451X5A Adverse effect of antineoplastic and immunosuppressive drugs, initial encounter: Secondary | ICD-10-CM

## 2015-11-19 DIAGNOSIS — R112 Nausea with vomiting, unspecified: Secondary | ICD-10-CM

## 2015-11-19 LAB — CBC WITH DIFFERENTIAL/PLATELET
BASO%: 0.6 % (ref 0.0–2.0)
BASOS ABS: 0 10*3/uL (ref 0.0–0.1)
EOS ABS: 0 10*3/uL (ref 0.0–0.5)
EOS%: 0.6 % (ref 0.0–7.0)
HEMATOCRIT: 34.4 % — AB (ref 34.8–46.6)
HGB: 11.1 g/dL — ABNORMAL LOW (ref 11.6–15.9)
LYMPH%: 35.5 % (ref 14.0–49.7)
MCH: 29.8 pg (ref 25.1–34.0)
MCHC: 32.3 g/dL (ref 31.5–36.0)
MCV: 92.5 fL (ref 79.5–101.0)
MONO#: 0.5 10*3/uL (ref 0.1–0.9)
MONO%: 13 % (ref 0.0–14.0)
NEUT#: 1.8 10*3/uL (ref 1.5–6.5)
NEUT%: 50.3 % (ref 38.4–76.8)
Platelets: 266 10*3/uL (ref 145–400)
RBC: 3.72 10*6/uL (ref 3.70–5.45)
RDW: 14.1 % (ref 11.2–14.5)
WBC: 3.6 10*3/uL — ABNORMAL LOW (ref 3.9–10.3)
lymph#: 1.3 10*3/uL (ref 0.9–3.3)

## 2015-11-19 LAB — COMPREHENSIVE METABOLIC PANEL
ALK PHOS: 57 U/L (ref 40–150)
ALT: <9 U/L (ref 0–55)
AST: 14 U/L (ref 5–34)
Albumin: 3.3 g/dL — ABNORMAL LOW (ref 3.5–5.0)
Anion Gap: 6 mEq/L (ref 3–11)
BUN: 13 mg/dL (ref 7.0–26.0)
CALCIUM: 8.8 mg/dL (ref 8.4–10.4)
CHLORIDE: 105 meq/L (ref 98–109)
CO2: 28 mEq/L (ref 22–29)
Creatinine: 0.8 mg/dL (ref 0.6–1.1)
EGFR: 87 mL/min/{1.73_m2} — AB (ref >90)
Glucose: 107 mg/dl (ref 70–140)
POTASSIUM: 3.6 meq/L (ref 3.5–5.1)
SODIUM: 139 meq/L (ref 136–145)
Total Bilirubin: 0.44 mg/dL (ref 0.20–1.20)
Total Protein: 6.5 g/dL (ref 6.4–8.3)

## 2015-11-19 MED ORDER — HEPARIN SOD (PORK) LOCK FLUSH 100 UNIT/ML IV SOLN
500.0000 [IU] | Freq: Once | INTRAVENOUS | Status: AC | PRN
  Administered 2015-11-19: 500 [IU] via INTRAVENOUS
  Filled 2015-11-19: qty 5

## 2015-11-19 MED ORDER — ONDANSETRON HCL 8 MG PO TABS: 8.0000 mg | ORAL_TABLET | Freq: Three times a day (TID) | ORAL | Status: AC | PRN

## 2015-11-19 MED ORDER — SODIUM CHLORIDE 0.9 % IJ SOLN
10.0000 mL | INTRAMUSCULAR | Status: DC | PRN
  Administered 2015-11-19: 10 mL via INTRAVENOUS
  Filled 2015-11-19: qty 10

## 2015-11-19 NOTE — Progress Notes (Signed)
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 Disease progressed rapidly, including recurrent malignant ascites, after initial extended response to gemzar. She began olaparib Lonie Peak) on 10-25-15, but has had difficulty tolerating this so far.   Patient took Lynparza at 400 mg bid from 4-6 thru 10-28-15, with diarrhea. She took one dose of 300 mg on 10-29-15 with nausea and vomiting. [place] dropped to  1.1 on 11-05-15, but recovered on brief treatment break. She tolerated the Lynparza at 100 mg bid x 5 doses from 4-26 thru 11-16-15 using antiemetic premedication. By 4-28 she was again very uncomfortable from ascites, stopping the [place] and taking almost nothing po thru today.  She is for US paracentesis after this MD visit.  Previous paracentesis was on 11-08-15 for 2.9 liters (prior to that needed paracentesis last in Sept 2016).   Patient denies abdominal pain, but is uncomfortably tight in abdomen. Bowels moved yesterday for first time in 3 days. She had sips of coffee and 2 spoonfuls of soup in last 24 hours - discussed. She denies SOB, fever, LE swelling, bleeding, problems with PAC. No new or different pain otherwise Remainder of 10 point Review of Systems negative  PAC flushed 10-29-15 BRCA 2 mutation ([id number]*) by [employer] 10-12-14 Flu vaccine 04-02-15 CA 125 on 04-09-15 baseline for gemzar 313, down to 27 in Jan 2017, then 54 on 10-08-15     ONCOLOGIC HISTORY Left ovarian cancer: Patient became symptomatic with abdominal swelling and constipation in late Nov 2015, seen and treated initially for what was thought to be ulcer. Symptoms continued, with early satiety causing poor po intake; she was seen again at [facility] ED in early Jan 2016, with CT reportedly consistent with  stage IIIC or IV gyn cancer, including omental caking, large pelvic mass, para-aortic adenopathy, ascites, pleural effusions and right subpleural nodule. She was seen in consultation by Dr Denman George on 07-25-14, with concern from her exam and scan for rectal involvement. 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 She had paracentesis of 3.4 liters ascites on 07-27-2014, with cytology ([id number]) showing serous carcinoma consistent with gyn primary, ER negative. She had right thoracentesis for 350 cc on 08-11-14, this requested by anesthesia prior to surgery and apparently not sent for cytology. Patient was more comfortable after paracentesis, but could not tell any difference in breathing after thoracentesis (denies SOB prior to that procedure). CA 125 on 08-02-14 was 1162. She was taken to exploratory laparotomy by Dr Denman George on 08-15-14, with 4 liters of ascites removed and biopsy of omentum obtained, with additional debulking not attempted due to surgical findings of entire peritoneal surface replaced by tumor plaque, small intestine coated with tumor and mesentery tethered with tumor nodules, ileum adherent to pelvic mass, dense plaque of tumor on right diaphragm and 10 cm omental cake in LUQ. Pathology of omental biopsy She needed another paracentesis by IR for 1.3 liters ascites on 08-17-14, and had PAC placed by IR also 08-17-14. Day 1 cycle 1 dose dense carboplatin taxol was given in hospital on 08-18-14. After 2 cycles of dose dense regimen, the carboplatin and taxol was changed to every 3 week regimen beginning cycle 3; she had cycle 5 on 11-09-14, with gCSF support. She had interval debulking by Dr Denman George on  11-28-14. Surgery was exploratory laparotomy with TAH/ BSO, omentectomy and radical tumor debulking; R1 resection with residual milial tumor on small bowel at completion of procedure. Tumor involved bilateral adnexa, uterine serosa, omentum and  falciform ligament, with + LVSI, no nodes examined. Maximum tumor size was 4.3 cm involving left ovary. She resumed carboplatin Taxol on 12-21-14, total of 3 cycles thru 02-01-15 (= 3 cycles after interval debulking). Restaging CT CAP 03-12-15 had some changes concerning for residual or recurrent disease, and CA 125 was elevated to 64 on 03-14-15, having been 6 in July 2016.  Review of prior radiation and chemotherapy (for left breast cancer) significant for use of epirubicin and some unquantified cardiac exposure to radiation. She began gemcitabine 04-09-15. She was neutropenic by 04-16-15. Regimen was changed to every other week gemzar with neulasta support, gemzar dose also decreased beginning cycle 2. CA 125 on 04-09-15 baseline for gemzar 313, down to 27 in Jan 2017, then 35 in Feb and 39 early Jan 2017. She continued gemzar thru 09-24-15. Olaparib Lonie Peak) begun 10-25-15.  BRCA 2 abnormality identified 10-12-14.  Breast cancer: Left breast biopsy 11-22-2003 ([id number]) invasive carcinoma. Left breast wide excision with sentinel nodes and axillary contents by Dr Benson Norway 01-03-2004 [phone]) poorly differentiated invasive ductal carcinoma 3.0 cm with closest margin 1.7 cm deep, 6 sentinel nodes + 3 additional nodes all negative, ER 0, PR 0, HER 2 3+, Ki67 of 19%. . She received adjuvant chemotherapy by Dr Sonny Dandy with 5FU, epirubicin, cytoxan x 4 cycles from 02-13-2004 thru 04-16-2004, followed by taxol 80 mg/m2 weekly x 11 from 05-07-2004 thru 09-04-2004, and herceptin 05-21-2004 thru 02-25-2005. Antiemetics were Kytril and decadron. She had PAC for the chemotherapy. She had radiation by Dr Tyler Pita in [place]. Last note from Dr Sonny Dandy in this information was 04-2008. Left diagnostic mammogram [facility] 10-25-2014 stable compared with 12-06-13 and priors; bilateral at [facility] 02-13-15. Breast MRI 05-25-15 had no evidence of malignancy there, this medically necessary with BRCA 2 mutation.    Objective:  Vital  signs in last 24 hours:  BP 121/87 mmHg  Pulse 83  Temp(Src) 98.1 F (36.7 C) (Oral)  Resp 17  Ht 5' 2"  (1.575 m)  Wt 131 lb 9.6 oz (59.693 kg)  BMI 24.06 kg/m2  SpO2 94% Weight up 1.5 lbs. Alert, oriented and appropriate. Ambulatory without difficulty. Looks mildly uncomfortable but not in acute distress. Abdomen distended. Respirations not labored RA.  HEENT:PERRL, sclerae not icteric. Oral mucosa moist without lesions, posterior pharynx clear.  Neck supple. No JVD.  Lymphatics:nosupraclavicular adenopathy Resp: clear to auscultation bilaterally and normal percussion bilaterally Cardio: regular rate and rhythm. No gallop. GI: soft, nontender, tightly distended, cannot appreciate mass or organomegaly and so tight that I cannot appreciate fluid wave. Normally active bowel sounds. Surgical incision not remarkable. Musculoskeletal/ Extremities: without pitting edema, cords, tenderness Neuro: no increase peripheral neuropathy. Otherwise nonfocal Skin without rash, ecchymosis, petechiae Breasts: Left no change in residual superior thickening Portacath-without erythema or tenderness  Lab Results:  Results for orders placed or performed in visit on 11/19/15  CBC with Differential  Result Value Ref Range   WBC 3.6 (L) 3.9 - 10.3 10e3/uL   NEUT# 1.8 1.5 - 6.5 10e3/uL   HGB 11.1 (L) 11.6 - 15.9 g/dL   HCT 34.4 (L) 34.8 - 46.6 %   Platelets 266 145 - 400 10e3/uL   MCV 92.5 79.5 - 101.0 fL   MCH 29.8 25.1 - 34.0 pg   MCHC 32.3 31.5 - 36.0  g/dL   RBC 3.72 3.70 - 5.45 10e6/uL   RDW 14.1 11.2 - 14.5 %   lymph# 1.3 0.9 - 3.3 10e3/uL   MONO# 0.5 0.1 - 0.9 10e3/uL   Eosinophils Absolute 0.0 0.0 - 0.5 10e3/uL   Basophils Absolute 0.0 0.0 - 0.1 10e3/uL   NEUT% 50.3 38.4 - 76.8 %   LYMPH% 35.5 14.0 - 49.7 %   MONO% 13.0 0.0 - 14.0 %   EOS% 0.6 0.0 - 7.0 %   BASO% 0.6 0.0 - 2.0 %  Comprehensive metabolic panel  Result Value Ref Range   Sodium 139 136 - 145 mEq/L   Potassium 3.6 3.5 -  5.1 mEq/L   Chloride 105 98 - 109 mEq/L   CO2 28 22 - 29 mEq/L   Glucose 107 70 - 140 mg/dl   BUN 13.0 7.0 - 26.0 mg/dL   Creatinine 0.8 0.6 - 1.1 mg/dL   Total Bilirubin 0.44 0.20 - 1.20 mg/dL   Alkaline Phosphatase 57 40 - 150 U/L   AST 14 5 - 34 U/L   ALT <9 0 - 55 U/L   Total Protein 6.5 6.4 - 8.3 g/dL   Albumin 3.3 (L) 3.5 - 5.0 g/dL   Calcium 8.8 8.4 - 10.4 mg/dL   Anion Gap 6 3 - 11 mEq/L   EGFR 87 (L) >90 ml/min/1.73 m2   CA 125 on 10-29-15 was 167, on 10-08-15 was 58 and on 09-19-15 was 39  Studies/Results: EXAM: ULTRASOUND GUIDED THERAPEUTIC PARACENTESIS  11-19-15, following MD visit  MEDICATIONS: None.  COMPLICATIONS: None immediate.  PROCEDURE: Informed written consent was obtained from the patient after a discussion of the risks, benefits and alternatives to treatment. A timeout was performed prior to the initiation of the procedure.  Initial ultrasound scanning demonstrates a moderate amount of ascites within the right mid to lower abdominal quadrant. The right mid to lower abdomen was prepped and draped in the usual sterile fashion. 1% lidocaine was used for local anesthesia.  Following this, a Yueh catheter was introduced. An ultrasound image was saved for documentation purposes. The paracentesis was performed. The catheter was removed and a dressing was applied. The patient tolerated the procedure well without immediate post procedural complication.  FINDINGS: A total of approximately 2.5 liters of blood-tinged fluid was removed.  IMPRESSION: Successful ultrasound-guided therapeutic paracentesis yielding 2.5 liters of peritoneal fluid  Medications: I have reviewed the patient's current medications.  DISCUSSION Situation not improving with the very limited amount of Lynparza that she has been able to take. Hopefully she will be more comfortable after paracentesis and will be able to take advantage to get in good nutrition and to resume [place]  We will follow up by phone tomorrow.  Discussed need for supplements and real effort re the nutrition and the [place] She knows to call if needed before visit next week.   Will ask [place] nutritionist to assist.  Not discussed: if no improvement soon/ if not able to continue reasonable dosing of Lynparza may need to change back to chemo and/or  avastin. Doxil not appropriate with previous epirubicin and left breast radiation. Doubt oral VP 16 possible. Consider Alimta or weekly topotecan, or weekly topotecan + avastin.   Assessment/Plan:  1. 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 Rapid recurrence/ progression of gyn cancer within 4 weeks  of completing that chemo. Paracentesis confirmed serous malignancy. Responded initially to gemzar but progression by early March 2017. Olaparib begun 10-25-15 at 400 mg bid, diarrhea day 4, then NV with attempt at resuming 300 mg bid; ANC 1.1 by 11-05-15 but recovered now.Tried just 100 mg bid with antiemetic premeds and no laxatives now after paracentesis on 4-20. Rapid recurrence of ascites, plan as above.  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 BRCA 2+. Bilateral mammograms at [facility] done 02-13-15, breast MRI done 05-25-15, no evidence of active malignancy then. 4.PAC in, will need to flush every 6-8 weeks when not otherwise used.  5.chemo and iron deficiency anemia: hemoglobin improved, continue oral iron.  6.nutritional status: early satiety with ascites, needs to use [employer], nutritionist to assist 7.RBBB and LAFB, HTN, known to Dr Acie Fredrickson. Previous epirubicin, some cardiac exposure from left breast radiation. 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 9.post parathyroidectomy 2005 10.insurance concerns: appreciate help from [facility] staff. Medicaid application still pending. 11.Flu vaccine 04-02-15 12.taxol peripheral neuropathy somewhat improved, continues gabapentin prn at hs 13.advance directives in place 53.chemo neutropenia with gemzar, required neulasta  All questions answered. Time spent 25 min including >50% counseling and coordination of care. Route Dr Eula Fried.     Gordy Levan, MD   11/21/2015, 8:41 PM

## 2015-11-19 NOTE — Procedures (Signed)
Ultrasound-guided therapeutic paracentesis performed yielding 2.5  liters of blood-tinged fluid. No immediate complications.

## 2015-11-19 NOTE — Patient Instructions (Signed)

## 2015-11-20 ENCOUNTER — Telehealth: Payer: Self-pay

## 2015-11-20 NOTE — Telephone Encounter (Signed)
Spoke with Ms. Kathleen Bond and she is feeling better after paracentesis.  She ate some breakfast and drinking lemonade. She is agreeable to another 3-4 days of Kathleen Bond 100 mg bid.  She will begin fresh in the morning 11-21-15.   She will call Friday 11-23-15 to let Dr. Marko Plume know how she is doing. She will take antiemetics as previously directed prior to [facility] and use colace carefully.  Previous instructions to patient from Dr. Marko Plume noted below.  ----- Message from Gordy Levan, MD sent at 11/12/2015 5:42 PM EDT ----- Needs to speak with RN on 4-25. She had so much trouble getting thru lines last time that I told her if you did not hear from her by early afternoon, hopefully you will call her.  She has been much better since paracentesis 4-20. No nausea, no BM last few days, now eating. CBC all ok now. She is to take zofran and ativan 30-45 min before 2 tablets = 100 mg Kathleen Bond tonight, then at least zofran 30-45 min before 2 tablets of Kathleen Bond in AM 4-25.  She will not use any laxatives at least before tomorrow afternoon. If bowels still have not moved when you speak with her, would start with just one colace as diarrhea was main problem on initial full dose Kathleen Bond. If tolerates this dose we may use it next few days.   Will need to check on her by phone later in week also.   thanks

## 2015-11-20 NOTE — Telephone Encounter (Signed)
Kathleen Bond has 3 bottles of Lynparza left. Each bottle contains 112 tabs.  She still jas many in bottle #1 also.

## 2015-11-20 NOTE — Telephone Encounter (Signed)
-----   Message from Gordy Levan, MD sent at 11/20/2015  8:29 AM EDT ----- She is to let us know how she is today after paracentesis on 11-19-15. If feels better, as I expect, I would like her to try the [place] again for next 3-4 days at the 100 mg = 2 tabs bid dose -- hopefully she will tolerate this better after paracentesis. She should take antiemetics before each dose as last time I am not sure how many tabs she has left. (may be best not to refill, but let me know if just a few)  Need to encourage her to eat as well as she can right now. I will ask Raford Pitcher to see her with my apt next week  thanks

## 2015-11-23 ENCOUNTER — Telehealth: Payer: Self-pay

## 2015-11-23 NOTE — Telephone Encounter (Signed)
Kathleen Bond began the Lonie Peak this week on Wednesday 11-21-15 in the am.  This evening's dose will complete three days.  She is feeling tired/weaktoday and in bed.  She istated that she actually will try to get a 4th day in on Saturday. No diarrhea.  She actually is moving bowels every other day after taking Miralax.   The nausea meds are helping with the nausea. Tylenol is helping with the abdominal pain, especially am after evening dose of Lynparza. She is eating more and drinking more then she has been.   Very minimal fluid has accumulated in abdomen thus far.  She does not feel that she needs a paracentesis scheduled for Monday 11-26-15.

## 2015-11-23 NOTE — Telephone Encounter (Signed)
-----   Message from Gordy Levan, MD sent at 11/21/2015  9:19 PM EDT ----- On Fri 5-4, could you please check on her by phone, particularly to see if she might need paracentesis again when she sees me on 5-8  Hopefully she has been able to take the Lynparza 100 mg bid with premed antiemetics  Hopefully she is drinking [employer] and eating small amounts of high protein food regularly  thanks

## 2015-11-25 ENCOUNTER — Other Ambulatory Visit: Payer: Self-pay | Admitting: Oncology

## 2015-11-25 DIAGNOSIS — C561 Malignant neoplasm of right ovary: Secondary | ICD-10-CM

## 2015-11-26 ENCOUNTER — Telehealth: Payer: Self-pay | Admitting: Oncology

## 2015-11-26 ENCOUNTER — Encounter: Payer: Self-pay | Admitting: Oncology

## 2015-11-26 ENCOUNTER — Ambulatory Visit (HOSPITAL_BASED_OUTPATIENT_CLINIC_OR_DEPARTMENT_OTHER): Payer: Self-pay | Admitting: Oncology

## 2015-11-26 ENCOUNTER — Ambulatory Visit (HOSPITAL_BASED_OUTPATIENT_CLINIC_OR_DEPARTMENT_OTHER): Payer: Self-pay

## 2015-11-26 ENCOUNTER — Other Ambulatory Visit (HOSPITAL_BASED_OUTPATIENT_CLINIC_OR_DEPARTMENT_OTHER): Payer: Self-pay

## 2015-11-26 VITALS — BP 111/84 | HR 95 | Temp 98.4°F | Resp 18 | Ht 62.0 in | Wt 127.4 lb

## 2015-11-26 DIAGNOSIS — Z1501 Genetic susceptibility to malignant neoplasm of breast: Secondary | ICD-10-CM

## 2015-11-26 DIAGNOSIS — C562 Malignant neoplasm of left ovary: Secondary | ICD-10-CM

## 2015-11-26 DIAGNOSIS — Z853 Personal history of malignant neoplasm of breast: Secondary | ICD-10-CM

## 2015-11-26 DIAGNOSIS — C561 Malignant neoplasm of right ovary: Secondary | ICD-10-CM

## 2015-11-26 DIAGNOSIS — T451X5A Adverse effect of antineoplastic and immunosuppressive drugs, initial encounter: Secondary | ICD-10-CM

## 2015-11-26 DIAGNOSIS — Z95828 Presence of other vascular implants and grafts: Secondary | ICD-10-CM

## 2015-11-26 DIAGNOSIS — Z1509 Genetic susceptibility to other malignant neoplasm: Principal | ICD-10-CM

## 2015-11-26 DIAGNOSIS — R180 Malignant ascites: Secondary | ICD-10-CM

## 2015-11-26 DIAGNOSIS — R112 Nausea with vomiting, unspecified: Secondary | ICD-10-CM

## 2015-11-26 DIAGNOSIS — E46 Unspecified protein-calorie malnutrition: Secondary | ICD-10-CM

## 2015-11-26 LAB — COMPREHENSIVE METABOLIC PANEL
AST: 12 U/L (ref 5–34)
Albumin: 3.3 g/dL — ABNORMAL LOW (ref 3.5–5.0)
Alkaline Phosphatase: 58 U/L (ref 40–150)
Anion Gap: 9 mEq/L (ref 3–11)
BILIRUBIN TOTAL: 0.35 mg/dL (ref 0.20–1.20)
BUN: 11.8 mg/dL (ref 7.0–26.0)
CO2: 27 meq/L (ref 22–29)
CREATININE: 0.8 mg/dL (ref 0.6–1.1)
Calcium: 8.7 mg/dL (ref 8.4–10.4)
Chloride: 101 mEq/L (ref 98–109)
EGFR: 87 mL/min/{1.73_m2} — ABNORMAL LOW (ref >90)
GLUCOSE: 105 mg/dL (ref 70–140)
Potassium: 3.5 mEq/L (ref 3.5–5.1)
SODIUM: 137 meq/L (ref 136–145)
TOTAL PROTEIN: 6.5 g/dL (ref 6.4–8.3)

## 2015-11-26 LAB — CBC WITH DIFFERENTIAL/PLATELET
BASO%: 0.3 % (ref 0.0–2.0)
Basophils Absolute: 0 10*3/uL (ref 0.0–0.1)
EOS%: 0.5 % (ref 0.0–7.0)
Eosinophils Absolute: 0 10*3/uL (ref 0.0–0.5)
HCT: 36 % (ref 34.8–46.6)
HGB: 11.7 g/dL (ref 11.6–15.9)
LYMPH#: 1.4 10*3/uL (ref 0.9–3.3)
LYMPH%: 35.2 % (ref 14.0–49.7)
MCH: 29.9 pg (ref 25.1–34.0)
MCHC: 32.5 g/dL (ref 31.5–36.0)
MCV: 92.1 fL (ref 79.5–101.0)
MONO#: 0.6 10*3/uL (ref 0.1–0.9)
MONO%: 15.1 % — AB (ref 0.0–14.0)
NEUT%: 48.9 % (ref 38.4–76.8)
NEUTROS ABS: 1.9 10*3/uL (ref 1.5–6.5)
NRBC: 0 % (ref 0–0)
Platelets: 272 10*3/uL (ref 145–400)
RBC: 3.91 10*6/uL (ref 3.70–5.45)
RDW: 14.1 % (ref 11.2–14.5)
WBC: 3.8 10*3/uL — AB (ref 3.9–10.3)

## 2015-11-26 MED ORDER — HEPARIN SOD (PORK) LOCK FLUSH 100 UNIT/ML IV SOLN
500.0000 [IU] | Freq: Once | INTRAVENOUS | Status: AC | PRN
  Administered 2015-11-26: 500 [IU] via INTRAVENOUS
  Filled 2015-11-26: qty 5

## 2015-11-26 MED ORDER — ALTEPLASE 2 MG IJ SOLR
2.0000 mg | Freq: Once | INTRAMUSCULAR | Status: DC | PRN
  Filled 2015-11-26: qty 2

## 2015-11-26 MED ORDER — SODIUM CHLORIDE 0.9 % IJ SOLN
10.0000 mL | INTRAMUSCULAR | Status: DC | PRN
  Administered 2015-11-26: 10 mL via INTRAVENOUS
  Filled 2015-11-26: qty 10

## 2015-11-26 NOTE — Progress Notes (Signed)
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 She is BRCA 2+, however unfortunately has had a very difficult time attempting olaparib, with extreme fatigue and GI side effects.  Patient took Lynparza at 400 mg bid from 4-6 thru 10-28-15, with diarrhea. She took one dose of 300 mg on 10-29-15 with nausea and vomiting. [place] dropped to 1.1 on 11-05-15, but recovered on brief treatment break. She tolerated the Lynparza at 100 mg bid x 5 doses from 4-26 thru 11-16-15 using antiemetic premedication. By 4-28 she was again very uncomfortable from ascites, stopping the [place] and taking almost nothing po. Dose was decreased further to 100 mg bid tried 5-3 thru 11-23-15, with marked fatigue and poor po intake. Situation has been complicated by rapidly recurring large volume ascites, with paracentesis for 2.9 liters on 11-08-15 and 2.5 liters on 11-19-15.   Patient held olaparib since evening 11-23-15, with fatigue gradually lessening. She has been able to eat and drink somewhat better since last paracentesis, mostly V8, lemonade, water and small serving of spaghetti last pm. She has not used [employer], discussed again. She does not feel as uncomfortable from ascites again now. She had ~ 2 days of constipation, then bowels finally moved after miralax x2 yesterday. She denies pain, SOB, bleeding, vomiting. She was not as nauseated after using antiemetics to premed the olaparib doses. No fever. No LE swelling. She is voiding. No problems with PAC. Remainder of 10 point Review of Systems negative/ unchanged.   PAC flushed 10-29-15 BRCA 2 mutation ([id number]*) by [employer] 10-12-14 Flu vaccine 04-02-15 CA 125 on 04-09-15 baseline for gemzar 313, down to 27  in Jan 2017, then 66 on 10-08-15  Sister wants to take her on cruise May 18-22, to [place]  ONCOLOGIC HISTORY Left ovarian cancer: Patient became symptomatic with abdominal swelling and constipation in late Nov 2015, seen and treated initially for what was thought to be ulcer. Symptoms continued, with early satiety causing poor po intake; she was seen again at [facility] ED in early Jan 2016, with CT reportedly consistent with stage IIIC or IV gyn cancer, including omental caking, large pelvic mass, para-aortic adenopathy, ascites, pleural effusions and right subpleural nodule. She was seen in consultation by Dr Denman George on 07-25-14, with concern from her exam and scan for rectal involvement. 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 She had paracentesis of 3.4 liters ascites on 07-27-2014, with cytology ([id number]) showing serous carcinoma consistent with gyn primary, ER negative. She had right thoracentesis for 350 cc on 08-11-14, this requested by anesthesia prior to surgery and apparently not sent for cytology. Patient was more comfortable after paracentesis, but could not tell any difference in breathing after thoracentesis (denies SOB prior to that procedure). CA 125 on 08-02-14 was 1162. She was taken to exploratory laparotomy by Dr Denman George on 08-15-14, with 4 liters of ascites removed and biopsy of omentum obtained, with additional debulking not attempted due to surgical findings of entire peritoneal surface replaced by tumor plaque, small intestine coated with tumor and mesentery tethered with tumor nodules, ileum adherent to pelvic mass, dense plaque of tumor on right diaphragm and 10 cm omental cake in LUQ. Pathology of omental biopsy She needed another paracentesis by IR  for 1.3 liters ascites on 08-17-14, and had PAC placed by IR also 08-17-14. Day 1 cycle 1 dose dense carboplatin taxol was given in hospital on 08-18-14. After 2 cycles of dose  dense regimen, the carboplatin and taxol was changed to every 3 week regimen beginning cycle 3; she had cycle 5 on 11-09-14, with gCSF support. She had interval debulking by Dr Denman George on 11-28-14. Surgery was exploratory laparotomy with TAH/ BSO, omentectomy and radical tumor debulking; R1 resection with residual milial tumor on small bowel at completion of procedure. Tumor involved bilateral adnexa, uterine serosa, omentum and falciform ligament, with + LVSI, no nodes examined. Maximum tumor size was 4.3 cm involving left ovary. She resumed carboplatin Taxol on 12-21-14, total of 3 cycles thru 02-01-15 (= 3 cycles after interval debulking). Restaging CT CAP 03-12-15 had some changes concerning for residual or recurrent disease, and CA 125 was elevated to 64 on 03-14-15, having been 6 in July 2016.  Review of prior radiation and chemotherapy (for left breast cancer) significant for use of epirubicin and some unquantified cardiac exposure to radiation. She began gemcitabine 04-09-15. She was neutropenic by 04-16-15. Regimen was changed to every other week gemzar with neulasta support, gemzar dose also decreased beginning cycle 2. CA 125 on 04-09-15 baseline for gemzar 313, down to 27 in Jan 2017, then 35 in Feb and 39 early Jan 2017. She continued gemzar thru 09-24-15. BRCA 2 abnormality identified 10-12-14. Patient took Lynparza at 400 mg bid from 4-6 thru 10-28-15, with diarrhea. She took one dose of 300 mg on 10-29-15 with nausea and vomiting. [place] dropped to 1.1 on 11-05-15, but recovered on brief treatment break. She tolerated the Lynparza at 100 mg bid x 5 doses from 4-26 thru 11-16-15 using antiemetic premedication. By 4-28 she was again very uncomfortable from ascites, stopping the [place] and taking almost nothing po. Dose was decreased further to 100 mg bid tried 5-3 thru 11-23-15, with marked fatigue and poor po intake. Situation has been complicated by rapidly recurring large volume ascites, with paracentesis for 2.9  liters on 11-08-15 and 2.5 liters on 11-19-15.      Breast cancer: Left breast biopsy 11-22-2003 ([id number]) invasive carcinoma. Left breast wide excision with sentinel nodes and axillary contents by Dr Benson [place] 01-03-2004 [phone]) poorly differentiated invasive ductal carcinoma 3.0 cm with closest margin 1.7 cm deep, 6 sentinel nodes + 3 additional nodes all negative, ER 0, PR 0, HER 2 3+, Ki67 of 19%. . She received adjuvant chemotherapy by Dr Sonny Dandy with 5FU, epirubicin, cytoxan x 4 cycles from 02-13-2004 thru 04-16-2004, followed by taxol 80 mg/m2 weekly x 11 from 05-07-2004 thru 09-04-2004, and herceptin 05-21-2004 thru 02-25-2005. Antiemetics were Kytril and decadron. She had PAC for the chemotherapy. She had radiation by Dr Tyler Pita in [place]. Last note from Dr Sonny Dandy in this information was 04-2008. Left diagnostic mammogram [facility] 10-25-2014 stable compared with 12-06-13 and priors; bilateral at [facility] 02-13-15. Breast MRI 05-25-15 had no evidence of malignancy there, this medically necessary with BRCA 2 mutation.   Objective:  Vital signs in last 24 hours:  BP 111/84 mmHg  Pulse 95  Temp(Src) 98.4 F (36.9 C) (Oral)  Resp 18  Ht 5' 2"  (1.575 m)  Wt 127 lb 6.4 oz (57.788 kg)  BMI 23.30 kg/m2  SpO2 96% Weight down 4 lbsAlert, oriented and appropriate. Ambulatory without assistance.  Respirations not labored RA. Does not look uncomfortable.  HEENT:PERRL, sclerae not icteric. Oral mucosa moist without lesions, posterior pharynx  clear.  Neck supple. No JVD.  Lymphatics:no supraclavicular adenopathy Resp: clear to auscultation bilaterally and normal percussion bilaterally Cardio: regular rate and rhythm. No gallop. GI: soft, nontender, clearly distended tho not as tight as at last exam prior to last paracentesis. Cannot appreciate mass or organomegaly. Few bowel sounds. Surgical incision not remarkable. Musculoskeletal/ Extremities: without pitting edema, cords, tenderness Neuro:  no peripheral neuropathy. Otherwise nonfocal. PSYCH appropriate mood and affect Skin without rash, ecchymosis, petechiae Breasts: thickening left breast superiorly unchanged Portacath-without erythema or tenderness  Lab Results:  Results for orders placed or performed in visit on 11/26/15  CBC with Differential  Result Value Ref Range   WBC 3.8 (L) 3.9 - 10.3 10e3/uL   NEUT# 1.9 1.5 - 6.5 10e3/uL   HGB 11.7 11.6 - 15.9 g/dL   HCT 36.0 34.8 - 46.6 %   Platelets 272 145 - 400 10e3/uL   MCV 92.1 79.5 - 101.0 fL   MCH 29.9 25.1 - 34.0 pg   MCHC 32.5 31.5 - 36.0 g/dL   RBC 3.91 3.70 - 5.45 10e6/uL   RDW 14.1 11.2 - 14.5 %   lymph# 1.4 0.9 - 3.3 10e3/uL   MONO# 0.6 0.1 - 0.9 10e3/uL   Eosinophils Absolute 0.0 0.0 - 0.5 10e3/uL   Basophils Absolute 0.0 0.0 - 0.1 10e3/uL   NEUT% 48.9 38.4 - 76.8 %   LYMPH% 35.2 14.0 - 49.7 %   MONO% 15.1 (H) 0.0 - 14.0 %   EOS% 0.5 0.0 - 7.0 %   BASO% 0.3 0.0 - 2.0 %   nRBC 0 0 - 0 %  Comprehensive metabolic panel  Result Value Ref Range   Sodium 137 136 - 145 mEq/L   Potassium 3.5 3.5 - 5.1 mEq/L   Chloride 101 98 - 109 mEq/L   CO2 27 22 - 29 mEq/L   Glucose 105 70 - 140 mg/dl   BUN 11.8 7.0 - 26.0 mg/dL   Creatinine 0.8 0.6 - 1.1 mg/dL   Total Bilirubin 0.35 0.20 - 1.20 mg/dL   Alkaline Phosphatase 58 40 - 150 U/L   AST 12 5 - 34 U/L   ALT <9 0 - 55 U/L   Total Protein 6.5 6.4 - 8.3 g/dL   Albumin 3.3 (L) 3.5 - 5.0 g/dL   Calcium 8.7 8.4 - 10.4 mg/dL   Anion Gap 9 3 - 11 mEq/L   EGFR 87 (L) >90 ml/min/1.73 m2  CA 125  Result Value Ref Range   Cancer Antigen (CA) 125 399.2 (H) 0.0 - 38.1 U/mL   CA 125 available after visit, had been 167 on 10-29-15 and 58 on 10-08-15  Studies/Results:  No results found.  Medications: I have reviewed the patient's current medications.  DISCUSSION Will try one additional dose reduction on the Lynparza, to 100 mg qhs, tho this is below any reductions that I find in the drug information. She will take  antiemetics prior to each dose. If she cannot tolerate this dose, will need to change therapy, which is unfortunate given possible benefit of PARP inhibitor in her disease. She could have weekly topotecan, weekly topo + avastin, possibly Alimta, oral VP16. She would prefer oral if possible.  She needs to dose miralax daily depending on bowels, to keep them moving daily of possible  She does not feel that she needs another paracentesis scheduled, tho she clearly has ascites reaccumulating. She understands that she needs to give some advance notice to get paracenteses set up. I have not  discussed peritoneal catheter, but may need that if cannot get control with systemic treatment.   I have talked with her again about the importance of getting in good nutrition including protein, as she is losing large amounts of protein with the ascites. She tells me that she will start [employer] today, needs 3-4 daily now.       Assessment/Plan: . 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 Rapid recurrence/ progression of gyn cancer within 4 weeks of completing that chemo. Paracentesis confirmed serous malignancy. Responded initially to gemzar but progression by early March 2017. Olaparib begun 10-25-15 at 400 mg bid, diarrhea day 4, then NV with attempt at resuming 300 mg bid; ANC 1.1 by 11-05-15 but recovered now.Tried just 100 mg bid with antiemetic premeds, now will reduce again to 100 mg at hs. Rapid recurrence of ascites, plan as above. I will see her again in a week.  2.BRCA 2 mutation. PARP inhibitors may be very useful for disease, but unable to tolerate adequately thus far. I believe GI side effects are more pronounced with olaparib, severe fatigue not common but can occur.  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 BRCA 2+. Bilateral mammograms at [facility] done 02-13-15, breast MRI done 05-25-15, no evidence of active malignancy then. 4.PAC in, will need to flush every 6-8 weeks when not otherwise used.  5.chemo and iron deficiency anemia: hemoglobin improved, continue oral iron.  6.nutritional status: early satiety with ascites, needs to use [employer], nutritionist to assist 7.RBBB and LAFB, HTN, known to Dr Acie Fredrickson. Previous epirubicin, some cardiac exposure from left breast radiation. 8.hypothyroidism on replacement: seen by PCP last week for this. 9.post parathyroidectomy 2005 10.insurance concerns: appreciate help from [facility] staff. Medicaid application still pending. 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 We will speak with her by phone on 5-11, or sooner if needed. Time spent 30 min including >50% counseling and coordination of care. Route PCP.   Gordy Levan, MD   11/28/2015, 8:38 PM

## 2015-11-26 NOTE — Telephone Encounter (Signed)
appt made anda avs printed

## 2015-11-27 LAB — CA 125: CANCER ANTIGEN (CA) 125: 399.2 U/mL — AB (ref 0.0–38.1)

## 2015-11-28 ENCOUNTER — Telehealth: Payer: Self-pay

## 2015-11-28 DIAGNOSIS — R180 Malignant ascites: Secondary | ICD-10-CM

## 2015-11-28 DIAGNOSIS — E46 Unspecified protein-calorie malnutrition: Secondary | ICD-10-CM | POA: Insufficient documentation

## 2015-11-28 DIAGNOSIS — C569 Malignant neoplasm of unspecified ovary: Secondary | ICD-10-CM

## 2015-11-28 NOTE — Addendum Note (Signed)
Addended by: Janace Hoard on: 11/28/2015 11:36 AM   Modules accepted: Orders

## 2015-11-28 NOTE — Telephone Encounter (Signed)
Stop Lynparza.  I will figure out a different regimen Please set up therapeutic paracentesis this week  Thank you

## 2015-11-28 NOTE — Telephone Encounter (Signed)
S/w pt to stop [place] Set up paracentesis at 11 am [employer] pt with information.

## 2015-11-28 NOTE — Telephone Encounter (Signed)
Pt still not feeling any better since cutting Lynparza dose to 100 mg qHS on Monday. She is still in the bed all day, sleepy, nauseated, no appetite, she did throw up once this am.  She is also requesting a paracentesis - her abd is getting distended again.

## 2015-11-29 ENCOUNTER — Telehealth: Payer: Self-pay

## 2015-11-29 ENCOUNTER — Other Ambulatory Visit: Payer: Self-pay | Admitting: Oncology

## 2015-11-29 ENCOUNTER — Ambulatory Visit (HOSPITAL_COMMUNITY)
Admission: RE | Admit: 2015-11-29 | Discharge: 2015-11-29 | Disposition: A | Discharge status: Home / Self Care | Payer: Self-pay | Source: Ambulatory Visit | Attending: Oncology | Admitting: Oncology

## 2015-11-29 DIAGNOSIS — C569 Malignant neoplasm of unspecified ovary: Secondary | ICD-10-CM

## 2015-11-29 DIAGNOSIS — R180 Malignant ascites: Secondary | ICD-10-CM

## 2015-11-29 MED ORDER — ETOPOSIDE 50 MG PO CAPS: 50.0000 mg | ORAL_CAPSULE | Freq: Every day | ORAL | Status: DC

## 2015-11-29 NOTE — Telephone Encounter (Signed)
[place] called to let Dr. Marko Plume know that the generic  Etoposide costs $900.00.  Patient will not be able to afford this medication. LM for Montel Clock pharmacist in charge of Oral Chemotherapies. Message sent to Dr. Marko Plume as well.

## 2015-11-30 ENCOUNTER — Telehealth: Payer: Self-pay

## 2015-11-30 NOTE — Telephone Encounter (Signed)
-----   Message from Gordy Levan, MD sent at 11/29/2015  1:29 PM EDT ----- Please do teaching for weekly topotecan  Weekly x 3 every 4 weeks. I will try to start next week   Thank you

## 2015-11-30 NOTE — Telephone Encounter (Signed)
Spoke with Ms. Kathleen Bond and Reviewed the potential side-effects of the topotecan chemotherapy as highlighted in education handout to be given to patient 12-03-15. Patient appreciative of information.

## 2015-12-02 ENCOUNTER — Other Ambulatory Visit: Payer: Self-pay | Admitting: Oncology

## 2015-12-02 DIAGNOSIS — Z1509 Genetic susceptibility to other malignant neoplasm: Secondary | ICD-10-CM

## 2015-12-02 DIAGNOSIS — Z1501 Genetic susceptibility to malignant neoplasm of breast: Secondary | ICD-10-CM

## 2015-12-02 DIAGNOSIS — R180 Malignant ascites: Secondary | ICD-10-CM

## 2015-12-02 DIAGNOSIS — C561 Malignant neoplasm of right ovary: Secondary | ICD-10-CM

## 2015-12-03 ENCOUNTER — Encounter: Payer: Self-pay | Admitting: Pharmacist

## 2015-12-03 ENCOUNTER — Other Ambulatory Visit (HOSPITAL_BASED_OUTPATIENT_CLINIC_OR_DEPARTMENT_OTHER): Payer: Self-pay

## 2015-12-03 ENCOUNTER — Ambulatory Visit (HOSPITAL_BASED_OUTPATIENT_CLINIC_OR_DEPARTMENT_OTHER): Payer: Self-pay

## 2015-12-03 ENCOUNTER — Telehealth: Payer: Self-pay | Admitting: Oncology

## 2015-12-03 ENCOUNTER — Encounter: Payer: Self-pay | Admitting: Oncology

## 2015-12-03 ENCOUNTER — Ambulatory Visit (HOSPITAL_BASED_OUTPATIENT_CLINIC_OR_DEPARTMENT_OTHER): Payer: Self-pay | Admitting: Oncology

## 2015-12-03 ENCOUNTER — Ambulatory Visit: Payer: Self-pay

## 2015-12-03 VITALS — BP 101/79 | HR 109 | Temp 98.8°F | Resp 18 | Ht 62.0 in | Wt 125.0 lb

## 2015-12-03 DIAGNOSIS — Z95828 Presence of other vascular implants and grafts: Secondary | ICD-10-CM

## 2015-12-03 DIAGNOSIS — E039 Hypothyroidism, unspecified: Secondary | ICD-10-CM

## 2015-12-03 DIAGNOSIS — C569 Malignant neoplasm of unspecified ovary: Secondary | ICD-10-CM

## 2015-12-03 DIAGNOSIS — Z853 Personal history of malignant neoplasm of breast: Secondary | ICD-10-CM

## 2015-12-03 DIAGNOSIS — Z1509 Genetic susceptibility to other malignant neoplasm: Principal | ICD-10-CM

## 2015-12-03 DIAGNOSIS — C561 Malignant neoplasm of right ovary: Secondary | ICD-10-CM

## 2015-12-03 DIAGNOSIS — E46 Unspecified protein-calorie malnutrition: Secondary | ICD-10-CM

## 2015-12-03 DIAGNOSIS — Z5111 Encounter for antineoplastic chemotherapy: Secondary | ICD-10-CM

## 2015-12-03 DIAGNOSIS — C562 Malignant neoplasm of left ovary: Secondary | ICD-10-CM

## 2015-12-03 DIAGNOSIS — Z1501 Genetic susceptibility to malignant neoplasm of breast: Secondary | ICD-10-CM

## 2015-12-03 DIAGNOSIS — R180 Malignant ascites: Secondary | ICD-10-CM

## 2015-12-03 LAB — COMPREHENSIVE METABOLIC PANEL
ANION GAP: 7 meq/L (ref 3–11)
AST: 12 U/L (ref 5–34)
Albumin: 3.1 g/dL — ABNORMAL LOW (ref 3.5–5.0)
Alkaline Phosphatase: 55 U/L (ref 40–150)
BUN: 14.2 mg/dL (ref 7.0–26.0)
CHLORIDE: 103 meq/L (ref 98–109)
CO2: 27 meq/L (ref 22–29)
CREATININE: 0.9 mg/dL (ref 0.6–1.1)
Calcium: 8.6 mg/dL (ref 8.4–10.4)
EGFR: 82 mL/min/{1.73_m2} — ABNORMAL LOW (ref >90)
Glucose: 118 mg/dl (ref 70–140)
Potassium: 3.8 mEq/L (ref 3.5–5.1)
Sodium: 137 mEq/L (ref 136–145)
Total Bilirubin: <0.3 mg/dL (ref 0.20–1.20)
Total Protein: 6.2 g/dL — ABNORMAL LOW (ref 6.4–8.3)

## 2015-12-03 LAB — CBC WITH DIFFERENTIAL/PLATELET
BASO%: 0.5 % (ref 0.0–2.0)
BASOS ABS: 0 10*3/uL (ref 0.0–0.1)
EOS%: 0 % (ref 0.0–7.0)
Eosinophils Absolute: 0 10*3/uL (ref 0.0–0.5)
HCT: 35.7 % (ref 34.8–46.6)
HGB: 11.7 g/dL (ref 11.6–15.9)
LYMPH%: 31 % (ref 14.0–49.7)
MCH: 29.7 pg (ref 25.1–34.0)
MCHC: 32.8 g/dL (ref 31.5–36.0)
MCV: 90.6 fL (ref 79.5–101.0)
MONO#: 0.4 10*3/uL (ref 0.1–0.9)
MONO%: 10.3 % (ref 0.0–14.0)
NEUT#: 2.5 10*3/uL (ref 1.5–6.5)
NEUT%: 58.2 % (ref 38.4–76.8)
Platelets: 293 10*3/uL (ref 145–400)
RBC: 3.94 10*6/uL (ref 3.70–5.45)
RDW: 14.1 % (ref 11.2–14.5)
WBC: 4.3 10*3/uL (ref 3.9–10.3)
lymph#: 1.3 10*3/uL (ref 0.9–3.3)

## 2015-12-03 MED ORDER — TOPOTECAN HCL CHEMO INJECTION 4 MG
4.0000 mg/m2 | Freq: Once | INTRAVENOUS | Status: AC
  Administered 2015-12-03: 6.4 mg via INTRAVENOUS
  Filled 2015-12-03: qty 6.4

## 2015-12-03 MED ORDER — PROCHLORPERAZINE MALEATE 10 MG PO TABS
10.0000 mg | ORAL_TABLET | Freq: Once | ORAL | Status: AC
  Administered 2015-12-03: 10 mg via ORAL

## 2015-12-03 MED ORDER — SODIUM CHLORIDE 0.9% FLUSH
10.0000 mL | INTRAVENOUS | Status: DC | PRN
  Administered 2015-12-03: 10 mL
  Filled 2015-12-03: qty 10

## 2015-12-03 MED ORDER — PROCHLORPERAZINE MALEATE 10 MG PO TABS
ORAL_TABLET | ORAL | Status: AC
  Filled 2015-12-03: qty 1

## 2015-12-03 MED ORDER — PROCHLORPERAZINE MALEATE 10 MG PO TABS: 10.0000 mg | ORAL_TABLET | Freq: Four times a day (QID) | ORAL | Status: AC | PRN

## 2015-12-03 MED ORDER — LORAZEPAM 0.5 MG PO TABS: 0.5000 mg | ORAL_TABLET | Freq: Four times a day (QID) | ORAL | Status: AC | PRN

## 2015-12-03 MED ORDER — SODIUM CHLORIDE 0.9 % IV SOLN
Freq: Once | INTRAVENOUS | Status: AC
Start: 2015-12-03 — End: 2015-12-03
  Administered 2015-12-03: 13:00:00 via INTRAVENOUS

## 2015-12-03 MED ORDER — SODIUM CHLORIDE 0.9 % IJ SOLN
10.0000 mL | INTRAMUSCULAR | Status: DC | PRN
  Administered 2015-12-03: 10 mL via INTRAVENOUS
  Filled 2015-12-03: qty 10

## 2015-12-03 MED ORDER — HEPARIN SOD (PORK) LOCK FLUSH 100 UNIT/ML IV SOLN
500.0000 [IU] | Freq: Once | INTRAVENOUS | Status: AC | PRN
  Administered 2015-12-03: 500 [IU]
  Filled 2015-12-03: qty 5

## 2015-12-03 NOTE — Progress Notes (Signed)
1450- Pt tolerated first time topotecan without any complaints or concerns. Placed follow up chemo call for tomorrow. Pt education done on possible side effects and when to call if pt has any concerns. AVS printed with scheduled appointments.

## 2015-12-03 NOTE — Patient Instructions (Signed)
[facility] Discharge Instructions for Patients Receiving Chemotherapy  Today you received the following chemotherapy agents: Topotecan   To help prevent nausea and vomiting after your treatment, we encourage you to take your nausea medication: as prescribed.    If you develop nausea and vomiting that is not controlled by your nausea medication, call the clinic.   BELOW ARE SYMPTOMS THAT SHOULD BE REPORTED IMMEDIATELY:  *FEVER GREATER THAN 100.5 F  *CHILLS WITH OR WITHOUT FEVER  NAUSEA AND VOMITING THAT IS NOT CONTROLLED WITH YOUR NAUSEA MEDICATION  *UNUSUAL SHORTNESS OF BREATH  *UNUSUAL BRUISING OR BLEEDING  TENDERNESS IN MOUTH AND THROAT WITH OR WITHOUT PRESENCE OF ULCERS  *URINARY PROBLEMS  *BOWEL PROBLEMS  UNUSUAL RASH Items with * indicate a potential emergency and should be followed up as soon as possible.  Feel free to call the clinic if you have any questions or concerns. The clinic phone number is [phone].  Please show the [place] at check-in to the Emergency Department and triage nurse.

## 2015-12-03 NOTE — Patient Instructions (Signed)

## 2015-12-03 NOTE — Progress Notes (Signed)
I s/w Dr. Marko Plume re: oral Etoposide. Pt copay $900 so was starting on Topotecan IV instead. If pt should need to start on oral Etoposide, she should call [place] assistance program [phone]) and they can determine over the phone if pt qualifies for assistance and if she qualifies, they can enroll pt on the spot. Dr. Marko Plume aware and may use this if necessary down the road. Kennith Center, Pharm.D., CPP 12/03/2015@11 :16 AM

## 2015-12-03 NOTE — Progress Notes (Signed)
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  Patient is BRCA 2 +, however unfortunately was unable to tolerate olaparib even at much reduced dosing due to severe fatigue and GI side effects. She attempted the Lynparza/ olaparib from 10-25-15 thru 11-21-15, just intermittent dosing due to side effects. [place] has rucaparib samples available, which possibly would have different side effects, we have decided to try to get control of situation now with chemotherapy. She will begin topotecan weekly x 3 every 28 days today.   Patient required another paracentesis on 11-29-15, for 2.7 liters. She can tell some gradual increase in abdominal distension already, so we will request paracentesis ~ 5-19. She has tried to eat and to drink fluids, reminded her to push the [employer]. She has felt generally more weak, mostly in bed last 2 days. Bowels are moving ~ every other day. She has some dull pain upper abdomen to left, using just occasional tylenol. She had some nausea today and is thirsty. She denies SOB, bleeding. LE swelling, problems with PAC.  Remainder of 10 point Review of Systems negative/ unchanged.   PAC flushed 10-29-15 BRCA 2 mutation (p.[id number]*) by [employer] 10-12-14 Flu vaccine 04-02-15 CA 125 on 11-26-15     399  She will not go on the cruise that sister had planned next week.    ONCOLOGIC HISTORY Left ovarian cancer: Patient became symptomatic with abdominal swelling and constipation in late Nov 2015, seen and treated initially for what was thought to be ulcer. Symptoms continued, with early satiety causing poor po intake; she was seen again at [facility] ED in early Jan 2016, with CT reportedly consistent with stage IIIC  or IV gyn cancer, including omental caking, large pelvic mass, para-aortic adenopathy, ascites, pleural effusions and right subpleural nodule. She was seen in consultation by Dr Denman George on 07-25-14, with concern from her exam and scan for rectal involvement. 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 She had paracentesis of 3.4 liters ascites on 07-27-2014, with cytology ([id number]) showing serous carcinoma consistent with gyn primary, ER negative. She had right thoracentesis for 350 cc on 08-11-14, this requested by anesthesia prior to surgery and apparently not sent for cytology. Patient was more comfortable after paracentesis, but could not tell any difference in breathing after thoracentesis (denies SOB prior to that procedure). CA 125 on 08-02-14 was 1162. She was taken to exploratory laparotomy by Dr Denman George on 08-15-14, with 4 liters of ascites removed and biopsy of omentum obtained, with additional debulking not attempted due to surgical findings of entire peritoneal surface replaced by tumor plaque, small intestine coated with tumor and mesentery tethered with tumor nodules, ileum adherent to pelvic mass, dense plaque of tumor on right diaphragm and 10 cm omental cake in LUQ. Pathology of omental biopsy She needed another paracentesis by IR for 1.3 liters ascites on 08-17-14, and had PAC placed by IR also 08-17-14. Day 1 cycle 1 dose dense carboplatin taxol was given in hospital on 08-18-14. After 2 cycles of dose dense regimen, the carboplatin and taxol was changed to every 3 week regimen beginning cycle 3; she had cycle 5 on 11-09-14, with gCSF support. She had interval debulking by Dr Denman George on 11-28-14.  Surgery was exploratory laparotomy with TAH/ BSO, omentectomy and radical tumor debulking; R1 resection with residual milial tumor on small bowel at completion of procedure. Tumor involved bilateral adnexa, uterine serosa, omentum and falciform  ligament, with + LVSI, no nodes examined. Maximum tumor size was 4.3 cm involving left ovary. She resumed carboplatin Taxol on 12-21-14, total of 3 cycles thru 02-01-15 (= 3 cycles after interval debulking). Restaging CT CAP 03-12-15 had some changes concerning for residual or recurrent disease, and CA 125 was elevated to 64 on 03-14-15, having been 6 in July 2016.  Review of prior radiation and chemotherapy (for left breast cancer) significant for use of epirubicin and some unquantified cardiac exposure to radiation. She began gemcitabine 04-09-15. She was neutropenic by 04-16-15. Regimen was changed to every other week gemzar with neulasta support, gemzar dose also decreased beginning cycle 2. CA 125 on 04-09-15 baseline for gemzar 313, down to 27 in Jan 2017, then 35 in Feb and 39 early Jan 2017. She continued gemzar thru 09-24-15. BRCA 2 abnormality identified 10-12-14. Patient took Lynparza at 400 mg bid from 4-6 thru 10-28-15, with diarrhea. She took one dose of 300 mg on 10-29-15 with nausea and vomiting. Loop dropped to 1.1 on 11-05-15, but recovered on brief treatment break. She tolerated the Lynparza at 100 mg bid x 5 doses from 4-26 thru 11-16-15 using antiemetic premedication. By 4-28 she was again very uncomfortable from ascites, stopping the [place] and taking almost nothing po. Dose was decreased further to 100 mg bid tried 5-3 thru 11-23-15, with marked fatigue and poor po intake. Situation has been complicated by rapidly recurring large volume ascites, with paracentesis for 2.9 liters on 11-08-15,  2.5 liters on 11-19-15 and 2.7 liters on 11-29-15.   Breast cancer: Left breast biopsy 11-22-2003 ([id number]) invasive carcinoma. Left breast wide excision with sentinel nodes and axillary contents by Dr Benson Norway 01-03-2004 [phone]) poorly differentiated invasive ductal carcinoma 3.0 cm with closest margin 1.7 cm deep, 6 sentinel nodes + 3 additional nodes all negative, ER 0, PR 0, HER 2 3+, Ki67 of 19%. .  She received adjuvant chemotherapy by Dr Sonny Dandy with 5FU, epirubicin, cytoxan x 4 cycles from 02-13-2004 thru 04-16-2004, followed by taxol 80 mg/m2 weekly x 11 from 05-07-2004 thru 09-04-2004, and herceptin 05-21-2004 thru 02-25-2005. Antiemetics were Kytril and decadron. She had PAC for the chemotherapy. She had radiation by Dr Tyler Pita in [place]. Last note from Dr Sonny Dandy in this information was 04-2008. Left diagnostic mammogram [facility] 10-25-2014 stable compared with 12-06-13 and priors; bilateral at [facility] 02-13-15. Breast MRI 05-25-15 had no evidence of malignancy there, this medically necessary with BRCA 2 mutation.    Objective:  Vital signs in last 24 hours:  BP 101/79 mmHg  Pulse 109  Temp(Src) 98.8 F (37.1 C) (Oral)  Resp 18  Ht 5' 2"  (1.575 m)  Wt 125 lb (56.7 kg)  BMI 22.86 kg/m2  SpO2 98% Weight down 2 lbs post paracentesis 11-29-15 Alert, oriented and appropriate. Ambulatory without assistance. Looks somewhat tired but not in acute discomfort. Respirations not labored RA. Alopecia  HEENT:PERRL, sclerae not icteric. Oral mucosa moist without lesions, posterior pharynx clear.  Neck supple. No JVD.  Lymphatics:no cervical,supraclavicular or inguinal adenopathy Resp: clear to auscultation bilaterally and normal percussion bilaterally Cardio: tachy, regular rate and rhythm. No gallop. GI: soft, nontender, some distension but not tight. A few bowel sounds.  Musculoskeletal/ Extremities: UE/ LE without pitting edema, cords, tenderness Neuro:stable mild peripheral neuropathy. Otherwise nonfocal. PSYCH  appropriate mood and affect Skin without rash, ecchymosis, petechiae Portacath-without erythema or tenderness  Lab Results:  Results for orders placed or performed in visit on 12/03/15  CBC with Differential  Result Value Ref Range   WBC 4.3 3.9 - 10.3 10e3/uL   NEUT# 2.5 1.5 - 6.5 10e3/uL   HGB 11.7 11.6 - 15.9 g/dL   HCT 35.7 34.8 - 46.6 %   Platelets 293 145 - 400 10e3/uL    MCV 90.6 79.5 - 101.0 fL   MCH 29.7 25.1 - 34.0 pg   MCHC 32.8 31.5 - 36.0 g/dL   RBC 3.94 3.70 - 5.45 10e6/uL   RDW 14.1 11.2 - 14.5 %   lymph# 1.3 0.9 - 3.3 10e3/uL   MONO# 0.4 0.1 - 0.9 10e3/uL   Eosinophils Absolute 0.0 0.0 - 0.5 10e3/uL   Basophils Absolute 0.0 0.0 - 0.1 10e3/uL   NEUT% 58.2 38.4 - 76.8 %   LYMPH% 31.0 14.0 - 49.7 %   MONO% 10.3 0.0 - 14.0 %   EOS% 0.0 0.0 - 7.0 %   BASO% 0.5 0.0 - 2.0 %  Comprehensive metabolic panel  Result Value Ref Range   Sodium 137 136 - 145 mEq/L   Potassium 3.8 3.5 - 5.1 mEq/L   Chloride 103 98 - 109 mEq/L   CO2 27 22 - 29 mEq/L   Glucose 118 70 - 140 mg/dl   BUN 14.2 7.0 - 26.0 mg/dL   Creatinine 0.9 0.6 - 1.1 mg/dL   Total Bilirubin <0.30 0.20 - 1.20 mg/dL   Alkaline Phosphatase 55 40 - 150 U/L   AST 12 5 - 34 U/L   ALT <9 0 - 55 U/L   Total Protein 6.2 (L) 6.4 - 8.3 g/dL   Albumin 3.1 (L) 3.5 - 5.0 g/dL   Calcium 8.6 8.4 - 10.4 mg/dL   Anion Gap 7 3 - 11 mEq/L   EGFR 82 (L) >90 ml/min/1.73 m2    CA125 on 11-26-15 was 399, having been 167 on 10-29-15 (Lynparza begun 10-25-15)  Studies/Results:  No results found.  Medications: I have reviewed the patient's current medications. Not to use Lynparza further  DISCUSSION Topotecan teaching done by RN prior to this visit, reviewed with patient. Will treat weekly x3 every 28 days if tolerates by blood counts and other side effects. I will see her again 5-22 with day 8 cycle 1 topotecan. Have added extra IVF to treatment today due to poor po intake and HR 109. May need paracentesis by end of week or early next week so will try to have that scheduled, can cancel if not needed. If unable to control disease quickly with topotecan, may need to consider peritoneal drain (tho no insurance now to easily allow assistance with this at home).  Again discussed importance of best nutrition she can manage, patient aware of protein loss with malignant ascites.  If systemic treatment stops would be  Hospice appropriate.  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 Rapid recurrence/ progression of gyn cancer within 4 weeks of completing that chemo. Paracentesis confirmed serous malignancy. Responded initially to gemzar but progression by early March 2017. Olaparib begun 10-25-15 at 482 mg bid, complicated by marked fatigue and GI side effects, unable to tolerate even at much reduced dosing. Rapid recurrence of ascites. Change therapy to topotecan  day 1 cycle 1 today, plan weekly x3 every 28 days.I will see her again in a week. Note has needed gCSF with prior regimens. Could consider addition of avastin if tolerates topotecan. 2.BRCA 2 mutation. PARP inhibitor olaparib not tolerated. As similar side effects reported with rucaparib, and with pace of disease, have decided to try back on chemotherapy now.  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 BRCA 2+. Bilateral mammograms at [facility] done 02-13-15, breast MRI done 05-25-15, no evidence of active malignancy then. 4.PAC in 5.chemo and iron deficiency anemia:  continue oral iron.  6.nutritional status: early satiety with ascites, needs to use [employer] etc, request nutritionist to assist 7.RBBB and LAFB, HTN, known to Dr Acie Fredrickson. Previous epirubicin, some cardiac exposure from left breast radiation. 8.hypothyroidism on replacement by PCP, recently seen 9.post parathyroidectomy 2005 10.insurance concerns: Medicaid application still pending per EMR 11.Flu vaccine 04-02-15 12.taxol peripheral neuropathy somewhat improved, continues gabapentin prn at hs 13.advance directives in place 33.chemo neutropenia with gemzar, required neulasta   All questions answered  and patient has given verbal consent for the topotecan. She knows to call if needed prior to next scheduled appointment. Note she lives in [place]. I have previously offered to transfer care closer to her home, however she has preferred to come to [place] so far. Chemo orders confirmed. Time spent 30 min including >50% counseling and coordination of care.  Route PCP, cc Dr Denman George for update    Gordy Levan, MD   12/04/2015, 4:46 PM

## 2015-12-03 NOTE — Telephone Encounter (Signed)
appt made and avs printed °

## 2015-12-04 ENCOUNTER — Telehealth: Payer: Self-pay

## 2015-12-04 NOTE — Telephone Encounter (Signed)
Phone line continues to ring busy.  Attempted son's cell  numberbut person hung up as noted in contact section of phone note.

## 2015-12-04 NOTE — Telephone Encounter (Signed)
Reached Kathleen Bond.  She is not nauseous.  She is drinking fluids . Eating a small amount.  She is tired and resting in bed today. She know to call if she has any questions or concerns. Kathleen Bond may have had the phone off the hook by accident. Pt asked if this nurse called her son.  Stated that this nurse did out of concern that she could not be reached by telephon.  Gald that she is ok.

## 2015-12-04 NOTE — Telephone Encounter (Signed)
-----   Message from [place], RN sent at 12/03/2015  2:02 PM EDT ----- Regarding: Dr. Marko Plume. fu call 1st time topotecan Dr. Marko Plume. fu call 1st time topotecan

## 2015-12-04 NOTE — Telephone Encounter (Signed)
Called twice and line is busy.  Will try later to reach patient.

## 2015-12-07 ENCOUNTER — Ambulatory Visit (HOSPITAL_COMMUNITY)
Admission: RE | Admit: 2015-12-07 | Discharge: 2015-12-07 | Disposition: A | Discharge status: Home / Self Care | Payer: Self-pay | Source: Ambulatory Visit | Attending: Oncology | Admitting: Oncology

## 2015-12-07 ENCOUNTER — Encounter: Payer: Self-pay | Admitting: Oncology

## 2015-12-07 DIAGNOSIS — R188 Other ascites: Secondary | ICD-10-CM | POA: Insufficient documentation

## 2015-12-07 DIAGNOSIS — C569 Malignant neoplasm of unspecified ovary: Secondary | ICD-10-CM | POA: Insufficient documentation

## 2015-12-07 NOTE — Progress Notes (Signed)
Fax sent 09/11/15 I sent to medical records

## 2015-12-07 NOTE — Procedures (Signed)
Successful US guided paracentesis from RLQ.  Yielded 2.4L of blood-tinged fluid.  No immediate complications.  Pt tolerated well.   Specimen was not sent for labs.  Ascencion Dike PA-C 12/07/2015 11:45 AM

## 2015-12-09 ENCOUNTER — Other Ambulatory Visit: Payer: Self-pay | Admitting: Oncology

## 2015-12-10 ENCOUNTER — Ambulatory Visit: Payer: Self-pay

## 2015-12-10 ENCOUNTER — Ambulatory Visit: Payer: Self-pay | Admitting: Nutrition

## 2015-12-10 ENCOUNTER — Ambulatory Visit (HOSPITAL_BASED_OUTPATIENT_CLINIC_OR_DEPARTMENT_OTHER): Payer: Self-pay | Admitting: Oncology

## 2015-12-10 ENCOUNTER — Encounter: Payer: Self-pay | Admitting: Oncology

## 2015-12-10 ENCOUNTER — Ambulatory Visit (HOSPITAL_BASED_OUTPATIENT_CLINIC_OR_DEPARTMENT_OTHER): Payer: Self-pay

## 2015-12-10 ENCOUNTER — Other Ambulatory Visit: Payer: Self-pay

## 2015-12-10 VITALS — BP 117/86 | HR 93 | Temp 98.0°F | Resp 18 | Ht 62.0 in | Wt 122.0 lb

## 2015-12-10 DIAGNOSIS — C569 Malignant neoplasm of unspecified ovary: Secondary | ICD-10-CM

## 2015-12-10 DIAGNOSIS — C561 Malignant neoplasm of right ovary: Secondary | ICD-10-CM

## 2015-12-10 DIAGNOSIS — E46 Unspecified protein-calorie malnutrition: Secondary | ICD-10-CM

## 2015-12-10 DIAGNOSIS — C562 Malignant neoplasm of left ovary: Secondary | ICD-10-CM

## 2015-12-10 DIAGNOSIS — Z1501 Genetic susceptibility to malignant neoplasm of breast: Principal | ICD-10-CM

## 2015-12-10 DIAGNOSIS — Z1509 Genetic susceptibility to other malignant neoplasm: Principal | ICD-10-CM

## 2015-12-10 DIAGNOSIS — R180 Malignant ascites: Secondary | ICD-10-CM

## 2015-12-10 DIAGNOSIS — Z95828 Presence of other vascular implants and grafts: Secondary | ICD-10-CM

## 2015-12-10 DIAGNOSIS — Z5111 Encounter for antineoplastic chemotherapy: Secondary | ICD-10-CM

## 2015-12-10 DIAGNOSIS — Z79899 Other long term (current) drug therapy: Secondary | ICD-10-CM

## 2015-12-10 DIAGNOSIS — E039 Hypothyroidism, unspecified: Secondary | ICD-10-CM

## 2015-12-10 DIAGNOSIS — Z853 Personal history of malignant neoplasm of breast: Secondary | ICD-10-CM

## 2015-12-10 LAB — CBC WITH DIFFERENTIAL/PLATELET
BASO%: 0.3 % (ref 0.0–2.0)
Basophils Absolute: 0 10*3/uL (ref 0.0–0.1)
EOS ABS: 0 10*3/uL (ref 0.0–0.5)
EOS%: 0 % (ref 0.0–7.0)
HCT: 33.1 % — ABNORMAL LOW (ref 34.8–46.6)
HGB: 11 g/dL — ABNORMAL LOW (ref 11.6–15.9)
LYMPH%: 35.2 % (ref 14.0–49.7)
MCH: 29.7 pg (ref 25.1–34.0)
MCHC: 33.2 g/dL (ref 31.5–36.0)
MCV: 89.5 fL (ref 79.5–101.0)
MONO#: 0.1 10*3/uL (ref 0.1–0.9)
MONO%: 4.4 % (ref 0.0–14.0)
NEUT%: 60.1 % (ref 38.4–76.8)
NEUTROS ABS: 1.9 10*3/uL (ref 1.5–6.5)
NRBC: 0 % (ref 0–0)
PLATELETS: 147 10*3/uL (ref 145–400)
RBC: 3.7 10*6/uL (ref 3.70–5.45)
RDW: 13.6 % (ref 11.2–14.5)
WBC: 3.2 10*3/uL — AB (ref 3.9–10.3)
lymph#: 1.1 10*3/uL (ref 0.9–3.3)

## 2015-12-10 LAB — COMPREHENSIVE METABOLIC PANEL
ANION GAP: 11 meq/L (ref 3–11)
AST: 14 U/L (ref 5–34)
Albumin: 3.1 g/dL — ABNORMAL LOW (ref 3.5–5.0)
Alkaline Phosphatase: 52 U/L (ref 40–150)
BUN: 13.8 mg/dL (ref 7.0–26.0)
CALCIUM: 8.9 mg/dL (ref 8.4–10.4)
CHLORIDE: 100 meq/L (ref 98–109)
CO2: 26 meq/L (ref 22–29)
CREATININE: 0.9 mg/dL (ref 0.6–1.1)
EGFR: 75 mL/min/{1.73_m2} — AB (ref >90)
Glucose: 119 mg/dl (ref 70–140)
POTASSIUM: 3.6 meq/L (ref 3.5–5.1)
Sodium: 137 mEq/L (ref 136–145)
Total Bilirubin: <0.3 mg/dL (ref 0.20–1.20)
Total Protein: 6.3 g/dL — ABNORMAL LOW (ref 6.4–8.3)

## 2015-12-10 MED ORDER — SODIUM CHLORIDE 0.9 % IV SOLN
Freq: Once | INTRAVENOUS | Status: AC
  Administered 2015-12-10: 14:00:00 via INTRAVENOUS

## 2015-12-10 MED ORDER — SODIUM CHLORIDE 0.9 % IJ SOLN
10.0000 mL | INTRAMUSCULAR | Status: DC | PRN
  Administered 2015-12-10: 10 mL via INTRAVENOUS
  Filled 2015-12-10: qty 10

## 2015-12-10 MED ORDER — PROCHLORPERAZINE MALEATE 10 MG PO TABS
10.0000 mg | ORAL_TABLET | Freq: Once | ORAL | Status: AC
  Administered 2015-12-10: 10 mg via ORAL

## 2015-12-10 MED ORDER — PROCHLORPERAZINE MALEATE 10 MG PO TABS
ORAL_TABLET | ORAL | Status: AC
  Filled 2015-12-10: qty 1

## 2015-12-10 MED ORDER — SODIUM CHLORIDE 0.9% FLUSH
10.0000 mL | INTRAVENOUS | Status: DC | PRN
  Administered 2015-12-10: 10 mL
  Filled 2015-12-10: qty 10

## 2015-12-10 MED ORDER — ACETAMINOPHEN 325 MG PO TABS
ORAL_TABLET | ORAL | Status: AC
  Filled 2015-12-10: qty 2

## 2015-12-10 MED ORDER — HEPARIN SOD (PORK) LOCK FLUSH 100 UNIT/ML IV SOLN
500.0000 [IU] | Freq: Once | INTRAVENOUS | Status: AC | PRN
  Administered 2015-12-10: 500 [IU]
  Filled 2015-12-10: qty 5

## 2015-12-10 MED ORDER — SODIUM CHLORIDE 0.9 % IV SOLN
3.8000 mg/m2 | Freq: Once | INTRAVENOUS | Status: AC
  Administered 2015-12-10: 6 mg via INTRAVENOUS
  Filled 2015-12-10: qty 6

## 2015-12-10 MED ORDER — ACETAMINOPHEN 325 MG PO TABS
650.0000 mg | ORAL_TABLET | Freq: Once | ORAL | Status: AC
  Administered 2015-12-10: 650 mg via ORAL

## 2015-12-10 MED ORDER — MEGESTROL ACETATE 40 MG PO TABS: 80.0000 mg | ORAL_TABLET | Freq: Two times a day (BID) | ORAL | Status: AC

## 2015-12-10 NOTE — Patient Instructions (Signed)

## 2015-12-10 NOTE — Progress Notes (Signed)
Patient nauseated during chemo treatment. I episode of emesis after which she felt better.  Patient stated that the nausea was better at infusion end. PRN nausea meds reviewed with patient.

## 2015-12-10 NOTE — Progress Notes (Signed)
Brief nutrition follow-up completed with patient during infusion for ovarian cancer. Weight documented 122 pounds on May 22 is decreased overall Patient had a paracentesis on May 11, where 2.7 L fluid was removed. Patient reports her appetite has decreased. M.D. told her to try to eat more protein. Patient tolerating Carn[employer] Instant Breakfast at least one drink daily.  Nutrition diagnosis:  Food and nutrition related knowledge deficit related to ovarian cancer as evidenced by no prior need for nutrition information.  Intervention:  Patient educated on high-protein foods and encouraged her to consume them 6 times daily. Provided [employer] breakfast essentials coupons. Reviewed high protein diet with patient and provided fact sheet. Encouraged frequent meals and snacks to improve appetite. Questions were answered.  Teach back method used.  Monitoring, evaluation, goals: Patient will tolerate adequate calories and protein to minimize weight loss.  Next visit Monday, June 12, during infusion.  **Disclaimer: This note was dictated with voice recognition software. Similar sounding words can inadvertently be transcribed and this note may contain transcription errors which may not have been corrected upon publication of note.**

## 2015-12-10 NOTE — Progress Notes (Signed)
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 She is BRCA 2+, but  was unable to tolerate olaparib due to GI side effects and severe fatigue, such that treatment was changed to topotecan, day 1 cycle 1 given 12-03-15 (planned days 07-28-13 q 28 days).  Patient had paracentesis again on 12-07-15 for 2.4 liters, has needed this ~ weekly for past month. She tolerated that procedure much better with local lidocaine. As the ascites may improve again if she responds to topotecan just begun, we have not placed peritoneal drain as yet. Situation for peritoneal catheter is also complicated by insurance status now, as she would need some HH involvement if peritoneal catheter is done.  With upcoming long holiday weekend, we will request another paracentesis be available ~ 12-14-15.   Patient had no acute problems with first topotecan. PO intake remains poor and is almost nonexistant when ascites is symptomatic. She has had no frank nausea, no vomiting, just no taste for food (other than chocolate [employer] tastes good) and early satiety. She has eaten mostly fruit and ~ 1 [employer] daily. Appreciate [facility] nutritionist seeing her later today.  She has had aching in abdomen diffusely for past several weeks, tho I was not aware of this until today. She gets slight improvement with tylenol, has not tried tramadol tho she has that prescription available . She has had no bowel movement in 48 hours, tho bowels moved X2 on 12-08-15. She took miralax last pm and will take that again today. She remains very fatigued, in bed most of last few days,  which seems more likely disease related than from olaparib at this point (olaparib used 4-6 thru 12-01-15  intermittently and at much reduced dosing). She denies SOB, cough, bleeding, problems with PAC, changes in breasts, LE swelling.  Remainder of 10 point Review of Systems unchanged.   PAC flushed 10-29-15 BRCA 2 mutation ([id number]*) by [employer] 10-12-14 Flu vaccine 04-02-15 CA 125 on 11-26-15 399  ONCOLOGIC HISTORY Left ovarian cancer: Patient became symptomatic with abdominal swelling and constipation in late Nov 2015, seen and treated initially for what was thought to be ulcer. Symptoms continued, with early satiety causing poor po intake; she was seen again at [facility] ED in early Jan 2016, with CT reportedly consistent with stage IIIC or IV gyn cancer, including omental caking, large pelvic mass, para-aortic adenopathy, ascites, pleural effusions and right subpleural nodule. She was seen in consultation by Dr Denman George on 07-25-14, with concern from her exam and scan for rectal involvement. 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 She had paracentesis of 3.4 liters ascites on 07-27-2014, with cytology ([id number]) showing serous carcinoma consistent with gyn primary, ER negative. She had right thoracentesis for 350 cc on 08-11-14, this requested by anesthesia prior to surgery and apparently not sent for cytology. Patient was more comfortable after paracentesis, but could not tell any difference in breathing after thoracentesis (denies SOB prior to that procedure). CA 125 on 08-02-14 was 1162. She was taken to exploratory laparotomy by Dr Denman George on 08-15-14, with 4 liters of ascites removed and biopsy of omentum obtained, with additional debulking not attempted due to surgical findings of entire peritoneal surface replaced by tumor plaque, small intestine  coated with tumor and mesentery tethered with tumor nodules, ileum adherent to pelvic mass, dense plaque of tumor on right diaphragm and 10 cm omental cake in LUQ. Pathology of omental biopsy She  needed another paracentesis by IR for 1.3 liters ascites on 08-17-14, and had PAC placed by IR also 08-17-14. Day 1 cycle 1 dose dense carboplatin taxol was given in hospital on 08-18-14. After 2 cycles of dose dense regimen, the carboplatin and taxol was changed to every 3 week regimen beginning cycle 3; she had cycle 5 on 11-09-14, with gCSF support. She had interval debulking by Dr Denman George on 11-28-14. Surgery was exploratory laparotomy with TAH/ BSO, omentectomy and radical tumor debulking; R1 resection with residual milial tumor on small bowel at completion of procedure. Tumor involved bilateral adnexa, uterine serosa, omentum and falciform ligament, with + LVSI, no nodes examined. Maximum tumor size was 4.3 cm involving left ovary. She resumed carboplatin Taxol on 12-21-14, total of 3 cycles thru 02-01-15 (= 3 cycles after interval debulking). Restaging CT CAP 03-12-15 had some changes concerning for residual or recurrent disease, and CA 125 was elevated to 64 on 03-14-15, having been 6 in July 2016.  Review of prior radiation and chemotherapy (for left breast cancer) significant for use of epirubicin and some unquantified cardiac exposure to radiation. She began gemcitabine 04-09-15. She was neutropenic by 04-16-15. Regimen was changed to every other week gemzar with neulasta support, gemzar dose also decreased beginning cycle 2. CA 125 on 04-09-15 baseline for gemzar 313, down to 27 in Jan 2017, then 35 in Feb and 39 early Jan 2017. She continued gemzar thru 09-24-15. BRCA 2 abnormality identified 10-12-14. Patient took Lynparza at 400 mg bid from 4-6 thru 10-28-15, with diarrhea. She took one dose of 300 mg on 10-29-15 with nausea and vomiting. [place] dropped to 1.1 on 11-05-15, but recovered on brief treatment break. She tolerated the Lynparza at 100 mg bid x 5 doses from 4-26 thru 11-16-15 using antiemetic premedication. By 4-28 she was again very uncomfortable from ascites, stopping the [place] and taking almost nothing  po. Dose was decreased further to 100 mg bid tried 5-3 thru 11-23-15, with marked fatigue and poor po intake. Situation has been complicated by rapidly recurring large volume ascites, with paracentesis for 2.9 liters on 11-08-15, 2.5 liters on 11-19-15, 2.7 liters on 11-29-15 etc. She had day 1 cycle 1 topotecan on 12-03-15.    Breast cancer: Left breast biopsy 11-22-2003 ([id number]) invasive carcinoma. Left breast wide excision with sentinel nodes and axillary contents by Dr Benson Norway 01-03-2004 [phone]) poorly differentiated invasive ductal carcinoma 3.0 cm with closest margin 1.7 cm deep, 6 sentinel nodes + 3 additional nodes all negative, ER 0, PR 0, HER 2 3+, Ki67 of 19%. . She received adjuvant chemotherapy by Dr Sonny Dandy with 5FU, epirubicin, cytoxan x 4 cycles from 02-13-2004 thru 04-16-2004, followed by taxol 80 mg/m2 weekly x 11 from 05-07-2004 thru 09-04-2004, and herceptin 05-21-2004 thru 02-25-2005. Antiemetics were Kytril and decadron. She had PAC for the chemotherapy. She had radiation by Dr Tyler Pita in [place]. Last note from Dr Sonny Dandy in this information was 04-2008. Left diagnostic mammogram [facility] 10-25-2014 stable compared with 12-06-13 and priors; bilateral at [facility] 02-13-15. Breast MRI 05-25-15 had no evidence of malignancy there, this medically necessary with BRCA 2 mutation.   Objective:  Vital signs in last 24 hours:  BP 117/86 mmHg  Pulse 93  Temp(Src) 98 F (36.7 C) (Oral)  Resp 18  Ht 5'  2" (1.575 m)  Wt 122 lb (55.339 kg)  BMI 22.31 kg/m2  SpO2 99%  Alert, oriented and appropriate. Ambulatory without assistance difficulty.  Alopecia  HEENT:PERRL, sclerae not icteric. Oral mucosa moist without lesions, posterior pharynx clear.  Neck supple. No JVD.  Lymphatics:no cervical,suraclavicular, axillary or inguinal adenopathy Resp: clear to auscultation bilaterally and normal percussion bilaterally Cardio: regular rate and rhythm. No gallop. GI: soft, nontender, not  distended, no mass or organomegaly. Normally active bowel sounds. Surgical incision not remarkable. Musculoskeletal/ Extremities: without pitting edema, cords, tenderness Neuro: no peripheral neuropathy. Otherwise nonfocal Skin without rash, ecchymosis, petechiae Breasts: without dominant mass, skin or nipple findings. Axillae benign. Portacath-without erythema or tenderness  Lab Results:  Results for orders placed or performed in visit on 12/10/15  CBC with Differential  Result Value Ref Range   WBC 3.2 (L) 3.9 - 10.3 10e3/uL   NEUT# 1.9 1.5 - 6.5 10e3/uL   HGB 11.0 (L) 11.6 - 15.9 g/dL   HCT 33.1 (L) 34.8 - 46.6 %   Platelets 147 145 - 400 10e3/uL   MCV 89.5 79.5 - 101.0 fL   MCH 29.7 25.1 - 34.0 pg   MCHC 33.2 31.5 - 36.0 g/dL   RBC 3.70 3.70 - 5.45 10e6/uL   RDW 13.6 11.2 - 14.5 %   lymph# 1.1 0.9 - 3.3 10e3/uL   MONO# 0.1 0.1 - 0.9 10e3/uL   Eosinophils Absolute 0.0 0.0 - 0.5 10e3/uL   Basophils Absolute 0.0 0.0 - 0.1 10e3/uL   NEUT% 60.1 38.4 - 76.8 %   LYMPH% 35.2 14.0 - 49.7 %   MONO% 4.4 0.0 - 14.0 %   EOS% 0.0 0.0 - 7.0 %   BASO% 0.3 0.0 - 2.0 %   nRBC 0 0 - 0 %  Comprehensive metabolic panel  Result Value Ref Range   Sodium 137 136 - 145 mEq/L   Potassium 3.6 3.5 - 5.1 mEq/L   Chloride 100 98 - 109 mEq/L   CO2 26 22 - 29 mEq/L   Glucose 119 70 - 140 mg/dl   BUN 13.8 7.0 - 26.0 mg/dL   Creatinine 0.9 0.6 - 1.1 mg/dL   Total Bilirubin <0.30 0.20 - 1.20 mg/dL   Alkaline Phosphatase 52 40 - 150 U/L   AST 14 5 - 34 U/L   ALT <9 0 - 55 U/L   Total Protein 6.3 (L) 6.4 - 8.3 g/dL   Albumin 3.1 (L) 3.5 - 5.0 g/dL   Calcium 8.9 8.4 - 10.4 mg/dL   Anion Gap 11 3 - 11 mEq/L   EGFR 75 (L) >90 ml/min/1.73 m2  CA 125 on 11-26-15 was 399, this having been 167 at start of [place] early April and 58 on 10-08-15.  Studies/Results:  EXAM: ULTRASOUND GUIDED RIGHT LOWER QUADRANT PARACENTESIS  MEDICATIONS: None.  COMPLICATIONS: None immediate.  PROCEDURE: Informed  written consent was obtained from the patient after a discussion of the risks, benefits and alternatives to treatment. A timeout was performed prior to the initiation of the procedure.  Initial ultrasound scanning demonstrates a large amount of ascites within the right lower abdominal quadrant. The right lower abdomen was prepped and draped in the usual sterile fashion. 1% lidocaine with epinephrine was used for local anesthesia.  Following this, a 7 cm, 19 gauge, Yueh catheter was introduced. An ultrasound image was saved for documentation purposes. The paracentesis was performed. The catheter was removed and a dressing was applied. The patient tolerated the procedure well without immediate post  procedural complication.  FINDINGS: A total of approximately 2.4 L of blood-tinged fluid was removed.  IMPRESSION: Successful ultrasound-guided paracentesis yielding 2.4 liters of peritoneal fluid.   Medications: I have reviewed the patient's current medications. Add Megace 80 mg bid for appetite. Fine to try the tramadol for abdominal pain. Miralax today and 1-2x daily as needed to keep bowels moving daily.   DISCUSSION: Interval history reviewed. I have told patient that fatigue may be more from disease than olaparib now.  Reviewed interventions available for the malignant ascites. She agrees with prn paracenteses for now.  Again encouraged her to try small amounts of highly nutritious food/ supplements regularly. [facility] nutritionist to see today. At patient's request, will try adding Megace for appetite. She understands that megace can further increase risk of blood clots and that she should try to remain active. May need granix depending on counts in next 1-2 weeks.  Patient lives in [place], initially came to [facility] with the gyn malignancy, previously followed by Dr Sonny Dandy in [place] for left breast cancer. I have again offered transfer of her care to Drs Neijstrom/ Darovsky in [place]  or to Dr Whitney Muse at [facility] if those would be more convenient for her, tho she understands that I am certainly glad to continue to care for her in [place] if she prefers.     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 Rapid recurrence/ progression of gyn cancer within 4 weeks of completing that chemo. Paracentesis confirmed serous malignancy. Responded initially to gemzar but progression by early March 2017. Olaparib begun 10-25-15 at 226 mg bid, complicated by marked fatigue and GI side effects, unable to tolerate even at much reduced dosing thru 12-01-15. Rapid recurrence of ascites. Change therapy to topotecan day 1 cycle 1 on 12-03-15, plan weekly x3 every 28 days, to receive day 8 cycle 1 today..I will see her again at least 12-24-15. Note has needed gCSF with prior regimens. Could consider addition of avastin if tolerates topotecan. 2.BRCA 2 mutation. PARP inhibitor olaparib not tolerated. As similar side effects reported with rucaparib, and with pace of disease, have decided to try back on chemotherapy now.  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 BRCA 2+. Bilateral mammograms at [facility] done 02-13-15, breast MRI done 05-25-15, no evidence of active malignancy then. 4.PAC in 5.chemo and iron deficiency anemia: continue oral iron.  6.Protein calorie malnutrition: early satiety with ascites, suboptimal nutrition choices, needs to use [employer] etc, nutritionist to assist 7.RBBB and LAFB, HTN, known to Dr Acie Fredrickson. Previous epirubicin, some cardiac exposure from left breast radiation. 8.hypothyroidism on replacement by PCP, recently seen 9.post parathyroidectomy 2005 10.insurance concerns: Medicaid  application still pending, [facility] managed care aware.  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 Chemo orders confirmed. Request for paracentesis ~ 5-26. Time spent 30 min including >50% counseling and coordination of care. Route PCP. Update gyn onc by this note.   LIVESAY,LENNIS P, MD   12/11/2015, 1:42 PM

## 2015-12-10 NOTE — Patient Instructions (Signed)
[facility] Discharge Instructions for Patients Receiving Chemotherapy  Today you received the following chemotherapy agents: Topotecan   To help prevent nausea and vomiting after your treatment, we encourage you to take your nausea medication: as prescribed.    If you develop nausea and vomiting that is not controlled by your nausea medication, call the clinic.   BELOW ARE SYMPTOMS THAT SHOULD BE REPORTED IMMEDIATELY:  *FEVER GREATER THAN 100.5 F  *CHILLS WITH OR WITHOUT FEVER  NAUSEA AND VOMITING THAT IS NOT CONTROLLED WITH YOUR NAUSEA MEDICATION  *UNUSUAL SHORTNESS OF BREATH  *UNUSUAL BRUISING OR BLEEDING  TENDERNESS IN MOUTH AND THROAT WITH OR WITHOUT PRESENCE OF ULCERS  *URINARY PROBLEMS  *BOWEL PROBLEMS  UNUSUAL RASH Items with * indicate a potential emergency and should be followed up as soon as possible.  Feel free to call the clinic if you have any questions or concerns. The clinic phone number is [phone].  Please show the [facility] at check-in to the Emergency Department and triage nurse.

## 2015-12-14 ENCOUNTER — Ambulatory Visit: Payer: Self-pay

## 2015-12-14 ENCOUNTER — Ambulatory Visit (HOSPITAL_COMMUNITY)
Admission: RE | Admit: 2015-12-14 | Discharge: 2015-12-14 | Disposition: A | Discharge status: Home / Self Care | Payer: Self-pay | Source: Ambulatory Visit | Attending: Oncology | Admitting: Oncology

## 2015-12-14 ENCOUNTER — Other Ambulatory Visit: Payer: Self-pay | Admitting: Oncology

## 2015-12-14 ENCOUNTER — Other Ambulatory Visit (HOSPITAL_BASED_OUTPATIENT_CLINIC_OR_DEPARTMENT_OTHER): Payer: Self-pay

## 2015-12-14 DIAGNOSIS — Z1509 Genetic susceptibility to other malignant neoplasm: Principal | ICD-10-CM

## 2015-12-14 DIAGNOSIS — C562 Malignant neoplasm of left ovary: Secondary | ICD-10-CM

## 2015-12-14 DIAGNOSIS — R180 Malignant ascites: Secondary | ICD-10-CM

## 2015-12-14 DIAGNOSIS — R188 Other ascites: Secondary | ICD-10-CM | POA: Insufficient documentation

## 2015-12-14 DIAGNOSIS — Z1501 Genetic susceptibility to malignant neoplasm of breast: Principal | ICD-10-CM

## 2015-12-14 LAB — CBC WITH DIFFERENTIAL/PLATELET
BASO%: 0.3 % (ref 0.0–2.0)
BASOS ABS: 0 10*3/uL (ref 0.0–0.1)
EOS%: 0.5 % (ref 0.0–7.0)
Eosinophils Absolute: 0 10*3/uL (ref 0.0–0.5)
HCT: 35 % (ref 34.8–46.6)
HEMOGLOBIN: 11.3 g/dL — AB (ref 11.6–15.9)
LYMPH#: 1.2 10*3/uL (ref 0.9–3.3)
LYMPH%: 44.7 % (ref 14.0–49.7)
MCH: 29.3 pg (ref 25.1–34.0)
MCHC: 32.2 g/dL (ref 31.5–36.0)
MCV: 91.2 fL (ref 79.5–101.0)
MONO#: 0 10*3/uL — AB (ref 0.1–0.9)
MONO%: 0.9 % (ref 0.0–14.0)
NEUT%: 53.6 % (ref 38.4–76.8)
NEUTROS ABS: 1.5 10*3/uL (ref 1.5–6.5)
Platelets: 116 10*3/uL — ABNORMAL LOW (ref 145–400)
RBC: 3.84 10*6/uL (ref 3.70–5.45)
RDW: 14.2 % (ref 11.2–14.5)
WBC: 2.8 10*3/uL — ABNORMAL LOW (ref 3.9–10.3)

## 2015-12-14 LAB — COMPREHENSIVE METABOLIC PANEL
ALBUMIN: 3.1 g/dL — AB (ref 3.5–5.0)
ALK PHOS: 55 U/L (ref 40–150)
AST: 15 U/L (ref 5–34)
Anion Gap: 9 mEq/L (ref 3–11)
BUN: 16.1 mg/dL (ref 7.0–26.0)
CALCIUM: 8.8 mg/dL (ref 8.4–10.4)
CO2: 27 mEq/L (ref 22–29)
CREATININE: 1 mg/dL (ref 0.6–1.1)
Chloride: 99 mEq/L (ref 98–109)
EGFR: 71 mL/min/{1.73_m2} — ABNORMAL LOW (ref >90)
GLUCOSE: 115 mg/dL (ref 70–140)
Potassium: 3.9 mEq/L (ref 3.5–5.1)
SODIUM: 136 meq/L (ref 136–145)
TOTAL PROTEIN: 6.3 g/dL — AB (ref 6.4–8.3)
Total Bilirubin: 0.34 mg/dL (ref 0.20–1.20)

## 2015-12-14 NOTE — Procedures (Signed)
Ultrasound-guided therapeutic paracentesis performed yielding 1.9 liters of blood tinged fluid. No immediate complications.

## 2015-12-14 NOTE — Progress Notes (Signed)
Injection held today per Dr. Marko Plume instructions and according to Abs neu of 1.5

## 2015-12-19 ENCOUNTER — Ambulatory Visit (HOSPITAL_COMMUNITY)
Admission: RE | Admit: 2015-12-19 | Discharge: 2015-12-19 | Disposition: A | Discharge status: Home / Self Care | Payer: Self-pay | Source: Ambulatory Visit | Attending: Oncology | Admitting: Oncology

## 2015-12-19 ENCOUNTER — Other Ambulatory Visit (HOSPITAL_BASED_OUTPATIENT_CLINIC_OR_DEPARTMENT_OTHER): Payer: Self-pay

## 2015-12-19 ENCOUNTER — Telehealth: Payer: Self-pay | Admitting: Oncology

## 2015-12-19 ENCOUNTER — Other Ambulatory Visit: Payer: Self-pay | Admitting: Oncology

## 2015-12-19 ENCOUNTER — Ambulatory Visit (HOSPITAL_BASED_OUTPATIENT_CLINIC_OR_DEPARTMENT_OTHER): Payer: Self-pay

## 2015-12-19 ENCOUNTER — Ambulatory Visit: Payer: Self-pay

## 2015-12-19 VITALS — BP 100/81 | HR 87 | Temp 97.9°F | Resp 20

## 2015-12-19 DIAGNOSIS — Z5189 Encounter for other specified aftercare: Secondary | ICD-10-CM

## 2015-12-19 DIAGNOSIS — Z1501 Genetic susceptibility to malignant neoplasm of breast: Principal | ICD-10-CM

## 2015-12-19 DIAGNOSIS — C562 Malignant neoplasm of left ovary: Secondary | ICD-10-CM

## 2015-12-19 DIAGNOSIS — T451X5A Adverse effect of antineoplastic and immunosuppressive drugs, initial encounter: Principal | ICD-10-CM

## 2015-12-19 DIAGNOSIS — C569 Malignant neoplasm of unspecified ovary: Secondary | ICD-10-CM

## 2015-12-19 DIAGNOSIS — Z1509 Genetic susceptibility to other malignant neoplasm: Principal | ICD-10-CM

## 2015-12-19 DIAGNOSIS — D6959 Other secondary thrombocytopenia: Secondary | ICD-10-CM

## 2015-12-19 DIAGNOSIS — Z95828 Presence of other vascular implants and grafts: Secondary | ICD-10-CM

## 2015-12-19 LAB — CBC WITH DIFFERENTIAL/PLATELET
BASO%: 0 % (ref 0.0–2.0)
Basophils Absolute: 0 10*3/uL (ref 0.0–0.1)
EOS ABS: 0 10*3/uL (ref 0.0–0.5)
EOS%: 0.5 % (ref 0.0–7.0)
HCT: 31.7 % — ABNORMAL LOW (ref 34.8–46.6)
HEMOGLOBIN: 10.6 g/dL — AB (ref 11.6–15.9)
LYMPH%: 67.9 % — ABNORMAL HIGH (ref 14.0–49.7)
MCH: 29.5 pg (ref 25.1–34.0)
MCHC: 33.4 g/dL (ref 31.5–36.0)
MCV: 88.3 fL (ref 79.5–101.0)
MONO#: 0 10*3/uL — ABNORMAL LOW (ref 0.1–0.9)
MONO%: 0 % (ref 0.0–14.0)
NEUT%: 31.6 % — ABNORMAL LOW (ref 38.4–76.8)
NEUTROS ABS: 0.7 10*3/uL — AB (ref 1.5–6.5)
Platelets: 35 10*3/uL — ABNORMAL LOW (ref 145–400)
RBC: 3.59 10*6/uL — ABNORMAL LOW (ref 3.70–5.45)
RDW: 13.4 % (ref 11.2–14.5)
WBC: 2.1 10*3/uL — AB (ref 3.9–10.3)
lymph#: 1.4 10*3/uL (ref 0.9–3.3)

## 2015-12-19 LAB — COMPREHENSIVE METABOLIC PANEL
ALBUMIN: 3 g/dL — AB (ref 3.5–5.0)
ALK PHOS: 55 U/L (ref 40–150)
ALT: <9 U/L (ref 0–55)
AST: 14 U/L (ref 5–34)
Anion Gap: 10 mEq/L (ref 3–11)
BILIRUBIN TOTAL: 0.31 mg/dL (ref 0.20–1.20)
BUN: 14.5 mg/dL (ref 7.0–26.0)
CO2: 25 mEq/L (ref 22–29)
CREATININE: 1 mg/dL (ref 0.6–1.1)
Calcium: 8.3 mg/dL — ABNORMAL LOW (ref 8.4–10.4)
Chloride: 100 mEq/L (ref 98–109)
EGFR: 73 mL/min/{1.73_m2} — ABNORMAL LOW (ref >90)
GLUCOSE: 141 mg/dL — AB (ref 70–140)
Potassium: 3.1 mEq/L — ABNORMAL LOW (ref 3.5–5.1)
SODIUM: 136 meq/L (ref 136–145)
TOTAL PROTEIN: 6.2 g/dL — AB (ref 6.4–8.3)

## 2015-12-19 MED ORDER — TBO-FILGRASTIM 480 MCG/0.8ML ~~LOC~~ SOSY
480.0000 ug | PREFILLED_SYRINGE | Freq: Once | SUBCUTANEOUS | Status: AC
  Administered 2015-12-19: 480 ug via SUBCUTANEOUS
  Filled 2015-12-19: qty 0.8

## 2015-12-19 MED ORDER — SODIUM CHLORIDE 0.9 % IV SOLN
250.0000 mL | Freq: Once | INTRAVENOUS | Status: AC
  Administered 2015-12-19: 250 mL via INTRAVENOUS

## 2015-12-19 MED ORDER — SODIUM CHLORIDE 0.9% FLUSH
10.0000 mL | INTRAVENOUS | Status: AC | PRN
  Administered 2015-12-19: 10 mL
  Filled 2015-12-19: qty 10

## 2015-12-19 MED ORDER — HEPARIN SOD (PORK) LOCK FLUSH 100 UNIT/ML IV SOLN
500.0000 [IU] | Freq: Every day | INTRAVENOUS | Status: AC | PRN
  Administered 2015-12-19: 500 [IU]
  Filled 2015-12-19: qty 5

## 2015-12-19 MED ORDER — SODIUM CHLORIDE 0.9 % IJ SOLN
10.0000 mL | INTRAMUSCULAR | Status: DC | PRN
  Administered 2015-12-19: 10 mL via INTRAVENOUS
  Filled 2015-12-19: qty 10

## 2015-12-19 NOTE — Patient Instructions (Signed)

## 2015-12-19 NOTE — Telephone Encounter (Signed)
Pt will get new sched in tx room °

## 2015-12-19 NOTE — Patient Instructions (Signed)
Platelet Transfusion, Care After Refer to this sheet in the next few weeks. These instructions provide you with information about caring for yourself after your procedure. Your health care provider may also give you more specific instructions. Your treatment has been planned according to current medical practices, but problems sometimes occur. Call your health care provider if you have any problems or questions after your procedure.  WHAT TO EXPECT AFTER THE PROCEDURE After your procedure, it is common to have:   Bruising and soreness at the IV site.   Fever or chills within the first 48 hours of your transfusion. HOME CARE INSTRUCTIONS  Take medicines only as directed by your health care provider. Ask your health care provider if you can take an over-the-counter pain reliever in case you have a fever or headache a day or two after your transfusion.   Return to your normal activities as directed by your health care provider.  SEEK MEDICAL CARE IF:  You have a fever.  You have a headache.  You have redness, swelling, or pain at your IV site.  You have skin itching or a rash.  You vomit.  You feel unusually tired or weak. SEEK IMMEDIATE MEDICAL CARE IF:   You have trouble breathing.  You have a decreased amount of urine or you urinate less often than you normally do.  Your urine is darker than normal.  You have pain in your back, abdomen, or chest.  You have cool, clammy skin.  You have a rapid heartbeat.   This information is not intended to replace advice given to you by your health care provider. Make sure you discuss any questions you have with your health care provider.   Document Released: 07/28/2014 Document Reviewed: 07/28/2014 Elsevier Interactive Patient Education 2016 [employer].  Neutropenia Neutropenia is a condition that occurs when the level of a certain type of white blood cell (neutrophil) in your body becomes lower than normal. Neutrophils are made  in the bone marrow and fight infections. These cells protect against bacteria and viruses. The fewer neutrophils you have, and the longer your body remains without them, the greater your risk of getting a severe infection becomes. CAUSES  The cause of neutropenia may be hard to determine. However, it is usually due to 3 main problems:   Decreased production of neutrophils. This may be due to:  Certain medicines such as chemotherapy.  Genetic problems.  Cancer.  Radiation treatments.  Vitamin deficiency.  Some pesticides.  Increased destruction of neutrophils. This may be due to:  Overwhelming infections.  Hemolytic anemia. This is when the body destroys its own blood cells.  Chemotherapy.  Neutrophils moving to areas of the body where they cannot fight infections. This may be due to:  Dialysis procedures.  Conditions where the spleen becomes enlarged. Neutrophils are held in the spleen and are not available to the rest of the body.  Overwhelming infections. The neutrophils are held in the area of the infection and are not available to the rest of the body. SYMPTOMS  There are no specific symptoms of neutropenia. The lack of neutrophils can result in an infection, and an infection can cause various problems. DIAGNOSIS  Diagnosis is made by a blood test. A complete blood count is performed. The normal level of neutrophils in human blood differs with age and race. Infants have lower counts than older children and adults. African Americans have lower counts than Caucasians or Asians. The average adult level is 1500 cells/mm3 of blood.  Neutrophil counts are interpreted as follows:  Greater than 1000 cells/mm3 gives normal protection against infection.  500 to 1000 cells/mm3 gives an increased risk for infection.  200 to 500 cells/mm3 is a greater risk for severe infection.  Lower than 200 cells/mm3 is a marked risk of infection. This may require hospitalization and treatment  with antibiotic medicines. TREATMENT  Treatment depends on the underlying cause, severity, and presence of infections or symptoms. It also depends on your health. Your caregiver will discuss the treatment plan with you. Mild cases are often easily treated and have a good outcome. Preventative measures may also be started to limit your risk of infections. Treatment can include:  Taking antibiotics.  Stopping medicines that are known to cause neutropenia.  Correcting nutritional deficiencies by eating green vegetables to supply folic acid and taking vitamin B supplements.  Stopping exposure to pesticides if your neutropenia is related to pesticide exposure.  Taking a blood growth factor called sargramostim, pegfilgrastim, or filgrastim if you are undergoing chemotherapy for cancer. This stimulates white blood cell production.  Removal of the spleen if you have Felty's syndrome and have repeated infections. HOME CARE INSTRUCTIONS   Follow your caregiver's instructions about when you need to have blood work done.  Wash your hands often. Make sure others who come in contact with you also wash their hands.  Wash raw fruits and vegetables before eating them. They can carry bacteria and fungi.  Avoid people with colds or spreadable (contagious) diseases (chickenpox, herpes zoster, influenza).  Avoid large crowds.  Avoid construction areas. The dust can release fungus into the air.  Be cautious around children in daycare or school environments.  Take care of your respiratory system by coughing and deep breathing.  Bathe daily.  Protect your skin from cuts and burns.  Do not work in the garden or with flowers and plants.  Care for the mouth before and after meals by brushing with a soft toothbrush. If you have mucositis, do not use mouthwash. Mouthwash contains alcohol and can dry out the mouth even more.  Clean the area between the genitals and the anus (perineal area) after urination  and bowel movements. Women need to wipe from front to back.  Use a water soluble lubricant during sexual intercourse and practice good hygiene after. Do not have intercourse if you are severely neutropenic. Check with your caregiver for guidelines.  Exercise daily as tolerated.  Avoid people who were vaccinated with a live vaccine in the past 30 days. You should not receive live vaccines (polio, typhoid).  Do not provide direct care for pets. Avoid animal droppings. Do not clean litter boxes and bird cages.  Do not share food utensils.  Do not use tampons, enemas, or rectal suppositories unless directed by your caregiver.  Use an electric razor to remove hair.  Wash your hands after handling magazines, letters, and newspapers. SEEK IMMEDIATE MEDICAL CARE IF:   You have a fever.  You have chills or start to shake.  You feel nauseous or vomit.  You develop mouth sores.  You develop aches and pains.  You have redness and swelling around open wounds.  Your skin is warm to the touch.  You have pus coming from your wounds.  You develop swollen lymph nodes.  You feel weak or fatigued.  You develop red streaks on the skin. MAKE SURE YOU:  Understand these instructions.  Will watch your condition.  Will get help right away if you are not doing well  or get worse.   This information is not intended to replace advice given to you by your health care provider. Make sure you discuss any questions you have with your health care provider.   Document Released: 12/27/2001 Document Revised: 09/29/2011 Document Reviewed: 01/17/2015 Elsevier Interactive Patient Education [employer].

## 2015-12-19 NOTE — Telephone Encounter (Signed)
Pt will receive inj in tx room today per [facility]

## 2015-12-20 ENCOUNTER — Ambulatory Visit (HOSPITAL_BASED_OUTPATIENT_CLINIC_OR_DEPARTMENT_OTHER): Payer: Self-pay

## 2015-12-20 VITALS — BP 102/76 | HR 50 | Temp 98.6°F | Resp 18

## 2015-12-20 DIAGNOSIS — C569 Malignant neoplasm of unspecified ovary: Secondary | ICD-10-CM

## 2015-12-20 DIAGNOSIS — C562 Malignant neoplasm of left ovary: Secondary | ICD-10-CM

## 2015-12-20 DIAGNOSIS — Z95828 Presence of other vascular implants and grafts: Secondary | ICD-10-CM

## 2015-12-20 DIAGNOSIS — Z1501 Genetic susceptibility to malignant neoplasm of breast: Principal | ICD-10-CM

## 2015-12-20 DIAGNOSIS — Z1509 Genetic susceptibility to other malignant neoplasm: Principal | ICD-10-CM

## 2015-12-20 LAB — CBC WITH DIFFERENTIAL/PLATELET
BASO%: 0.5 % (ref 0.0–2.0)
BASOS ABS: 0 10*3/uL (ref 0.0–0.1)
EOS%: 0.5 % (ref 0.0–7.0)
Eosinophils Absolute: 0 10*3/uL (ref 0.0–0.5)
HEMATOCRIT: 31.3 % — AB (ref 34.8–46.6)
HEMOGLOBIN: 10.1 g/dL — AB (ref 11.6–15.9)
LYMPH#: 1.2 10*3/uL (ref 0.9–3.3)
LYMPH%: 52.8 % — ABNORMAL HIGH (ref 14.0–49.7)
MCH: 29.3 pg (ref 25.1–34.0)
MCHC: 32.3 g/dL (ref 31.5–36.0)
MCV: 90.5 fL (ref 79.5–101.0)
MONO#: 0.1 10*3/uL (ref 0.1–0.9)
MONO%: 2.4 % (ref 0.0–14.0)
NEUT%: 43.8 % (ref 38.4–76.8)
NEUTROS ABS: 1 10*3/uL — AB (ref 1.5–6.5)
Platelets: 103 10*3/uL — ABNORMAL LOW (ref 145–400)
RBC: 3.45 10*6/uL — ABNORMAL LOW (ref 3.70–5.45)
RDW: 13.8 % (ref 11.2–14.5)
WBC: 2.2 10*3/uL — AB (ref 3.9–10.3)

## 2015-12-20 LAB — PREPARE PLATELET PHERESIS: Unit division: 0

## 2015-12-20 LAB — COMPREHENSIVE METABOLIC PANEL
ALBUMIN: 2.9 g/dL — AB (ref 3.5–5.0)
ALK PHOS: 53 U/L (ref 40–150)
AST: 13 U/L (ref 5–34)
Anion Gap: 8 mEq/L (ref 3–11)
BUN: 13.8 mg/dL (ref 7.0–26.0)
CALCIUM: 8.3 mg/dL — AB (ref 8.4–10.4)
CO2: 28 mEq/L (ref 22–29)
CREATININE: 0.9 mg/dL (ref 0.6–1.1)
Chloride: 101 mEq/L (ref 98–109)
EGFR: 75 mL/min/{1.73_m2} — ABNORMAL LOW (ref >90)
GLUCOSE: 102 mg/dL (ref 70–140)
Potassium: 3.2 mEq/L — ABNORMAL LOW (ref 3.5–5.1)
SODIUM: 137 meq/L (ref 136–145)
TOTAL PROTEIN: 5.9 g/dL — AB (ref 6.4–8.3)
Total Bilirubin: 0.3 mg/dL (ref 0.20–1.20)

## 2015-12-20 MED ORDER — SODIUM CHLORIDE 0.9 % IJ SOLN
10.0000 mL | INTRAMUSCULAR | Status: DC | PRN
  Administered 2015-12-20: 10 mL via INTRAVENOUS
  Filled 2015-12-20: qty 10

## 2015-12-20 MED ORDER — TBO-FILGRASTIM 480 MCG/0.8ML ~~LOC~~ SOSY
480.0000 ug | PREFILLED_SYRINGE | Freq: Once | SUBCUTANEOUS | Status: AC
  Administered 2015-12-20: 480 ug via SUBCUTANEOUS
  Filled 2015-12-20: qty 0.8

## 2015-12-20 MED ORDER — HEPARIN SOD (PORK) LOCK FLUSH 100 UNIT/ML IV SOLN
500.0000 [IU] | Freq: Once | INTRAVENOUS | Status: AC | PRN
  Administered 2015-12-20: 500 [IU] via INTRAVENOUS
  Filled 2015-12-20: qty 5

## 2015-12-20 NOTE — Patient Instructions (Signed)

## 2015-12-20 NOTE — Patient Instructions (Signed)

## 2015-12-21 ENCOUNTER — Other Ambulatory Visit: Payer: Self-pay | Admitting: Oncology

## 2015-12-21 ENCOUNTER — Telehealth: Payer: Self-pay

## 2015-12-21 DIAGNOSIS — R180 Malignant ascites: Secondary | ICD-10-CM

## 2015-12-21 DIAGNOSIS — C569 Malignant neoplasm of unspecified ovary: Secondary | ICD-10-CM

## 2015-12-21 NOTE — Telephone Encounter (Signed)
Told Ms. Zigmund Daniel that a paracenesis was set up at [facility] for Tuesday 12-25-15 at 1100.  She needs to arrive at 1045 at radiology. Ms Savoia verbalized understanding.

## 2015-12-21 NOTE — Telephone Encounter (Signed)
-----   Message from Gordy Levan, MD sent at 12/21/2015  8:51 AM EDT ----- Labs seen and need follow up: please check on her by phone. K a little low - would be good if able to increase in diet, but may do better with KCl 10 mEq daily #30. ? How much ascites in case needs paracentesis next week    Note neutropenic earlier this week    thanks

## 2015-12-21 NOTE — Telephone Encounter (Signed)
Told Kathleen Savon Giuffrida level as noted below by Dr. Marko Plume.  Kathleen Bond prefers to try increasing the potassium in her diet rather then taking the prescription supplement at this time. Pt with repeat KCL at visit 12-24-15. Kathleen. Sweeny feels that her abdomen is filling with fluid and is becoming more uncomfortable.  Scheduled her for 12-25-15 at [facility] [place] for paracentesis.

## 2015-12-21 NOTE — Telephone Encounter (Signed)
Pt states that her belly is more tight from fluid accumulation and wanted to see if a paracentesis can be done on Monday 12-24-15

## 2015-12-23 ENCOUNTER — Other Ambulatory Visit: Payer: Self-pay | Admitting: Oncology

## 2015-12-23 DIAGNOSIS — C561 Malignant neoplasm of right ovary: Secondary | ICD-10-CM

## 2015-12-23 DIAGNOSIS — Z1509 Genetic susceptibility to other malignant neoplasm: Principal | ICD-10-CM

## 2015-12-23 DIAGNOSIS — Z1501 Genetic susceptibility to malignant neoplasm of breast: Principal | ICD-10-CM

## 2015-12-24 ENCOUNTER — Ambulatory Visit (HOSPITAL_BASED_OUTPATIENT_CLINIC_OR_DEPARTMENT_OTHER): Payer: Self-pay

## 2015-12-24 ENCOUNTER — Other Ambulatory Visit (HOSPITAL_BASED_OUTPATIENT_CLINIC_OR_DEPARTMENT_OTHER): Payer: Self-pay

## 2015-12-24 ENCOUNTER — Ambulatory Visit (HOSPITAL_BASED_OUTPATIENT_CLINIC_OR_DEPARTMENT_OTHER): Payer: Self-pay | Admitting: Oncology

## 2015-12-24 ENCOUNTER — Ambulatory Visit (HOSPITAL_COMMUNITY)
Admission: RE | Admit: 2015-12-24 | Discharge: 2015-12-24 | Disposition: A | Discharge status: Home / Self Care | Payer: Self-pay | Source: Ambulatory Visit | Attending: Oncology | Admitting: Oncology

## 2015-12-24 ENCOUNTER — Encounter: Payer: Self-pay | Admitting: Oncology

## 2015-12-24 VITALS — BP 111/83 | HR 97 | Temp 98.6°F | Resp 18 | Wt 122.2 lb

## 2015-12-24 DIAGNOSIS — D701 Agranulocytosis secondary to cancer chemotherapy: Secondary | ICD-10-CM

## 2015-12-24 DIAGNOSIS — C561 Malignant neoplasm of right ovary: Secondary | ICD-10-CM

## 2015-12-24 DIAGNOSIS — Z1501 Genetic susceptibility to malignant neoplasm of breast: Secondary | ICD-10-CM

## 2015-12-24 DIAGNOSIS — D6481 Anemia due to antineoplastic chemotherapy: Secondary | ICD-10-CM

## 2015-12-24 DIAGNOSIS — C562 Malignant neoplasm of left ovary: Secondary | ICD-10-CM

## 2015-12-24 DIAGNOSIS — T451X5A Adverse effect of antineoplastic and immunosuppressive drugs, initial encounter: Secondary | ICD-10-CM

## 2015-12-24 DIAGNOSIS — Z1505 Genetic susceptibility to malignant neoplasm of fallopian tube(s): Secondary | ICD-10-CM

## 2015-12-24 DIAGNOSIS — E46 Unspecified protein-calorie malnutrition: Secondary | ICD-10-CM

## 2015-12-24 DIAGNOSIS — C569 Malignant neoplasm of unspecified ovary: Secondary | ICD-10-CM | POA: Insufficient documentation

## 2015-12-24 DIAGNOSIS — Z5189 Encounter for other specified aftercare: Secondary | ICD-10-CM

## 2015-12-24 DIAGNOSIS — R180 Malignant ascites: Secondary | ICD-10-CM | POA: Insufficient documentation

## 2015-12-24 DIAGNOSIS — Z95828 Presence of other vascular implants and grafts: Secondary | ICD-10-CM

## 2015-12-24 DIAGNOSIS — D6959 Other secondary thrombocytopenia: Secondary | ICD-10-CM

## 2015-12-24 DIAGNOSIS — Z1509 Genetic susceptibility to other malignant neoplasm: Principal | ICD-10-CM

## 2015-12-24 LAB — COMPREHENSIVE METABOLIC PANEL
ALBUMIN: 2.9 g/dL — AB (ref 3.5–5.0)
ALK PHOS: 78 U/L (ref 40–150)
AST: 13 U/L (ref 5–34)
Anion Gap: 9 mEq/L (ref 3–11)
BUN: 16.7 mg/dL (ref 7.0–26.0)
CO2: 30 meq/L — AB (ref 22–29)
CREATININE: 1 mg/dL (ref 0.6–1.1)
Calcium: 8.6 mg/dL (ref 8.4–10.4)
Chloride: 100 mEq/L (ref 98–109)
EGFR: 68 mL/min/{1.73_m2} — AB (ref >90)
GLUCOSE: 111 mg/dL (ref 70–140)
Potassium: 3.6 mEq/L (ref 3.5–5.1)
SODIUM: 139 meq/L (ref 136–145)
TOTAL PROTEIN: 6.2 g/dL — AB (ref 6.4–8.3)

## 2015-12-24 LAB — CBC WITH DIFFERENTIAL/PLATELET
BASO%: 0.2 % (ref 0.0–2.0)
Basophils Absolute: 0 10*3/uL (ref 0.0–0.1)
EOS ABS: 0 10*3/uL (ref 0.0–0.5)
EOS%: 0.4 % (ref 0.0–7.0)
HCT: 30.9 % — ABNORMAL LOW (ref 34.8–46.6)
HEMOGLOBIN: 10.2 g/dL — AB (ref 11.6–15.9)
LYMPH%: 43.6 % (ref 14.0–49.7)
MCH: 29.7 pg (ref 25.1–34.0)
MCHC: 32.9 g/dL (ref 31.5–36.0)
MCV: 90.2 fL (ref 79.5–101.0)
MONO#: 1 10*3/uL — ABNORMAL HIGH (ref 0.1–0.9)
MONO%: 26.7 % — AB (ref 0.0–14.0)
NEUT%: 29.1 % — ABNORMAL LOW (ref 38.4–76.8)
NEUTROS ABS: 1 10*3/uL — AB (ref 1.5–6.5)
Platelets: 223 10*3/uL (ref 145–400)
RBC: 3.42 10*6/uL — AB (ref 3.70–5.45)
RDW: 14.9 % — AB (ref 11.2–14.5)
WBC: 3.6 10*3/uL — AB (ref 3.9–10.3)
lymph#: 1.6 10*3/uL (ref 0.9–3.3)

## 2015-12-24 MED ORDER — HEPARIN SOD (PORK) LOCK FLUSH 100 UNIT/ML IV SOLN
500.0000 [IU] | Freq: Once | INTRAVENOUS | Status: AC
  Administered 2015-12-24: 500 [IU] via INTRAVENOUS
  Filled 2015-12-24: qty 5

## 2015-12-24 MED ORDER — SODIUM CHLORIDE 0.9% FLUSH
10.0000 mL | INTRAVENOUS | Status: DC | PRN
  Administered 2015-12-24: 10 mL via INTRAVENOUS
  Filled 2015-12-24: qty 10

## 2015-12-24 MED ORDER — TBO-FILGRASTIM 300 MCG/0.5ML ~~LOC~~ SOSY
300.0000 ug | PREFILLED_SYRINGE | Freq: Once | SUBCUTANEOUS | Status: AC
  Administered 2015-12-24: 300 ug via SUBCUTANEOUS
  Filled 2015-12-24: qty 0.5

## 2015-12-24 NOTE — Procedures (Signed)
Ultrasound-guided  therapeutic paracentesis performed yielding 2.4 liters of blood-tinged  fluid. No immediate complications.

## 2015-12-24 NOTE — Progress Notes (Signed)
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 She was unable to tolerate PARP inhibitor Lynparza, such that treatment was changed to topotecan with day 1 cycle 1 given 5-15 and day 8 cycle 1 on 12-10-15. Day 15 was held with neutropenia and thrombocytopenia ( ANC nadir 0.7 and platelet nadir 35k on day 17  cycle 1). She had gCSF days 15 and 16 and platelet transfusion 5-31.  Paracenteses by IR: 12-24-15 after this visit  2.4 liters, 5-26  1.9 liters, 5-19  2.4 liters, 5-11  2.7 liters, 5-1  2.5 liters, 4-20  2.9 liters.    Patient had no fever and no bleeding while counts were low. She has been very fatigued, at times staying in bed all day. Appetite was some better with megace, which she is taking just once daily, however she has difficulty eating with early satiety and is not optimizing nutrition in what she does eat and drink, discussed again. She had indigestion with juices that she is drinking for low K. She last took miralax ~ 4 days ago, which was the last time that bowels moved. Abdomen feels tight, no persistent discomfort. She is voiding. She denies increased SOB, is walking in office now. No problems with PAC  PAC  BRCA 2 mutation (p.[id number]*) by [employer] 10-12-14 Flu vaccine 04-02-15 CA 125 on 11-26-15 399  She is not driving.  At my request [facility] financial counselor spoke with her earlier today, patient still has not heard from [place]. 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 Patient is having a difficult time following up with the Medicaid application, largely due to this illness. Patient would like financial counselor to assist. Note she is on full assistance thru [place] for now.    ONCOLOGIC HISTORY  Left ovarian cancer: Patient became symptomatic with abdominal swelling and constipation in late Nov 2015, seen and treated initially for what was thought to be ulcer. Symptoms continued, with early satiety causing poor po intake; she was seen again at [facility] ED in early Jan 2016, with CT reportedly consistent with stage IIIC or IV gyn cancer, including omental caking, large pelvic mass, para-aortic adenopathy, ascites, pleural effusions and right subpleural nodule. She was seen in consultation by Dr Denman George on 07-25-14, with concern from her exam and scan for rectal involvement. 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 She had paracentesis of 3.4 liters ascites on 07-27-2014, with cytology ([id number]) showing serous carcinoma consistent with gyn primary, ER negative. She had right thoracentesis for 350 cc on 08-11-14, this requested by anesthesia prior to surgery and apparently not sent for cytology. Patient was more comfortable after paracentesis, but could not tell any difference in breathing after thoracentesis (denies SOB prior to that procedure). CA 125 on 08-02-14 was 1162. She was taken to exploratory laparotomy by Dr Denman George on 08-15-14, with 4 liters of ascites removed and biopsy of omentum obtained, with additional debulking not attempted due to surgical findings of entire peritoneal surface replaced by tumor plaque, small intestine coated with tumor and mesentery tethered with tumor nodules, ileum adherent to pelvic mass, dense plaque of tumor on right diaphragm and 10 cm omental cake in LUQ. Pathology of omental biopsy She needed  another paracentesis by IR for 1.3 liters ascites on 08-17-14, and had PAC placed by IR also 08-17-14. Day 1 cycle 1 dose dense carboplatin taxol was given in hospital on 08-18-14. After 2 cycles of dose dense regimen, the carboplatin and taxol was changed to every 3 week regimen beginning cycle  3; she had cycle 5 on 11-09-14, with gCSF support. She had interval debulking by Dr Denman George on 11-28-14. Surgery was exploratory laparotomy with TAH/ BSO, omentectomy and radical tumor debulking; R1 resection with residual milial tumor on small bowel at completion of procedure. Tumor involved bilateral adnexa, uterine serosa, omentum and falciform ligament, with + LVSI, no nodes examined. Maximum tumor size was 4.3 cm involving left ovary. She resumed carboplatin Taxol on 12-21-14, total of 3 cycles thru 02-01-15 (= 3 cycles after interval debulking). Restaging CT CAP 03-12-15 had some changes concerning for residual or recurrent disease, and CA 125 was elevated to 64 on 03-14-15, having been 6 in July 2016.  Review of prior radiation and chemotherapy (for left breast cancer) significant for use of epirubicin and some unquantified cardiac exposure to radiation. She began gemcitabine 04-09-15. She was neutropenic by 04-16-15. Regimen was changed to every other week gemzar with neulasta support, gemzar dose also decreased beginning cycle 2. CA 125 on 04-09-15 baseline for gemzar 313, down to 27 in Jan 2017, then 35 in Feb and 39 early Jan 2017. She continued gemzar thru 09-24-15. BRCA 2 abnormality identified 10-12-14. Patient took Lynparza at 400 mg bid from 4-6 thru 10-28-15, with diarrhea. She took one dose of 300 mg on 10-29-15 with nausea and vomiting. Winnie dropped to 1.1 on 11-05-15, but recovered on brief treatment break. She tolerated the Lynparza at 100 mg bid x 5 doses from 4-26 thru 11-16-15 using antiemetic premedication. By 4-28 she was again very uncomfortable from ascites, stopping the [place] and taking almost nothing po. Dose was decreased further to 100 mg bid tried 5-3 thru 11-23-15, with marked fatigue and poor po intake. Situation has been complicated by rapidly recurring large volume ascites, with paracentesis for 2.9 liters on 11-08-15, 2.5 liters on 11-19-15, 2.7 liters on 11-29-15 etc. She had day 1 cycle 1  topotecan on 12-03-15 and day 8 on 5-22, then neutropenic and thrombocytopenic by day 15.   Breast cancer: Left breast biopsy 11-22-2003 ([id number]) invasive carcinoma. Left breast wide excision with sentinel nodes and axillary contents by Dr Benson Norway 01-03-2004 [phone]) poorly differentiated invasive ductal carcinoma 3.0 cm with closest margin 1.7 cm deep, 6 sentinel nodes + 3 additional nodes all negative, ER 0, PR 0, HER 2 3+, Ki67 of 19%. . She received adjuvant chemotherapy by Dr Sonny Dandy with 5FU, epirubicin, cytoxan x 4 cycles from 02-13-2004 thru 04-16-2004, followed by taxol 80 mg/m2 weekly x 11 from 05-07-2004 thru 09-04-2004, and herceptin 05-21-2004 thru 02-25-2005. Antiemetics were Kytril and decadron. She had PAC for the chemotherapy. She had radiation by Dr Tyler Pita in [place]. Last note from Dr Sonny Dandy in this information was 04-2008. Left diagnostic mammogram [facility] 10-25-2014 stable compared with 12-06-13 and priors; bilateral at [facility] 02-13-15. Breast MRI 05-25-15 had no evidence of malignancy there, this medically necessary with BRCA 2 mutation.   Objective:  Vital signs in last 24 hours:  BP 111/83 mmHg  Pulse 97  Temp(Src) 98.6 F (37 C) (Oral)  Resp 18  Wt 122 lb 3.2 oz (55.43 kg)  SpO2 97% Weight stable, tho difficult to interpret with ascites.  Alert, oriented and appropriate. Ambulatory without  assistance. Looks fatigued and mildly uncomfortable but not in acute distress. Respirations not labored. A little pale, not icteric  HEENT:PERRL, sclerae not icteric. Oral mucosa moist without lesions, posterior pharynx clear.  Neck supple. No JVD.  Lymphatics:no supraclavicular or axillary adenopathy Resp: clear to auscultation bilaterally and normal percussion bilaterally Cardio: regular rate and rhythm. No gallop. GI: soft, nontender, distended tightly in upper abdomen especially. Some bowel sounds.  Musculoskeletal/ Extremities: without pitting edema, cords,  tenderness Neuro: no change peripheral neuropathy. Otherwise nonfocal. PSYCH appropriate mood and affect Skin without rash, ecchymosis, petechiae Portacath-without erythema or tenderness  Lab Results:  Results for orders placed or performed in visit on 12/24/15  CBC with Differential  Result Value Ref Range   WBC 3.6 (L) 3.9 - 10.3 10e3/uL   NEUT# 1.0 (L) 1.5 - 6.5 10e3/uL   HGB 10.2 (L) 11.6 - 15.9 g/dL   HCT 30.9 (L) 34.8 - 46.6 %   Platelets 223 145 - 400 10e3/uL   MCV 90.2 79.5 - 101.0 fL   MCH 29.7 25.1 - 34.0 pg   MCHC 32.9 31.5 - 36.0 g/dL   RBC 3.42 (L) 3.70 - 5.45 10e6/uL   RDW 14.9 (H) 11.2 - 14.5 %   lymph# 1.6 0.9 - 3.3 10e3/uL   MONO# 1.0 (H) 0.1 - 0.9 10e3/uL   Eosinophils Absolute 0.0 0.0 - 0.5 10e3/uL   Basophils Absolute 0.0 0.0 - 0.1 10e3/uL   NEUT% 29.1 (L) 38.4 - 76.8 %   LYMPH% 43.6 14.0 - 49.7 %   MONO% 26.7 (H) 0.0 - 14.0 %   EOS% 0.4 0.0 - 7.0 %   BASO% 0.2 0.0 - 2.0 %  Comprehensive metabolic panel  Result Value Ref Range   Sodium 139 136 - 145 mEq/L   Potassium 3.6 3.5 - 5.1 mEq/L   Chloride 100 98 - 109 mEq/L   CO2 30 (H) 22 - 29 mEq/L   Glucose 111 70 - 140 mg/dl   BUN 16.7 7.0 - 26.0 mg/dL   Creatinine 1.0 0.6 - 1.1 mg/dL   Total Bilirubin <0.30 0.20 - 1.20 mg/dL   Alkaline Phosphatase 78 40 - 150 U/L   AST 13 5 - 34 U/L   ALT <9 0 - 55 U/L   Total Protein 6.2 (L) 6.4 - 8.3 g/dL   Albumin 2.9 (L) 3.5 - 5.0 g/dL   Calcium 8.6 8.4 - 10.4 mg/dL   Anion Gap 9 3 - 11 mEq/L   EGFR 68 (L) >90 ml/min/1.73 m2     Studies/Results:  US Paracentesis  12/24/2015  INDICATION: Ovarian cancer, recurrent ascites. Request is made for therapeutic paracentesis. EXAM: ULTRASOUND GUIDED THERAPEUTIC PARACENTESIS MEDICATIONS: None. COMPLICATIONS: None immediate. PROCEDURE: Informed written consent was obtained from the patient after a discussion of the risks, benefits and alternatives to treatment. A timeout was performed prior to the initiation of the  procedure. Initial ultrasound scanning demonstrates a small to moderate amount of ascites within the right lower abdominal quadrant. The right lower abdomen was prepped and draped in the usual sterile fashion. 1% lidocaine was used for local anesthesia. Following this, a Yueh catheter was introduced. An ultrasound image was saved for documentation purposes. The paracentesis was performed. The catheter was removed and a dressing was applied. The patient tolerated the procedure well without immediate post procedural complication. FINDINGS: A total of approximately 2.4 liters of blood-tinged fluid was removed. Samples were sent to the laboratory as requested by the clinical team. IMPRESSION: Successful ultrasound-guided therapeutic paracentesis  yielding 2.4 liters of peritoneal fluid. Read by: Rowe Robert, PA-C Electronically Signed   By: Sandi Mariscal M.D.   On: 12/24/2015 16:03    Medications: I have reviewed the patient's current medications. Encouraged patient to take miralax daily to keep bowels moving; if unable to tolerate miralax, should change to SenokotS or equivalent ~ 2 tablets bid.  Granix given today.  DISCUSSION For paracentesis after MD visit. I do not believe she could manage peritoneal drain without some home nursing assistance.  I have told patient that fatigue is multifactorial, with advanced cancer, rapidly reoccurring malignant ascites, poor nutrition, cytopenias, and chemo. Options are to try to continue treatment in attempt to control disease. or change to comfort care with Hospice. She would like to try to continue topotecan to see if additional doses show benefit.  I have decreased topotecan by 50% for cycle 2, which will start on 12-31-15 as long as [place] >=1.5 and plt >=100k. Will add granix after each topotecan.    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 Rapid recurrence/ progression of gyn cancer within 4 weeks of completing that chemo. Paracentesis confirmed serous malignancy. Responded initially to gemzar but progression by early March 2017. Olaparib begun 10-25-15 at 193 mg bid, complicated by marked fatigue and GI side effects, unable to tolerate even at much reduced dosing thru 12-01-15. Rapid recurrence of ascites, multiple paracenteses since 11-08-15. Changed therapy to topotecan day 1 cycle 1 on 12-03-15, plan weekly x3 every 28 days, day 15 cycle 1 held with neutropenia and thrombocytopenia. Cycle 2 day 1 6-12, parameters as above.  2.BRCA 2 mutation. PARP inhibitor olaparib not tolerated. 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 BRCA 2+. Bilateral mammograms at [facility] done 02-13-15, breast MRI done 05-25-15, no evidence of active malignancy then. 4.PAC in 5.chemo and iron deficiency anemia: continue oral iron as she can tolerate 6.Protein calorie malnutrition: early satiety with ascites, suboptimal nutrition choices, needs to use [employer], nutritionist to see again on 12-31-15 7.RBBB and LAFB, HTN, known to Dr Acie Fredrickson. Previous epirubicin, some cardiac exposure from left breast radiation. 8.hypothyroidism on replacement by PCP, recently seen 9.post parathyroidectomy 2005 10.insurance concerns: Medicaid application still pending, [facility] managed care following up. I do not expect that we could get home nursing with no insurance (considering peritoneal drain). 11..taxol peripheral neuropathy somewhat improved, continues gabapentin prn at hs 12..advance directives in place 13.hypokalemia: improved with increase in diet now   All questions answered  and she knows to call if needed prior to next scheduled visit. Chemo orders adjusted. Will need granix days 2, 9, 16. Time spent 30 min including >50% counseling and coordination of care.     LIVESAY,LENNIS P, MD   12/24/2015, 5:29 PM

## 2015-12-25 ENCOUNTER — Ambulatory Visit (HOSPITAL_COMMUNITY): Payer: Self-pay

## 2015-12-26 ENCOUNTER — Encounter: Payer: Self-pay | Admitting: Oncology

## 2015-12-26 ENCOUNTER — Other Ambulatory Visit: Payer: Self-pay | Admitting: Oncology

## 2015-12-26 DIAGNOSIS — D6959 Other secondary thrombocytopenia: Secondary | ICD-10-CM | POA: Insufficient documentation

## 2015-12-26 DIAGNOSIS — T451X5A Adverse effect of antineoplastic and immunosuppressive drugs, initial encounter: Secondary | ICD-10-CM

## 2015-12-26 NOTE — Progress Notes (Signed)
Gave income information to Raquel to see if drug replacement available for Granix.

## 2015-12-26 NOTE — Progress Notes (Signed)
Patient called to inform me she spoke with DSS concerning Medicaid. Patient states she was told she was denied because she was over the income. Patient gave her caseworker my name and number and gave her permission to speak with me but states she was told there was a release form that had to be completed. Called caseworker Sharmon Revere to confirm patient was denied due to income and she said yes.

## 2015-12-31 ENCOUNTER — Other Ambulatory Visit: Payer: Self-pay | Admitting: Oncology

## 2015-12-31 ENCOUNTER — Ambulatory Visit: Payer: Self-pay | Admitting: Nutrition

## 2015-12-31 ENCOUNTER — Other Ambulatory Visit (HOSPITAL_BASED_OUTPATIENT_CLINIC_OR_DEPARTMENT_OTHER): Payer: Self-pay

## 2015-12-31 ENCOUNTER — Encounter: Payer: Self-pay | Admitting: Oncology

## 2015-12-31 ENCOUNTER — Ambulatory Visit (HOSPITAL_BASED_OUTPATIENT_CLINIC_OR_DEPARTMENT_OTHER): Payer: Self-pay

## 2015-12-31 ENCOUNTER — Ambulatory Visit: Payer: Self-pay

## 2015-12-31 DIAGNOSIS — C569 Malignant neoplasm of unspecified ovary: Secondary | ICD-10-CM

## 2015-12-31 DIAGNOSIS — Z1502 Genetic susceptibility to malignant neoplasm of ovary: Secondary | ICD-10-CM

## 2015-12-31 DIAGNOSIS — Z1501 Genetic susceptibility to malignant neoplasm of breast: Principal | ICD-10-CM

## 2015-12-31 DIAGNOSIS — Z5111 Encounter for antineoplastic chemotherapy: Secondary | ICD-10-CM

## 2015-12-31 DIAGNOSIS — Z1509 Genetic susceptibility to other malignant neoplasm: Principal | ICD-10-CM

## 2015-12-31 DIAGNOSIS — C562 Malignant neoplasm of left ovary: Secondary | ICD-10-CM

## 2015-12-31 DIAGNOSIS — C561 Malignant neoplasm of right ovary: Secondary | ICD-10-CM

## 2015-12-31 DIAGNOSIS — Z95828 Presence of other vascular implants and grafts: Secondary | ICD-10-CM

## 2015-12-31 LAB — CBC WITH DIFFERENTIAL/PLATELET
BASO%: 0.5 % (ref 0.0–2.0)
BASOS ABS: 0 10*3/uL (ref 0.0–0.1)
EOS%: 0.1 % (ref 0.0–7.0)
Eosinophils Absolute: 0 10*3/uL (ref 0.0–0.5)
HEMATOCRIT: 34.8 % (ref 34.8–46.6)
HGB: 11.4 g/dL — ABNORMAL LOW (ref 11.6–15.9)
LYMPH#: 1.8 10*3/uL (ref 0.9–3.3)
LYMPH%: 26.6 % (ref 14.0–49.7)
MCH: 29.7 pg (ref 25.1–34.0)
MCHC: 32.8 g/dL (ref 31.5–36.0)
MCV: 90.4 fL (ref 79.5–101.0)
MONO#: 0.9 10*3/uL (ref 0.1–0.9)
MONO%: 13.6 % (ref 0.0–14.0)
NEUT#: 4.1 10*3/uL (ref 1.5–6.5)
NEUT%: 59.2 % (ref 38.4–76.8)
PLATELETS: 632 10*3/uL — AB (ref 145–400)
RBC: 3.85 10*6/uL (ref 3.70–5.45)
RDW: 15.3 % — ABNORMAL HIGH (ref 11.2–14.5)
WBC: 6.9 10*3/uL (ref 3.9–10.3)

## 2015-12-31 LAB — COMPREHENSIVE METABOLIC PANEL
ALBUMIN: 2.9 g/dL — AB (ref 3.5–5.0)
ALK PHOS: 77 U/L (ref 40–150)
ALT: 10 U/L (ref 0–55)
AST: 19 U/L (ref 5–34)
Anion Gap: 10 mEq/L (ref 3–11)
BUN: 20.9 mg/dL (ref 7.0–26.0)
CO2: 37 meq/L — AB (ref 22–29)
Calcium: 8.7 mg/dL (ref 8.4–10.4)
Chloride: 91 mEq/L — ABNORMAL LOW (ref 98–109)
Creatinine: 1.5 mg/dL — ABNORMAL HIGH (ref 0.6–1.1)
EGFR: 42 mL/min/{1.73_m2} — ABNORMAL LOW (ref >90)
GLUCOSE: 120 mg/dL (ref 70–140)
POTASSIUM: 4 meq/L (ref 3.5–5.1)
SODIUM: 137 meq/L (ref 136–145)
Total Bilirubin: <0.3 mg/dL (ref 0.20–1.20)
Total Protein: 6.6 g/dL (ref 6.4–8.3)

## 2015-12-31 MED ORDER — HEPARIN SOD (PORK) LOCK FLUSH 100 UNIT/ML IV SOLN
500.0000 [IU] | Freq: Once | INTRAVENOUS | Status: AC | PRN
  Administered 2015-12-31: 500 [IU]
  Filled 2015-12-31: qty 5

## 2015-12-31 MED ORDER — PROCHLORPERAZINE MALEATE 10 MG PO TABS
10.0000 mg | ORAL_TABLET | Freq: Once | ORAL | Status: AC
  Administered 2015-12-31: 10 mg via ORAL

## 2015-12-31 MED ORDER — SODIUM CHLORIDE 0.9 % IV SOLN
INTRAVENOUS | Status: AC
  Administered 2015-12-31: 14:00:00 via INTRAVENOUS

## 2015-12-31 MED ORDER — SODIUM CHLORIDE 0.9% FLUSH
10.0000 mL | INTRAVENOUS | Status: DC | PRN
  Filled 2015-12-31: qty 10

## 2015-12-31 MED ORDER — TOPOTECAN HCL CHEMO INJECTION 4 MG
0.7500 mg/m2 | Freq: Once | INTRAVENOUS | Status: AC
  Administered 2015-12-31: 1.2 mg via INTRAVENOUS
  Filled 2015-12-31: qty 1.2

## 2015-12-31 MED ORDER — SODIUM CHLORIDE 0.9 % IV SOLN
Freq: Once | INTRAVENOUS | Status: AC
  Administered 2015-12-31: 13:00:00 via INTRAVENOUS

## 2015-12-31 MED ORDER — SODIUM CHLORIDE 0.9% FLUSH
10.0000 mL | INTRAVENOUS | Status: DC | PRN
  Administered 2015-12-31: 10 mL
  Filled 2015-12-31: qty 10

## 2015-12-31 MED ORDER — PROCHLORPERAZINE MALEATE 10 MG PO TABS
ORAL_TABLET | ORAL | Status: AC
  Filled 2015-12-31: qty 1

## 2015-12-31 NOTE — Progress Notes (Signed)
Met with patient today for follow up.Asked patient if anyone discussed any home health with her referencing back to a staff message received from [place] concerning this and she states no. Asked patient if she had received anything regarding applying for Medicare and she states she had not. Reviewed online and requested information be sent to her if eligible. Asked SW(Lauren) to confirm if there was somewhere patient could apply and she states no, once she becomes eligible she will be sent information. Asked patient if she had any other bills outside of [facility] that she may possibly submit to [facility] and 2017 SSI disability award. She states she would look at home and bring them in. Assisted patient in contacting bank for recent statements to renew [facility] FA which expires soon. They should be faxing information directly to me. She will bring documentation to me as soon as she can and we will move forward with the application. As far as Medicaid, patient states she was not informed about possibly applying for Medicaid with a deductible and how to submit bills. Advised patient once she receives the required balance, we can then submit bills to be paid which can include bills outside of [facility]. If full Medicaid is approved, the [facility] FA will not be valid. Patient states she is also having to pay for  medications and she has exhausted her grants through [place]. Patient has my card for any additional financial questions or concerns.

## 2015-12-31 NOTE — Patient Instructions (Signed)
[facility] Discharge Instructions for Patients Receiving Chemotherapy  Today you received the following chemotherapy agents Topotecan  To help prevent nausea and vomiting after your treatment, we encourage you to take your nausea medication   If you develop nausea and vomiting that is not controlled by your nausea medication, call the clinic.   BELOW ARE SYMPTOMS THAT SHOULD BE REPORTED IMMEDIATELY:  *FEVER GREATER THAN 100.5 F  *CHILLS WITH OR WITHOUT FEVER  NAUSEA AND VOMITING THAT IS NOT CONTROLLED WITH YOUR NAUSEA MEDICATION  *UNUSUAL SHORTNESS OF BREATH  *UNUSUAL BRUISING OR BLEEDING  TENDERNESS IN MOUTH AND THROAT WITH OR WITHOUT PRESENCE OF ULCERS  *URINARY PROBLEMS  *BOWEL PROBLEMS  UNUSUAL RASH Items with * indicate a potential emergency and should be followed up as soon as possible.  Feel free to call the clinic you have any questions or concerns. The clinic phone number is [phone].  Please show the [place] at check-in to the Emergency Department and triage nurse.

## 2015-12-31 NOTE — Progress Notes (Signed)
Nutrition follow-up completed with patient during infusion for ovarian cancer. Weight is stable and documented as 122.2 pounds on June 5. Patient is having periodical paracentesis for fluid removal. Reviewed labs and noted albumin of 2.9, and creatinine 1.2. Patient reports increased fatigue and poor appetite. She has early satiety with increased ascites. Patient is trying to drink Carnation breakfast essentials once a day.  She prefers juices, water and fruit. Patient able to list a number of protein foods and is trying to incorporate into her day.  Nutrition diagnosis: Food and nutrition related knowledge deficit improved.  Intervention:  Recommended patient continue small frequent meals with protein every time she eats. Provided several suggestions for cold protein foods. Encouraged patient to consume one Carnation breakfast essentials daily at a minimum. Questions answered.  Teach back method used.  Monitoring, evaluation, goals: Patient will tolerate adequate calories and protein to maintain lean body mass.  Next visit: Monday, July 10, during infusion.  **Disclaimer: This note was dictated with voice recognition software. Similar sounding words can inadvertently be transcribed and this note may contain transcription errors which may not have been corrected upon publication of note.**

## 2015-12-31 NOTE — Progress Notes (Signed)
Labs drawn in lab, not by port

## 2016-01-01 ENCOUNTER — Ambulatory Visit: Payer: Self-pay

## 2016-01-01 ENCOUNTER — Telehealth: Payer: Self-pay

## 2016-01-01 ENCOUNTER — Ambulatory Visit (HOSPITAL_COMMUNITY): Admission: RE | Admit: 2016-01-01 | Payer: Self-pay | Source: Ambulatory Visit

## 2016-01-01 LAB — CA 125: Cancer Antigen (CA) 125: 731.7 U/mL — ABNORMAL HIGH (ref 0.0–38.1)

## 2016-01-01 NOTE — Telephone Encounter (Signed)
Kathleen Bond called to have a paracentesis scheduled as she is about to burst. Pt not aware of paracentesis appointment this am at 100 and Granix injection at 1130. R/s paracentesis for 1100 01-02-16 and Granix injection at 1300.  Kathleen Bond verbalized understanding.

## 2016-01-02 ENCOUNTER — Encounter: Payer: Self-pay | Admitting: Oncology

## 2016-01-02 ENCOUNTER — Ambulatory Visit (HOSPITAL_COMMUNITY)
Admission: RE | Admit: 2016-01-02 | Discharge: 2016-01-02 | Disposition: A | Discharge status: Home / Self Care | Payer: Self-pay | Source: Ambulatory Visit | Attending: Oncology | Admitting: Oncology

## 2016-01-02 ENCOUNTER — Other Ambulatory Visit: Payer: Self-pay | Admitting: Oncology

## 2016-01-02 ENCOUNTER — Ambulatory Visit (HOSPITAL_BASED_OUTPATIENT_CLINIC_OR_DEPARTMENT_OTHER): Payer: Self-pay

## 2016-01-02 VITALS — BP 90/60 | HR 77 | Temp 97.8°F | Resp 18

## 2016-01-02 DIAGNOSIS — R188 Other ascites: Secondary | ICD-10-CM | POA: Insufficient documentation

## 2016-01-02 DIAGNOSIS — Z1509 Genetic susceptibility to other malignant neoplasm: Secondary | ICD-10-CM

## 2016-01-02 DIAGNOSIS — Z5189 Encounter for other specified aftercare: Secondary | ICD-10-CM

## 2016-01-02 DIAGNOSIS — Z1501 Genetic susceptibility to malignant neoplasm of breast: Secondary | ICD-10-CM

## 2016-01-02 DIAGNOSIS — C562 Malignant neoplasm of left ovary: Secondary | ICD-10-CM

## 2016-01-02 DIAGNOSIS — C569 Malignant neoplasm of unspecified ovary: Secondary | ICD-10-CM

## 2016-01-02 DIAGNOSIS — Z95828 Presence of other vascular implants and grafts: Secondary | ICD-10-CM

## 2016-01-02 DIAGNOSIS — C561 Malignant neoplasm of right ovary: Secondary | ICD-10-CM

## 2016-01-02 MED ORDER — HEPARIN SOD (PORK) LOCK FLUSH 100 UNIT/ML IV SOLN
500.0000 [IU] | Freq: Once | INTRAVENOUS | Status: DC | PRN
  Filled 2016-01-02: qty 5

## 2016-01-02 MED ORDER — TBO-FILGRASTIM 480 MCG/0.8ML ~~LOC~~ SOSY
480.0000 ug | PREFILLED_SYRINGE | Freq: Once | SUBCUTANEOUS | Status: AC
  Administered 2016-01-02: 480 ug via SUBCUTANEOUS
  Filled 2016-01-02: qty 0.8

## 2016-01-02 NOTE — Procedures (Signed)
Ultrasound-guided therapeutic paracentesis performed yielding 2.7 liters of serosanguineous colored fluid. No immediate complications.  Keilen Kahl E 12:54 PM 01/02/2016

## 2016-01-02 NOTE — Progress Notes (Signed)
Patient dropped off 2017 SS letter.

## 2016-01-02 NOTE — Patient Instructions (Signed)

## 2016-01-03 ENCOUNTER — Other Ambulatory Visit: Payer: Self-pay | Admitting: Oncology

## 2016-01-03 ENCOUNTER — Encounter: Payer: Self-pay | Admitting: Oncology

## 2016-01-03 ENCOUNTER — Telehealth: Payer: Self-pay | Admitting: Oncology

## 2016-01-03 DIAGNOSIS — R180 Malignant ascites: Secondary | ICD-10-CM

## 2016-01-03 NOTE — Progress Notes (Signed)
Faxed core  [phone] for poss asst with granix. See pre ntoes

## 2016-01-03 NOTE — Telephone Encounter (Signed)
Medical Oncology  Called to check on patient now.  She "has been in bed today",  had soreness in abdomen but no nausea after topotecan 12-31-15 which was further dose reduced due to creatinine 1.5;  dose reduction was based on parameters for daily x 5 dosing per my discussion with [facility] pharmacists, tho patient is being treated weekly.  She also had paracentesis again on 01-02-16, for 2.7 liters. She has required paracenteses essentially weekly despite [place] previously and topotecan now. She is more comfortable since the most recent paracentesis, able to eat jello, gatorade and poached egg today (total).  I have told patient that I am concerned that we are not seeing any improvement in the very symptomatic, large volume ascites. I have told her that she may do better in near future by discontinuing chemo and managing symptoms with Hospice assistance. If hospice is involved, she could have peritoneal catheter placed by IR and drainage of the ascites in smaller amounts ~ 2x weekly at home.  Patient tells me that she will consider this.  Note no insurance/ not medicaid eligible. I do not think indigent care by [place] would be as appropriate as Hospice in this situation.   Will request another paracentesis be available next week if needed.   Patient expressed appreciation for call.   Godfrey Pick, MD

## 2016-01-07 ENCOUNTER — Ambulatory Visit (HOSPITAL_BASED_OUTPATIENT_CLINIC_OR_DEPARTMENT_OTHER): Payer: Self-pay

## 2016-01-07 ENCOUNTER — Telehealth: Payer: Self-pay | Admitting: Oncology

## 2016-01-07 ENCOUNTER — Ambulatory Visit: Payer: Self-pay

## 2016-01-07 ENCOUNTER — Encounter: Payer: Self-pay | Admitting: Oncology

## 2016-01-07 ENCOUNTER — Other Ambulatory Visit (HOSPITAL_BASED_OUTPATIENT_CLINIC_OR_DEPARTMENT_OTHER): Payer: Self-pay

## 2016-01-07 ENCOUNTER — Ambulatory Visit (HOSPITAL_BASED_OUTPATIENT_CLINIC_OR_DEPARTMENT_OTHER): Payer: Self-pay | Admitting: Oncology

## 2016-01-07 VITALS — BP 103/61 | HR 108 | Temp 98.4°F | Resp 18 | Ht 62.0 in | Wt 114.9 lb

## 2016-01-07 VITALS — BP 106/74 | HR 95 | Temp 98.4°F | Resp 18

## 2016-01-07 DIAGNOSIS — E039 Hypothyroidism, unspecified: Secondary | ICD-10-CM

## 2016-01-07 DIAGNOSIS — C561 Malignant neoplasm of right ovary: Secondary | ICD-10-CM

## 2016-01-07 DIAGNOSIS — R112 Nausea with vomiting, unspecified: Secondary | ICD-10-CM

## 2016-01-07 DIAGNOSIS — C562 Malignant neoplasm of left ovary: Secondary | ICD-10-CM

## 2016-01-07 DIAGNOSIS — Z95828 Presence of other vascular implants and grafts: Secondary | ICD-10-CM

## 2016-01-07 DIAGNOSIS — R180 Malignant ascites: Secondary | ICD-10-CM

## 2016-01-07 DIAGNOSIS — T451X5A Adverse effect of antineoplastic and immunosuppressive drugs, initial encounter: Principal | ICD-10-CM

## 2016-01-07 LAB — CBC WITH DIFFERENTIAL/PLATELET
BASO%: 0.5 % (ref 0.0–2.0)
BASOS ABS: 0 10*3/uL (ref 0.0–0.1)
EOS%: 0 % (ref 0.0–7.0)
Eosinophils Absolute: 0 10*3/uL (ref 0.0–0.5)
HEMATOCRIT: 29.5 % — AB (ref 34.8–46.6)
HGB: 9.8 g/dL — ABNORMAL LOW (ref 11.6–15.9)
LYMPH#: 1.9 10*3/uL (ref 0.9–3.3)
LYMPH%: 28.5 % (ref 14.0–49.7)
MCH: 29.5 pg (ref 25.1–34.0)
MCHC: 33.1 g/dL (ref 31.5–36.0)
MCV: 89.2 fL (ref 79.5–101.0)
MONO#: 1 10*3/uL — ABNORMAL HIGH (ref 0.1–0.9)
MONO%: 14.6 % — AB (ref 0.0–14.0)
NEUT#: 3.7 10*3/uL (ref 1.5–6.5)
NEUT%: 56.4 % (ref 38.4–76.8)
Platelets: 535 10*3/uL — ABNORMAL HIGH (ref 145–400)
RBC: 3.31 10*6/uL — ABNORMAL LOW (ref 3.70–5.45)
RDW: 15.5 % — AB (ref 11.2–14.5)
WBC: 6.6 10*3/uL (ref 3.9–10.3)

## 2016-01-07 LAB — COMPREHENSIVE METABOLIC PANEL
ALT: 19 U/L (ref 0–55)
AST: 21 U/L (ref 5–34)
Albumin: 2.7 g/dL — ABNORMAL LOW (ref 3.5–5.0)
Alkaline Phosphatase: 73 U/L (ref 40–150)
Anion Gap: 9 mEq/L (ref 3–11)
BUN: 36.3 mg/dL — AB (ref 7.0–26.0)
CHLORIDE: 86 meq/L — AB (ref 98–109)
CO2: 35 meq/L — AB (ref 22–29)
CREATININE: 2 mg/dL — AB (ref 0.6–1.1)
Calcium: 8.6 mg/dL (ref 8.4–10.4)
EGFR: 29 mL/min/{1.73_m2} — ABNORMAL LOW (ref >90)
GLUCOSE: 116 mg/dL (ref 70–140)
POTASSIUM: 3.3 meq/L — AB (ref 3.5–5.1)
SODIUM: 130 meq/L — AB (ref 136–145)
Total Bilirubin: <0.3 mg/dL (ref 0.20–1.20)
Total Protein: 6.2 g/dL — ABNORMAL LOW (ref 6.4–8.3)

## 2016-01-07 MED ORDER — SODIUM CHLORIDE 0.9 % IV SOLN
INTRAVENOUS | Status: DC
  Administered 2016-01-07: 15:00:00 via INTRAVENOUS

## 2016-01-07 MED ORDER — SODIUM CHLORIDE 0.9 % IJ SOLN
10.0000 mL | INTRAMUSCULAR | Status: DC | PRN
  Administered 2016-01-07: 10 mL via INTRAVENOUS
  Filled 2016-01-07: qty 10

## 2016-01-07 MED ORDER — SODIUM CHLORIDE 0.9% FLUSH
10.0000 mL | INTRAVENOUS | Status: DC | PRN
  Administered 2016-01-07: 10 mL via INTRAVENOUS
  Filled 2016-01-07: qty 10

## 2016-01-07 MED ORDER — HEPARIN SOD (PORK) LOCK FLUSH 100 UNIT/ML IV SOLN
500.0000 [IU] | Freq: Once | INTRAVENOUS | Status: AC
  Administered 2016-01-07: 500 [IU] via INTRAVENOUS
  Filled 2016-01-07: qty 5

## 2016-01-07 NOTE — Progress Notes (Signed)
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 She was unable to tolerate PARP inhibitor Lynparza, such that treatment was changed to topotecan with day 1 cycle 1 given 5-15 and day 8 cycle 1 on 12-10-15. Day 15 was held with neutropenia and thrombocytopenia ( ANC nadir 0.7 and platelet nadir 35k on day 17  cycle 1). She had gCSF days 15 and 16 and platelet transfusion 5-31.  Paracenteses by IR: 12-24-15 after this visit  2.4 liters, 5-26  1.9 liters, 5-19  2.4 liters, 5-11  2.7 liters, 5-1  2.5 liters, 4-20  2.9 liters.    Patient remains very fatigued, at times staying in bed all day. Appetite remains poor despite megace, which she is taking just once daily, however she has difficulty eating with early satiety and is not optimizing nutrition in what she does eat and drink, discussed again. She had indigestion with juices that she is drinking for low K. states that things are not staying down very well and that she vomits after eating solid foods. She is taking fluids well and states that she is eating Jell-O and popsicles. Reports that her bowels are moving on a regular basis. Last bowel movement was this morning. Denies abdominal pain. The patient has lost 14 pounds in about 2 weeks of time. Abdomen feels tight, no persistent discomfort. Has new onset edema to her left lower extremity. Denies pain to her leg. She is voiding. She denies increased SOB, in wheelchair today. No problems with PAC. Patient had telephone conversation last week with Dr. Marko Plume and the concept of hospice was brought up during that discussion. The patient has inquired about hospice today and has expressed interest in hearing  about their services.  PAC  BRCA 2 mutation ([id number]*) by [employer] 10-12-14 Flu vaccine 04-02-15 CA 125 on 11-26-15 399; CA 125 on 6-12/17 731  She is not driving.    ONCOLOGIC HISTORY  Left ovarian cancer: Patient became symptomatic with abdominal swelling and constipation in late Nov 2015, seen and treated initially for what was thought to be ulcer. Symptoms continued, with early satiety causing poor po intake; she was seen again at [facility] ED in early Jan 2016, with CT reportedly consistent with stage IIIC or IV gyn cancer, including omental caking, large pelvic mass, para-aortic adenopathy, ascites, pleural effusions and right subpleural nodule. She was seen in consultation by Dr Denman George on 07-25-14, with concern from her exam and scan for rectal involvement. 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 She had paracentesis of 3.4 liters ascites on 07-27-2014, with cytology ([id number]) showing serous carcinoma consistent with gyn primary, ER negative. She had right thoracentesis for 350 cc on 08-11-14, this requested by anesthesia prior to surgery and apparently not sent for cytology. Patient was more comfortable after paracentesis, but could not tell any difference in breathing after thoracentesis (denies SOB prior to that procedure). CA 125 on 08-02-14 was 1162. She was taken to exploratory laparotomy by Dr Denman George on 08-15-14, with 4 liters of ascites removed and biopsy of omentum obtained, with additional debulking not attempted due to surgical findings of entire peritoneal surface replaced by tumor plaque, small intestine coated with tumor and mesentery tethered with  tumor nodules, ileum adherent to pelvic mass, dense plaque of tumor on right diaphragm and 10 cm omental cake in LUQ. Pathology of omental biopsy She needed another paracentesis by IR for 1.3 liters ascites on 08-17-14, and had PAC placed by IR also 08-17-14. Day 1 cycle 1 dose  dense carboplatin taxol was given in hospital on 08-18-14. After 2 cycles of dose dense regimen, the carboplatin and taxol was changed to every 3 week regimen beginning cycle 3; she had cycle 5 on 11-09-14, with gCSF support. She had interval debulking by Dr Denman George on 11-28-14. Surgery was exploratory laparotomy with TAH/ BSO, omentectomy and radical tumor debulking; R1 resection with residual milial tumor on small bowel at completion of procedure. Tumor involved bilateral adnexa, uterine serosa, omentum and falciform ligament, with + LVSI, no nodes examined. Maximum tumor size was 4.3 cm involving left ovary. She resumed carboplatin Taxol on 12-21-14, total of 3 cycles thru 02-01-15 (= 3 cycles after interval debulking). Restaging CT CAP 03-12-15 had some changes concerning for residual or recurrent disease, and CA 125 was elevated to 64 on 03-14-15, having been 6 in July 2016.  Review of prior radiation and chemotherapy (for left breast cancer) significant for use of epirubicin and some unquantified cardiac exposure to radiation. She began gemcitabine 04-09-15. She was neutropenic by 04-16-15. Regimen was changed to every other week gemzar with neulasta support, gemzar dose also decreased beginning cycle 2. CA 125 on 04-09-15 baseline for gemzar 313, down to 27 in Jan 2017, then 35 in Feb and 39 early Jan 2017. She continued gemzar thru 09-24-15. BRCA 2 abnormality identified 10-12-14. Patient took Lynparza at 400 mg bid from 4-6 thru 10-28-15, with diarrhea. She took one dose of 300 mg on 10-29-15 with nausea and vomiting. [place] dropped to 1.1 on 11-05-15, but recovered on brief treatment break. She tolerated the Lynparza at 100 mg bid x 5 doses from 4-26 thru 11-16-15 using antiemetic premedication. By 4-28 she was again very uncomfortable from ascites, stopping the [place] and taking almost nothing po. Dose was decreased further to 100 mg bid tried 5-3 thru 11-23-15, with marked fatigue and poor po intake. Situation has been  complicated by rapidly recurring large volume ascites, with paracentesis for 2.9 liters on 11-08-15, 2.5 liters on 11-19-15, 2.7 liters on 11-29-15 etc. She had day 1 cycle 1 topotecan on 12-03-15 and day 8 on 5-22, then neutropenic and thrombocytopenic by day 15.   Breast cancer: Left breast biopsy 11-22-2003 ([id number]) invasive carcinoma. Left breast wide excision with sentinel nodes and axillary contents by Dr Benson Norway 01-03-2004 [phone]) poorly differentiated invasive ductal carcinoma 3.0 cm with closest margin 1.7 cm deep, 6 sentinel nodes + 3 additional nodes all negative, ER 0, PR 0, HER 2 3+, Ki67 of 19%. . She received adjuvant chemotherapy by Dr Sonny Dandy with 5FU, epirubicin, cytoxan x 4 cycles from 02-13-2004 thru 04-16-2004, followed by taxol 80 mg/m2 weekly x 11 from 05-07-2004 thru 09-04-2004, and herceptin 05-21-2004 thru 02-25-2005. Antiemetics were Kytril and decadron. She had PAC for the chemotherapy. She had radiation by Dr Tyler Pita in [place]. Last note from Dr Sonny Dandy in this information was 04-2008. Left diagnostic mammogram [facility] 10-25-2014 stable compared with 12-06-13 and priors; bilateral at [facility] 02-13-15. Breast MRI 05-25-15 had no evidence of malignancy there, this medically necessary with BRCA 2 mutation.   Objective:  Vital signs in last 24 hours:  BP 103/61 mmHg  Pulse 108  Temp(Src) 98.4 F (36.9 C) (Oral)  Resp  18  Ht 5' 2"  (1.575 m)  Wt 114 lb 14.4 oz (52.118 kg)  BMI 21.01 kg/m2  SpO2 97% Weight down 14 pounds.  Alert, oriented and appropriate. In a wheelchair today. Looks fatigued and mildly uncomfortable but not in acute distress. Respirations not labored. A little pale, not icteric  HEENT:PERRL, sclerae not icteric. Oral mucosa moist without lesions, posterior pharynx clear.  Neck supple. No JVD.  Lymphatics:no supraclavicular or axillary adenopathy Resp: clear to auscultation bilaterally and normal percussion bilaterally Cardio: regular rate and rhythm.  No gallop. GI: soft, nontender, distended tightly in upper abdomen especially. Some bowel sounds.  Musculoskeletal/ Extremities: without pitting edema, cords, tenderness Neuro: no change peripheral neuropathy. Otherwise nonfocal. PSYCH appropriate mood and affect Skin without rash, ecchymosis, petechiae Portacath-without erythema or tenderness  Lab Results:  Results for orders placed or performed in visit on 01/07/16  CBC with Differential  Result Value Ref Range   WBC 6.6 3.9 - 10.3 10e3/uL   NEUT# 3.7 1.5 - 6.5 10e3/uL   HGB 9.8 (L) 11.6 - 15.9 g/dL   HCT 29.5 (L) 34.8 - 46.6 %   Platelets 535 (H) 145 - 400 10e3/uL   MCV 89.2 79.5 - 101.0 fL   MCH 29.5 25.1 - 34.0 pg   MCHC 33.1 31.5 - 36.0 g/dL   RBC 3.31 (L) 3.70 - 5.45 10e6/uL   RDW 15.5 (H) 11.2 - 14.5 %   lymph# 1.9 0.9 - 3.3 10e3/uL   MONO# 1.0 (H) 0.1 - 0.9 10e3/uL   Eosinophils Absolute 0.0 0.0 - 0.5 10e3/uL   Basophils Absolute 0.0 0.0 - 0.1 10e3/uL   NEUT% 56.4 38.4 - 76.8 %   LYMPH% 28.5 14.0 - 49.7 %   MONO% 14.6 (H) 0.0 - 14.0 %   EOS% 0.0 0.0 - 7.0 %   BASO% 0.5 0.0 - 2.0 %  Comprehensive metabolic panel  Result Value Ref Range   Sodium 130 (L) 136 - 145 mEq/L   Potassium 3.3 (L) 3.5 - 5.1 mEq/L   Chloride 86 (L) 98 - 109 mEq/L   CO2 35 (H) 22 - 29 mEq/L   Glucose 116 70 - 140 mg/dl   BUN 36.3 (H) 7.0 - 26.0 mg/dL   Creatinine 2.0 (H) 0.6 - 1.1 mg/dL   Total Bilirubin <0.30 0.20 - 1.20 mg/dL   Alkaline Phosphatase 73 40 - 150 U/L   AST 21 5 - 34 U/L   ALT 19 0 - 55 U/L   Total Protein 6.2 (L) 6.4 - 8.3 g/dL   Albumin 2.7 (L) 3.5 - 5.0 g/dL   Calcium 8.6 8.4 - 10.4 mg/dL   Anion Gap 9 3 - 11 mEq/L   EGFR 29 (L) >90 ml/min/1.73 m2     Studies/Results:  No results found.  Medications: I have reviewed the patient's current medications. Encouraged patient to take miralax daily to keep bowels moving; if unable to tolerate miralax, should change to SenokotS or equivalent ~ 2 tablets bid.  Potassium  level is low and I have asked her to resume her oral potassium. Not eating or drinking well and creatinine is up to 2.0 and BUN is elevated at 36.3. 1 L of normal saline will be given at our office today. Chemotherapy will be held due to patient's poor functional status as well as per her request.  DISCUSSION For paracentesis later this week.  Hold chemotherapy as outlined above. Canceled appointment for Granix next injection tomorrow. Referral to hospice rocking [place] per the  patient request. Ultrasound of the left lower extremity to rule out DVT. Keep follow-up appointment for next week.  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 Rapid recurrence/ progression of gyn cancer within 4 weeks of completing that chemo. Paracentesis confirmed serous malignancy. Responded initially to gemzar but progression by early March 2017. Olaparib begun 10-25-15 at 808 mg bid, complicated by marked fatigue and GI side effects, unable to tolerate even at much reduced dosing thru 12-01-15. Rapid recurrence of ascites, multiple paracenteses since 11-08-15. Changed therapy to topotecan day 1 cycle 1 on 12-03-15, plan weekly x3 every 28 days, day 15 cycle 1 held with neutropenia and thrombocytopenia. Chemotherapy held today due to poor functional status and patient request. IV fluids for hydration. Hospice referral per her request. 2.BRCA 2 mutation. PARP inhibitor olaparib not tolerated. 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 BRCA 2+. Bilateral mammograms at [facility] done 02-13-15, breast MRI done 05-25-15, no evidence of  active malignancy then. 4.PAC in 5.chemo and iron deficiency anemia: continue oral iron as she can tolerate 6.Protein calorie malnutrition: early satiety with ascites, suboptimal nutrition choices, needs to use [employer] etc. followed by dietitian. 7.RBBB and LAFB, HTN, known to Dr Acie Fredrickson. Previous epirubicin, some cardiac exposure from left breast radiation. 8.hypothyroidism on replacement by PCP, recently seen 9.post parathyroidectomy 2005 10.insurance concerns: Medicaid application still pending, [place] managed care following up. I do not expect that we could get home nursing with no insurance (considering peritoneal drain). 11..taxol peripheral neuropathy somewhat improved, continues gabapentin prn at hs 12..advance directives in place 13.hypokalemia: improved with increase in diet now 14. Left lower extremity edema. Doppler ultrasound later this week to rule out DVT.   All questions answered and she knows to call if needed prior to next scheduled visit.  Time spent 30 min including >50% counseling and coordination of care.     Mikey Bussing, NP   01/07/2016, 4:35 PM

## 2016-01-07 NOTE — Progress Notes (Signed)
Met with patient in my office who brought some medical bills which she needed assistance with and inquire about Granix assistance. Advised patient myself and Raquel were working with the Granix assistance and if additional documentation is needed(Medicaid denial), patient and I can reach out to DSS worker to see if she can email it to me to send for Drug Replacement. Patient verbalized understanding. Advised patient I contacted Patient Accounting to have a bill on the hospital side adjusted due to [place] in place during that date of service.Spoke with Ulice Dash at [phone] who handled this and now her balance is 0. I have her medical bills to submit to Medicaid once she gets more bills for the balance. Will hold off on [facility] to have balance to send for Medicaid. Patient will come to my office again on Wed 11/09/15.

## 2016-01-07 NOTE — Telephone Encounter (Signed)
lvm fo rpt regarding to 6.21 appt....pt ok and aware

## 2016-01-07 NOTE — Patient Instructions (Signed)

## 2016-01-07 NOTE — Patient Instructions (Signed)

## 2016-01-08 ENCOUNTER — Encounter: Payer: Self-pay | Admitting: Oncology

## 2016-01-08 ENCOUNTER — Ambulatory Visit: Payer: Self-pay

## 2016-01-08 ENCOUNTER — Telehealth: Payer: Self-pay

## 2016-01-08 NOTE — Progress Notes (Signed)
Received approval for Granix via fax. Gave copy to Raquel and placed a copy on Dr.Livesay's desk.

## 2016-01-08 NOTE — Telephone Encounter (Signed)
Made hospice referral. Dr. Marko Plume to be attending physician. DNR status needs to be addressed.  Kathleen Bond states that their hospice only covers 2 paracentesis once patient admitted to their services.  Pt for paracentesis 01-09-16 as well as doppler study of Lower extremeties per Kathleen Bond 01-07-16 after visit yesterday.  Kathleen Bond mentioned placement of a Pleurx Cath.  Hospice can go out to the house and drain abdomen as needed. Told Kathleen Bond that this information would be forwarded to Dr. Marko Plume as Kathleen Bond has an appointment on Monday 01-14-16. Faxed recent records and Demographic information to [facility] at [phone]. Told Kathleen Bond that Kathleen Bond took care of [name]' breast cancer years ago.

## 2016-01-08 NOTE — Progress Notes (Signed)
Stephanie from [facility] drug assistance called and needed documentation re-faxed for possible assistance with Granix. She also called [place] to confirm patient was denied for Medicaid due to being over-income. Fax sent and received per confirmation sheet. Colletta [place] will try to expedite and call me back later this afternoon or in the morning with decision. Advised Raquel of this update.

## 2016-01-08 NOTE — Progress Notes (Signed)
Per [facility] patient approved for granix asst for 12 months.  Sent to medical records

## 2016-01-09 ENCOUNTER — Ambulatory Visit (HOSPITAL_BASED_OUTPATIENT_CLINIC_OR_DEPARTMENT_OTHER)
Admission: RE | Admit: 2016-01-09 | Discharge: 2016-01-09 | Disposition: A | Discharge status: Home / Self Care | Payer: Self-pay | Source: Ambulatory Visit | Attending: Oncology | Admitting: Oncology

## 2016-01-09 ENCOUNTER — Ambulatory Visit (HOSPITAL_COMMUNITY)
Admission: RE | Admit: 2016-01-09 | Discharge: 2016-01-09 | Disposition: A | Discharge status: Home / Self Care | Payer: Self-pay | Source: Ambulatory Visit | Attending: Oncology | Admitting: Oncology

## 2016-01-09 DIAGNOSIS — R188 Other ascites: Secondary | ICD-10-CM | POA: Insufficient documentation

## 2016-01-09 DIAGNOSIS — T451X5A Adverse effect of antineoplastic and immunosuppressive drugs, initial encounter: Secondary | ICD-10-CM

## 2016-01-09 DIAGNOSIS — R180 Malignant ascites: Secondary | ICD-10-CM

## 2016-01-09 DIAGNOSIS — R112 Nausea with vomiting, unspecified: Secondary | ICD-10-CM

## 2016-01-09 NOTE — Progress Notes (Signed)
VASCULAR LAB PRELIMINARY  PRELIMINARY  PRELIMINARY  PRELIMINARY  Left lower extremity venous duplex completed.    Preliminary report: Left:  No evidence of DVT, superficial thrombosis, or Baker's cyst.  Margeart Allender, RVS 01/09/2016, 12:16 PM

## 2016-01-09 NOTE — Procedures (Signed)
Ultrasound-guided  therapeutic paracentesis performed yielding 2.4 liters of blood-tinged fluid. No immediate complications.

## 2016-01-13 ENCOUNTER — Other Ambulatory Visit: Payer: Self-pay | Admitting: Oncology

## 2016-01-14 ENCOUNTER — Other Ambulatory Visit: Payer: Self-pay

## 2016-01-14 ENCOUNTER — Ambulatory Visit: Payer: Self-pay

## 2016-01-14 ENCOUNTER — Telehealth: Payer: Self-pay | Admitting: Oncology

## 2016-01-14 ENCOUNTER — Ambulatory Visit (HOSPITAL_BASED_OUTPATIENT_CLINIC_OR_DEPARTMENT_OTHER): Payer: Self-pay | Admitting: Oncology

## 2016-01-14 ENCOUNTER — Other Ambulatory Visit (HOSPITAL_BASED_OUTPATIENT_CLINIC_OR_DEPARTMENT_OTHER): Payer: Self-pay

## 2016-01-14 ENCOUNTER — Telehealth: Payer: Self-pay | Admitting: *Deleted

## 2016-01-14 ENCOUNTER — Ambulatory Visit (HOSPITAL_BASED_OUTPATIENT_CLINIC_OR_DEPARTMENT_OTHER): Payer: Self-pay

## 2016-01-14 ENCOUNTER — Encounter: Payer: Self-pay | Admitting: Oncology

## 2016-01-14 VITALS — BP 88/68 | HR 52 | Temp 98.4°F | Resp 17 | Ht 62.0 in | Wt 111.7 lb

## 2016-01-14 DIAGNOSIS — Z1509 Genetic susceptibility to other malignant neoplasm: Principal | ICD-10-CM

## 2016-01-14 DIAGNOSIS — Z66 Do not resuscitate: Secondary | ICD-10-CM

## 2016-01-14 DIAGNOSIS — C561 Malignant neoplasm of right ovary: Secondary | ICD-10-CM

## 2016-01-14 DIAGNOSIS — E46 Unspecified protein-calorie malnutrition: Secondary | ICD-10-CM

## 2016-01-14 DIAGNOSIS — R180 Malignant ascites: Secondary | ICD-10-CM

## 2016-01-14 DIAGNOSIS — Z95828 Presence of other vascular implants and grafts: Secondary | ICD-10-CM

## 2016-01-14 DIAGNOSIS — C562 Malignant neoplasm of left ovary: Secondary | ICD-10-CM

## 2016-01-14 DIAGNOSIS — Z1501 Genetic susceptibility to malignant neoplasm of breast: Secondary | ICD-10-CM

## 2016-01-14 DIAGNOSIS — E039 Hypothyroidism, unspecified: Secondary | ICD-10-CM

## 2016-01-14 LAB — CBC WITH DIFFERENTIAL/PLATELET
BASO%: 0.1 % (ref 0.0–2.0)
BASOS ABS: 0 10*3/uL (ref 0.0–0.1)
EOS%: 0 % (ref 0.0–7.0)
Eosinophils Absolute: 0 10*3/uL (ref 0.0–0.5)
HEMATOCRIT: 30.1 % — AB (ref 34.8–46.6)
HGB: 10.2 g/dL — ABNORMAL LOW (ref 11.6–15.9)
LYMPH#: 1.6 10*3/uL (ref 0.9–3.3)
LYMPH%: 21.2 % (ref 14.0–49.7)
MCH: 29.3 pg (ref 25.1–34.0)
MCHC: 33.9 g/dL (ref 31.5–36.0)
MCV: 86.5 fL (ref 79.5–101.0)
MONO#: 0.6 10*3/uL (ref 0.1–0.9)
MONO%: 8.2 % (ref 0.0–14.0)
NEUT#: 5.3 10*3/uL (ref 1.5–6.5)
NEUT%: 70.5 % (ref 38.4–76.8)
PLATELETS: 365 10*3/uL (ref 145–400)
RBC: 3.48 10*6/uL — ABNORMAL LOW (ref 3.70–5.45)
RDW: 16.1 % — ABNORMAL HIGH (ref 11.2–14.5)
WBC: 7.5 10*3/uL (ref 3.9–10.3)

## 2016-01-14 LAB — COMPREHENSIVE METABOLIC PANEL
ALBUMIN: 2.7 g/dL — AB (ref 3.5–5.0)
ALK PHOS: 67 U/L (ref 40–150)
ALT: 14 U/L (ref 0–55)
AST: 21 U/L (ref 5–34)
Anion Gap: 12 mEq/L — ABNORMAL HIGH (ref 3–11)
BUN: 54.7 mg/dL — AB (ref 7.0–26.0)
CALCIUM: 8.5 mg/dL (ref 8.4–10.4)
CHLORIDE: 81 meq/L — AB (ref 98–109)
CO2: 35 mEq/L — ABNORMAL HIGH (ref 22–29)
CREATININE: 3 mg/dL — AB (ref 0.6–1.1)
EGFR: 18 mL/min/{1.73_m2} — ABNORMAL LOW (ref >90)
GLUCOSE: 118 mg/dL (ref 70–140)
Potassium: 3.5 mEq/L (ref 3.5–5.1)
Sodium: 128 mEq/L — ABNORMAL LOW (ref 136–145)
Total Bilirubin: <0.3 mg/dL (ref 0.20–1.20)
Total Protein: 6.1 g/dL — ABNORMAL LOW (ref 6.4–8.3)

## 2016-01-14 MED ORDER — SODIUM CHLORIDE 0.9 % IJ SOLN
10.0000 mL | INTRAMUSCULAR | Status: DC | PRN
  Administered 2016-01-14: 10 mL via INTRAVENOUS
  Filled 2016-01-14: qty 10

## 2016-01-14 NOTE — Progress Notes (Signed)
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 She progressed on initial carbo taxol, progressed on gemzar, was unable to tolerate [place] She had topotecan x 2 with dose reductions, last 12-31-15. She has become progressively more weak and is requiring large volume paracenteses ~ weekly. PO intake is very poor, with early satiety. She is continuously thirsty with the third spacing of fluid. She has increased swelling LLE> RLE, LLE venous doppler negative for clot on 01-09-16.  Patient is now in agreement with referral to Hospice, as symptomatic management likely to be of most benefit now. Referral has been made to [facility], which I have confirmed by phone with them now. Patient tells me that first visit by Hospice is to be set up after this MD visit.  Note from RN that Hospice is glad to manage peritoneal drain. Discussed with patient again now and have scheduled placement of peritoneal drain with IR for 01-17-16 as their first available.   Patient is generally weaker and uncomfortable again from abdominal distension with ascites. She denies frank pain, does have tramadol available tho has not used this recently. She is not vomiting. She denies fever. She is not SOB with limited exertion now. LE are not painful with the swelling. She has no bleeding. No problems with PAC. She is voiding. Bowels are moving a little.  Remainder of 10 point Review of Systems negative.  PAC  BRCA 2 mutation ([id number]*) by [employer] 10-12-14 CA 125 on 11-26-15 399  ONCOLOGIC HISTORY  Left ovarian cancer: Patient became symptomatic with abdominal swelling and  constipation in late Nov 2015, seen and treated initially for what was thought to be ulcer. Symptoms continued, with early satiety causing poor po intake; she was seen again at [facility] ED in early Jan 2016, with CT reportedly consistent with stage IIIC or IV gyn cancer, including omental caking, large pelvic mass, para-aortic adenopathy, ascites, pleural effusions and right subpleural nodule. She was seen in consultation by Dr Denman George on 07-25-14, with concern from her exam and scan for rectal involvement. 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 She had paracentesis of 3.4 liters ascites on 07-27-2014, with cytology ([id number]) showing serous carcinoma consistent with gyn primary, ER negative. She had right thoracentesis for 350 cc on 08-11-14, this requested by anesthesia prior to surgery and apparently not sent for cytology. Patient was more comfortable after paracentesis, but could not tell any difference in breathing after thoracentesis (denies SOB prior to that procedure). CA 125 on 08-02-14 was 1162. She was taken to exploratory laparotomy by Dr Denman George on 08-15-14, with 4 liters of ascites removed and biopsy of omentum obtained, with additional debulking not attempted due to surgical findings of entire peritoneal surface replaced by tumor plaque, small intestine coated with tumor and mesentery tethered with tumor nodules, ileum adherent to pelvic mass, dense plaque of tumor on right diaphragm and 10 cm omental cake in LUQ. Pathology of omental biopsy She needed another paracentesis by IR for 1.3 liters ascites on 08-17-14, and had PAC placed by IR also 08-17-14. Day 1 cycle 1 dose dense carboplatin taxol was given in hospital on 08-18-14. After 2 cycles of  dose dense regimen, the carboplatin and taxol was changed to every 3 week regimen beginning cycle 3; she had cycle 5 on 11-09-14, with gCSF support. She had interval debulking by Dr Denman George on 11-28-14.  Surgery was exploratory laparotomy with TAH/ BSO, omentectomy and radical tumor debulking; R1 resection with residual milial tumor on small bowel at completion of procedure. Tumor involved bilateral adnexa, uterine serosa, omentum and falciform ligament, with + LVSI, no nodes examined. Maximum tumor size was 4.3 cm involving left ovary. She resumed carboplatin Taxol on 12-21-14, total of 3 cycles thru 02-01-15 (= 3 cycles after interval debulking). Restaging CT CAP 03-12-15 had some changes concerning for residual or recurrent disease, and CA 125 was elevated to 64 on 03-14-15, having been 6 in July 2016.  Review of prior radiation and chemotherapy (for left breast cancer) significant for use of epirubicin and some unquantified cardiac exposure to radiation. She began gemcitabine 04-09-15. She was neutropenic by 04-16-15. Regimen was changed to every other week gemzar with neulasta support, gemzar dose also decreased beginning cycle 2. CA 125 on 04-09-15 baseline for gemzar 313, down to 27 in Jan 2017, then 35 in Feb and 39 early Jan 2017. She continued gemzar thru 09-24-15. BRCA 2 abnormality identified 10-12-14. Patient took Lynparza at 400 mg bid from 4-6 thru 10-28-15, with diarrhea. She took one dose of 300 mg on 10-29-15 with nausea and vomiting. [employer] dropped to 1.1 on 11-05-15, but recovered on brief treatment break. She tolerated the Lynparza at 100 mg bid x 5 doses from 4-26 thru 11-16-15 using antiemetic premedication. By 4-28 she was again very uncomfortable from ascites, stopping the [place] and taking almost nothing po. Dose was decreased further to 100 mg bid tried 5-3 thru 11-23-15, with marked fatigue and poor po intake. Situation has been complicated by rapidly recurring large volume ascites, with paracentesis for 2.9 liters on 11-08-15, 2.5 liters on 11-19-15, 2.7 liters on 11-29-15 etc. She had day 1 cycle 1 topotecan on 12-03-15 and day 8 on 5-22, then neutropenic and thrombocytopenic by day 15. Chemotherapy  discontinued after day 1 cycle 2 topotecan due to poor performance status. Care transitioned to Hospice.    Breast cancer: Left breast biopsy 11-22-2003 ([id number]) invasive carcinoma. Left breast wide excision with sentinel nodes and axillary contents by Dr Benson Norway 01-03-2004 [phone]) poorly differentiated invasive ductal carcinoma 3.0 cm with closest margin 1.7 cm deep, 6 sentinel nodes + 3 additional nodes all negative, ER 0, PR 0, HER 2 3+, Ki67 of 19%. . She received adjuvant chemotherapy by Dr Sonny Dandy with 5FU, epirubicin, cytoxan x 4 cycles from 02-13-2004 thru 04-16-2004, followed by taxol 80 mg/m2 weekly x 11 from 05-07-2004 thru 09-04-2004, and herceptin 05-21-2004 thru 02-25-2005. Antiemetics were Kytril and decadron. She had PAC for the chemotherapy. She had radiation by Dr Tyler Pita in [place]. Last note from Dr Sonny Dandy in this information was 04-2008. Left diagnostic mammogram [facility] 10-25-2014 stable compared with 12-06-13 and priors; bilateral at [facility] 02-13-15. Breast MRI 05-25-15 had no evidence of malignancy there, this medically necessary with BRCA 2 mutation.    Objective:  Vital signs in last 24 hours: Patient in [facility] for this visit. BP 88/68 mmHg  Pulse 52  Temp(Src) 98.4 F (36.9 C) (Oral)  Resp 17  Ht 5' 2"  (1.575 m)  Wt 111 lb 11.2 oz (50.667 kg)  BMI 20.43 kg/m2  SpO2 100% Weight is down 10 lbs, tho weights vary with ascites. Alert, oriented and appropriate, looks weak and  mildly uncomfortable. Respirations not labored RA  HEENT:PERRL, sclerae not icteric. Oral mucosa somewhat dry without lesions, posterior pharynx clear.  Neck supple. No JVD.  Lymphatics:no supraclavicular adenopathy Resp: clear to auscultation bilaterally  Cardio: regular rate and rhythm. No gallop. GI: soft, nontender, distended, no mass or organomegaly. Few bowel sounds.  Musculoskeletal/ Extremities: LLE 3+ swelling without weeping, RLE 1-2+, without cords, tenderness. No swelling UE Neuro:  speech fluent, moves all extremities. PSYCH appropriate mood and affect Skin without rash, ecchymosis, petechiae Portacath-without erythema or tenderness  Lab Results:  Results for orders placed or performed in visit on 01/14/16  CBC with Differential  Result Value Ref Range   WBC 7.5 3.9 - 10.3 10e3/uL   NEUT# 5.3 1.5 - 6.5 10e3/uL   HGB 10.2 (L) 11.6 - 15.9 g/dL   HCT 30.1 (L) 34.8 - 46.6 %   Platelets 365 145 - 400 10e3/uL   MCV 86.5 79.5 - 101.0 fL   MCH 29.3 25.1 - 34.0 pg   MCHC 33.9 31.5 - 36.0 g/dL   RBC 3.48 (L) 3.70 - 5.45 10e6/uL   RDW 16.1 (H) 11.2 - 14.5 %   lymph# 1.6 0.9 - 3.3 10e3/uL   MONO# 0.6 0.1 - 0.9 10e3/uL   Eosinophils Absolute 0.0 0.0 - 0.5 10e3/uL   Basophils Absolute 0.0 0.0 - 0.1 10e3/uL   NEUT% 70.5 38.4 - 76.8 %   LYMPH% 21.2 14.0 - 49.7 %   MONO% 8.2 0.0 - 14.0 %   EOS% 0.0 0.0 - 7.0 %   BASO% 0.1 0.0 - 2.0 %  Comprehensive metabolic panel  Result Value Ref Range   Sodium 128 (L) 136 - 145 mEq/L   Potassium 3.5 3.5 - 5.1 mEq/L   Chloride 81 (L) 98 - 109 mEq/L   CO2 35 (H) 22 - 29 mEq/L   Glucose 118 70 - 140 mg/dl   BUN 54.7 (H) 7.0 - 26.0 mg/dL   Creatinine 3.0 (HH) 0.6 - 1.1 mg/dL   Total Bilirubin <0.30 0.20 - 1.20 mg/dL   Alkaline Phosphatase 67 40 - 150 U/L   AST 21 5 - 34 U/L   ALT 14 0 - 55 U/L   Total Protein 6.1 (L) 6.4 - 8.3 g/dL   Albumin 2.7 (L) 3.5 - 5.0 g/dL   Calcium 8.5 8.4 - 10.4 mg/dL   Anion Gap 12 (H) 3 - 11 mEq/L   EGFR 18 (L) >90 ml/min/1.73 m2     Studies/Results:  No results found.  Medications: I have reviewed the patient's current medications.  DISCUSSION Change of care from active treatment to symptom management and support by Hospice discussed. At sister's request I have reviewed treatment and explained that prior treatment course and present status do not suggest additional benefit from other systemic treatment. Sister wonders if she should have second opinion, and I have suggested that Dr Sonny Dandy, who  treated her breast cancer and is now medical director for hospice [facility], may be very glad to review her information and certainly would let them and myself know if he has other recommendations. At conclusion of conversation, patient and sister are both very willing to proceed with Hospice involvement.  Peritoneal drain reviewed and will be placed 01-17-16 as noted. Patient understands that ascites has to be sufficient volume for that procedure and feels that she can tolerate until 6-29.  I have told them that I am glad to see her at any time if needed, but will not have scheduled visits at  this office otherwise given plans for Hospice support at home.   Assessment/Plan: 1.High grade papillary serous left ovarian carcinoma IIIC, BRCA 2+: post interval R1 resection on 11-28-14 after 5 cycles of neoadjuvant chemotherapy, additional 3 cycles of carbo taxol from 12-21-14 thru 02-01-15 , with neulasta support. Rapid recurrence/ progression of gyn cancer within 4 weeks of completing that chemo. Paracentesis confirmed serous malignancy. Responded initially to gemzar but progression by early March 2017. Olaparib begun 10-25-15 at 448 mg bid, complicated by marked fatigue and GI side effects, unable to tolerate even at much reduced dosing thru 12-01-15. Rapid recurrence of ascites, multiple paracenteses since 11-08-15. Changed therapy to topotecan day 1 cycle 1 on 12-03-15, day 15 cycle 1 held with neutropenia and thrombocytopenia. Cycle 2 day 1 6-12 dose reduced, poorly tolerated. PS now too poor and situation not appropriate to continue treatment; appreciate assistance from [facility] with this very sweet lady. 2.BRCA 2 mutation. PARP inhibitor olaparib not tolerated. As similar side effects reported with rucaparib, and with pace of disease, did not try that drug. 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 BRCA 2+. Bilateral mammograms  at [facility] done 02-13-15, breast MRI done 05-25-15, no evidence of active malignancy then. 4.PAC flushed 01-13-16 5.chemo and iron deficiency anemia: continue oral iron as she can tolerate 6.Protein calorie malnutrition: early satiety with ascites, suboptimal nutrition choices, needs to use [employer], nutritionist to see again on 12-31-15 7.RBBB and LAFB, HTN, known to Dr Acie Fredrickson. Previous epirubicin, some cardiac exposure from left breast radiation. 8.hypothyroidism on replacement by PCP, recently seen 9.post parathyroidectomy 2005 10.insurance concerns: Medicaid denied.. 11..taxol peripheral neuropathy somewhat improved, continues gabapentin prn at hs 12..advance directives in place, confirmed with patient now.    All questions answered. Patient and sister understand that they can call this office as well as calling Hospice if needed. I am glad to be attending for Hospice, and particularly appreciate Dr Cinda Quest help from here. Time spent 20 min  Including >50% counseling and coordination of care.     LIVESAY,LENNIS P, MD   01/14/2016, 11:51 AM

## 2016-01-14 NOTE — Telephone Encounter (Signed)
Cancelled apt per pof

## 2016-01-14 NOTE — Telephone Encounter (Signed)
Ms Matthew's sister Apolonio Schneiders left a message stating patient will be staying with her.  [facility] notified of address.  [place], [place] [address]  Apolonio Schneiders phone number [phone]

## 2016-01-14 NOTE — Patient Instructions (Signed)

## 2016-01-15 ENCOUNTER — Ambulatory Visit: Payer: Self-pay

## 2016-01-15 ENCOUNTER — Telehealth: Payer: Self-pay

## 2016-01-15 DIAGNOSIS — Z66 Do not resuscitate: Secondary | ICD-10-CM | POA: Insufficient documentation

## 2016-01-15 NOTE — Telephone Encounter (Signed)
Kathleen Bond called and said IR usually sends pt home with 2 bottles. This will cover the time gap between the placement and hospice supplying the rest.

## 2016-01-15 NOTE — Telephone Encounter (Signed)
-----   Message from Gordy Levan, MD sent at 01/14/2016  5:00 PM EDT ----- Referral made to [facility]. For peritoneal drain by IR on 6-29.  I do not know if we need to get bottles etc or if hospice does that.  thanks

## 2016-01-15 NOTE — Telephone Encounter (Signed)
Olean Ree from [facility] called stating they will order pleurx kits when pt gets her drain placed on 6/29. They ask we set her up with one weeks worth of kits so they can get theirs ordered. lvm with IR to see if they can send her home with 1 or 2 kits based on history of paracentesis about 1/wk.

## 2016-01-15 NOTE — Telephone Encounter (Signed)
S/w assistant and they will call back. The question is about supplying the pt with pleurx drain kits, does [facility] do this or do we need to send rx to [facility].

## 2016-01-16 ENCOUNTER — Other Ambulatory Visit: Payer: Self-pay | Admitting: Radiology

## 2016-01-16 ENCOUNTER — Telehealth: Payer: Self-pay | Admitting: Oncology

## 2016-01-16 NOTE — Telephone Encounter (Signed)
Faxed Dr. Mariana Kaufman office note to Dr. Sonny Dandy

## 2016-01-17 ENCOUNTER — Ambulatory Visit (HOSPITAL_COMMUNITY)
Admission: RE | Admit: 2016-01-17 | Discharge: 2016-01-17 | Disposition: A | Discharge status: Home / Self Care | Payer: Self-pay | Source: Ambulatory Visit | Attending: Oncology | Admitting: Oncology

## 2016-01-17 ENCOUNTER — Encounter (HOSPITAL_COMMUNITY): Payer: Self-pay

## 2016-01-17 ENCOUNTER — Other Ambulatory Visit: Payer: Self-pay | Admitting: Oncology

## 2016-01-17 VITALS — BP 90/66 | HR 90 | Temp 97.9°F | Resp 18 | Wt 112.0 lb

## 2016-01-17 DIAGNOSIS — C569 Malignant neoplasm of unspecified ovary: Secondary | ICD-10-CM | POA: Insufficient documentation

## 2016-01-17 DIAGNOSIS — C562 Malignant neoplasm of left ovary: Secondary | ICD-10-CM

## 2016-01-17 DIAGNOSIS — Z515 Encounter for palliative care: Secondary | ICD-10-CM | POA: Insufficient documentation

## 2016-01-17 DIAGNOSIS — R180 Malignant ascites: Secondary | ICD-10-CM | POA: Insufficient documentation

## 2016-01-17 DIAGNOSIS — Z1509 Genetic susceptibility to other malignant neoplasm: Secondary | ICD-10-CM

## 2016-01-17 DIAGNOSIS — Z1501 Genetic susceptibility to malignant neoplasm of breast: Secondary | ICD-10-CM

## 2016-01-17 LAB — CBC
HCT: 28.4 % — ABNORMAL LOW (ref 36.0–46.0)
Hemoglobin: 9.8 g/dL — ABNORMAL LOW (ref 12.0–15.0)
MCH: 29.7 pg (ref 26.0–34.0)
MCHC: 34.5 g/dL (ref 30.0–36.0)
MCV: 86.1 fL (ref 78.0–100.0)
PLATELETS: 560 10*3/uL — AB (ref 150–400)
RBC: 3.3 MIL/uL — ABNORMAL LOW (ref 3.87–5.11)
RDW: 16.3 % — AB (ref 11.5–15.5)
WBC: 7.2 10*3/uL (ref 4.0–10.5)

## 2016-01-17 LAB — BASIC METABOLIC PANEL
Anion gap: 18 — ABNORMAL HIGH (ref 5–15)
BUN: 74 mg/dL — AB (ref 6–20)
CALCIUM: 7.9 mg/dL — AB (ref 8.9–10.3)
CO2: 31 mmol/L (ref 22–32)
CREATININE: 4.17 mg/dL — AB (ref 0.44–1.00)
Chloride: 78 mmol/L — ABNORMAL LOW (ref 101–111)
GFR calc Af Amer: 12 mL/min — ABNORMAL LOW (ref >60)
GFR, EST NON AFRICAN AMERICAN: 10 mL/min — AB (ref >60)
GLUCOSE: 107 mg/dL — AB (ref 65–99)
Potassium: 3.3 mmol/L — ABNORMAL LOW (ref 3.5–5.1)
SODIUM: 127 mmol/L — AB (ref 135–145)

## 2016-01-17 LAB — PROTIME-INR
INR: 0.95 (ref 0.00–1.49)
PROTHROMBIN TIME: 12.5 s (ref 11.6–15.2)

## 2016-01-17 LAB — APTT: APTT: 34 s (ref 24–37)

## 2016-01-17 MED ORDER — SODIUM CHLORIDE 0.9 % IV SOLN: Freq: Once | INTRAVENOUS | Status: DC

## 2016-01-17 MED ORDER — FENTANYL CITRATE (PF) 100 MCG/2ML IJ SOLN
INTRAMUSCULAR | Status: AC
  Filled 2016-01-17: qty 4

## 2016-01-17 MED ORDER — MIDAZOLAM HCL 2 MG/2ML IJ SOLN
INTRAMUSCULAR | Status: AC | PRN
  Administered 2016-01-17 (×2): 1 mg via INTRAVENOUS

## 2016-01-17 MED ORDER — SODIUM CHLORIDE 0.9 % IV SOLN
INTRAVENOUS | Status: DC
  Administered 2016-01-17: 13:00:00 via INTRAVENOUS

## 2016-01-17 MED ORDER — FENTANYL CITRATE (PF) 100 MCG/2ML IJ SOLN
INTRAMUSCULAR | Status: AC | PRN
  Administered 2016-01-17 (×2): 25 ug via INTRAVENOUS

## 2016-01-17 MED ORDER — HEPARIN SOD (PORK) LOCK FLUSH 100 UNIT/ML IV SOLN
500.0000 [IU] | INTRAVENOUS | Status: AC | PRN
  Administered 2016-01-17: 500 [IU]
  Filled 2016-01-17: qty 5

## 2016-01-17 MED ORDER — MIDAZOLAM HCL 2 MG/2ML IJ SOLN
INTRAMUSCULAR | Status: AC
  Filled 2016-01-17: qty 6

## 2016-01-17 MED ORDER — CEFAZOLIN SODIUM-DEXTROSE 2-4 GM/100ML-% IV SOLN
2.0000 g | INTRAVENOUS | Status: AC
  Administered 2016-01-17: 2 g via INTRAVENOUS
  Filled 2016-01-17: qty 100

## 2016-01-17 NOTE — Progress Notes (Signed)
Patient ID: Kathleen Bond, female   DOB: February 10, 1951, 65 y.o.   MRN: [id number]    Referring Physician(s): Livesay,Lennis P  Supervising Physician: Sandi Mariscal  Patient Status:  Outpatient  Chief Complaint:  Recurrent ascites  Subjective: Pt familiar to IR service from prior right chest wall port a cath 08/17/14 and multiple paracenteses, last of which was on 01/09/16 yielding 2.4 liters. She has a progressive high-grade papillary serous ovarian carcinoma with recurrent malignant ascites. She is currently under hospice care. She presents again today for tunneled peritoneal catheter placement. She currently denies fever, headache, chest pain or abnormal bleeding. She does have occasional dyspnea,/cough, abdominal/back discomfort, nausea /vomiting, weight loss, and fatigue.   Allergies: Review of patient's allergies indicates no known allergies.  Medications: Prior to Admission medications   Medication Sig Start Date End Date Taking? Authorizing Provider  acetaminophen (TYLENOL) 500 MG tablet Take 1,000 mg by mouth every 8 (eight) hours as needed.   Yes Historical Provider, MD  Ascorbic Acid (VITAMIN C) 1000 MG tablet Take 2,000 mg by mouth daily.   Yes Historical Provider, MD  gabapentin (NEURONTIN) 100 MG capsule Take 1 capsule (100 mg total) by mouth at bedtime. For neuropathy symptoms.  Will make drowsy. 05/21/15  Yes Lennis Marion Downer, MD  levothyroxine (SYNTHROID, LEVOTHROID) 150 MCG tablet Take 150 mcg by mouth daily before breakfast.   Yes Historical Provider, MD  LORazepam (ATIVAN) 0.5 MG tablet Take 1 tablet (0.5 mg total) by mouth every 6 (six) hours as needed (nausea/vomitting.). 12/03/15  Yes Lennis P Livesay, MD  ondansetron (ZOFRAN) 8 MG tablet Take 1 tablet (8 mg total) by mouth every 8 (eight) hours as needed for nausea or vomiting. 11/19/15  Yes Lennis Marion Downer, MD  pantoprazole (PROTONIX) 40 MG tablet Take 1 tablet (40 mg total) by mouth daily. 07/17/15  Yes Lennis Marion Downer,  MD  polyethylene glycol (MIRALAX / GLYCOLAX) packet Take 17 g by mouth daily as needed. Reported on 01/14/2016   Yes Historical Provider, MD  prochlorperazine (COMPAZINE) 10 MG tablet Take 1 tablet (10 mg total) by mouth every 6 (six) hours as needed for nausea or vomiting. 12/03/15  Yes Lennis P Livesay, MD  sucralfate (CARAFATE) 1 GM/10ML suspension Take 10 mLs (1 g total) by mouth 4 (four) times daily -  with meals and at bedtime. 05/21/15  Yes Lennis Marion Downer, MD  docusate sodium (COLACE) 100 MG capsule Take 100 mg by mouth daily as needed for mild constipation. Reported on 01/14/2016    Historical Provider, MD  ferrous fumarate (HEMOCYTE) 325 (106 FE) MG TABS tablet Take one tablet by mouth daily on an empty stomach with orange juice. 09/21/14   Lennis Marion Downer, MD  lidocaine-prilocaine (EMLA) cream Apply 1 application topically as needed (to PAC prior to access).    Historical Provider, MD  megestrol (MEGACE) 40 MG tablet Take 2 tablets (80 mg total) by mouth 2 (two) times daily. For appetite 12/10/15   Lennis Marion Downer, MD  traMADol (ULTRAM) 50 MG tablet Take 1-2 tablets (50-100 mg total) by mouth every 6 (six) hours as needed (pain). Patient not taking: Reported on 01/14/2016 09/24/15   Gordy Levan, MD  Vitamin D, Ergocalciferol, (DRISDOL) 50000 UNITS CAPS capsule Take 50,000 Units by mouth every 7 (seven) days.    Historical Provider, MD     Vital Signs: BP 95/68 mmHg  Pulse 109  Temp(Src) 98.1 F (36.7 C) (Oral)  Resp 16  Wt 112 lb (50.803 kg)  SpO2 90%  Physical Exam patient awake, alert; chest- sl dim BS bases; clean, intact rt chest wall PAC; heart- sl tachy but regular; abd- dist,+BS, mild gen tenderness;  trace LLE edema, no RLE edema  Imaging: No results found.  Labs:  CBC:  Recent Labs  12/31/15 1137 01/07/16 1215 01/14/16 1033 01/17/16 1250  WBC 6.9 6.6 7.5 7.2  HGB 11.4* 9.8* 10.2* 9.8*  HCT 34.8 29.5* 30.1* 28.4*  PLT 632* 535* 365 560*    COAGS: No  results for input(s): INR, APTT in the last 8760 hours.  BMP:  Recent Labs  12/24/15 1035 12/31/15 1137 01/07/16 1215 01/14/16 1033  NA 139 137 130* 128*  K 3.6 4.0 3.3* 3.5  CO2 30* 37* 35* 35*  GLUCOSE 111 120 116 118  BUN 16.7 20.9 36.3* 54.7*  CALCIUM 8.6 8.7 8.6 8.5  CREATININE 1.0 1.5* 2.0* 3.0*    LIVER FUNCTION TESTS:  Recent Labs  12/24/15 1035 12/31/15 1137 01/07/16 1215 01/14/16 1033  BILITOT <0.30 <0.30 <0.30 <0.30  AST 13 19 21 21   ALT 9 10 19 14   ALKPHOS 78 77 73 67  PROT 6.2* 6.6 6.2* 6.1*  ALBUMIN 2.9* 2.9* 2.7* 2.7*    Assessment and Plan: Pt with hx of progressive high-grade papillary serous ovarian carcinoma with recurrent malignant ascites. She has undergone several paracenteses. She is currently under hospice care. She presents again today for tunneled peritoneal catheter placement. Details/risks of procedure, including but not limited to , internal bleeding, infection, inability to completely drain fluid, d/w pt/sister with their understanding and consent.    Electronically Signed: D. Rowe Robert 01/17/2016, 1:16 PM   I spent a total of 25 minutes at the the patient's bedside AND on the patient's hospital floor or unit, greater than 50% of which was counseling/coordinating care for tunneled peritoneal catheter placement

## 2016-01-17 NOTE — Discharge Instructions (Signed)
Tunneled Catheter, Care After Refer to this sheet in the next few weeks. These instructions provide you with information on caring for yourself after your procedure. Your caregiver may also give you more specific instructions. Your treatment has been planned according to current medical practices, but problems sometimes occur. Call your caregiver if you have any problems or questions after your procedure.  HOME CARE INSTRUCTIONS  Rest at home the day of the procedure. You will likely be able to return to normal activities the following day.  Follow your caregiver's specific instructions for the type of device that you have.  Only take over-the-counter or prescription medicines as directed by your caregiver.  Keep the insertion site of the catheter clean and dry at all times.  Change the bandages (dressings) over the catheter site as directed by your caregiver.  Wash the area around the catheter site during each dressing change. Sponge bathe the area using a germ-killing (antiseptic) solution as directed by your caregiver.  Look for redness or swelling at the insertion site during each dressing change.  Apply an antibiotic ointment as directed by your caregiver.  Flush your catheter as directed to keep it from becoming clogged.  Always wash your hands thoroughly before changing dressings or flushing the catheter.  Do notlet air enter the catheter.  Never open the cap at the catheter tip.  Always make sure there is no air in the syringe or in the tubing for infusions.   Do notlift anything heavy.  Do not drive until your caregiver approves.  Do not shower or bathe until your caregiver approves. When you shower or bathe, place a piece of plastic wrap over the catheter site. Do not allow the catheter site or the dressing to get wet. If taking a bath, do not allow the catheter to get submerged in the water. If the catheter was inserted through an arm vein:  Avoid wearing tight  clothes or jewelry on the arm that has the catheter.   Do not sleep with your head on the arm that has the catheter.   Do not allow use of a blood pressure cuff on the arm that has the catheter.   Do not let anyone draw blood from the arm that has the catheter, except through the catheter itself. SEEK MEDICAL CARE IF:  You have bleeding at the insertion site of the catheter.   You feel weak or nauseous.   Your catheter is not working properly.   You have redness, pain, swelling, and warmth at the insertion site.   You notice fluid draining from the insertion site.  SEEK IMMEDIATE MEDICAL CARE IF:  Your catheter breaks or has a hole in it.   Your catheter comes loose or gets pulled completely out. If this happens, hold firm pressure over the area with your hand or a clean cloth.   You have a fever.  You have chills.   Your catheter becomes totally blocked.   You have swelling in your arm, shoulder, neck, or face.   You have bleeding from the insertion site that does not stop.   You develop chest pain or have trouble breathing.   You feel dizzy or faint.  MAKE SURE YOU:  Understand these instructions.  Will watch your condition.  Will get help right away if you are not doing well or get worse.   This information is not intended to replace advice given to you by your health care provider. Make sure you discuss any questions  you have with your health care provider.   Document Released: 06/23/2012 Document Revised: 03/09/2013 Document Reviewed: 06/23/2012 Elsevier Interactive Patient Education 2016 [employer]  Moderate Conscious Sedation, Adult, Care After Refer to this sheet in the next few weeks. These instructions provide you with information on caring for yourself after your procedure. Your health care provider may also give you more specific instructions. Your treatment has been planned according to current medical practices, but problems  sometimes occur. Call your health care provider if you have any problems or questions after your procedure. WHAT TO EXPECT AFTER THE PROCEDURE  After your procedure:  You may feel sleepy, clumsy, and have poor balance for several hours.  Vomiting may occur if you eat too soon after the procedure. HOME CARE INSTRUCTIONS  Do not participate in any activities where you could become injured for at least 24 hours. Do not:  Drive.  Swim.  Ride a bicycle.  Operate heavy machinery.  Cook.  Use power tools.  Climb ladders.  Work from a high place.  Do not make important decisions or sign legal documents until you are improved.  If you vomit, drink water, juice, or soup when you can drink without vomiting. Make sure you have little or no nausea before eating solid foods.  Only take over-the-counter or prescription medicines for pain, discomfort, or fever as directed by your health care provider.  Make sure you and your family fully understand everything about the medicines given to you, including what side effects may occur.  You should not drink alcohol, take sleeping pills, or take medicines that cause drowsiness for at least 24 hours.  If you smoke, do not smoke without supervision.  If you are feeling better, you may resume normal activities 24 hours after you were sedated.  Keep all appointments with your health care provider. SEEK MEDICAL CARE IF:  Your skin is pale or bluish in color.  You continue to feel nauseous or vomit.  Your pain is getting worse and is not helped by medicine.  You have bleeding or swelling.  You are still sleepy or feeling clumsy after 24 hours. SEEK IMMEDIATE MEDICAL CARE IF:  You develop a rash.  You have difficulty breathing.  You develop any type of allergic problem.  You have a fever. MAKE SURE YOU:  Understand these instructions.  Will watch your condition.  Will get help right away if you are not doing well or get  worse.   This information is not intended to replace advice given to you by your health care provider. Make sure you discuss any questions you have with your health care provider.   Document Released: 04/27/2013 Document Revised: 07/28/2014 Document Reviewed: 04/27/2013 Elsevier Interactive Patient Education [employer].

## 2016-01-17 NOTE — Progress Notes (Signed)
Spoke with Rowe Robert, PA nodify patient low BP.  Verbal order and read back for 522ml bolus Normal Saline given

## 2016-01-17 NOTE — Procedures (Signed)
Successful placement of a tunneled peritoneal drainage catheter. Large volume paracentesis performed following drain placement. No immediate complications.  Ronny Bacon, MD Pager #: [phone]

## 2016-01-18 ENCOUNTER — Other Ambulatory Visit: Payer: Self-pay | Admitting: *Deleted

## 2016-01-18 DIAGNOSIS — C569 Malignant neoplasm of unspecified ovary: Secondary | ICD-10-CM

## 2016-01-18 DIAGNOSIS — C50912 Malignant neoplasm of unspecified site of left female breast: Secondary | ICD-10-CM

## 2016-01-18 MED ORDER — PANTOPRAZOLE SODIUM 40 MG PO TBEC: 40.0000 mg | DELAYED_RELEASE_TABLET | Freq: Every day | ORAL | Status: AC

## 2016-01-18 MED ORDER — TRAMADOL HCL 50 MG PO TABS: 50.0000 mg | ORAL_TABLET | Freq: Four times a day (QID) | ORAL | Status: AC | PRN

## 2016-01-18 NOTE — Telephone Encounter (Signed)
TC from pt's Hospice nurse, Eustaquio Maize. Requesting refill on Protonix and Tramadol. Tramadol appears to have been stopped or pt not taking as of January 14, 2016 and had not been refilled since March 2017. Do you want to re-order Tramadol? Pt using [facility] for preferred pharmacy now.  Return call to [place], RN with [facility] @ [phone]

## 2016-01-18 NOTE — Telephone Encounter (Signed)
S/w Dr Marko Plume and called in tramadol. protonix already refilled.

## 2016-01-28 ENCOUNTER — Ambulatory Visit: Payer: Self-pay

## 2016-01-28 ENCOUNTER — Encounter: Payer: Self-pay | Admitting: Nutrition

## 2016-01-28 ENCOUNTER — Other Ambulatory Visit: Payer: Self-pay

## 2016-01-28 ENCOUNTER — Ambulatory Visit: Payer: Self-pay | Admitting: Oncology

## 2016-01-28 ENCOUNTER — Telehealth: Payer: Self-pay

## 2016-01-28 NOTE — Telephone Encounter (Signed)
Mailed signed DNR form to [facility].

## 2016-02-04 ENCOUNTER — Other Ambulatory Visit: Payer: Self-pay

## 2016-02-04 ENCOUNTER — Ambulatory Visit: Payer: Self-pay

## 2016-02-08 ENCOUNTER — Telehealth: Payer: Self-pay

## 2016-02-11 ENCOUNTER — Encounter: Payer: Self-pay | Admitting: Oncology

## 2016-02-11 ENCOUNTER — Ambulatory Visit: Payer: Self-pay

## 2016-02-11 ENCOUNTER — Other Ambulatory Visit: Payer: Self-pay

## 2016-02-11 NOTE — Progress Notes (Signed)
Medical Oncology  Per [facility], patient died on 25-Feb-2016 at 0615. Sympathy letter written to family.  Other physicians to be notified by this entry,  their help with care of this wonderful lady was much appreciated.  Godfrey Pick, MD

## 2016-02-19 NOTE — Telephone Encounter (Signed)
Otila Kluver, RN from [facility] called to inform Dr. Marko Plume that Ms Carden Krizman passed away at 0615 this am February 15, 2016

## 2016-02-19 DEATH — deceased

## 2016-02-25 ENCOUNTER — Ambulatory Visit: Payer: Self-pay

## 2016-02-25 ENCOUNTER — Other Ambulatory Visit: Payer: Self-pay

## 2016-02-27 IMAGING — US US PARACENTESIS
1 series · 5 of 5 positions shown · non-contrast
Comparison: None.

MEDICATIONS:
None.

COMPLICATIONS:
None immediate

INDICATION: Patient with remote history of breast cancer; now with bloating,
pelvic mass, retroperitoneal adenopathy, ascites, omental caking.
Request is made for diagnostic and therapeutic paracentesis.

EXAM:
ULTRASOUND-GUIDED DIAGNOSTIC AND THERAPEUTIC PARACENTESIS
TECHNIQUE: Informed written consent was obtained from the patient after a
discussion of the risks, benefits and alternatives to treatment. A
timeout was performed prior to the initiation of the procedure.

[Series 1: us paracentesis · 0.27mm/px · 5 of 5 slices shown]
[im 1/5]
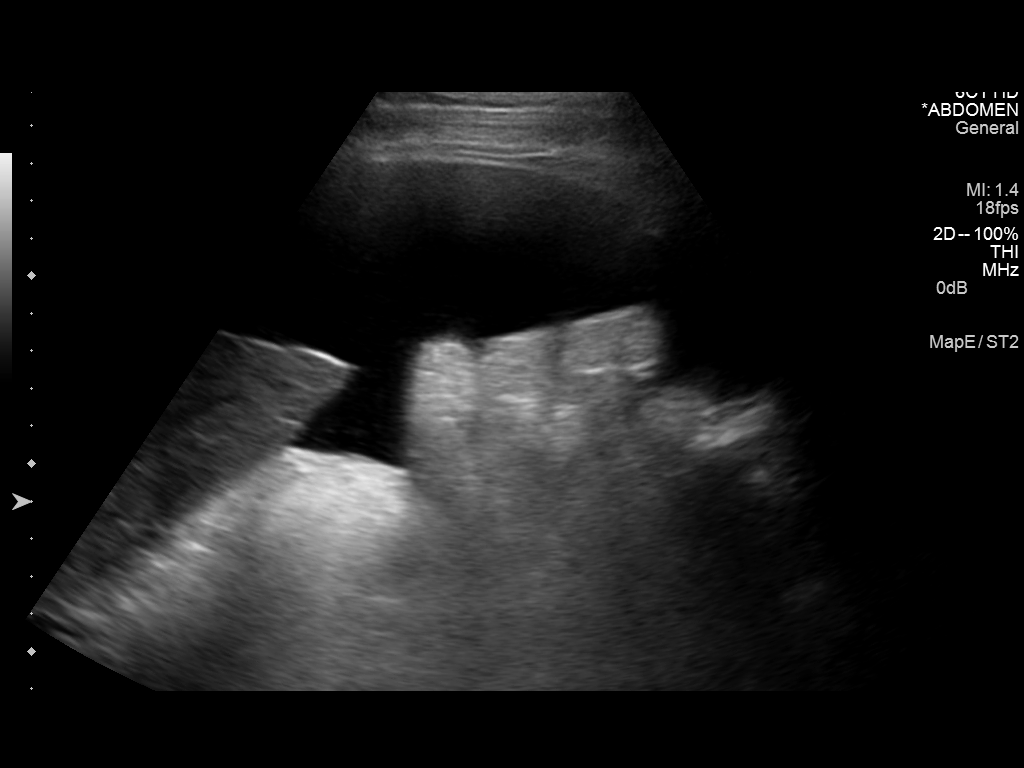
[im 2/5]
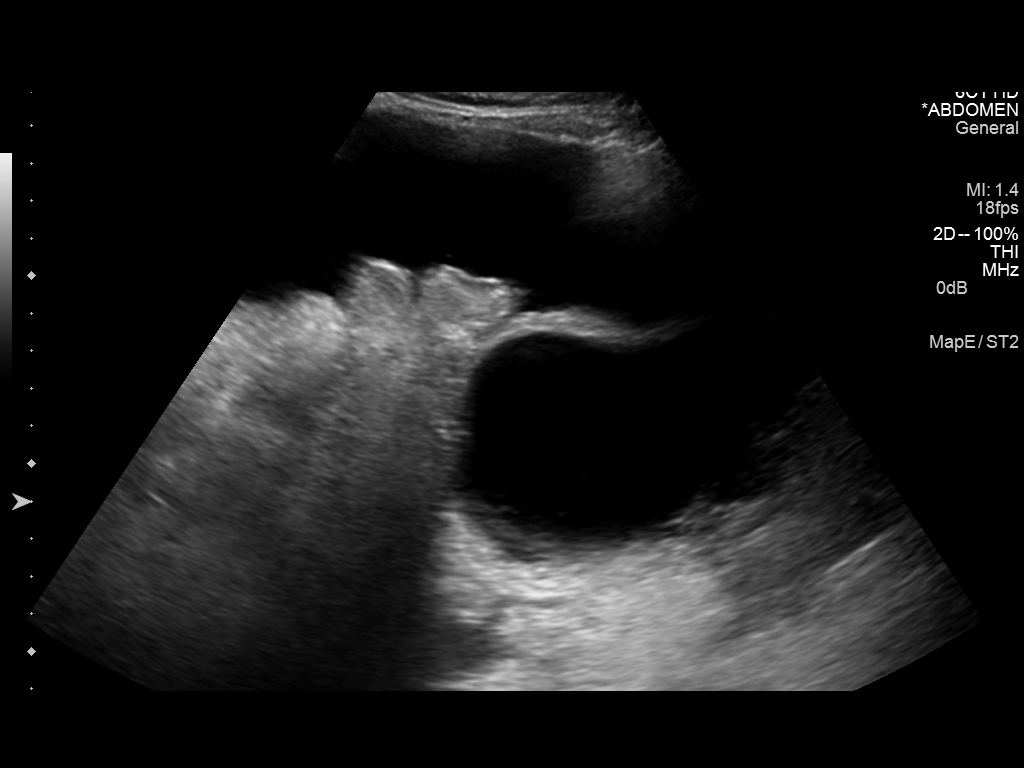
[im 3/5]
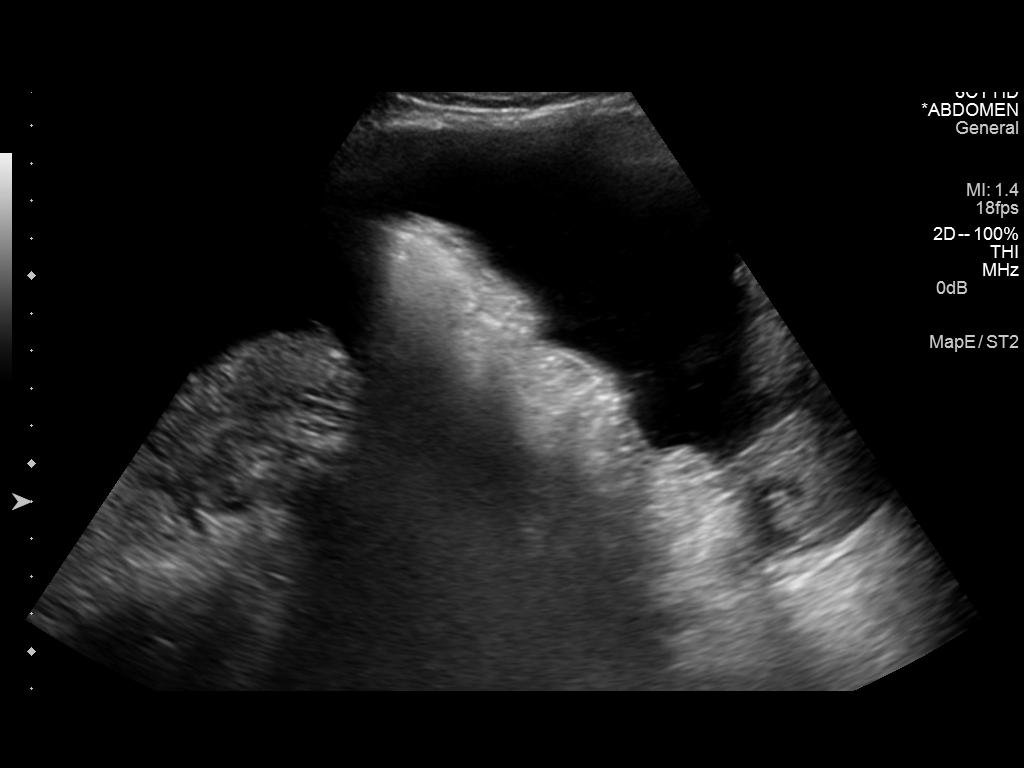
[im 4/5]
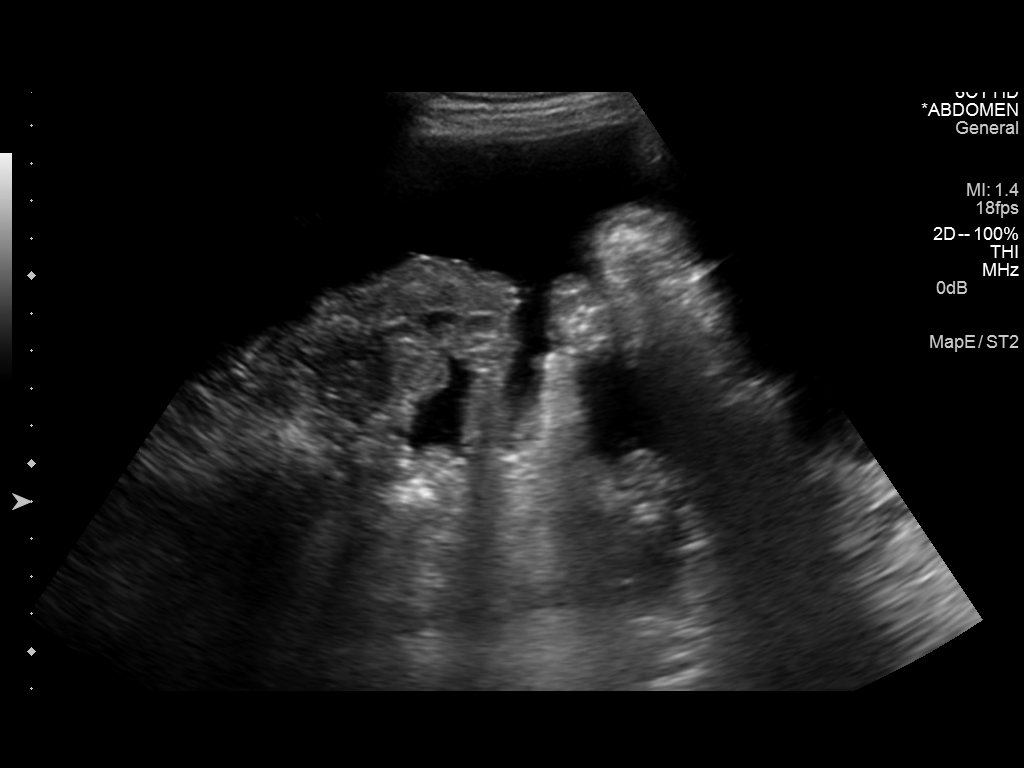
[im 5/5]
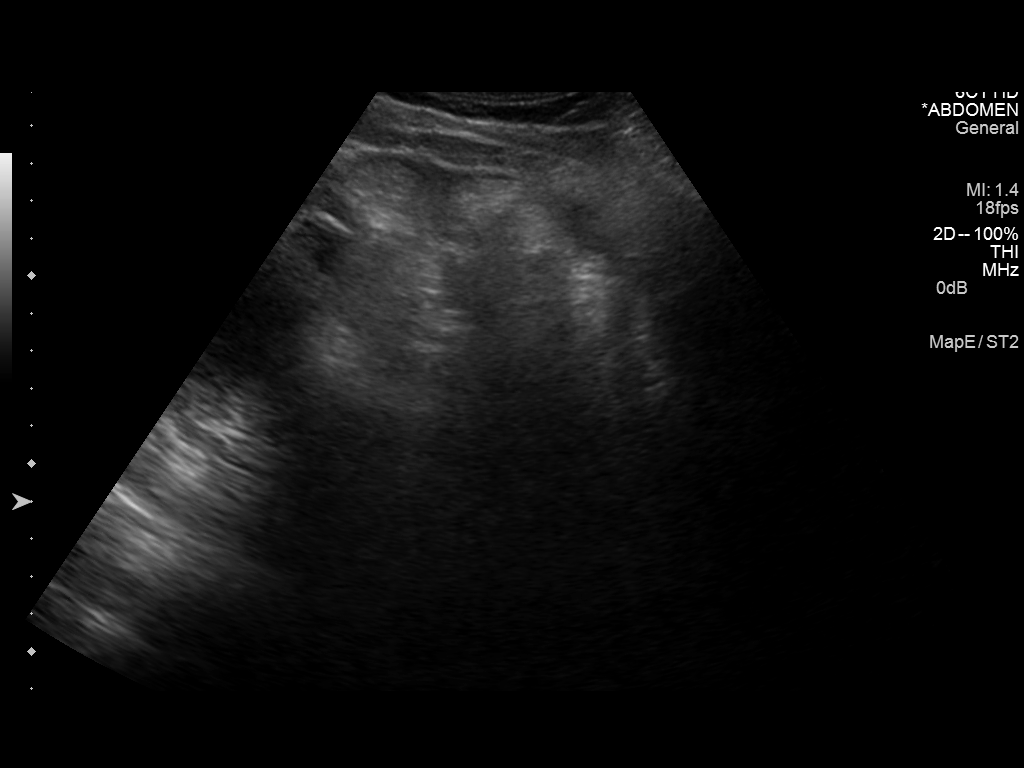

[5 of 5 positions shown; findings below may reference images not displayed]

Initial ultrasound scanning demonstrates a moderate amount of
ascites within the right lower abdominal quadrant. The right lower
abdomen was prepped and draped in the usual sterile fashion. 1%
lidocaine was used for local anesthesia. Under direct ultrasound
guidance, a 19 gauge, 7-cm, Yueh catheter was introduced. An
ultrasound image was saved for documentation purposed. The
paracentesis was performed. The catheter was removed and a dressing
was applied. The patient tolerated the procedure well without
immediate post procedural complication.
FINDINGS: A total of approximately 3.4 liters of yellow fluid was removed.
Samples were sent to the laboratory as requested by the clinical
team.
IMPRESSION: Successful ultrasound-guided diagnostic and therapeutic paracentesis
yielding 3.4 liters of peritoneal fluid.

## 2016-03-13 IMAGING — US US THORACENTESIS ASP PLEURAL SPACE W/IMG GUIDE
1 series · 10 of 10 positions shown · non-contrast
Comparison: Prior chest x-ray 08/02/14;

CLINICAL DATA: 63-year-old female with metastatic ovarian
carcinoma. She has bilateral small to moderate pleural effusions.
Thoracentesis was requested by anesthesiology in prior to surgery.

EXAM:
ULTRASOUND GUIDED RIGHT THORACENTESIS

[Series 1: us thoracentesis asp pleural space w/img guide · 0.22mm/px · 10 of 10 slices shown]
[im 1/10]
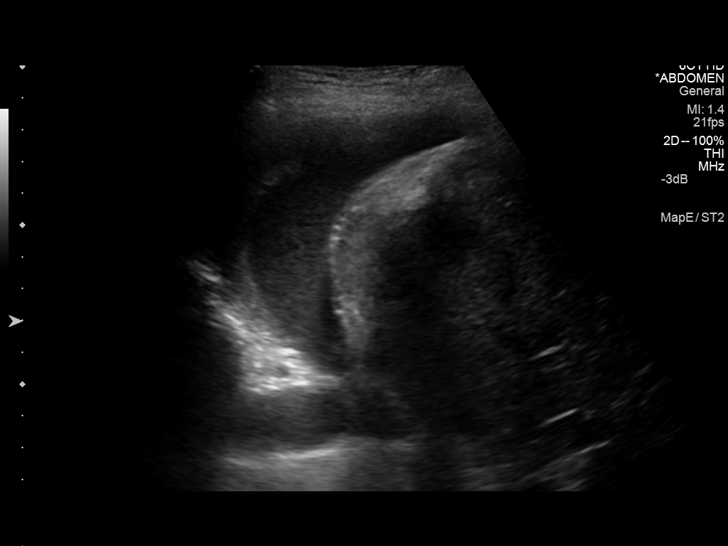
[im 2/10]
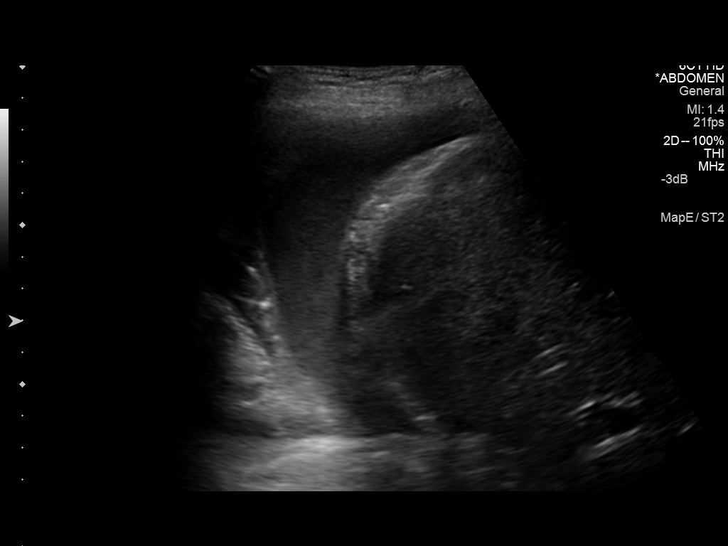
[im 3/10]
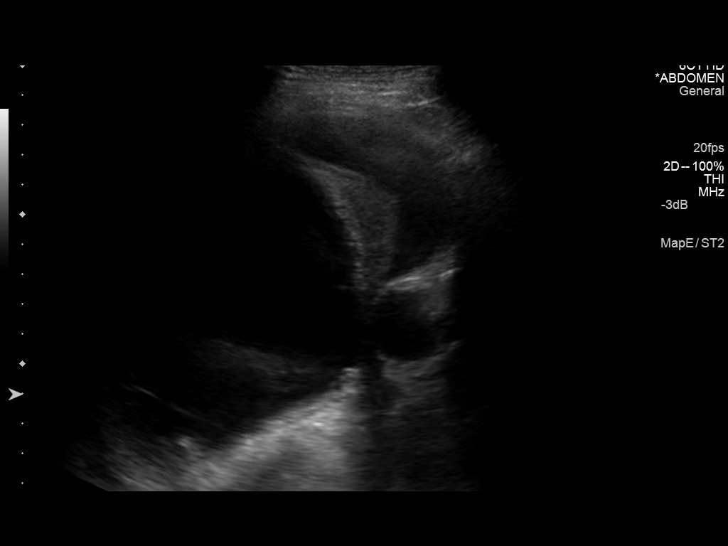
[im 4/10]
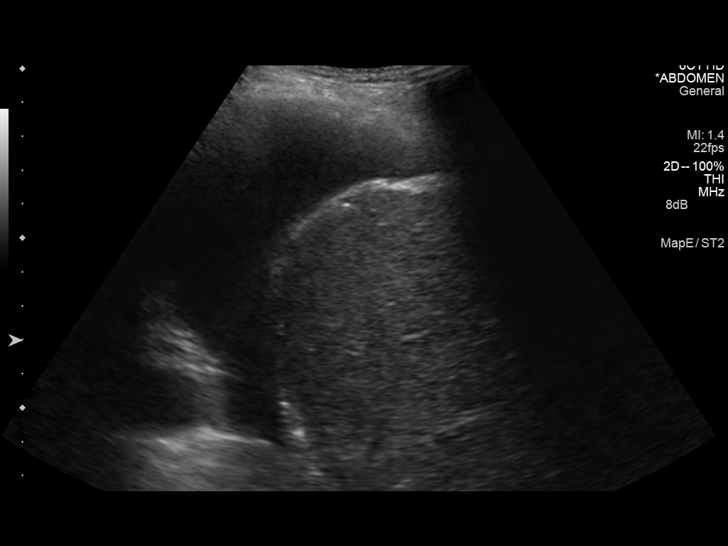
[im 5/10]
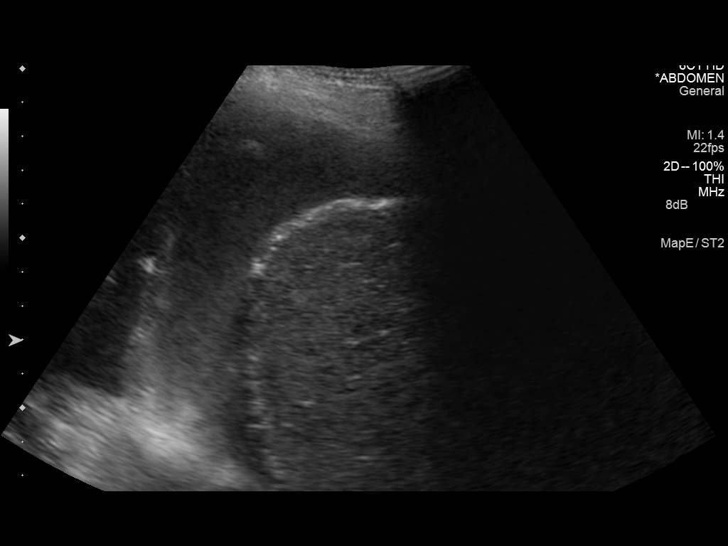
[im 6/10]
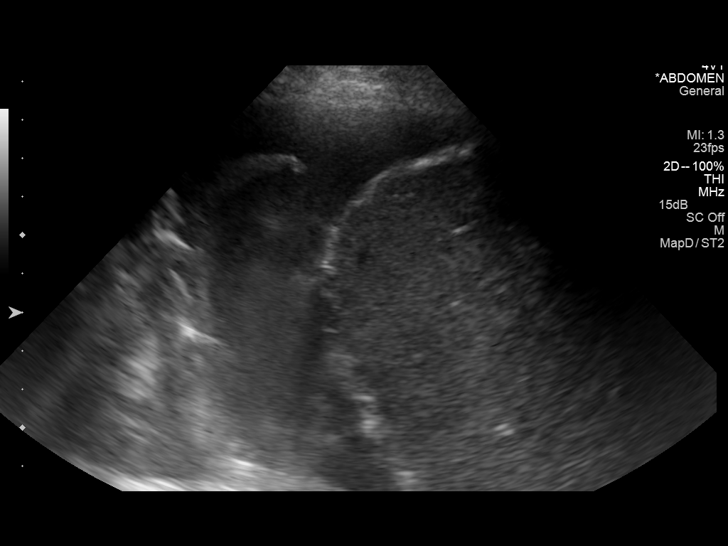
[im 7/10]
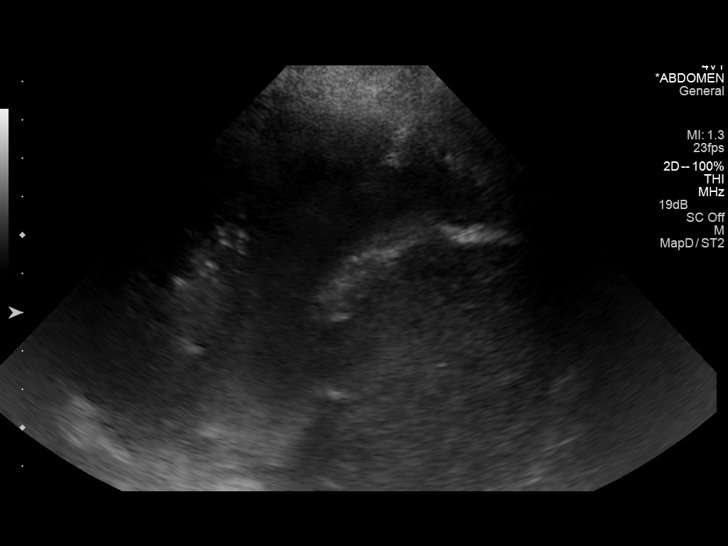
[im 8/10]
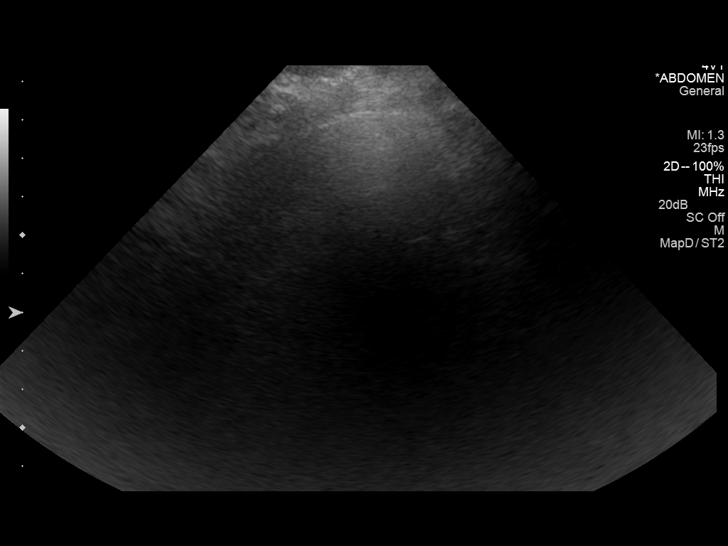
[im 9/10]
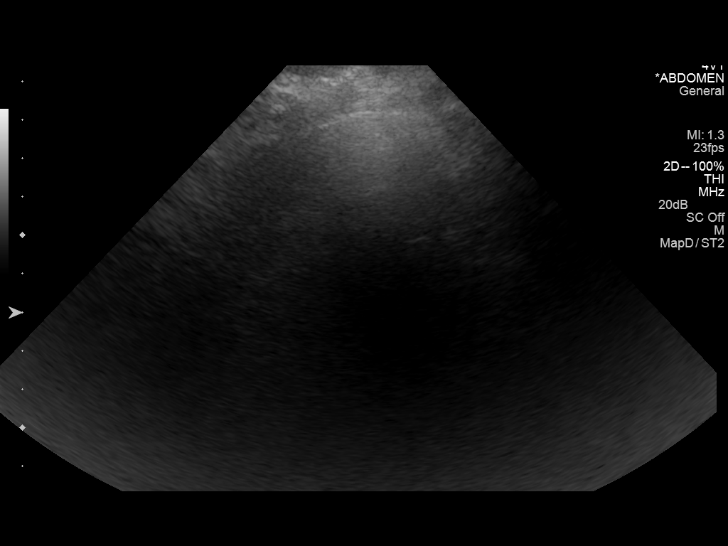
[im 10/10]
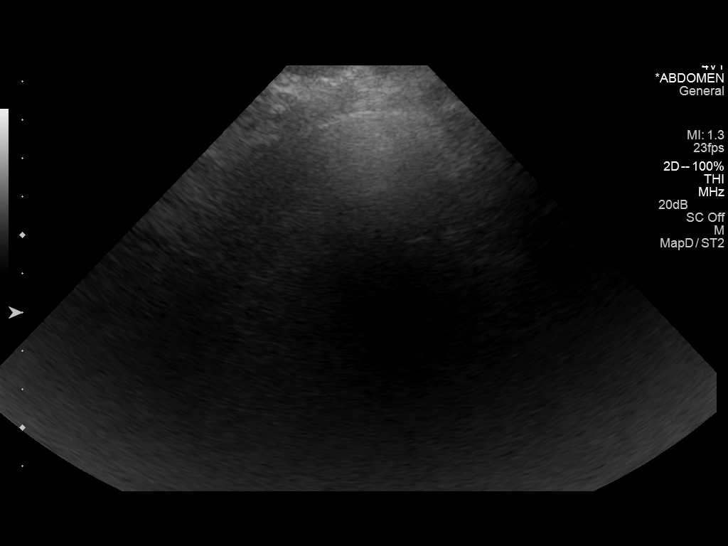

[10 of 10 positions shown; findings below may reference images not displayed]

prior CT scan of the abdomen
and pelvis 07/21/2014

PROCEDURE:
An ultrasound guided thoracentesis was thoroughly discussed with the
patient and questions answered. The benefits, risks, alternatives
and complications were also discussed. The patient understands and
wishes to proceed with the procedure. Written consent was obtained.

Ultrasound was performed to localize and mark an adequate pocket of
fluid in the right chest. The left chest was also interrogated.
There is a left subpulmonic fluid collection however this is not
easily accessible given the overlying lung tissue. The area was then
prepped and draped in the normal sterile fashion. 1% Lidocaine was
used for local anesthesia. Under ultrasound guidance a 19 gauge Yueh
catheter was introduced. Thoracentesis was performed. The catheter
was removed and a dressing applied.

COMPLICATIONS:
None.
FINDINGS: A total of approximately 350 mL of cloudy yellow pleural fluid was
removed. A fluid sample was not sent for laboratory analysis.
IMPRESSION: Successful ultrasound guided right thoracentesis yielding 350 mL of
pleural fluid.

## 2016-03-13 IMAGING — CR DG CHEST 1V
1 series · 1 of 1 positions shown · non-contrast
Comparison: 08/02/2014

CLINICAL DATA: Post thoracentesis on the right.

EXAM:
CHEST - 1 VIEW

[w chest pa]
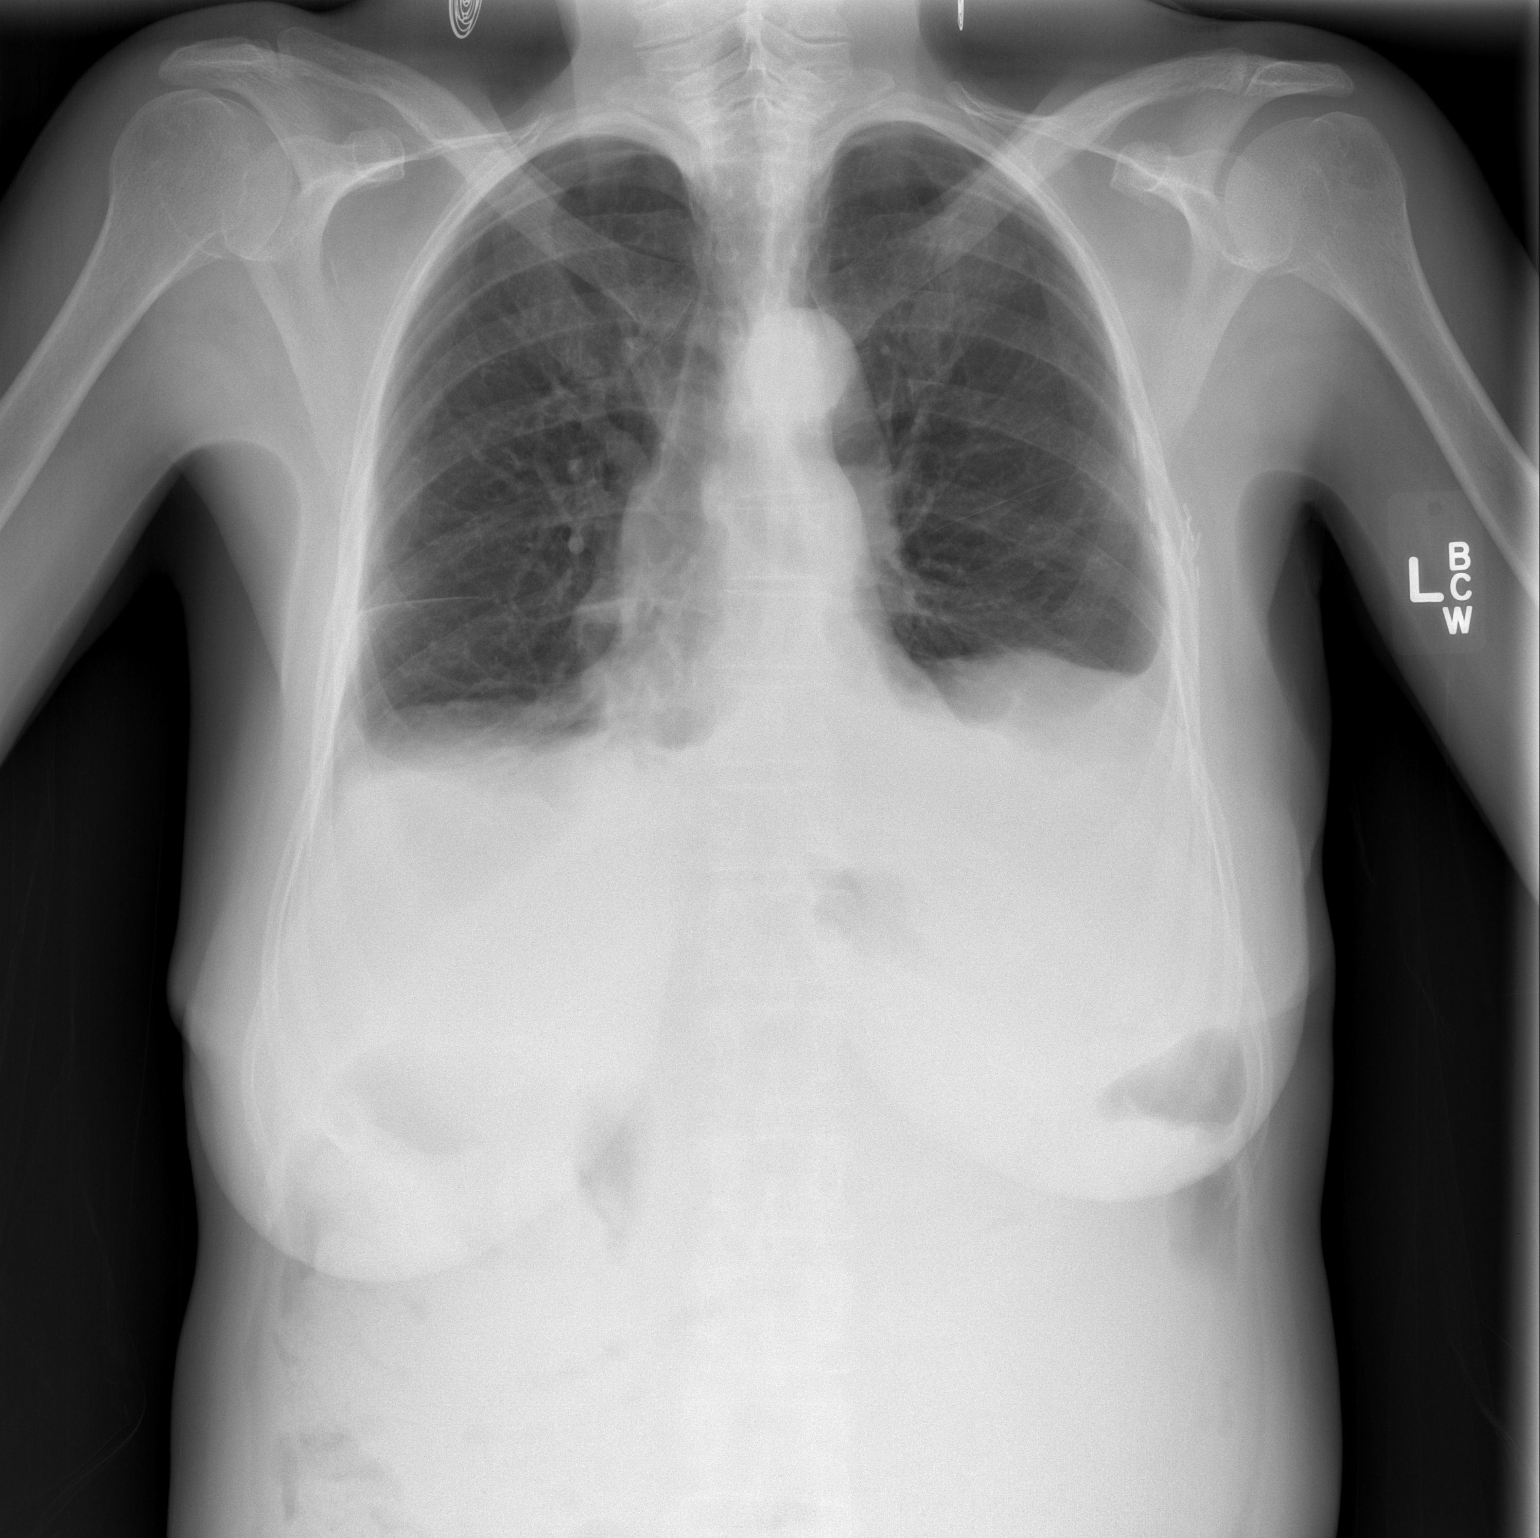

[1 of 1 positions shown; findings below may reference images not displayed]

FINDINGS: No pneumothorax following right thoracentesis. Small pleural
effusions remain, slightly larger on the left. Minor bibasilar
compressive atelectasis. Normal heart size and vascularity. Trachea
is midline. Postop changes in the left axilla.
IMPRESSION: Negative for pneumothorax following right thoracentesis.

Persistent small bilateral pleural effusions with compressive
basilar atelectasis.

## 2016-07-11 ENCOUNTER — Other Ambulatory Visit: Payer: Self-pay | Admitting: Nurse Practitioner

## 2017-07-01 IMAGING — US US PARACENTESIS
1 series · 7 of 7 positions shown · non-contrast
Comparison: none

INDICATION: Ovarian cancer. Recurrent ascites. Request therapeutic paracentesis.

[Series 1: us paracentesis · 0.26mm/px · 7 of 7 slices shown]
[im 1/7]
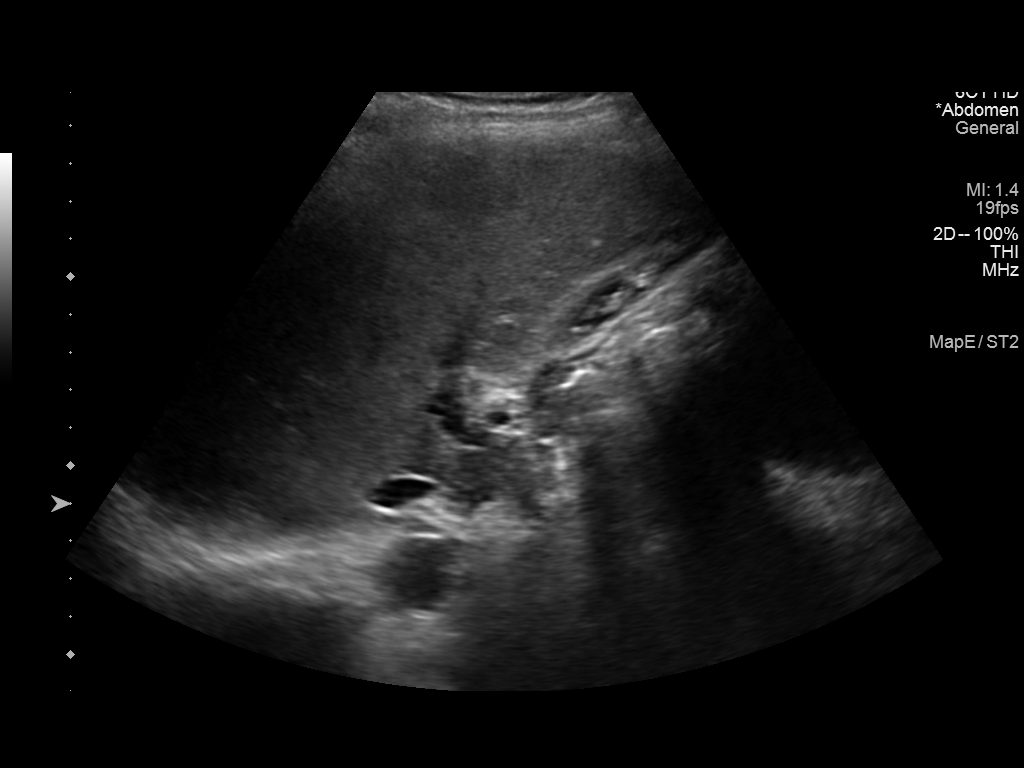
[im 2/7]
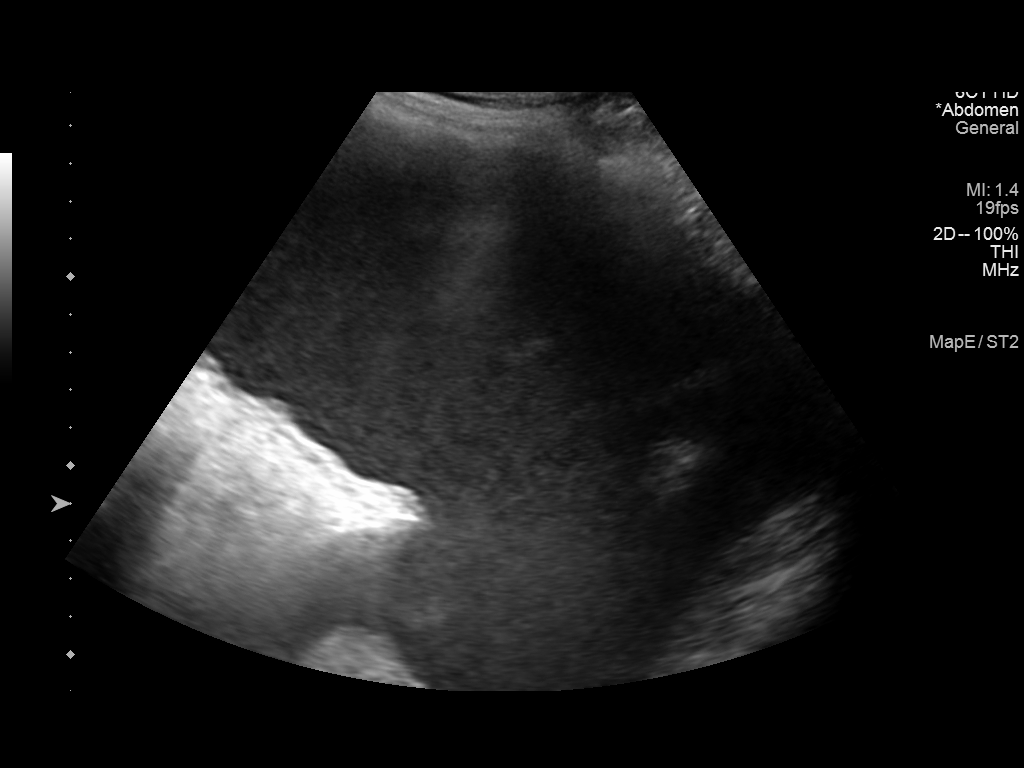
[im 3/7]
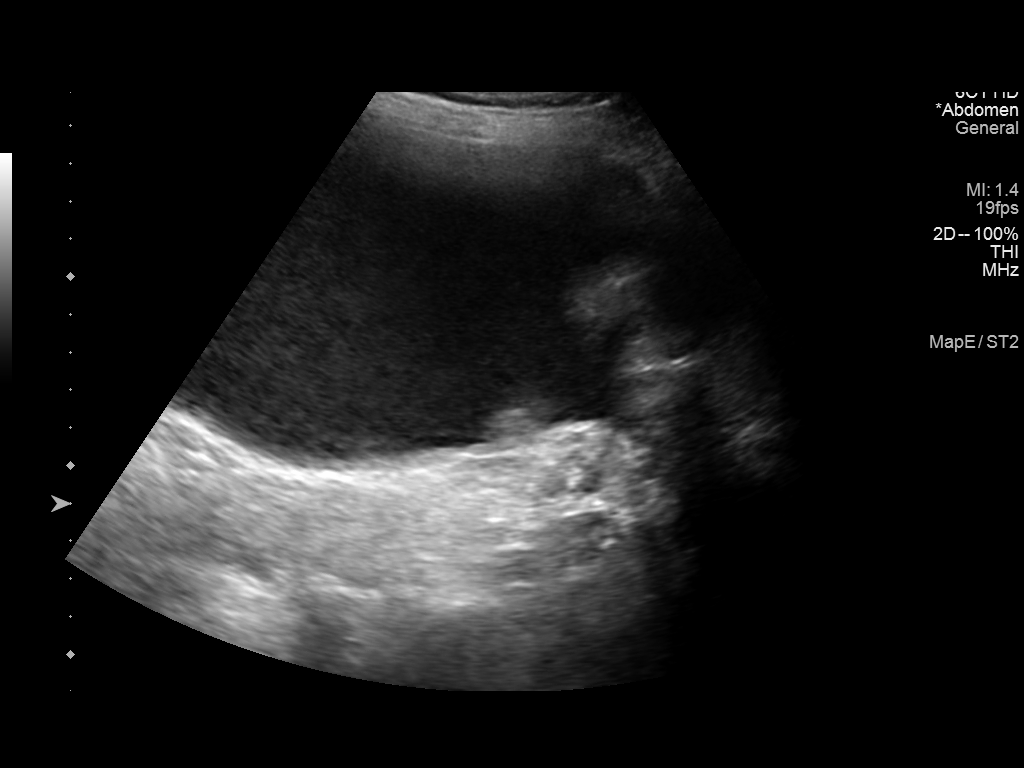
[im 4/7]
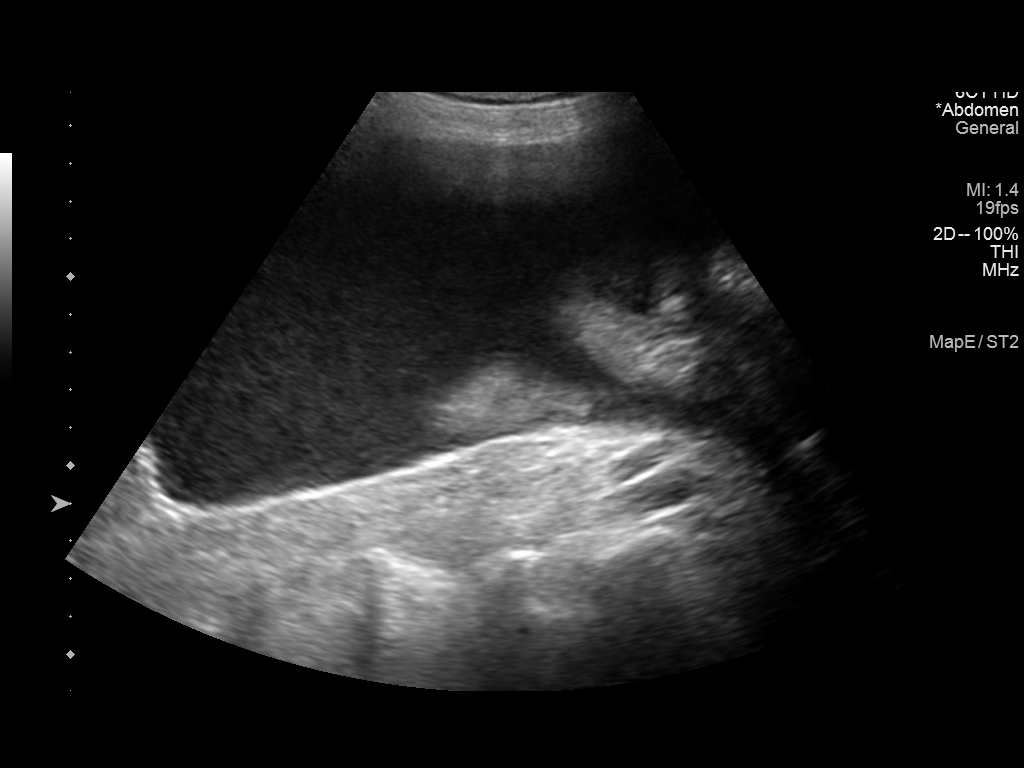
[im 5/7]
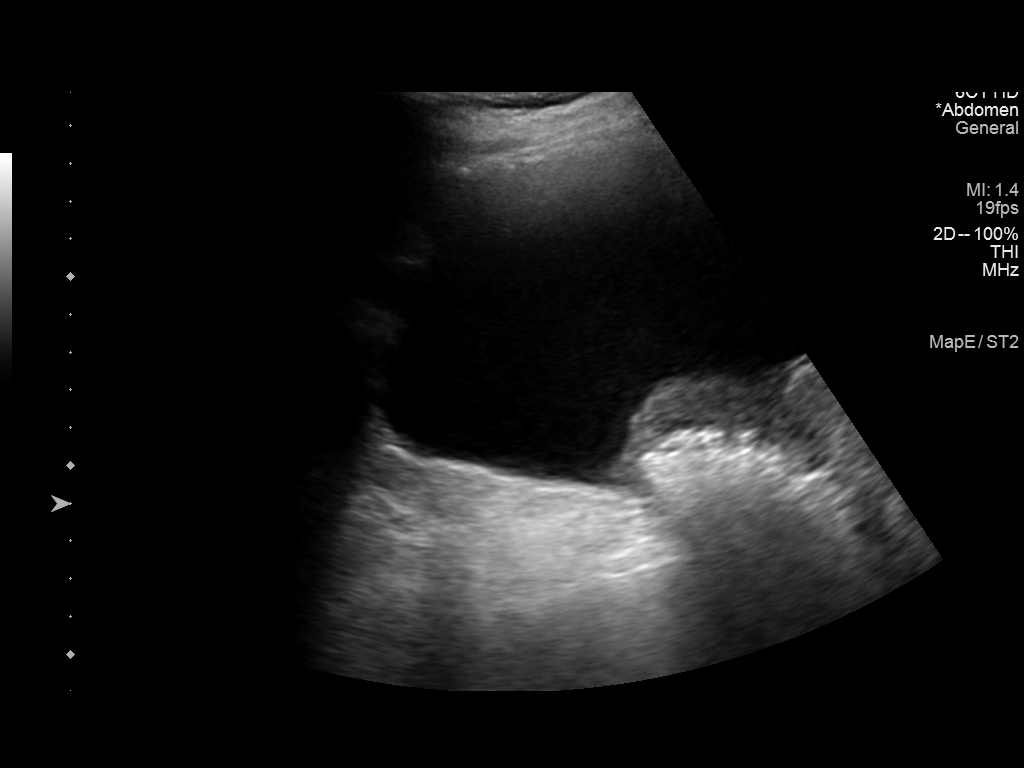
[im 6/7]
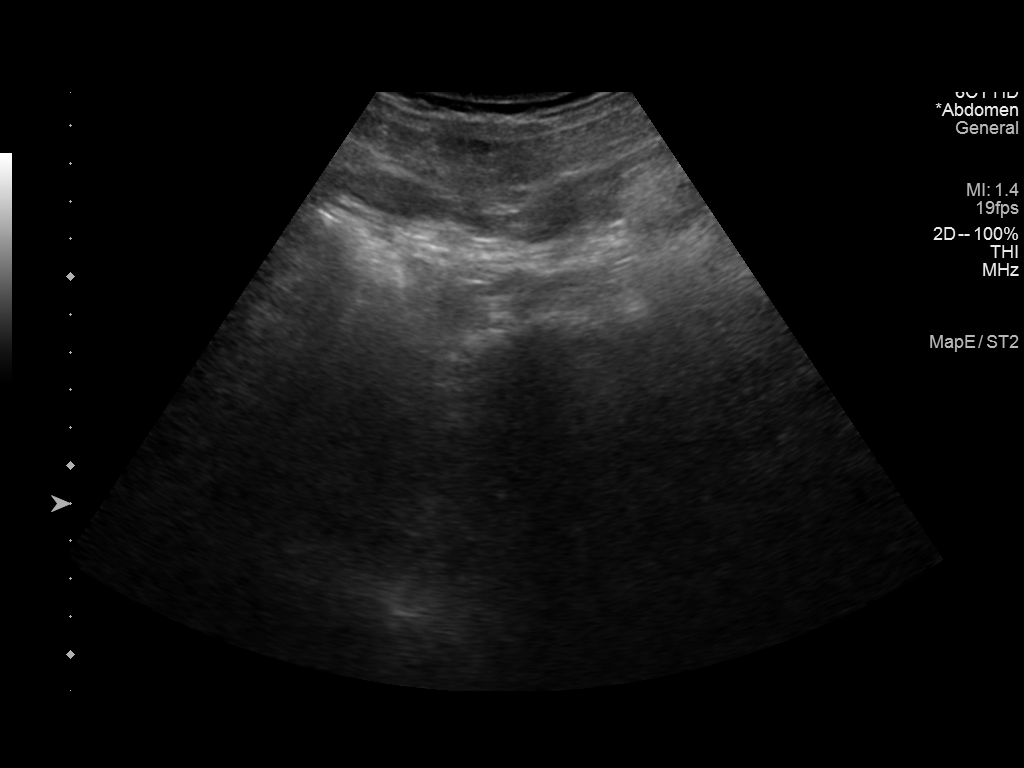
[im 7/7]
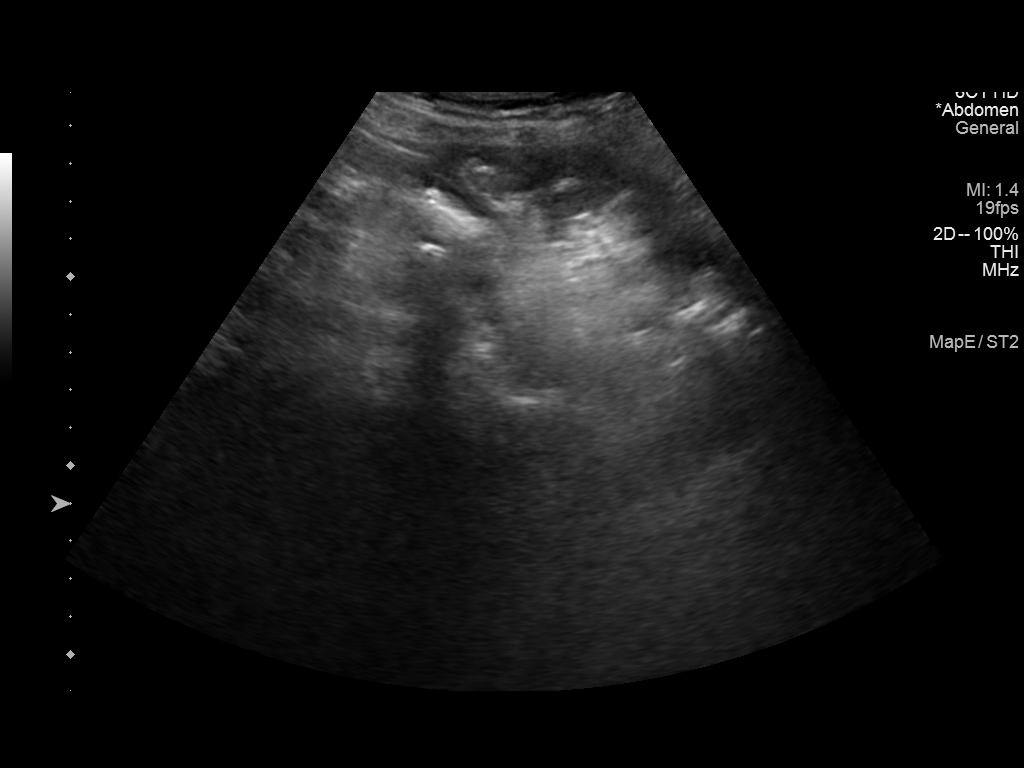

[7 of 7 positions shown; findings below may reference images not displayed]

EXAM:
ULTRASOUND GUIDED RIGHT LOWER QUADRANT PARACENTESIS

MEDICATIONS:
None.

COMPLICATIONS:
None immediate.

PROCEDURE:
Informed written consent was obtained from the patient after a
discussion of the risks, benefits and alternatives to treatment. A
timeout was performed prior to the initiation of the procedure.

Initial ultrasound scanning demonstrates a large amount of ascites
within the right lower abdominal quadrant. The right lower abdomen
was prepped and draped in the usual sterile fashion. 1% lidocaine
with epinephrine was used for local anesthesia.

Following this, a Safe-T-Centesis catheter was introduced. An
ultrasound image was saved for documentation purposes. The
paracentesis was performed. The catheter was removed and a dressing
was applied. The patient tolerated the procedure well without
immediate post procedural complication.
FINDINGS: A total of approximately 2.7 L of blood-tinged fluid was removed.
IMPRESSION: Successful ultrasound-guided paracentesis yielding 2.7 liters of
peritoneal fluid.

## 2017-08-11 IMAGING — US US PARACENTESIS
1 series · 8 of 8 positions shown · non-contrast
Comparison: none

INDICATION: Ovarian cancer, recurrent ascites. Request made for therapeutic
paracentesis.

[Series 1: us paracentesis · 0.26mm/px · 8 of 8 slices shown]
[im 1/8]
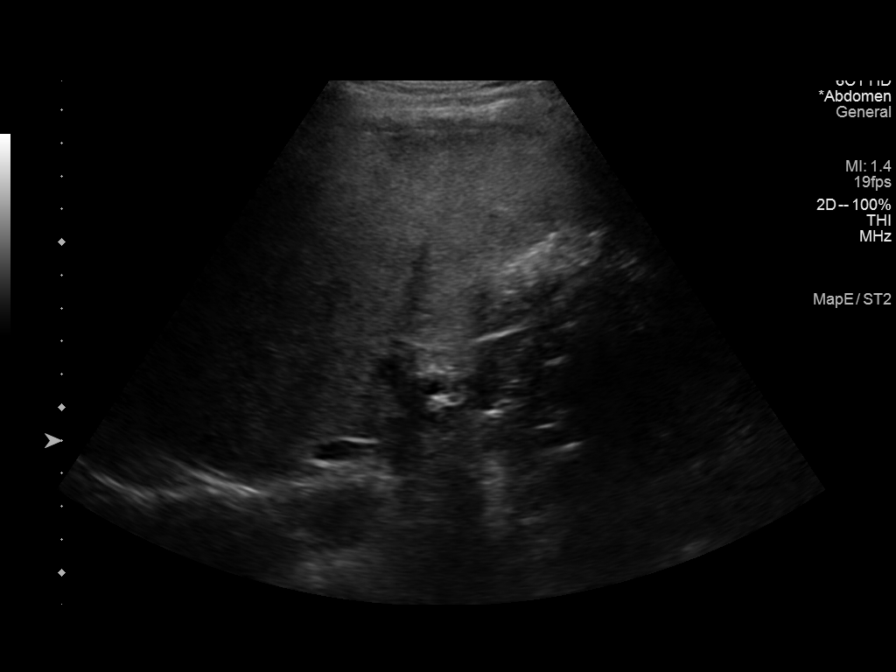
[im 2/8]
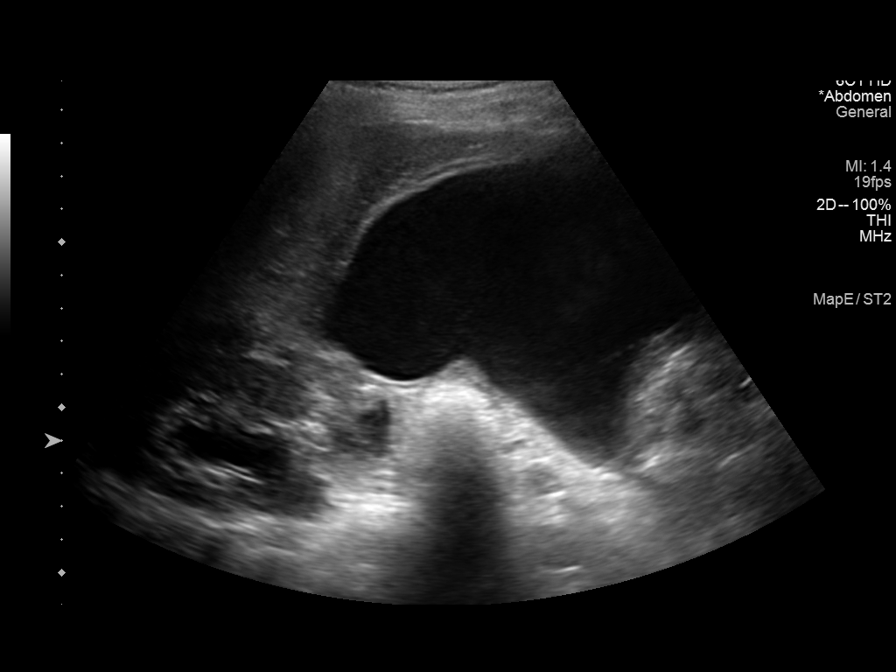
[im 3/8]
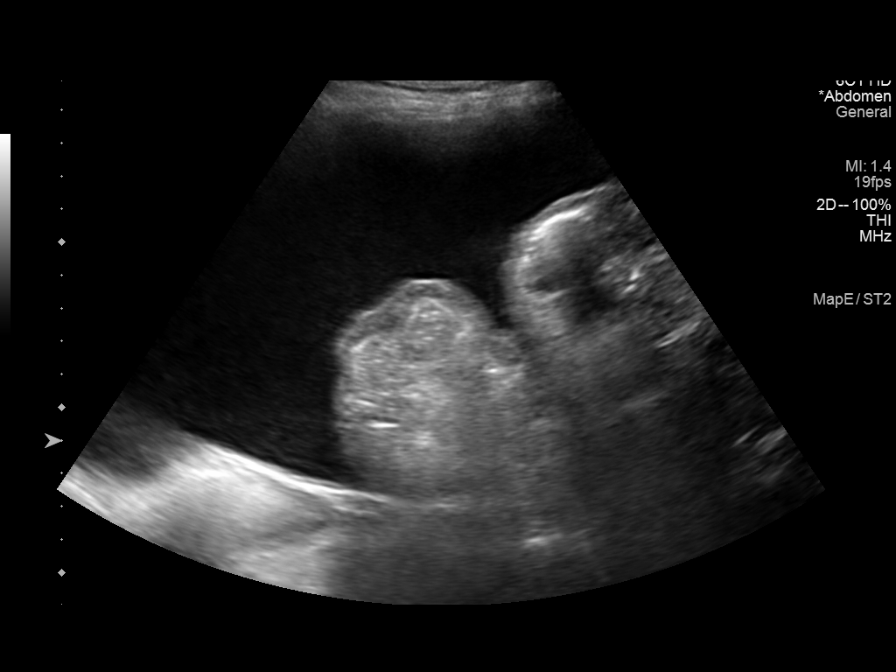
[im 4/8]
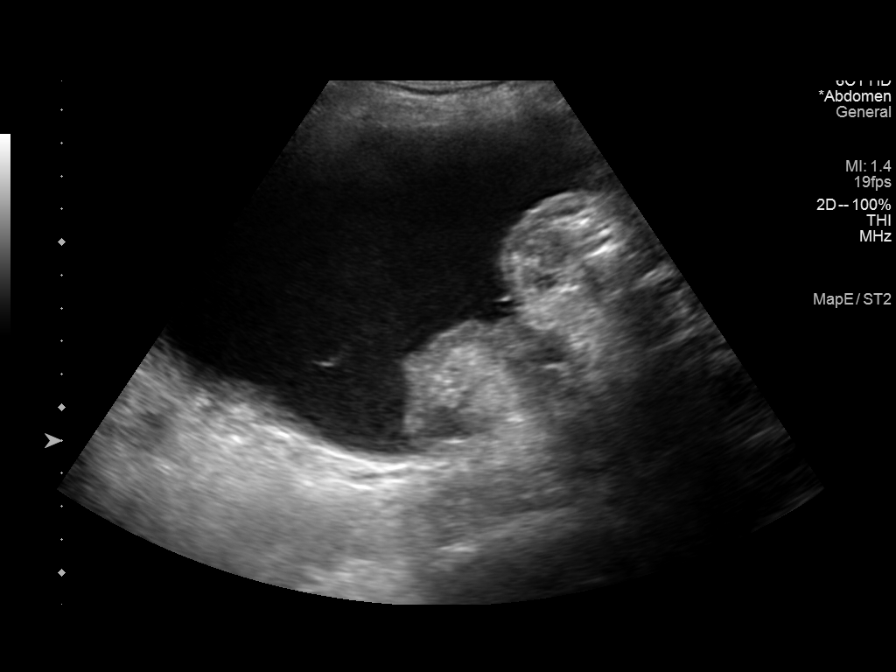
[im 5/8]
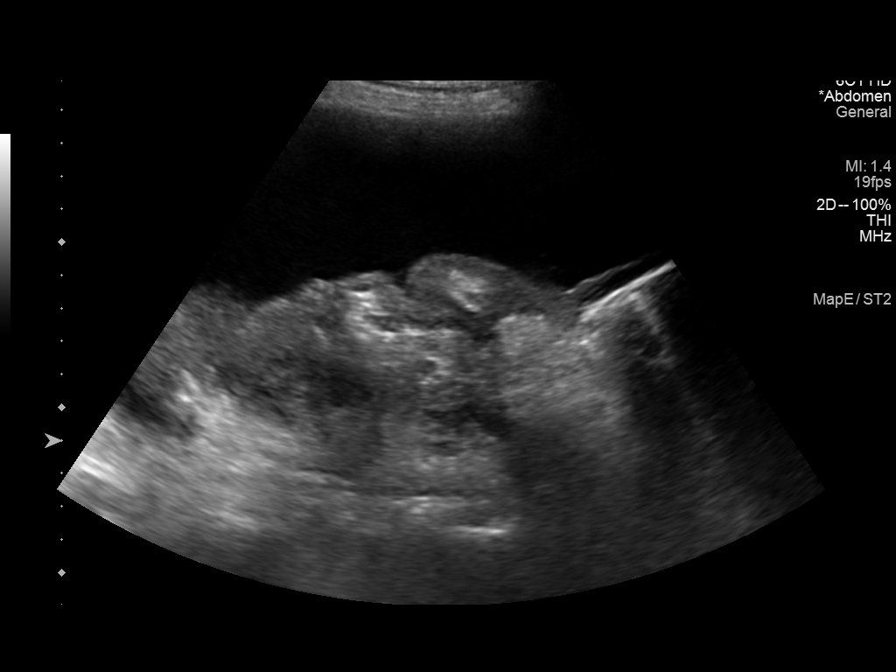
[im 6/8]
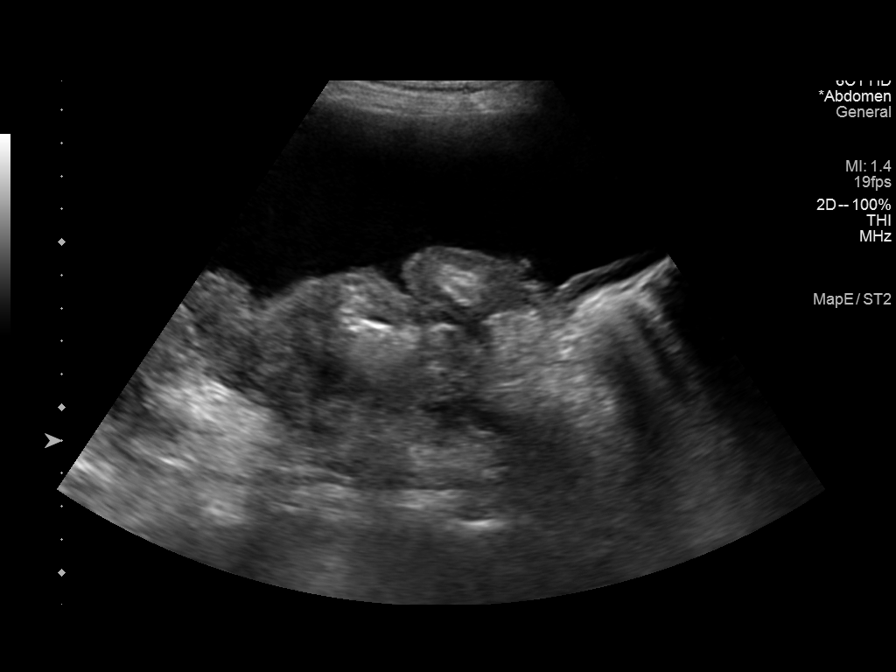
[im 7/8]
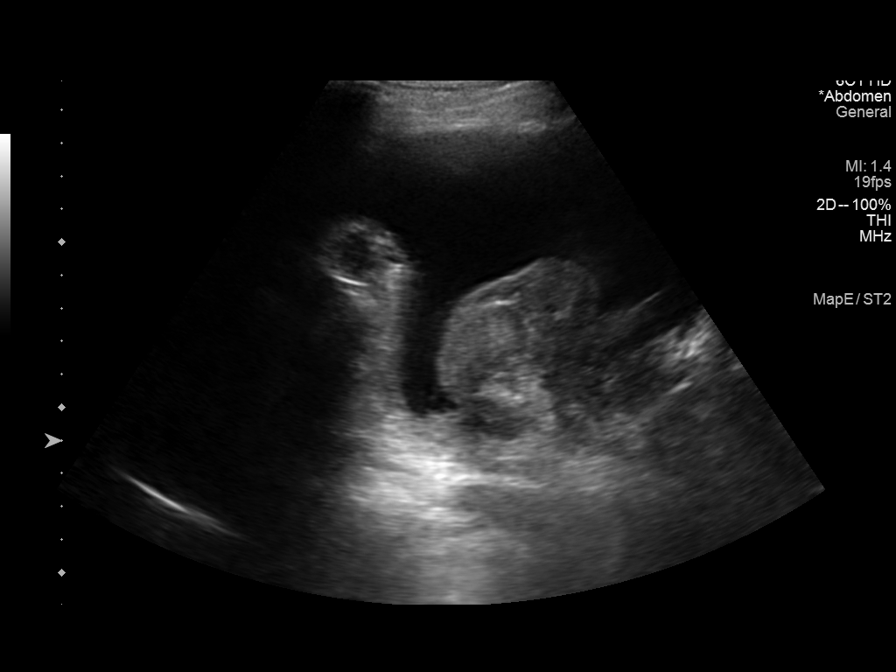
[im 8/8]
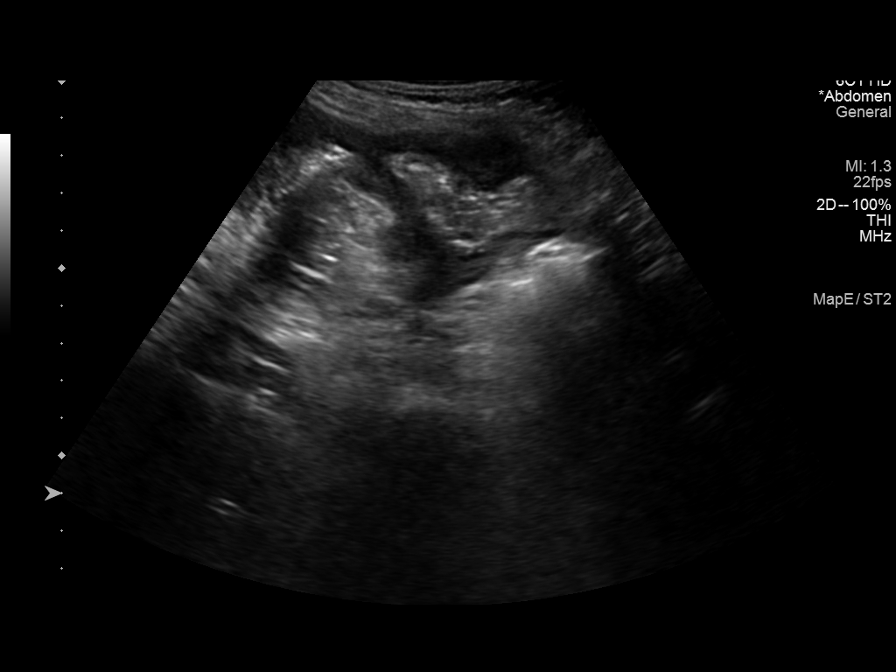

[8 of 8 positions shown; findings below may reference images not displayed]

EXAM:
ULTRASOUND GUIDED THERAPEUTIC PARACENTESIS

MEDICATIONS:
None.

COMPLICATIONS:
None immediate.

PROCEDURE:
Informed written consent was obtained from the patient after a
discussion of the risks, benefits and alternatives to treatment. A
timeout was performed prior to the initiation of the procedure.

Initial ultrasound scanning demonstrates a moderate amount of
ascites within the right mid to lower abdominal quadrant. The right
mid to lower abdomen was prepped and draped in the usual sterile
fashion. 1% lidocaine was used for local anesthesia.

Following this, a Yueh catheter was introduced. An ultrasound image
was saved for documentation purposes. The paracentesis was
performed. The catheter was removed and a dressing was applied. The
patient tolerated the procedure well without immediate post
procedural complication.
FINDINGS: A total of approximately 2.4 liters of blood-tinged fluid was
removed.
IMPRESSION: Successful ultrasound-guided therapeutic paracentesis yielding
liters liters of peritoneal fluid.
